# Patient Record
Sex: Male | Born: 1935 | Race: White | Hispanic: No | Smoking: Current every day smoker
Health system: Southern US, Community
[De-identification: ages and names within clinical notes are randomized; demographics above are authoritative.]

## PROBLEM LIST (undated history)

## (undated) DIAGNOSIS — I10 Essential (primary) hypertension: Secondary | ICD-10-CM

## (undated) DIAGNOSIS — R0602 Shortness of breath: Secondary | ICD-10-CM

## (undated) DIAGNOSIS — I2510 Atherosclerotic heart disease of native coronary artery without angina pectoris: Secondary | ICD-10-CM

## (undated) DIAGNOSIS — I82409 Acute embolism and thrombosis of unspecified deep veins of unspecified lower extremity: Secondary | ICD-10-CM

## (undated) DIAGNOSIS — E785 Hyperlipidemia, unspecified: Secondary | ICD-10-CM

## (undated) DIAGNOSIS — K219 Gastro-esophageal reflux disease without esophagitis: Secondary | ICD-10-CM

## (undated) DIAGNOSIS — N189 Chronic kidney disease, unspecified: Secondary | ICD-10-CM

## (undated) DIAGNOSIS — I4892 Unspecified atrial flutter: Secondary | ICD-10-CM

## (undated) HISTORY — PX: SECONDARY CLOSURE ARM: SHX5151

## (undated) HISTORY — DX: Acute embolism and thrombosis of unspecified deep veins of unspecified lower extremity: I82.409

## (undated) HISTORY — PX: CORONARY ARTERY BYPASS GRAFT: SHX141

## (undated) HISTORY — DX: Unspecified atrial flutter: I48.92

## (undated) HISTORY — PX: OTHER SURGICAL HISTORY: SHX169

## (undated) HISTORY — PX: CARDIAC CATHETERIZATION: SHX172

## (undated) HISTORY — DX: Atherosclerotic heart disease of native coronary artery without angina pectoris: I25.10

## (undated) HISTORY — DX: Essential (primary) hypertension: I10

## (undated) HISTORY — DX: Hyperlipidemia, unspecified: E78.5

---

## 1998-08-22 ENCOUNTER — Ambulatory Visit (HOSPITAL_BASED_OUTPATIENT_CLINIC_OR_DEPARTMENT_OTHER): Admission: RE | Admit: 1998-08-22 | Discharge: 1998-08-22 | Payer: Self-pay

## 2000-12-18 ENCOUNTER — Ambulatory Visit (HOSPITAL_COMMUNITY): Admission: RE | Admit: 2000-12-18 | Discharge: 2000-12-18 | Payer: Self-pay | Admitting: Gastroenterology

## 2000-12-18 ENCOUNTER — Encounter: Payer: Self-pay | Admitting: Gastroenterology

## 2001-01-01 ENCOUNTER — Ambulatory Visit (HOSPITAL_COMMUNITY): Admission: RE | Admit: 2001-01-01 | Discharge: 2001-01-01 | Payer: Self-pay | Admitting: Gastroenterology

## 2001-01-01 ENCOUNTER — Encounter: Payer: Self-pay | Admitting: Gastroenterology

## 2001-11-09 ENCOUNTER — Encounter: Admission: RE | Admit: 2001-11-09 | Discharge: 2001-11-09 | Payer: Self-pay | Admitting: Orthopaedic Surgery

## 2001-11-09 ENCOUNTER — Encounter: Payer: Self-pay | Admitting: Orthopaedic Surgery

## 2001-11-10 ENCOUNTER — Ambulatory Visit (HOSPITAL_BASED_OUTPATIENT_CLINIC_OR_DEPARTMENT_OTHER): Admission: RE | Admit: 2001-11-10 | Discharge: 2001-11-10 | Payer: Self-pay | Admitting: Orthopaedic Surgery

## 2003-07-07 ENCOUNTER — Ambulatory Visit (HOSPITAL_COMMUNITY): Admission: RE | Admit: 2003-07-07 | Discharge: 2003-07-07 | Payer: Self-pay | Admitting: Gastroenterology

## 2004-01-16 ENCOUNTER — Encounter: Admission: RE | Admit: 2004-01-16 | Discharge: 2004-01-16 | Payer: Self-pay | Admitting: Family Medicine

## 2004-03-21 ENCOUNTER — Ambulatory Visit (HOSPITAL_COMMUNITY): Admission: RE | Admit: 2004-03-21 | Discharge: 2004-03-21 | Payer: Self-pay | Admitting: Gastroenterology

## 2007-01-07 ENCOUNTER — Encounter: Admission: RE | Admit: 2007-01-07 | Discharge: 2007-01-07 | Payer: Self-pay | Admitting: Family Medicine

## 2007-04-20 ENCOUNTER — Inpatient Hospital Stay (HOSPITAL_COMMUNITY): Admission: EM | Admit: 2007-04-20 | Discharge: 2007-04-22 | Payer: Self-pay | Admitting: Emergency Medicine

## 2007-04-20 ENCOUNTER — Ambulatory Visit: Payer: Self-pay | Admitting: Cardiology

## 2007-05-13 ENCOUNTER — Ambulatory Visit: Payer: Self-pay | Admitting: Cardiology

## 2007-05-26 ENCOUNTER — Ambulatory Visit: Payer: Self-pay | Admitting: Cardiology

## 2007-05-26 LAB — CONVERTED CEMR LAB
AST: 22 units/L (ref 0–37)
Albumin: 3.3 g/dL — ABNORMAL LOW (ref 3.5–5.2)
Alkaline Phosphatase: 60 units/L (ref 39–117)
Bilirubin, Direct: 0.1 mg/dL (ref 0.0–0.3)
HDL: 31.3 mg/dL — ABNORMAL LOW (ref >39.0)
Total Bilirubin: 0.5 mg/dL (ref 0.3–1.2)
Total Protein: 7.8 g/dL (ref 6.0–8.3)
Triglycerides: 57 mg/dL (ref 0–149)

## 2007-08-12 ENCOUNTER — Ambulatory Visit: Payer: Self-pay | Admitting: Cardiology

## 2007-08-12 LAB — CONVERTED CEMR LAB
Bilirubin, Direct: 0.1 mg/dL (ref 0.0–0.3)
HDL: 29.4 mg/dL — ABNORMAL LOW (ref >39.0)
Total CHOL/HDL Ratio: 4.7
Total Protein: 7.7 g/dL (ref 6.0–8.3)
Triglycerides: 69 mg/dL (ref 0–149)

## 2007-09-30 ENCOUNTER — Ambulatory Visit: Payer: Self-pay | Admitting: Cardiology

## 2007-09-30 LAB — CONVERTED CEMR LAB
ALT: 13 units/L (ref 0–53)
AST: 21 units/L (ref 0–37)
Albumin: 3.6 g/dL (ref 3.5–5.2)
Bilirubin, Direct: 0.1 mg/dL (ref 0.0–0.3)
Cholesterol: 106 mg/dL (ref 0–200)
LDL Cholesterol: 60 mg/dL (ref 0–99)
Total CHOL/HDL Ratio: 3.4
Total Protein: 7.6 g/dL (ref 6.0–8.3)

## 2007-11-11 ENCOUNTER — Ambulatory Visit: Payer: Self-pay | Admitting: Cardiology

## 2007-11-12 ENCOUNTER — Ambulatory Visit: Admission: RE | Admit: 2007-11-12 | Discharge: 2007-11-12 | Payer: Self-pay | Admitting: Cardiology

## 2007-11-12 ENCOUNTER — Ambulatory Visit: Payer: Self-pay

## 2007-11-12 ENCOUNTER — Ambulatory Visit: Payer: Self-pay | Admitting: Internal Medicine

## 2007-11-13 ENCOUNTER — Ambulatory Visit: Payer: Self-pay | Admitting: Cardiology

## 2007-11-13 LAB — CONVERTED CEMR LAB
BUN: 13 mg/dL (ref 6–23)
CO2: 25 meq/L (ref 19–32)
Creatinine, Ser: 1 mg/dL (ref 0.4–1.5)
Eosinophils Relative: 2.5 % (ref 0.0–5.0)
GFR calc Af Amer: 95 mL/min
HCT: 41.8 % (ref 39.0–52.0)
Lymphocytes Relative: 30.9 % (ref 12.0–46.0)
MCV: 89.8 fL (ref 78.0–100.0)
Potassium: 4 meq/L (ref 3.5–5.1)
RDW: 14.7 % — ABNORMAL HIGH (ref 11.5–14.6)
Sodium: 141 meq/L (ref 135–145)
aPTT: 27.3 s (ref 21.7–29.8)

## 2007-11-18 ENCOUNTER — Ambulatory Visit (HOSPITAL_COMMUNITY): Admission: RE | Admit: 2007-11-18 | Discharge: 2007-11-19 | Payer: Self-pay | Admitting: Internal Medicine

## 2007-11-18 ENCOUNTER — Ambulatory Visit: Payer: Self-pay | Admitting: Internal Medicine

## 2007-11-27 ENCOUNTER — Ambulatory Visit: Payer: Self-pay

## 2007-12-03 ENCOUNTER — Ambulatory Visit: Payer: Self-pay | Admitting: Cardiology

## 2007-12-04 ENCOUNTER — Ambulatory Visit: Payer: Self-pay

## 2007-12-04 ENCOUNTER — Ambulatory Visit: Payer: Self-pay | Admitting: Cardiology

## 2007-12-04 LAB — CONVERTED CEMR LAB
ALT: 11 U/L
AST: 17 U/L
Albumin: 3.6 g/dL
Alkaline Phosphatase: 53 U/L
Bilirubin, Direct: 0.1 mg/dL
Cholesterol: 122 mg/dL
HDL: 30.7 mg/dL — ABNORMAL LOW
LDL Cholesterol: 74 mg/dL
TSH: 1.4 u[IU]/mL
Total Bilirubin: 1 mg/dL
Total CHOL/HDL Ratio: 4
Total Protein: 7.5 g/dL
Triglycerides: 87 mg/dL
VLDL: 17 mg/dL

## 2008-08-22 ENCOUNTER — Ambulatory Visit: Payer: Self-pay | Admitting: Cardiology

## 2008-08-24 ENCOUNTER — Ambulatory Visit: Payer: Self-pay | Admitting: Cardiology

## 2008-08-24 LAB — CONVERTED CEMR LAB
AST: 19 units/L (ref 0–37)
Alkaline Phosphatase: 61 units/L (ref 39–117)
Cholesterol: 131 mg/dL (ref 0–200)
LDL Cholesterol: 88 mg/dL (ref 0–99)
Total Bilirubin: 0.8 mg/dL (ref 0.3–1.2)
VLDL: 17 mg/dL (ref 0–40)

## 2008-10-12 ENCOUNTER — Ambulatory Visit: Payer: Self-pay | Admitting: Cardiology

## 2008-10-12 LAB — CONVERTED CEMR LAB
ALT: 14 units/L (ref 0–53)
AST: 19 units/L (ref 0–37)
Alkaline Phosphatase: 61 units/L (ref 39–117)
Bilirubin, Direct: 0.1 mg/dL (ref 0.0–0.3)
Cholesterol: 118 mg/dL (ref 0–200)
Total Bilirubin: 0.9 mg/dL (ref 0.3–1.2)
Total CHOL/HDL Ratio: 4
Total Protein: 7.6 g/dL (ref 6.0–8.3)
Triglycerides: 73 mg/dL (ref 0.0–149.0)
VLDL: 14.6 mg/dL (ref 0.0–40.0)

## 2008-10-17 ENCOUNTER — Encounter (INDEPENDENT_AMBULATORY_CARE_PROVIDER_SITE_OTHER): Payer: Self-pay | Admitting: *Deleted

## 2008-11-19 DIAGNOSIS — I2510 Atherosclerotic heart disease of native coronary artery without angina pectoris: Secondary | ICD-10-CM

## 2008-11-19 DIAGNOSIS — I10 Essential (primary) hypertension: Secondary | ICD-10-CM | POA: Insufficient documentation

## 2008-11-19 DIAGNOSIS — E785 Hyperlipidemia, unspecified: Secondary | ICD-10-CM | POA: Insufficient documentation

## 2008-11-19 DIAGNOSIS — I82409 Acute embolism and thrombosis of unspecified deep veins of unspecified lower extremity: Secondary | ICD-10-CM | POA: Insufficient documentation

## 2008-11-19 DIAGNOSIS — I4892 Unspecified atrial flutter: Secondary | ICD-10-CM | POA: Insufficient documentation

## 2008-11-19 HISTORY — DX: Atherosclerotic heart disease of native coronary artery without angina pectoris: I25.10

## 2009-02-14 ENCOUNTER — Encounter: Admission: RE | Admit: 2009-02-14 | Discharge: 2009-02-14 | Payer: Self-pay | Admitting: Internal Medicine

## 2009-07-14 ENCOUNTER — Encounter: Admission: RE | Admit: 2009-07-14 | Discharge: 2009-07-14 | Payer: Self-pay | Admitting: Internal Medicine

## 2010-01-11 ENCOUNTER — Ambulatory Visit: Payer: Self-pay | Admitting: Cardiology

## 2010-01-11 DIAGNOSIS — F17210 Nicotine dependence, cigarettes, uncomplicated: Secondary | ICD-10-CM | POA: Insufficient documentation

## 2010-01-11 DIAGNOSIS — R0989 Other specified symptoms and signs involving the circulatory and respiratory systems: Secondary | ICD-10-CM | POA: Insufficient documentation

## 2010-02-07 ENCOUNTER — Ambulatory Visit: Payer: Self-pay

## 2010-02-07 ENCOUNTER — Ambulatory Visit: Payer: Self-pay | Admitting: Cardiology

## 2010-04-23 ENCOUNTER — Encounter: Admission: RE | Admit: 2010-04-23 | Discharge: 2010-04-23 | Payer: Self-pay | Admitting: Internal Medicine

## 2010-07-14 ENCOUNTER — Encounter: Payer: Self-pay | Admitting: Gastroenterology

## 2010-07-22 LAB — CONVERTED CEMR LAB
Albumin: 3.7 g/dL (ref 3.5–5.2)
BUN: 12 mg/dL (ref 6–23)
Bilirubin, Direct: 0.1 mg/dL (ref 0.0–0.3)
Chloride: 104 meq/L (ref 96–112)
Cholesterol: 207 mg/dL — ABNORMAL HIGH (ref 0–200)
Creatinine, Ser: 1.1 mg/dL (ref 0.4–1.5)
Direct LDL: 168 mg/dL
GFR calc non Af Amer: 67.56 mL/min (ref >60)
Total CHOL/HDL Ratio: 7
Triglycerides: 109 mg/dL (ref 0.0–149.0)
VLDL: 21.8 mg/dL (ref 0.0–40.0)

## 2010-07-24 NOTE — Assessment & Plan Note (Signed)
Summary: rov/ gd  Medications Added ASPIRIN 325 MG  TABS (ASPIRIN) 1 tab by mouth once daily OMEPRAZOLE 20 MG CPDR (OMEPRAZOLE) 1 tab by mouth once daily NIASPAN 1000 MG CR-TABS (NIACIN (ANTIHYPERLIPIDEMIC)) 1 tab  by mouth once daily SIMVASTATIN 40 MG TABS (SIMVASTATIN) Take one tablet by mouth daily at bedtime        CC:  pt complains of pain on his side which radiates to his neck...pt didnot bring med list.  History of Present Illness: [name] is a pleasant  gentleman with a history of coronary artery disease, status post coronary bypassing graft.  This was performed in 1998.  His last catheterization in October 2008 showed a patent LIMA to the LAD and a patent saphenous vein graft to the right coronary artery.  However, he did have significant left main disease that bifurcated into the circumflex.  He had a drug-eluting stent to his left main at that time. His last Myoview was performed on November 27, 2007.  At that time, his ejection fraction was 54% and there was no scar or ischemia.  He also has a history of SVT and atrial flutter ablation.Since I last saw him March 2010, the patient has dyspnea with more extreme activities but not with routine activities. It is relieved with rest. It is not associated with chest pain. There is no orthopnea, PND or pedal edema. There is no syncope or palpitations. There is no exertional chest pain.   Current Medications (verified): 1)  Metoprolol Succinate 25 Mg Xr24h-Tab (Metoprolol Succinate) .... 1/2 Tab Once Daily 2)  Plavix 75 Mg Tabs (Clopidogrel Bisulfate) .... Take One Tablet By Mouth Daily 3)  Aspirin 325 Mg  Tabs (Aspirin) .Marland Kitchen.. 1 Tab By Mouth Once Daily 4)  Omeprazole 20 Mg Cpdr (Omeprazole) .Marland Kitchen.. 1 Tab By Mouth Once Daily 5)  Niaspan 1000 Mg Cr-Tabs (Niacin (Antihyperlipidemic)) .Marland Kitchen.. 1 Tab  By Mouth Once Daily 6)  Simvastatin 40 Mg Tabs (Simvastatin) .... Take One Tablet By Mouth Daily At Bedtime  Past History:  Past Medical  History: ATRIAL FLUTTER (ICD-427.32) and SVT that is post ablation DVT (ICD-453.40) DYSLIPIDEMIA (ICD-272.4) CAD (ICD-414.00) HYPERTENSION (ICD-401.9)    Past Surgical History: Reviewed history from 11/19/2008 and no changes required.  Status post cardiac catheterization and bypass   surgery as well as EGD with dilatation x3, left lower extremity vein   stripping and left knee arthroscopy.      Social History: Reviewed history from 11/19/2008 and no changes required.  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  He has a greater than 50 pack-year history of tobacco use   and denies alcohol or drug abuse.      Review of Systems       no fevers or chills, productive cough, hemoptysis, dysphasia, odynophagia, melena, hematochezia, dysuria, hematuria, rash, seizure activity, orthopnea, PND, pedal edema, claudication. Remaining systems are negative.   Vital Signs:  Patient profile:   76 year old male Height:      62 inches Weight:      184 pounds BMI:     33.78 Pulse rate:   67 / minute Resp:     14 per minute BP sitting:   128 / 64  (left arm)  Vitals Entered By: Kem Parkinson (January 11, 2010 9:33 AM)  Physical Exam  General:  Well-developed well-nourished in no acute distress.  Skin  is warm and dry.  HEENT is normal.  Neck is supple. No thyromegaly.  Chest is clear to auscultation with normal expansion.  Cardiovascular exam is regular rate and rhythm.  Abdominal exam nontender or distended. No masses palpated. positive bruit Extremities show no edema. neuro grossly intact    EKG  Procedure date:  01/11/2010  Findings:      Sinus rhythm at a rate of 67. Axis normal. No ST changes.  Impression & Recommendations:  Problem # 1:  ATRIAL FLUTTER (ICD-427.32)  Status post ablation. Note SVT has been ablated previously as well. His updated medication list for this  problem includes:    Metoprolol Succinate 25 Mg Xr24h-tab (Metoprolol succinate) .Marland Kitchen... 1/2 tab once daily    Plavix 75 Mg Tabs (Clopidogrel bisulfate) .Marland Kitchen... Take one tablet by mouth daily    Aspirin 325 Mg Tabs (Aspirin) .Marland Kitchen... 1 tab by mouth once daily  His updated medication list for this problem includes:    Metoprolol Succinate 25 Mg Xr24h-tab (Metoprolol succinate) .Marland Kitchen... 1/2 tab once daily    Plavix 75 Mg Tabs (Clopidogrel bisulfate) .Marland Kitchen... Take one tablet by mouth daily    Aspirin 325 Mg Tabs (Aspirin) .Marland Kitchen... 1 tab by mouth once daily  Problem # 2:  DYSLIPIDEMIA (ICD-272.4)  Continue present medications. Check lipids and liver. His updated medication list for this problem includes:    Niaspan 1000 Mg Cr-tabs (Niacin (antihyperlipidemic)) .Marland Kitchen... 1 tab  by mouth once daily    Simvastatin 40 Mg Tabs (Simvastatin) .Marland Kitchen... Take one tablet by mouth daily at bedtime  His updated medication list for this problem includes:    Niaspan 1000 Mg Cr-tabs (Niacin (antihyperlipidemic)) .Marland Kitchen... 1 tab  by mouth once daily    Simvastatin 40 Mg Tabs (Simvastatin) .Marland Kitchen... Take one tablet by mouth daily at bedtime  Problem # 3:  CAD (ICD-414.00)  Continue aspirin, Plavix, beta blocker and statin. His updated medication list for this problem includes:    Metoprolol Succinate 25 Mg Xr24h-tab (Metoprolol succinate) .Marland Kitchen... 1/2 tab once daily    Plavix 75 Mg Tabs (Clopidogrel bisulfate) .Marland Kitchen... Take one tablet by mouth daily    Aspirin 325 Mg Tabs (Aspirin) .Marland Kitchen... 1 tab by mouth once daily  His updated medication list for this problem includes:    Metoprolol Succinate 25 Mg Xr24h-tab (Metoprolol succinate) .Marland Kitchen... 1/2 tab once daily    Plavix 75 Mg Tabs (Clopidogrel bisulfate) .Marland Kitchen... Take one tablet by mouth daily    Aspirin 325 Mg Tabs (Aspirin) .Marland Kitchen... 1 tab by mouth once daily  Orders: EKG w/ Interpretation (93000) Abdominal Aorta Duplex (Abd Aorta Duplex)  Problem # 4:  HYPERTENSION (ICD-401.9)  Blood pressure  controlled on present medications. Will continue. Check potassium and renal function. His updated medication list for this problem includes:    Metoprolol Succinate 25 Mg Xr24h-tab (Metoprolol succinate) .Marland Kitchen... 1/2 tab once daily    Aspirin 325 Mg Tabs (Aspirin) .Marland Kitchen... 1 tab by mouth once daily  His updated medication list for this problem includes:    Metoprolol Succinate 25 Mg Xr24h-tab (Metoprolol succinate) .Marland Kitchen... 1/2 tab once daily    Aspirin 325 Mg Tabs (Aspirin) .Marland Kitchen... 1 tab by mouth once daily  Orders: EKG w/ Interpretation (93000)  Problem # 5:  ABDOMINAL BRUIT (ICD-785.9)  Check abdominal ultrasound to exclude aneurysm.  Orders: Abdominal Aorta Duplex (Abd Aorta Duplex)  Problem # 6:  TOBACCO ABUSE (ICD-305.1) Patient counseled on discontinuing.  Patient Instructions: 1)  Your physician recommends that you schedule a follow-up  appointment in: 1 year with Dr. Jens Som 2)  Your physician recommends that you return for a FASTING lipid profile, liver, and BMET     414.01, 272.0, 401.1 SAME DAY AS YOUR ABDOMINAL US 3)  Your physician has requested that you have an abdominal aorta duplex. During this test, an ultrasound is used to evaluate the aorta. Allow 30 minutes for this exam. Do not eat after midnight the day before and avoid carbonated beverages. There are no restrictions or special instructions. 4)  Your physician recommends that you continue on your current medications as directed. Please refer to the Current Medication list given to you today.

## 2010-11-06 NOTE — H&P (Signed)
NAME:  TAIWO, FISH NO.:  [id number]   MEDICAL RECORD NO.:  [id number]          PATIENT TYPE:  OIB   LOCATION:  2899                         FACILITY:  [facility]   PHYSICIAN:  Duke Salvia, MD, FACCDATE OF BIRTH:  03/04/1935   DATE OF ADMISSION:  11/12/2007  DATE OF DISCHARGE:                              HISTORY & PHYSICAL   [name] is a 76 year old gentleman who is seen by Dr. Jens Som.  He  has ischemic heart disease with prior bypass surgery and underwent  catheterization in 2008.  At that time, his vein graft to his RCA was  patent and LIMA to his LAD was patent.  His ejection fraction was  normal.  A drug-eluting stent was placed in his high-grade left main to  presumably improve circumflex flow.   He was having problems with chest pain and was scheduled for Myoview  scanning today.  On arrival, he was found to be in atrial flutter with  2:1 conduction at a rate of 145-150 beats per minute.  It was without  associated palpitations; he did note some increasing shortness of  breath.   He has a history of resting bradycardia.   MEDICATIONS:  1. Toprol 12.5.  2. Aspirin 325.  3. Zocor.  4. Plavix.  5. Niaspan.  6. Omeprazole.   ALLERGIES:  He has no known drug allergies.   SOCIAL HISTORY:  He is married.  He continues to smoke (some).  He does  not use alcohol.  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   CURRENT MEDICATIONS:  As listed previously.   PHYSICAL EXAMINATION:  GENERAL:  He is an elderly Caucasian male  appearing his stated age of 83.  VITAL SIGNS:  His blood pressure was 110/60, his pulse was 157.  HEENT:  Demonstrated no icterus, no xanthoma.  NECK:  His neck veins were flat.  The carotids were brisk.  BACK:  Without kyphosis or scoliosis.  LUNGS:  Clear.  HEART:  Sounds are regular, rapid.  ABDOMEN:  Soft.  EXTREMITIES: No edema, as best as I can tell through his boots.  NEUROLOGIC:  Grossly normal.  SKIN:  Warm and  dry.   LABORATORY DATA:  Laboratories drawn yesterday demonstrate normal CK,  normal troponin.   Electrocardiogram was notable for atrial flutter that appears to be  typical with a RR intervals of just under 400 milliseconds translated  into a rate of 153 and an atrial rate of about 310.   IMPRESSION:  1. Atrial flutter - typical without palpitations but with shortness of      breath.  2. Episodic shortness of breath, question related to atrial flutter.  3. Thromboembolic risk factors notable for hypertension.  4. Coronary artery disease.      a.     Status post coronary artery bypass graft and the left main       stenting.      b.     Normal ejection fraction.      c.     Recurrent chest pain.  Myoview scanning scheduled for today  which is to be postponed.  5. Mild bradycardia.   Mr. Huberty has atrial flutter which may be aggravating shortness of  breath, is new in onset, and may well terminate spontaneously.  However,  given the window of opportunity that we have knowing that he was in  sinus rhythm yesterday bags the opportunity for cardioversion.   Given that atrial flutter is typically recurrent at a very high  likelihood and at relatively low risk for catheter ablation,  thromboembolic risks are probably diminished significantly even in a  patient with a CHADS score of 0.  I have reviewed this with Mr. Cozart  briefly.  We will plan to send him to hospital for cardioversion today  probable discharge and then followup in anticipation of catheter  ablation.      Duke Salvia, MD, [facility]  Electronically Signed     SCK/MEDQ  D:  11/12/2007  T:  11/12/2007  Job:  [id number]

## 2010-11-06 NOTE — Assessment & Plan Note (Signed)
[facility]                            CARDIOLOGY OFFICE NOTE   NAME:[name], [name]                     MRN:          [id number]  DATE:05/13/2007                            DOB:          27-Nov-1934    [name] is a 76 year old gentleman with a past medical history of  coronary artery disease, status post coronary artery bypassing graft,  who was recently admitted to [facility] with non-ST segment  elevation myocardial infarction.  The patient underwent cardiac  catheterization and was found to have significant three-vessel coronary  disease.  The LIMA to the LAD was patent.  The saphenous vein graft to  the right coronary artery was patent.  However, he did have a 99% distal  left main that extended into the circumflex.  The patient had a drug-  eluting stent to his left main at that time.  Note the patient had a  normal ejection fraction.  Since discharge, he has not had any  significant dyspnea on exertion, orthopnea, PND, pedal edema,  palpitations, syncope, or exertional chest pain.  He occasionally has a  soreness in his epigastric area that is unlikely his infarct pain and  nonexertional.  This does not occur after eating.   MEDICATIONS:  1. Aspirin 325 mg p.o. daily.  2. Omeprazole 20 mg p.o. daily.  3. Zocor 40 mg p.o. at bedtime.  4. Toprol 12.5 mg p.o. daily.  5. Pepcid.  6. Plavix 75 mg p.o. daily.   PHYSICAL EXAMINATION:  VITAL SIGNS:  Blood pressure 120/70, pulse 61.  He weighs 181 pounds.  HEENT:  Normal.  NECK:  Supple.  CHEST:  Clear.  CARDIOVASCULAR:  Reveals an regular rate and rhythm.  ABDOMEN:  Shows no pulsatile masses.  EXTREMITIES:  Showed no edema.  His right groin shows no hematoma, no  bruit.   Electrocardiogram shows a sinus rhythm.  There is no RV conduction  delay, but there are no ST changes.   DIAGNOSES:  1. Coronary artery disease, status post coronary artery bypassing      graft.  The patient  had recent drug-eluting stent to his left main.      He will continue on aspirin and Plavix indefinitely.  I will also      continue him on his low-dose Toprol and statin.  2. Tobacco abuse.  We discussed the importance of discontinuing this.  3. Hyperlipidemia.  His Zocor was started in the hospital, and we will      check lipids and liver in 4 weeks and adjust, with a goal LDL of      less than 70.  4. Hypertension.  His blood pressure is adequately controlled on his      present medications.  5. History of gastroesophageal reflux disease.  6. The patient had an MRI of his abdomen approximately 6 months ago.      We will obtain this for our records to make sure that he has had no      history of an abdominal aortic aneurysm.  7. History of deep venous thrombosis, treated with Coumadin  in the      past.   We will see the patient back in 6 months.     Madolyn Frieze Jens Som, MD, [facility]  Electronically Signed    BSC/MedQ  DD: 05/13/2007  DT: 05/14/2007  Job #: [id number]

## 2010-11-06 NOTE — Cardiovascular Report (Signed)
NAME:  [name], [name] NO.:  [id number]   MEDICAL RECORD NO.:  [id number]          PATIENT TYPE:  INP   LOCATION:  2807                         FACILITY:  [facility]   PHYSICIAN:  Veverly Fells. Excell Seltzer, MD  DATE OF BIRTH:  [birth year]   DATE OF PROCEDURE:  04/21/2007  DATE OF DISCHARGE:                            CARDIAC CATHETERIZATION   PROCEDURE:  1. Left heart catheterization.  2. Selective coronary angiography.  3. Saphenous vein graft angiography.  4. Left internal mammary artery angiography,  5. Percutaneous transluminal cardiac angioplasty and stenting of the      left mainstem.  6. StarClose of the right femoral artery.   INDICATIONS:  [name] is a 76 year old gentleman who presented with  an acute coronary syndrome.  He has ruled in for myocardial infarction  with positive troponins.  He had become pain free on heparin.  He has  known coronary artery disease and prior bypass surgery with a LIMA to  LAD and saphenous vein graft to the distal right coronary artery in  1998.  He was referred for cardiac catheterization in that setting.   Risks and indications of the procedure were reviewed with the patient.  Informed consent was obtained.  Right groin was prepped, draped,  anesthetized with 1% lidocaine. Using the modified Seldinger technique,  a 6-French sheath was placed in the right femoral artery.  Standard 6-  French Judkins catheters were used for selective coronary angiography,  and a JR-4 catheter was used to image the saphenous vein graft as well  as the left internal mammary artery.  Following native and bypass graft  angiography, an angled pigtail catheter was inserted into the left  ventricle where pressures were recorded, left ventriculogram was  performed, and a pullback across the aortic valve was done.   At completion of diagnostic procedure, I elected to intervene on the  left mainstem.  There was a 99% distal left main stenosis leading  into  the left circumflex.  The left circumflex was ungrafted.  Angiomax was  used for anticoagulation.  The patient had been preloaded with  clopidogrel yesterday.  A BMW guidewire was used to pass beyond the  lesion into the OM branch.  A 6-French XB 3.5 guide catheter was used.  A 2.0 x 15 mm Maverick balloon was inflated to 10 atmospheres on two  inflations and expanded well.  Following balloon dilatation,  intracoronary nitroglycerin was given, and the vessel appeared to be  approximately 2.25 mm in diameter.  I elected the use a 2.25 x 20 mm  TAXUS Atom stent which was carefully placed across the left main into  the circumflex and was deployed at 14 atmospheres.  The stent was well  expanded, and there was TIMI-3 flow following stenting.  I elected to  post dilate with a 2.5 x 15 mm Quantum Maverick balloon which was taken  to 16 atmospheres in the distal aspect of the stent and 20 atmospheres  in the proximal aspect of stent.  The stent was well expanded with TIMI-  3 flow throughout the vessel after stenting.  At the  completion of the  procedure, a StarClose device was used to seal the femoral arteriotomy.  All catheter exchanges were performed over a guidewire.  There were no  immediate complications.   FINDINGS:  Aortic pressure 127/55 with a mean of 82. Left ventricular  pressure was 124/15.   The left mainstem has a severe 99% distal stenosis.  It bifurcates into  the LAD and left circumflex.   The LAD is occluded in its proximal portion.  There is no antegrade flow  seen beyond the very proximal aspect of the LAD.   The left circumflex essentially comprises only an OM branch.  The OM  courses down and bifurcates into twin vessels. One of the vessels is  very small, but the other vessel was of medium caliber.  There appears  to be an intermediate branch that is occluded as well.  The intermediate  branch is occluded in its proximal aspect.   The right coronary artery is  severely diseased and is heavily calcified  throughout.  There is a 99% eccentric stenosis in the mid portion.  Distally, there are serial 80% lesions.  There is competitive native  vessel and graft flow into the branch vessels of the right coronary  artery.   Saphenous vein graft to the distal right coronary artery is widely  patent.  This supplies an acute marginal branch, PDA branch and two  posterolateral branches.  The saphenous vein graft is widely patent  throughout with no significant stenoses.  The branch vessels, including  the acute marginal branch and PDA, have minor nonobstructive disease in  the range of 40-50%.  There are no high-grade stenoses seen.   The LIMA to LAD is widely patent.  The LAD is a medium-caliber vessel  that reaches and wraps around the LV apex.  The LIMA retrograde fills  the proximal LAD which supplies a large diagonal branch in retrograde  fashion.   Left ventricular function assessed by 30-degree RAO left  ventriculography is normal.  The LVEF is 65%.  There is no mitral  regurgitation.   ASSESSMENT:  1. Severe native three-vessel coronary artery disease.  2. Patent saphenous vein graft to the right coronary artery.  3. Patent left internal mammary artery to left anterior descending.  4. Successful percutaneous coronary intervention of the left main into      the left circumflex (protected left main).   DISCUSSION:  [name] culprit appeared to be the distal left  mainstem.  His LAD and right coronary artery had widely patent bypass  grafts supplying those regions.  The left circumflex was ungrafted as  this severe left mainstem stenosis was not described back in 1998.  I  elected to use a drug-eluting stent to treat this area, and the patient  had a good angiographic result.  He should receive a minimum of 12  months of dual antiplatelet therapy with aspirin and Plavix.      Veverly Fells. Excell Seltzer, MD  Electronically Signed      MDC/MEDQ  D:  04/21/2007  T:  04/21/2007  Job:  [id number]   cc:   Madolyn Frieze. Jens Som, MD, [facility]  Elvina Sidle, M.D.

## 2010-11-06 NOTE — Discharge Summary (Signed)
NAME:  [name], [name] NO.:  [id number]   MEDICAL RECORD NO.:  [id number]          PATIENT TYPE:  INP   LOCATION:  3734                         FACILITY:  [facility]   PHYSICIAN:  Madolyn Frieze. Jens Som, MD, FACCDATE OF BIRTH:  1934-11-11   DATE OF ADMISSION:  11/18/2007  DATE OF DISCHARGE:  11/19/2007                               DISCHARGE SUMMARY   This patient has no known drug allergies.   DICTATION TIME:  Greater than 35 minutes.   FINAL DIAGNOSES:  1. Discharging day #1 status post electrophysiology study,      radiofrequency catheter ablation of 2 atrial arrhythmias.  2. Atrioventricular nodal reentry tachycardia.  3. Typical atrial flutter.  4. Dual atrial arrhythmias.  The patient is short of breath with      paroxysmal atrial flutter.   SECONDARY DIAGNOSES:  1. Hypertension.  2. History of 3-vessel coronary artery disease, status post coronary      artery bypass graft surgery in 1998.  3. Admitted with acute myocardial infarction in October 2008, stent      placed in the left main, ejection fraction 65% at catheterization.  4. Gastroesophageal reflux disease.  5. Dyslipidemia.  6. History of deep venous thrombosis, on Coumadin therapy in the past.   PROCEDURES:  On Nov 18, 2007, electrophysiology study, radiofrequency  catheter ablation of an atrioventricular nodal reentry tachycardia, and  also concurrent atrial flutter, also ablated by Dr. Sherryl Manges.  The  patient had no post-procedural complications discharging #1.   BRIEF HISTORY:  [name] is a 76 year old male.  He presented to the  office of [facility] on Nov 12, 2007, for a stress Myoview  study.  It was found that he was in atrial flutter.  He went to the  hospital and converted spontaneously into sinus rhythm.  He returned to  the office to complete his Myoview study, but was found to be  tachycardiac again.  Carotid massage resulted in termination of the  tachycardia.   Examination of the electrocardiogram shows that there are  actually 2 potential atrial arrhythmias for this patient.  The role of  carotid massage and Valsalva maneuvers was discussed with the patient  and he was sent home to come back to [facility] electively for  electrophysiology study, radiofrequency catheter ablation of both of  these arrhythmias, could be done on Nov 18, 2007.   HOSPITAL COURSE:  The patient presents electively on Nov 18, 2007.  He  had electrophysiology study and successful radio catheter ablation of  both reentry atrial tachycardia and atrial flutter tachycardia.  The  patient's symptoms with these atrial arrhythmias is mainly shortness of  breath.  He does not have palpitation or chest pain.  The patient has  done well on the postoperative period and discharging day #1.  The plan  will be for him to have a repeat of the Myoview study, which was not  completed on Nov 12, 2007.  This will be done on November 27, 2007, at 9:30  and then he will see Dr. Jens Som on the office on December 03, 2007,  at 12  o'clock.  The patient is asked to present for the stress test having  even that morning and to wear comfortable walking shoes.   MEDICATIONS AT DISCHARGE:  1. Toprol 12.5 mg daily, this is 1/2 of a 25-mg tab.  2. Plavix 75 mg daily.  3. Enteric-coated aspirin 325 mg daily.  4. Omeprazole 20 mg daily in the morning  5. Niaspan 1000 mg tabs 2 tabs daily at bedtime.  6. Zocor 40 mg daily at bedtime.  7. Nitroglycerin as needed.   LAB STUDIES:  Pertinent to this admission were drawn on Nov 13, 2007.  Serum electrolytes, sodium 141, potassium 4, chloride 102, carbonate 25,  glucose 96, BUN is 13, and creatinine is 1.  White cells 9.6, hemoglobin  13.4, hematocrit 41.8, and platelets 244.  Protime 13.7. INR 1.2.      Maple Mirza, PA      Madolyn Frieze. Jens Som, MD, [facility]  Electronically Signed    GM/MEDQ  D:  11/19/2007  T:  11/19/2007  Job:  [id number]   cc:   Duke Salvia, MD, [facility]  Madolyn Frieze Jens Som, MD, [facility]  Elvina Sidle, M.D.

## 2010-11-06 NOTE — Assessment & Plan Note (Signed)
[facility]                         ELECTROPHYSIOLOGY OFFICE NOTE   Kristian, Mogg GAGAN DILLION                     MRN:          [id number]  DATE:11/12/2007                            DOB:          11/02/1934    [name] has gotten long and he has made my Estrada long as well.  So, he went over the hospital because of his atrial flutter.  He  converted spontaneously.  Dr. Jens Som send him back over for his  Myoview.  On arrival he was found to be in tachycardia again, however,  this time it was a short RP tachycardia with clear 1:1 VA.  Carotid  massage resulted in termination of tachycardia.   I then had a lengthy discussion with the patient's wife and his daughter  regarding treatment options and the value catheter ablation for  reduction of risk of thromboembolism as well as symptoms, which she has  become more aware of in the last couple of hours.  We were all agreed to  that.  We discussed potential complications including but not limited to  death, perforation, heart block.  They understand this and would like to  proceed.   He then redeveloped SVT which again responded to carotid massage.  However, on this occasion he developed atrial couplets and nonsustained  atrial tachycardia 3-4 beats at most.  From this he went back into short  RP tachycardia which was then again terminated by carotid massage  without recurrence.   I have reviewed with him the role of carotid massage and Valsalva.  We  will see how it is that he does and will plan to see him on Wednesday  next.  We will increase his Toprol today from 25 once to 25 twice a Estrada.     Duke Salvia, MD, [facility]  Electronically Signed    SCK/MedQ  DD: 11/12/2007  DT: 11/12/2007  Job #: [id number]

## 2010-11-06 NOTE — Discharge Summary (Signed)
NAME:  [name], [name] NO.:  [id number]   MEDICAL RECORD NO.:  [id number]          Estrada TYPE:  INP   LOCATION:  6522                         FACILITY:  [facility]   PHYSICIAN:  Madolyn Frieze. Jens Som, MD, FACCDATE OF BIRTH:  December 01, 1934   DATE OF ADMISSION:  04/20/2007  DATE OF DISCHARGE:  04/22/2007                         DISCHARGE SUMMARY - REFERRING   DISCHARGE DIAGNOSES:  1. Non-ST elevated myocardial infarction.  2. Progressive coronary artery disease.  3. Status post drug-eluting stent to Christopher left main.  4. Tobacco use.  5. Hyperlipidemia.  6. History of gastroesophageal reflux disease.   PROCEDURES PERFORMED:  Cardiac catheterization on April 21, 2007 with  protected drug-eluting stent to left main by Dr. Excell Seltzer.   SUMMARY OF HISTORY:  [name] is a 76 year old male who went to see  his primary care physician secondary to left abdominal discomfort  radiating up into Christopher chest and was referred to Christopher emergency room.  Christopher  Estrada was unable to qualify Christopher discomfort and was not sure if he had  associated shortness of breath.  He specifically denied nausea, vomiting  and diaphoresis.  Christopher discomfort did not change with movement, deep  inspiration.  Initially it felt like indigestion with Christopher need to cough.  However, after receiving aspirin and sublingual nitroglycerin Christopher  discomfort was relieved.  All in all Christopher discomfort lasted around 2  hours and was a 10 at its worst.  In Christopher emergency room it was a 1.   PAST MEDICAL HISTORY:  1. Notable for bypass surgery in 1998, specifics unknown.  2. History of tobacco use.  3. GERD.  4. DVT with Coumadin in Christopher past.  5. Bilateral vein stripping.  6. EGD with dilatation.  7. Left knee surgery.  8. Unclear his last stress test or cardiac catheterization.   LABORATORY DATA:  H and H on admission was 12.5 and 37.3, normal  indices, platelets 197, WBCs 9.6.  Prior to discharge on Christopher twenty-  ninth H and H was  11.9, 35.6, normal indices, platelets 232, WBCs 9.9.  PTT 32, PT 13.9.  Sodium 138, potassium 4, BUN 12, creatinine 1.02,  glucose 113, normal LFTs.  Prior to discharge sodium was 138, potassium  2.9, BUN 12, creatinine 1.16, glucose 95.  CK-MB relative indexes were  unremarkable for myocardial infarction and troponins were slightly  abnormal at 0.28, 1.25 and 0.66.  Fasting lipids showed a total  cholesterol 162, triglycerides 117, HDL 22, LDL 117.  TSH 1.637.  Chest  x-ray on Christopher twenty-seventh showed COPD without evidence of acute  pulmonary disease. EKGs showed normal sinus rhythm, normal axis, early R-  wave, nonspecific ST-T wave changes as well sinus bradycardia.   HOSPITAL COURSE:  [name] was admitted to [facility] placed  on his home medications, aspirin and Nexium as well as IV heparin, beta-  blocker and lipid agent were also added.  On April 21, 2007 he had not  had any further symptoms and ruled out for myocardial infarction based  on CK-MBs, however troponins were slightly abnormal.  It was felt that  he should undergo cardiac catheterization.  Catheterization performed on  Christopher twenty-eighth by Dr. Excell Seltzer revealed severe native three-vessel  coronary artery disease.  Christopher LIMA to Christopher LAD was patent.  Christopher saphenous  vein graft to Christopher RCA was patent.  However, he did have a 99% distal  left main that extended into Christopher circumflex.  Dr. Excell Seltzer after review  performed protected PCI to Christopher left main.  He recommended least 12  months aspirin and Plavix.  LV-gram showed EF of 60-65% with normal  function.  Post sheath removal and bedrest [name] was ambulating  without difficulty.  Catheterization site was intact.  By Christopher twenty-  ninth Dr. Jens Som felt that [name] could be discharged home.  His  Coreg was discontinued secondary to bradycardia   DISPOSITION:  [name] is discharged home.  He is asked to maintain  low-sodium heart-healthy diet.  He is  advised no smoking or tobacco  products.  Wound care and activities are per supplemental discharge  sheet.   He was given new prescriptions for:  1. Plavix 75 mg daily.  2. Zocor 40 mg q.h.s.  3. Toprol XL 12.5 mg daily.  4. Nitroglycerin 0.4 as needed.   He was given permission to continue:  1. Nexium 40 mg daily.  2. Aspirin 325 mg daily.   He will need blood work in 6-8 weeks to recheck FLP and LFTs since Zocor  was initiated.  He was also advised to bring all medications to all  appointments.  He will follow up with Dr. Jens Som on May 12, 2008  at 12:30.   DISCHARGE TIME:  45 minutes.      Joellyn Rued, PA-C      Madolyn Frieze Jens Som, MD, [facility]  Electronically Signed    EW/MEDQ  D:  04/22/2007  T:  04/22/2007  Job:  [id number]   cc:   Elvina Sidle, M.D.

## 2010-11-06 NOTE — Assessment & Plan Note (Signed)
[facility]                            CARDIOLOGY OFFICE NOTE   [name], [name]                     MRN:          [id number]  DATE:11/11/2007                            DOB:          1935-04-09    [name] is a 76 year old gentleman who has a history of   CANCELED DICTATION     Madolyn Frieze. Jens Som, MD, [facility]     BSC/MedQ  DD: 11/11/2007  DT: 11/11/2007  Job #: [id number]

## 2010-11-06 NOTE — Op Note (Signed)
NAME:  [name], [name] NO.:  [id number]   MEDICAL RECORD NO.:  [id number]          PATIENT TYPE:  INP   LOCATION:  3734                         FACILITY:  [facility]   PHYSICIAN:  Duke Salvia, MD, FACCDATE OF BIRTH:  Jun 29, 1934   DATE OF PROCEDURE:  DATE OF DISCHARGE:                               OPERATIVE REPORT   PREOPERATIVE DIAGNOSES:  Short RP tachycardia; atrial flutter.   POSTOPERATIVE DIAGNOSES:  Slow-fast atrioventricular nodal re-entrant  tachycardia; atrial flutter.   PROCEDURE:  Invasive electrophysiological study, isoproterenol infusion,  arrhythmia mapping, and catheter ablation of atrioventricular nodal re-  entrant tachycardia; mapping ablation of atrial flutter.   Following obtaining informed consent, the patient was brought to the  electrophysiology laboratory and placed on the fluoroscopic table in  supine position.  After routine prep and drape, the cardiac  catheterization was performed with local anesthesia and conscious  sedation.  Noninvasive blood pressure monitoring, transcutaneous oxygen  saturation monitoring, and end-tidal CO2 monitoring were performed  continuously throughout the procedure.  Following the procedure, the  catheters were removed, hemostasis was obtained, and the patient was  transferred to floor in a stable condition.   CATHETERS:  1. A 5-French quadripolar catheter was inserted via the femoral vein      to the right ventricular apex.  2. A 5-French quadripolar catheter was inserted via the femoral vein      to the atrioventricular junction.  3. A 6-French octapolar catheter was inserted via the right femoral      vein to the coronary sinus.  4. A 7-French  Duo-Decapolar catheter was inserted via the left      femoral vein to the tricuspid annulus.  5. An 8-French 4-mm deflectable tip catheter was inserted via the      right femoral vein to mapping sites in the posterior septal space.  6. An 8-French 8-mm  deflectable tip catheter was inserted via the      right femoral vein to mapping sites in the posterior septal space      with ablation of the atrial flutter.   Surface leads I, aVF, and V1 were monitored continuously throughout the  procedure.  Following insertion of the catheters, stimulation protocol  included:  1. Incremental atrial pacing.  2. Incremental ventricular pacing.  3. Single and double atrial extrastimuli, paced cycle length 600, 500,      and 400 msec in the absence of the presence of isoproterenol.   END-TIDAL RESULTS:  1. End-tidal surface electrocardiogram:      a.     Initial:  Rhythm:  Sinus; RR interval:  294 msec; PR interval 169 msec; QRS  duration:  89 msec; QT interval 410 msec; P-wave duration 119  milliseconds; bundle-branch block:  Absent; pre-excitation:  Absent.  AH interval:  77 msec.  HV interval:  49 msec.  b.  Final:  Rhythm:  Sinus; RR interval 259 msec; PR interval 128 msec; QRS duration  100 msec; QT interval 399 msec; P-wave duration 126 msec; bundle-branch  block:  Absent; pre-excitation:  Absent.  AH interval 71 msec; HV interval:  48 msec.  1. AV nodal function.  In the absence of isoproterenol, AV Wenckebach      was 400 msec, and VA conduction was dissociated at 500 msec.  In      the presence of isoproterenol, 1:1 VA conduction was noted at 350      msec.  2. The AV nodal effective refractory period at a paced cycle length of      600 msec was 320 msec.  Dual-antegrade AV nodal physiology was      identified without echo beats prior to isoproterenol, and with      isoproterenol, echo beats inducible tachycardia were seen.      Retrograde conduction was continuous.   ACCESSORY PATHWAY FUNCTION:  No evidence of accessory pathway.   Ventricular response to programmed stimulation; normal for V-stimulation  as described previously.   ARRHYTHMIAS INDUCED:  AV nodal reentry was reproducibly induced with  atrial double extrastimuli from  the coronary sinus at 400:70:250.   A typical episode of tachycardia.  The VE time was 75 msec.  Earliest  atrial activation was actually in the distal coronary sinus consistent  with a slow-slow AV-nodal reentry tachycardia.  The AH time was 299 msec  and the HA time was 121 msec.   Atrial flutter was also known to be present from the surface  electrocardiogram, and cavotricuspid isthmus ablation was undertaken  with coronary sinus pacing resulting in bidirectional cavotricuspid  isthmus conduction block.   Radiofrequency Energy:  Two different targets for catheter ablation were  undertaken, the first was where the presumed slow pathway potentials  were identified, and this resulted in the elimination of antegrade slow  pathway function.  Through this tachycardia, a total of 123 seconds of  RF energy was applied, where presumed slow pathway potentials were  identified.  With the last 3 applications of energy, junctional rhythm  was seen with the catheter removed posteriorly.  The last application  was we were able to keep the catheter on the ventricular side of the  annulus.  Junctional rhythm was noted, and thereafter no slow pathway  function was identified.   For the atrial flutter, a total of 7 minutes and 11 seconds of RF energy  was applied with a subsequent development of cavotricuspid isthmus  conduction block.   FLUOROSCOPY TIME:  A total of 10 minutes and 30 seconds of fluoroscopy  time was utilized at seven and a half transfer second for ablation of  both tachycardias.   IMPRESSION:  1. Normal sinus function.  2. Abnormal atrial function manifested by previously identified      typical atrial flutter.  Cavotricuspid isthmus conduction block was      accomplished using RF energy applied there.  3. Abnormal atrioventricular nodal function manifested by slow-slow      atrioventricular nodal re-entry tachycardia.  The catheter ablation      in the posterior septal space  eliminating antegrade slow pathway      function and thereby the substrate for the patient's      tachyarrhythmia.  4. Normal His-Purkinje system function.  5. No accessory pathway.  6. Normal ventricular response to programmed stimulation.   SUMMARY AND CONCLUSION:  The results of electrophysiological testing  identified both the clinical arrhythmias:  Slow-slow atrioventricular  nodal re-entry and atrial flutter.  Arrhythmia mapping confirmed the  mechanisms and the catheter ablation successfully eliminated the  substrates for both of these tachyarrhythmias.   The patient will be observed overnight.  He will have his Toprol  discontinued because of modest bradycardia.  Endocarditis prophylaxis  will be utilized, and he is already taking Plavix as an antiplatelet  agent.  The patient experienced no intraprocedural complications.      Duke Salvia, MD, [facility]  Electronically Signed     SCK/MEDQ  D:  11/18/2007  T:  11/19/2007  Job:  [id number]   cc:   Candee Furbish  Electrophysiology Laboratory

## 2010-11-06 NOTE — Assessment & Plan Note (Signed)
[name]                            CARDIOLOGY OFFICE NOTE   [name], [name]                     MRN:          [id number]  DATE:08/22/2008                            DOB:          [birth year]    [name] is a pleasant 76 year old gentleman with a history of  coronary artery disease, status post coronary bypassing graft.  This was  performed in 1998.  His last Myoview was performed on November 27, 2007.  At  that time, his ejection fraction was 54% and there was no scar or  ischemia.  He also has a history of SVT and atrial flutter ablation.  Since I last saw him, he is doing well from a symptomatic standpoint.  He does have dyspnea on exertion, which have been a chronic issue.  However, there is no orthopnea, PND, or pedal edema.  He has not had  chest pain, palpitations, or syncope.  Note, he is still smoking.   MEDICATIONS:  1. Aspirin 325 mg p.o. daily.  2. Omeprazole.  3. Zocor 40 mg p.o. daily.  4. Plavix 75 mg p.o. daily.  5. Niaspan 1 g p.o. nightly.  6. Toprol 12.5 mg p.o. daily.   PHYSICAL EXAMINATION:  VITAL SIGNS:  Today shows a blood pressure of  139/76 and his pulse is 67.  He weighs 195 pounds.  HEENT:  Normal.  NECK:  Supple with no bruits.  CHEST:  Clear.  CARDIOVASCULAR:  Regular rhythm.  ABDOMEN:  No tenderness.  EXTREMITIES:  No edema.   His electrocardiogram shows a sinus rhythm at a rate of 67.  There is no  RV conduction delay.   DIAGNOSES:  1. Coronary artery disease, status post coronary bypassing graft - Mr.      Estrada has had no chest pain and his last Myoview showed no      ischemia.  We will continue with medical therapy.  Note, he has had      a drug-eluting stent to his left main.  This was performed in      October 2008.  I would decrease his aspirin 81 mg p.o. daily and he      will continue on his Plavix, beta-blocker, Niaspan, and statin.  We      discussed the importance of diet and exercise.  2.  Tobacco abuse - we discussed the importance of discontinuing this      for between 3-10 minutes.  3. History of supraventricular tachycardia/atrial flutter ablation -      he remains in sinus rhythm today.  4. Hyperlipidemia - he will continue on his Zocor and Niaspan.  We      will check lipids and liver to adjust as indicated.  5. Hypertension - his blood pressure is adequately controlled on his      present medications.  6. History of gastroesophageal disease.  7. Remote history of deep vein thrombosis.   He will see me back in 1 year.     [name] Christopher Som, MD, [facility]  Electronically Signed    BSC/MedQ  DD: 08/22/2008  DT: 08/22/2008  Job #: [id number]

## 2010-11-06 NOTE — Assessment & Plan Note (Signed)
[facility] HEALTHCARE                            CARDIOLOGY OFFICE NOTE   Estrada, [name]                     MRN:          [id number]  DATE:11/11/2007                            DOB:          April 27, 1935    [name] is a pleasant gentleman who has a history of coronary artery  disease, status post coronary bypassing graft.  He underwent cardiac  catheterization in October 2008, secondary to chest pain.  At that time,  he was found to have a 99% left main that bifurcated into the LAD and  circumflex.  The LAD was occluded.  The circumflex compromised an OM  branch.  The intermediate was occluded.  The right coronary artery also  had a 99% lesion.  The saphenous vein graft to the right coronary was  patent.  The LIMA to the LAD was also widely patent.  The ejection  fraction was 65%.  The patient had a drug-eluting stent to his left main  at that time.  Since then, he has done reasonably well.  However, Sunday  he states that he lifted a television set.  He subsequently developed  pain in the right chest area.  The pain persisted for approximately 4-5  hours after lifting the television.  Note, the pain was not pleuritic or  positional.  It was not related to food.  There is a question of whether  it increased with exertion.  He also had minor pain Monday, but  improved.  He has not had chest pain since then.  He does have some  dyspnea on exertion, but there is no orthopnea or PND and there is no  pedal edema.  He occasionally has palpitations as well by his report.   MEDICATIONS:  1. Aspirin 325 mg p.o. daily.  2. Omeprazole.  3. Zocor 40 mg p.o. daily.  4. Plavix 75 mg p.o. daily  5. Niaspan 1 gm p.o. daily.  6. Toprol 12.5 mg p.o. daily.   PHYSICAL EXAMINATION:  VITAL SIGNS:  Shows a blood pressure of 110/65  and his pulse is 111.  He weighs 182 pounds.  HEENT:  Normal.  NECK:  Supple.  CHEST:  Clear.  CARDIOVASCULAR:  Reveals a tachycardic  rate, but a regular rhythm.  ABDOMEN:  Shows no tenderness.  EXTREMITIES:  Show no edema.   His electrocardiogram shows a sinus rhythm at a rate of 86.  There are  minor nonspecific ST changes.  There is left atrial enlargement.   DIAGNOSES:  1. Chest pain - [name] has had some pain.  However, this occurred      in the right chest area after lifting at television set.  He states      it is unlike his previous cardiac pain.  I wonder if it may be      musculoskeletal related to lifting the television.  He did state      that it was approximately 5 hours on Sunday.  He has not had any      chest pain today or yesterday.  His electrocardiogram shows no  significant abnormalities.  I will schedule him to have a CK-MB,      troponin-I today.  If it is normal, then I think that it is      essentially ruled out as his symptoms were 72 hours ago.  If so, we      will schedule him to have a Myoview for risk stratification.  2. Coronary artery disease - he will continue on his aspirin and      Plavix, as well as his Toprol and statin.  3. Tobacco abuse - we again discussed importance of discontinuing      this.  4. Hyperlipidemia - he will continue on his Zocor and Niaspan.  I will      increase his Niaspan to 2 gm p.o. daily as his HDL is 31 on his      recent laboratories.  We will plan to repeat his lipids and liver      in 6 weeks.  5. Hypertension - his blood pressure is adequately controlled on his      present medications.  6. History of gastroesophageal reflux disease.  7. Remote history of deep venous thrombosis, treat with Coumadin.   I will see him back in 6 months.     Madolyn Estrada Jens Som, MD, [facility]  Electronically Signed    BSC/MedQ  DD: 11/11/2007  DT: 11/11/2007  Job #: [id number]

## 2010-11-06 NOTE — H&P (Signed)
NAME:  SYLVESTER, MINTON NO.:  [id number]   MEDICAL RECORD NO.:  [id number]          PATIENT TYPE:  INP   LOCATION:  3730                         FACILITY:  [facility]   PHYSICIAN:  Madolyn Frieze. Jens Som, MD, FACCDATE OF BIRTH:  14-Nov-1934   DATE OF ADMISSION:  04/20/2007  DATE OF DISCHARGE:                              HISTORY & PHYSICAL   PRIMARY CARE PHYSICIAN:  Dr. Milus Glazier of [facility].   PRIMARY CARDIOLOGIST:  [facility], has not seen anyone in  greater than 5 years.   CHIEF COMPLAINT:  Chest pain.   HISTORY OF PRESENT ILLNESS:  Mr. Keesling is a 76 year old male with a  history of coronary artery disease.  Today, shortly before 11:30 he had  onset of left abdominal pain that radiated up into his chest.  He went  to his primary MD's office where he received aspirin and sublingual  nitroglycerin x2.  He was sent to the emergency room.  Currently he is  complaining of chest tightness at a 1/10.   Mr. Ausborn is unable to qualify the pain.  He states it reached a 10/10  at its worst and is currently a 1/10.  Initially he states that it felt  like indigestion and then he felt like he needed to cough something up.  He denies nausea, vomiting or diaphoresis but states he was a little  short of breath.  His pain did not change with movement or deep  inspiration.  The symptoms lasted approximately 2 hours and then  decreased to their current level.  Although he still has some chest  tightness he is otherwise resting comfortably now.   PAST MEDICAL HISTORY:  1. Status post aortocoronary bypass surgery approximately 1998.  2. Family history of coronary artery disease.  3. Ongoing tobacco use.  4. Remote history of a clot in his leg for which he was briefly on      Coumadin but this was discontinued a long time ago.  5. Gastroesophageal reflux disease.   SURGICAL HISTORY:  Status post cardiac catheterization and bypass  surgery as well as EGD with  dilatation x3, left lower extremity vein  stripping and left knee arthroscopy.   ALLERGIES:  CELEBREX.   MEDICATIONS:  Currently he takes an aspirin daily and a reflux pill  which we believe is Nexium.   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  He has a greater than 50 pack-year history of tobacco use  and denies alcohol or drug abuse.   FAMILY HISTORY:  His mother died in her 54s of an aneurysm and his  father died at age 60 with coronary artery disease and he had one  brother that died in his 67s of coronary artery disease.   REVIEW OF SYSTEMS:  He has hearing loss.  He has arthralgias in his  knees.  He has dysphagia.  He has reflux symptoms occasionally.  The  chest pain is described above.  He has chronic dyspnea on  exertion which  has not changed recently.  He denies fevers, chills, coughing, wheezing,  hematemesis, hemoptysis, melena.  Full 14 point review of systems is  otherwise negative.   PHYSICAL EXAM:  VITAL SIGNS:  Temperature is 97.3, blood pressure  114/56, pulse 68, respiratory rate 18, O2 saturation 97% on room air.  GENERAL:  He is a well-developed, well-nourished white male in no acute  distress.  HEENT:  Normal.  NECK:  There is no lymphadenopathy, thyromegaly, bruit or JVD noted.  CVA:  Heart is regular rate and rhythm with an S1-S2 and no significant  murmur , rub or gallop noted.  Distal pulses are intact but slightly  decreased in the lower extremities and no femoral bruits are  appreciated.  LUNGS:  He has decreased breath sounds in the bases but no wheezing or  rhonchi are noted.  SKIN:  No rashes or lesions are noted.  He has tobacco stains on his  lower lip and on his hands.  ABDOMEN:  Soft and nontender with active bowel sounds.  EXTREMITIES:  There is no cyanosis, clubbing or edema noted.  MUSCULOSKELETAL:  There is no joint deformity  or effusion and no spine  or CVA tenderness is noted.  NEUROLOGIC:  He is alert and oriented.  Cranial nerves II-XII grossly  intact.   Chest x-ray and laboratory values are pending.   EKG performed with chest pain at a 1/10 is sinus rhythm with no acute  ischemic changes.  Per Dr. Loma Boston report the EKG initially  performed had diffuse inferolateral ST-segment depression.   IMPRESSION:  1. Chest pain.  Mr. Lefever is a 76 year old male with past medical      history of bypass surgery, esophageal stricture and reflux with      chest pain.  He developed chest pain this a.m. that this started in      his abdomen and radiated to his chest.  It was not pleuritic and      not related to food.  He went to see Dr. Milus Glazier and his      symptoms improved with sublingual nitroglycerin and aspirin.      Presently, he complains of mild chest tightness but his EKG has no      ST changes.  His initial troponin is slightly elevated at 0.15.  He      will be admitted to the hospital and we will cycle cardiac enzymes.      He has been treated with aspirin and we will add Plavix, IV      heparin, IV nitroglycerin, Lopressor and a statin.  Cardiac      catheterization was discussed with the patient, his wife and son      including the risks and benefits.  The patient and his family are      aware of the risks and agreed to proceed.  He is strongly      encouraged to discontinue tobacco.      Theodore Demark, PA-C      Madolyn Frieze. Jens Som, MD, [facility]  Electronically Signed    RB/MEDQ  D:  04/20/2007  T:  04/21/2007  Job:  [id number]   cc:   Elvina Sidle, M.D.

## 2010-11-06 NOTE — Assessment & Plan Note (Signed)
[facility] HEALTHCARE                            CARDIOLOGY OFFICE NOTE   Oaklen, Thiam LAVI SHEEHAN                     MRN:          [id number]  DATE:12/03/2007                            DOB:          Jan 13, 1935    HISTORY:  Mr. Sackmann is a gentleman who has a history of coronary artery  disease status post coronary bypass and graft.  His last catheterization  in October 2008 showed a patent LIMA to the LAD and a patent saphenous  vein graft to the right coronary artery.  However, he did have  significant left main disease that bifurcated into the circumflex.  He  had a drug-eluting stent to his left main at that time.  I recently saw  him on Nov 11, 2007 secondary to chest pain.  We checked markers and  they were negative and we scheduled him for Myoview.  However, when he  came for his Myoview, he was found to be in atrial flutter.  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  On Nov 18, 2007, he  underwent ablation of the AVNRT and flutter by Dr. Graciela Husbands.  He had a  followup Myoview after the procedure on November 27, 2007.  The ejection  fraction was 54% and there was no sign of scar or ischemia.  Since then,  he has had some fatigue and mild dyspnea on exertion.  There is no  orthopnea, PND, pedal edema, palpitations, presyncope, syncope or chest  pain.  He is continuing to smoke.   PRESENT MEDICATIONS:  1. Aspirin 325 mg p.o. daily.  2. Plavix 75 mg p.o. daily.  3. Zocor 40 mg p.o. daily.  4. Omeprazole.  5. Niaspan 2 g p.o. daily  6. Toprol 12.5 mg p.o. daily.   PHYSICAL EXAMINATION:  VITAL SIGNS:  Blood pressure of 123/69.  His  pulse is 65.  His weight is 182 pounds.  HEENT:  Normal.  NECK:  Supple.  CHEST:  Clear.  CARDIOVASCULAR:  Regular rhythm.  ABDOMEN:  No tenderness.  EXTREMITIES:  No edema.   DIAGNOSES:  1. Supraventricular tachycardia/atrial flutter - both of these have      been ablated, and he is in sinus rhythm on  examination today.  2. Coronary artery disease status post coronary bypass graft as well      as drug-eluting stent to the left main - he will continue on his      aspirin, Plavix as well as his Toprol and statin.  3. Tobacco abuse - we again discussed the importance of discontinuing      this.  4. Hyperlipidemia - he will continue on his Zocor and we have recently      increased his Niaspan.  We will check lipids and liver later this      month and adjust as indicated.  I will also check a TSH at that      time secondary to fatigue.  5. Hypertension - his blood pressure is adequately controlled.  6. History of gastroesophageal reflux disease.  7. Remote history of deep vein thrombosis.  He will see me back in 9 months.     Madolyn Frieze Jens Som, MD, [facility]  Electronically Signed    BSC/MedQ  DD: 12/03/2007  DT: 12/04/2007  Job #: [id number]

## 2010-11-09 NOTE — Op Note (Signed)
NAME:  [name], [name] NO.:  [id number]   MEDICAL RECORD NO.:  [id number]          PATIENT TYPE:  AMB   LOCATION:  ENDO                         FACILITY:  [facility]   PHYSICIAN:  John C. Madilyn Fireman, M.D.    DATE OF BIRTH:  19-Jun-1935   DATE OF PROCEDURE:  03/21/2004  DATE OF DISCHARGE:                                 OPERATIVE REPORT   INDICATIONS FOR PROCEDURE:  Typical solid food dysphagia suggestive of a  lower esophageal ring or stricture.   PROCEDURE:  The patient was placed in the left lateral decubitus position  and placed on the pulse monitor with continuous low flow oxygen delivered by  nasal cannula. He was sedated with 50 mcg IV fentanyl and 4 mg IV Versed.  Olympus video endoscope was advanced under direct vision to the oropharynx  and esophagus. The esophagus was straight and with normal caliber with  squamocolumnar line at 38 cm. There appeared to be a smooth short distal  stricture with minimal resistance to passive of the scope. Beyond it, there  was no visual inflammation. No distinct ring or hiatal hernia. The stomach  was entered, and a small amount of liquid secretions were suctioned from the  fundus. Retroflexed view of the cardia was unremarkable. The fundus, body,  antrum, and pylorus all appeared normal. The duodenum was entered, and both  the bulb and second portion were well inspected and appeared to be within  normal limits. The [facility] guide wire was placed through the endoscope  channel, and the scope withdrawn. Savory dilators of 16 and 17 mm were  passed over the guide wire with mild resistance and a small amount of blood  seen on the second dilator. The 17-mm dilator was removed together with the  wire, and the patient returned to the recovery room in stable condition. He  tolerated the procedure well, and there were no immediate complications.   IMPRESSION:  Short distal esophageal stricture status post dilatation to 17  mm.   PLAN:   Advance diet and observe response to dilatation.      JCH/MEDQ  D:  03/21/2004  T:  03/21/2004  Job:  [id number]   cc:   Clydie Braun L. Hal Hope, M.D.  [address] [place]  [place] [address]  Fax: [phone]

## 2010-11-09 NOTE — Op Note (Signed)
[place]  Patient:    [name], [name] Visit Number: [id number] MRN: [id number]          Service Type: DSU Location: [facility] Attending Physician:  Marcene Corning Dictated by:   Lubertha Basque. Jerl Santos, M.D. Proc. Date: 11/10/01 Admit Date:  11/10/2001                             Operative Report  CONTINUATION  A dry gauze bandage was applied with a loose Ace wrap.  Estimated blood loss and intraoperative fluids can be obtained from Anesthesia records.  DISPOSITION:  The patient was taken to the recovery room in stable conditions. Plans were for him to go home the same day and to follow up in the office in less than a week.  I will contact her by phone tonight. Dictated by:   Lubertha Basque Jerl Santos, M.D. Attending Physician:  Marcene Corning DD:  11/10/01 TD:  11/11/01 Job: [id number]

## 2010-11-09 NOTE — Op Note (Signed)
[place]  Patient:    DEADRIAN, TOYA Visit Number: [id number] MRN: [id number]          Service Type: DSU Location: [facility] Attending Physician:  Marcene Corning Dictated by:   Lubertha Basque. Jerl Santos, M.D. Proc. Date: 11/10/01 Admit Date:  11/10/2001                             Operative Report  INCOMPLETE  PREOPERATIVE DIAGNOSIS:  Left knee torn medial meniscus.  POSTOPERATIVE DIAGNOSIS: 1. Left knee torn medial and torn lateral meniscus. 2. Left knee chondromalacia.  OPERATION PERFORMED: 1. Left knee partial medial and partial lateral meniscectomy. 2. Left knee chondroplasty, medial and patellofemoral compartments.  ANESTHESIA:  Knee block and MAC.  ATTENDING SURGEON:  Lubertha Basque. Jerl Santos, M.D.  ASSISTANT:  Lindwood Qua, P.A.  INDICATIONS FOR PROCEDURE:  The patient is a 76 year old man with a couple of months of left knee pain and effusion.  This has been drained on at least one occasion.  He has persisted wtih pain on the inside aspect which makes it difficult for him to rest and to walk.  He is offered an arthroscopy.  The procedure was discussed with the patient and informed operative consent was obtained after discussion of possible complications of reaction to anesthesia and infection.  DESCRIPTION OF PROCEDURE:  The patient was taken to an operating suite where knee block anesthesia was applied along with MAC.  He was then positioned supine and prepped and draped in normal sterile fashion.  After administration of preop intravenous antibiotics, an arthroscopy of the left knee was performed through two inferior portals.  The suprapatellar pouch was benign while the patellofemoral joint exhibited some grade 2 chondromalacia addressed with a brief chondroplasty.  The medial compartment had some grade 3 breakdown with some flaps of cartilage which required a thorough chondroplastly.  He also had a degenerative tear of the  posterior horn of the medial meniscus addressed with a partial medial meniscectomy.  This was especially significant near the attachment point at the far posterior corner where most of the 10% partial medial meniscectomy was performed.  The lateral meniscus had some degenerative fraying addressed with a tiny partial lateral meniscectomy.  The ACL was intact.  The knee was thoroughly irrigated at the end of the case followed by placement of Marcaine with epinephrine and morphine.  Adaptic was placed over the portals.  We also injected some Depo-Medrol.  Dry gauze was applied followed by Ace wrap.  Estimated blood loss and intraoperative fluids y Dictated by:   Lubertha Basque. Jerl Santos, M.D. Attending Physician:  Marcene Corning DD:  11/10/01 TD:  11/11/01 Job: [id number]

## 2010-11-18 ENCOUNTER — Other Ambulatory Visit: Payer: Self-pay | Admitting: Cardiology

## 2010-11-28 ENCOUNTER — Encounter: Payer: Self-pay | Admitting: Cardiology

## 2010-12-17 ENCOUNTER — Other Ambulatory Visit: Payer: Self-pay | Admitting: *Deleted

## 2010-12-17 MED ORDER — CLOPIDOGREL BISULFATE 75 MG PO TABS: 75.0000 mg | ORAL_TABLET | Freq: Every day | ORAL | Status: DC

## 2010-12-28 ENCOUNTER — Ambulatory Visit: Payer: Self-pay | Admitting: Cardiology

## 2010-12-31 ENCOUNTER — Encounter: Payer: Self-pay | Admitting: Cardiology

## 2011-01-08 ENCOUNTER — Encounter: Payer: Self-pay | Admitting: Cardiology

## 2011-01-08 ENCOUNTER — Ambulatory Visit (INDEPENDENT_AMBULATORY_CARE_PROVIDER_SITE_OTHER): Payer: Medicare Other | Admitting: Cardiology

## 2011-01-08 VITALS — BP 137/76 | HR 67 | Ht 74.0 in | Wt 186.0 lb

## 2011-01-08 DIAGNOSIS — R0989 Other specified symptoms and signs involving the circulatory and respiratory systems: Secondary | ICD-10-CM | POA: Insufficient documentation

## 2011-01-08 DIAGNOSIS — R5383 Other fatigue: Secondary | ICD-10-CM | POA: Insufficient documentation

## 2011-01-08 DIAGNOSIS — I2510 Atherosclerotic heart disease of native coronary artery without angina pectoris: Secondary | ICD-10-CM

## 2011-01-08 DIAGNOSIS — F17200 Nicotine dependence, unspecified, uncomplicated: Secondary | ICD-10-CM

## 2011-01-08 DIAGNOSIS — E785 Hyperlipidemia, unspecified: Secondary | ICD-10-CM

## 2011-01-08 DIAGNOSIS — R5381 Other malaise: Secondary | ICD-10-CM

## 2011-01-08 DIAGNOSIS — I4892 Unspecified atrial flutter: Secondary | ICD-10-CM

## 2011-01-08 DIAGNOSIS — I10 Essential (primary) hypertension: Secondary | ICD-10-CM

## 2011-01-08 NOTE — Assessment & Plan Note (Signed)
Check CBC and TSH 

## 2011-01-08 NOTE — Assessment & Plan Note (Signed)
Continue aspirin but discontinued Plavix. Continue statin. Scheduled Myoview. He does describe dyspnea but this may be secondary to COPD.

## 2011-01-08 NOTE — Assessment & Plan Note (Signed)
Blood pressure control. Continue present medications. Check potassium and renal function. 

## 2011-01-08 NOTE — Assessment & Plan Note (Signed)
Status post ablation. 

## 2011-01-08 NOTE — Assessment & Plan Note (Signed)
Patient counseled on discontinuing. 

## 2011-01-08 NOTE — Assessment & Plan Note (Signed)
Schedule carotid Dopplers. 

## 2011-01-08 NOTE — Patient Instructions (Signed)
Your physician recommends that you schedule a follow-up appointment in: 12 months with Dr. Jens Som. Your physician has recommended you make the following change in your medication: Discontinue Plavix. Start Ec Asprin 81 mg once a day. Your physician has requested that you have an adenosine myoview. For further information please visit [email or web address] Please follow instruction sheet, as given. Your physician recommends that you return for lab work in: Fasting lipid, LFT, BMET, TSH, CBC the same day as Myoview. Your physician has requested that you have a carotid duplex. This test is an ultrasound of the carotid arteries in your neck. It looks at blood flow through these arteries that supply the brain with blood. Allow one hour for this exam. There are no restrictions or special instructions.

## 2011-01-08 NOTE — Assessment & Plan Note (Signed)
Continue present medications. Check potassium and renal function. 

## 2011-01-08 NOTE — Progress Notes (Signed)
HPI:[name] is a pleasant  gentleman with a history of coronary artery disease, status post coronary bypassing graft.  This was performed in 1998.  His last catheterization in October 2008 showed a patent LIMA to the LAD and a patent saphenous vein graft to the right coronary artery.  However, he did have significant left main disease that bifurcated into the circumflex.  He had a drug-eluting stent to his left main at that time. His last Myoview was performed on November 27, 2007.  At that time, his ejection fraction was 54% and there was no scar or ischemia.  He also has a history of SVT and atrial flutter ablation. Abdominal ultrasound in August of 2011 showed no aneurysm. Since I last saw him July 2011, the patient has dyspnea with moderate activities but not with routine activities. It is relieved with rest. It is not associated with chest pain. There is no orthopnea, PND or pedal edema. There is no syncope or palpitations. There is no exertional chest pain. Patient complains of fatigue.   Current Outpatient Prescriptions  Medication Sig Dispense Refill  . aspirin 325 MG tablet Take 325 mg by mouth daily.        . clopidogrel (PLAVIX) 75 MG tablet Take 1 tablet (75 mg total) by mouth daily.  30 tablet  1  . fluticasone (FLONASE) 50 MCG/ACT nasal spray       . metoprolol succinate (TOPROL-XL) 25 MG 24 hr tablet TAKE 1/2 TABLET BY MOUTH ONCE DAILY  15 tablet  10  . niacin (NIASPAN) 1000 MG CR tablet Take 1,000 mg by mouth daily.        Marland Kitchen omeprazole (PRILOSEC) 20 MG capsule Take 20 mg by mouth daily.        . simvastatin (ZOCOR) 40 MG tablet Take 40 mg by mouth at bedtime.           Past Medical History  Diagnosis Date  . Atrial flutter     and SVT that is post ablation  . Acute venous embolism and thrombosis of unspecified deep vessels of lower extremity   . Dyslipidemia   . CAD (coronary artery disease)   . HTN (hypertension)     Past Surgical History  Procedure Date  . Cardiac  catheterization     s/p cardiac catheterization and bypass surgery as well as EGD with dilation x3, left lower extremity vein stripping  . Left knee anthroscopy     History   Social History  . Marital Status: Married    Spouse Name: N/A    Number of Children: N/A  . Years of Education: N/A   Occupational History  . Not on file.   Social History Main Topics  . Smoking status: Current Everyday Smoker  . Smokeless tobacco: Not on file   Comment: greater than 50 pack year history  . Alcohol Use: No  . Drug Use: No  . Sexually Active: Not on file   Other Topics Concern  . 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     ROS: Fatigue but no fevers or chills, productive cough, hemoptysis, dysphasia, odynophagia, melena, hematochezia, dysuria, hematuria, rash, seizure activity, orthopnea, PND, pedal edema, claudication. Remaining systems are negative.  Physical Exam: Well-developed well-nourished in no acute distress.  Skin is warm and dry.  HEENT is normal.  Neck is  supple. No thyromegaly. Bilateral bruits Chest is clear to auscultation with normal expansion.  Cardiovascular exam is regular rate and rhythm.  Abdominal exam nontender or distended. No masses palpated. Extremities show no edema. neuro grossly intact  ECG NSR with no ST changes.

## 2011-01-23 ENCOUNTER — Encounter: Payer: Self-pay | Admitting: *Deleted

## 2011-01-24 ENCOUNTER — Other Ambulatory Visit (INDEPENDENT_AMBULATORY_CARE_PROVIDER_SITE_OTHER): Payer: Medicare Other | Admitting: *Deleted

## 2011-01-24 ENCOUNTER — Encounter (INDEPENDENT_AMBULATORY_CARE_PROVIDER_SITE_OTHER): Payer: Medicare Other | Admitting: *Deleted

## 2011-01-24 ENCOUNTER — Ambulatory Visit (HOSPITAL_COMMUNITY): Payer: Medicare Other | Attending: Cardiology | Admitting: Radiology

## 2011-01-24 VITALS — Ht 74.0 in | Wt 184.0 lb

## 2011-01-24 DIAGNOSIS — I252 Old myocardial infarction: Secondary | ICD-10-CM | POA: Insufficient documentation

## 2011-01-24 DIAGNOSIS — E785 Hyperlipidemia, unspecified: Secondary | ICD-10-CM

## 2011-01-24 DIAGNOSIS — F17200 Nicotine dependence, unspecified, uncomplicated: Secondary | ICD-10-CM | POA: Insufficient documentation

## 2011-01-24 DIAGNOSIS — R5381 Other malaise: Secondary | ICD-10-CM | POA: Insufficient documentation

## 2011-01-24 DIAGNOSIS — I4892 Unspecified atrial flutter: Secondary | ICD-10-CM

## 2011-01-24 DIAGNOSIS — I2510 Atherosclerotic heart disease of native coronary artery without angina pectoris: Secondary | ICD-10-CM

## 2011-01-24 DIAGNOSIS — R0609 Other forms of dyspnea: Secondary | ICD-10-CM | POA: Insufficient documentation

## 2011-01-24 DIAGNOSIS — I10 Essential (primary) hypertension: Secondary | ICD-10-CM | POA: Insufficient documentation

## 2011-01-24 DIAGNOSIS — J449 Chronic obstructive pulmonary disease, unspecified: Secondary | ICD-10-CM | POA: Insufficient documentation

## 2011-01-24 DIAGNOSIS — I25810 Atherosclerosis of coronary artery bypass graft(s) without angina pectoris: Secondary | ICD-10-CM

## 2011-01-24 DIAGNOSIS — Z8249 Family history of ischemic heart disease and other diseases of the circulatory system: Secondary | ICD-10-CM | POA: Insufficient documentation

## 2011-01-24 DIAGNOSIS — R0989 Other specified symptoms and signs involving the circulatory and respiratory systems: Secondary | ICD-10-CM

## 2011-01-24 DIAGNOSIS — J4489 Other specified chronic obstructive pulmonary disease: Secondary | ICD-10-CM | POA: Insufficient documentation

## 2011-01-24 LAB — BASIC METABOLIC PANEL
CO2: 27 mEq/L (ref 19–32)
Calcium: 9.3 mg/dL (ref 8.4–10.5)
Creatinine, Ser: 1 mg/dL (ref 0.4–1.5)
Sodium: 146 mEq/L — ABNORMAL HIGH (ref 135–145)

## 2011-01-24 LAB — CBC WITH DIFFERENTIAL/PLATELET
Basophils Absolute: 0 10*3/uL (ref 0.0–0.1)
Eosinophils Absolute: 0.3 10*3/uL (ref 0.0–0.7)
HCT: 40.3 % (ref 39.0–52.0)
Hemoglobin: 13 g/dL (ref 13.0–17.0)
Lymphs Abs: 2.1 10*3/uL (ref 0.7–4.0)
MCHC: 32.2 g/dL (ref 30.0–36.0)
Monocytes Absolute: 0.9 10*3/uL (ref 0.1–1.0)
Neutro Abs: 3.8 10*3/uL (ref 1.4–7.7)
Neutrophils Relative %: 53.5 % (ref 43.0–77.0)
RBC: 4.38 Mil/uL (ref 4.22–5.81)
RDW: 16.5 % — ABNORMAL HIGH (ref 11.5–14.6)
WBC: 7 10*3/uL (ref 4.5–10.5)

## 2011-01-24 LAB — LIPID PANEL
Cholesterol: 131 mg/dL (ref 0–200)
HDL: 30.4 mg/dL — ABNORMAL LOW (ref >39.00)
Total CHOL/HDL Ratio: 4
Triglycerides: 85 mg/dL (ref 0.0–149.0)
VLDL: 17 mg/dL (ref 0.0–40.0)

## 2011-01-24 MED ORDER — TECHNETIUM TC 99M TETROFOSMIN IV KIT
11.0000 | PACK | Freq: Once | INTRAVENOUS | Status: AC | PRN
  Administered 2011-01-24: 11 via INTRAVENOUS

## 2011-01-24 MED ORDER — REGADENOSON 0.4 MG/5ML IV SOLN
0.4000 mg | Freq: Once | INTRAVENOUS | Status: AC
  Administered 2011-01-24: 0.4 mg via INTRAVENOUS

## 2011-01-24 MED ORDER — TECHNETIUM TC 99M TETROFOSMIN IV KIT
33.0000 | PACK | Freq: Once | INTRAVENOUS | Status: AC | PRN
  Administered 2011-01-24: 33 via INTRAVENOUS

## 2011-01-24 NOTE — Progress Notes (Signed)
[facility] SITE 3 NUCLEAR MED [address] [place] [address] [phone]  Cardiology Nuclear Med Study  Christopher GEESEY is a 76 y.o. male [id number] 06-Nov-1934   Nuclear Med Background Indication for Stress Test:  Evaluation for Ischemia, Graft Patency and Stent Patency History: 11/18/07 Ablation: SVT/AFLUTTER, '98 CABGx3, COPD, '08 Heart Catheterization: EF 65% 99% LM severe 3V DZ, '08 Myocardial Infarction: NSTEMI, 06/09 Myocardial Perfusion Study: EF 54% low risk  and '08 Stents: LM Cardiac Risk Factors: Family History - CAD, Hypertension, Lipids and Smoker  Symptoms:  DOE, Fatigue and Fatigue with Exertion   Nuclear Pre-Procedure Caffeine/Decaff Intake:  None NPO After: 9:30pm   Lungs:  clear IV 0.9% NS with Angio Cath:  20g  IV Site: R Antecubital  IV Started by:  Irean Hong, RN  Chest Size (in):  42 Cup Size: n/a  Height: 6\' 2"  (1.88 m)  Weight:  184 lb (83.462 kg)  BMI:  Body mass index is 23.62 kg/(m^2). Tech Comments:  Held metoprolol x 24 hrs    Nuclear Med Study 1 or 2 day study: 1 day  Stress Test Type:  Eugenie Birks  Reading MD: Olga Millers, MD  Order Authorizing Provider:  B.Crenshaw  Resting Radionuclide: Technetium 43m Tetrofosmin  Resting Radionuclide Dose: 11.0 mCi   Stress Radionuclide:  Technetium 29m Tetrofosmin  Stress Radionuclide Dose: 33.0 mCi           Stress Protocol Rest HR: 56 Stress HR: 75  Rest BP: 136/72 Stress BP: 144/75  Exercise Time (min): n/a METS: n/a   Predicted Max HR: 146 bpm % Max HR: 51.37 bpm Rate Pressure Product: 46962   Dose of Adenosine (mg):  n/a Dose of Lexiscan: 0.4 mg  Dose of Atropine (mg): n/a Dose of Dobutamine: n/a mcg/kg/min (at max HR)  Stress Test Technologist: Milana Na, EMT-P  Nuclear Technologist:  Doyne Keel, CNMT     Rest Procedure:  Myocardial perfusion imaging was performed at rest 45 minutes following the intravenous administration of Technetium 8m  Tetrofosmin. Rest ECG: Sinus Bradycardia  Stress Procedure:  The patient received IV Lexiscan 0.4 mg over 15-seconds with concurrent low level exercise and then Technetium 90m Tetrofosmin was injected at 30-seconds while the patient continued walking one more minute.  There were no significant changes with Lexiscan.  Quantitative spect images were obtained after a 45-minute delay. Stress ECG: No significant ST segment change suggestive of ischemia.  QPS Raw Data Images:  Acquisition technically good; normal left ventricular size. Stress Images:  There is decreased uptake in the inferior wall. Rest Images:  There is decreased uptake in the inferior wall less prominent compared to the stress images. Subtraction (SDS):  These findings are consistent with inferior thinning and possible very mild inferior ischemia towards the apex. Transient Ischemic Dilatation (Normal <1.22):  0.95 Lung/Heart Ratio (Normal <0.45):  0.29  Quantitative Gated Spect Images QGS EDV:  88 ml QGS ESV:  32 ml QGS cine images:  NL LV Function; NL Wall Motion QGS EF: 63%  Impression Exercise Capacity:  Lexiscan with no exercise. BP Response:  Normal blood pressure response. Clinical Symptoms:  No chest pain. ECG Impression:  No significant ST segment change suggestive of ischemia. Comparison with Prior Nuclear Study: Possible very mild inferior ischemia new compared to previous.  Overall Impression:  Low risk stress nuclear study with inferior thinning and possible very mild inferior ischemia towards the apex.   Olga Millers

## 2011-01-25 ENCOUNTER — Encounter: Payer: Self-pay | Admitting: *Deleted

## 2011-01-25 NOTE — Progress Notes (Signed)
Nuclear report routed to Dr. Crenshaw. Norma Ignasiak H  

## 2011-01-28 ENCOUNTER — Encounter: Payer: Self-pay | Admitting: Cardiology

## 2011-01-28 NOTE — Progress Notes (Signed)
pt aware of results [name]  

## 2011-02-20 ENCOUNTER — Other Ambulatory Visit: Payer: Self-pay | Admitting: *Deleted

## 2011-02-20 MED ORDER — SIMVASTATIN 40 MG PO TABS: 40.0000 mg | ORAL_TABLET | Freq: Every day | ORAL | Status: DC

## 2011-04-03 LAB — BASIC METABOLIC PANEL
GFR calc Af Amer: >60
GFR calc non Af Amer: >60
Glucose, Bld: 95
Potassium: 3.9
Sodium: 138

## 2011-04-03 LAB — CBC
HCT: 35.5 — ABNORMAL LOW
HCT: 37.3 — ABNORMAL LOW
Hemoglobin: 12.5 — ABNORMAL LOW
MCHC: 33.4
MCV: 87.7
Platelets: 197
Platelets: 224
Platelets: 232
RBC: 4.26
RDW: 15.7 — ABNORMAL HIGH
RDW: 15.9 — ABNORMAL HIGH
WBC: 8.8
WBC: 9.6

## 2011-04-03 LAB — TROPONIN I: Troponin I: 1.25

## 2011-04-03 LAB — CK TOTAL AND CKMB (NOT AT ARMC)
CK, MB: 3.8
Relative Index: 5.7 — ABNORMAL HIGH
Total CK: 102

## 2011-04-03 LAB — LIPID PANEL
Cholesterol: 162
HDL: 22 — ABNORMAL LOW
Triglycerides: 117

## 2011-04-03 LAB — COMPREHENSIVE METABOLIC PANEL
ALT: 13
AST: 20
CO2: 25
Chloride: 106
GFR calc Af Amer: >60
GFR calc non Af Amer: >60
Potassium: 4
Sodium: 138
Total Bilirubin: 0.5

## 2011-04-03 LAB — CARDIAC PANEL(CRET KIN+CKTOT+MB+TROPI)
CK, MB: 1.4
Relative Index: INVALID
Total CK: 57
Troponin I: 0.66

## 2011-04-03 LAB — DIFFERENTIAL
Basophils Absolute: 0.1
Eosinophils Absolute: 0.2
Eosinophils Relative: 2

## 2011-04-03 LAB — POCT CARDIAC MARKERS
CKMB, poc: 3
Myoglobin, poc: 181

## 2011-04-03 LAB — MAGNESIUM: Magnesium: 2.1

## 2011-04-03 LAB — TSH: TSH: 1.637

## 2011-07-29 ENCOUNTER — Other Ambulatory Visit: Payer: Self-pay | Admitting: *Deleted

## 2011-07-29 DIAGNOSIS — I6529 Occlusion and stenosis of unspecified carotid artery: Secondary | ICD-10-CM

## 2011-07-30 ENCOUNTER — Encounter (INDEPENDENT_AMBULATORY_CARE_PROVIDER_SITE_OTHER): Payer: Medicare Other | Admitting: *Deleted

## 2011-07-30 DIAGNOSIS — I6529 Occlusion and stenosis of unspecified carotid artery: Secondary | ICD-10-CM

## 2011-10-03 ENCOUNTER — Ambulatory Visit
Admission: RE | Admit: 2011-10-03 | Discharge: 2011-10-03 | Disposition: A | Discharge status: Home / Self Care | Payer: Medicare Other | Source: Ambulatory Visit | Attending: Family Medicine | Admitting: Family Medicine

## 2011-10-03 ENCOUNTER — Other Ambulatory Visit: Payer: Self-pay | Admitting: Family Medicine

## 2011-10-03 DIAGNOSIS — R109 Unspecified abdominal pain: Secondary | ICD-10-CM

## 2011-10-03 DIAGNOSIS — R1032 Left lower quadrant pain: Secondary | ICD-10-CM

## 2011-10-03 MED ORDER — IOHEXOL 300 MG/ML  SOLN
30.0000 mL | Freq: Once | INTRAMUSCULAR | Status: AC | PRN
  Administered 2011-10-03: 30 mL via ORAL

## 2011-10-03 MED ORDER — IOHEXOL 300 MG/ML  SOLN: 30.0000 mL | Freq: Once | INTRAMUSCULAR | Status: DC | PRN

## 2011-10-03 MED ORDER — IOHEXOL 300 MG/ML  SOLN: 30.0000 mL | Freq: Once | INTRAMUSCULAR | Status: AC | PRN

## 2011-10-07 ENCOUNTER — Other Ambulatory Visit: Payer: Self-pay | Admitting: Cardiology

## 2011-10-25 ENCOUNTER — Other Ambulatory Visit: Payer: Self-pay | Admitting: Cardiology

## 2011-11-14 ENCOUNTER — Encounter: Payer: Self-pay | Admitting: Cardiology

## 2011-11-14 ENCOUNTER — Ambulatory Visit (INDEPENDENT_AMBULATORY_CARE_PROVIDER_SITE_OTHER): Payer: Medicare Other | Admitting: Cardiology

## 2011-11-14 VITALS — BP 152/84 | HR 71 | Ht 74.0 in | Wt 185.0 lb

## 2011-11-14 DIAGNOSIS — I2510 Atherosclerotic heart disease of native coronary artery without angina pectoris: Secondary | ICD-10-CM

## 2011-11-14 DIAGNOSIS — I679 Cerebrovascular disease, unspecified: Secondary | ICD-10-CM | POA: Insufficient documentation

## 2011-11-14 MED ORDER — METOPROLOL SUCCINATE ER 25 MG PO TB24: 25.0000 mg | ORAL_TABLET | Freq: Every day | ORAL | Status: DC

## 2011-11-14 MED ORDER — NITROGLYCERIN 0.4 MG SL SUBL: 0.4000 mg | SUBLINGUAL_TABLET | SUBLINGUAL | Status: DC | PRN

## 2011-11-14 NOTE — Progress Notes (Signed)
HPI: [name] is a pleasant gentleman with a history of coronary artery disease, status post coronary bypassing graft. This was performed in 1998. His last catheterization in October 2008 showed a patent LIMA to the LAD and a patent saphenous vein graft to the right coronary artery. However, he did have significant left main disease that bifurcated into the circumflex. He had a drug-eluting stent to his left main at that time. His last Myoview was performed in August of 2012. At that time, his ejection fraction was 63% and there was inferior thinning and possible mild inferior ischemia; we are treating medically. He also has a history of SVT and atrial flutter ablation. Abdominal ultrasound in August of 2011 showed no aneurysm. Carotid Dopplers in February 2013 showed a 60-79% right and 40-59% left stenosis. Followup recommended in one year. Since I last saw him July 2012, the patient has dyspnea with more extreme activities but not with routine activities. It is relieved with rest. It is not associated with chest pain. There is no orthopnea, PND or pedal edema. There is no syncope or palpitations. There is no exertional chest pain.    Current Outpatient Prescriptions  Medication Sig Dispense Refill  . aspirin 325 MG tablet Take 325 mg by mouth daily.        . fluticasone (FLONASE) 50 MCG/ACT nasal spray       . metoprolol succinate (TOPROL-XL) 25 MG 24 hr tablet TAKE 1/2 TABLET BY MOUTH ONCE DAILY  15 tablet  6  . niacin (NIASPAN) 1000 MG CR tablet Take 1,000 mg by mouth daily.        Marland Kitchen omeprazole (PRILOSEC) 20 MG capsule Take 20 mg by mouth daily.        . simvastatin (ZOCOR) 40 MG tablet TAKE 1 TABLET (40 MG TOTAL) BY MOUTH AT BEDTIME.  30 tablet  6     Past Medical History  Diagnosis Date  . Atrial flutter     and SVT that is post ablation  . Acute venous embolism and thrombosis of unspecified deep vessels of lower extremity   . Dyslipidemia   . CAD (coronary artery disease)   . HTN  (hypertension)     Past Surgical History  Procedure Date  . Cardiac catheterization     s/p cardiac catheterization and bypass surgery as well as EGD with dilation x3, left lower extremity vein stripping  . Left knee anthroscopy     History   Social History  . Marital Status: Married    Spouse Name: N/A    Number of Children: N/A  . Years of Education: N/A   Occupational History  . Not on file.   Social History Main Topics  . Smoking status: Current Everyday Smoker  . Smokeless tobacco: Not on file   Comment: greater than 50 pack year history  . Alcohol Use: No  . Drug Use: No  . Sexually Active: Not on file   Other Topics Concern  . 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     ROS: no fevers or chills, productive cough, hemoptysis, dysphasia, odynophagia, melena, hematochezia, dysuria, hematuria, rash, seizure activity, orthopnea, PND, pedal edema, claudication. Remaining systems are negative.  Physical Exam: Well-developed well-nourished in no acute distress.  Skin is warm and dry.  HEENT is normal.  Neck is supple.  Chest  is clear to auscultation with normal expansion.  Cardiovascular exam is regular rate and rhythm.  Abdominal exam nontender or distended. No masses palpated. Extremities show no edema. neuro grossly intact  ECG sinus rhythm at a rate of 71. Cannot rule out prior inferior infarct.

## 2011-11-14 NOTE — Assessment & Plan Note (Signed)
Continue aspirin and statin. 

## 2011-11-14 NOTE — Assessment & Plan Note (Signed)
Blood pressure elevated. Increase Toprol to 25 mg daily.

## 2011-11-14 NOTE — Assessment & Plan Note (Signed)
Continue aspirin and statin. Followup carotid Dopplers February 2013. 

## 2011-11-14 NOTE — Assessment & Plan Note (Signed)
Continue statin. Check lipids and liver. 

## 2011-11-14 NOTE — Assessment & Plan Note (Signed)
Patient counseled on discontinuing. 

## 2011-11-14 NOTE — Patient Instructions (Signed)
Your physician wants you to follow-up in: ONE YEAR WITH DR Shelda Pal will receive a reminder letter in the mail two months in advance. If you don't receive a letter, please call our office to schedule the follow-up appointment.   INCREASE METOPROLOL TO ONE TABLET ONCE DAILY  Your physician recommends that you return for a FASTING lipid profile: 11-19-11 AFTER 8:30AM

## 2011-11-19 ENCOUNTER — Other Ambulatory Visit: Payer: Medicare Other

## 2011-11-19 ENCOUNTER — Other Ambulatory Visit (INDEPENDENT_AMBULATORY_CARE_PROVIDER_SITE_OTHER): Payer: Medicare Other

## 2011-11-19 DIAGNOSIS — I2510 Atherosclerotic heart disease of native coronary artery without angina pectoris: Secondary | ICD-10-CM

## 2011-11-19 LAB — HEPATIC FUNCTION PANEL
ALT: 15 U/L (ref 0–53)
AST: 18 U/L (ref 0–37)
Albumin: 3.6 g/dL (ref 3.5–5.2)
Alkaline Phosphatase: 59 U/L (ref 39–117)

## 2011-11-19 LAB — LIPID PANEL
Cholesterol: 124 mg/dL (ref 0–200)
HDL: 37.4 mg/dL — ABNORMAL LOW (ref >39.00)
Triglycerides: 66 mg/dL (ref 0.0–149.0)

## 2011-11-20 ENCOUNTER — Encounter: Payer: Self-pay | Admitting: *Deleted

## 2012-05-27 ENCOUNTER — Other Ambulatory Visit: Payer: Self-pay | Admitting: Cardiology

## 2012-07-16 ENCOUNTER — Other Ambulatory Visit: Payer: Self-pay | Admitting: Family Medicine

## 2012-07-16 ENCOUNTER — Ambulatory Visit
Admission: RE | Admit: 2012-07-16 | Discharge: 2012-07-16 | Disposition: A | Discharge status: Home / Self Care | Payer: Medicare Other | Source: Ambulatory Visit | Attending: Family Medicine | Admitting: Family Medicine

## 2012-07-16 DIAGNOSIS — M549 Dorsalgia, unspecified: Secondary | ICD-10-CM

## 2012-07-16 DIAGNOSIS — R319 Hematuria, unspecified: Secondary | ICD-10-CM

## 2012-07-16 MED ORDER — IOHEXOL 300 MG/ML  SOLN
125.0000 mL | Freq: Once | INTRAMUSCULAR | Status: AC | PRN
  Administered 2012-07-16: 125 mL via INTRAVENOUS

## 2012-10-27 ENCOUNTER — Encounter: Payer: Self-pay | Admitting: Cardiology

## 2012-10-27 ENCOUNTER — Ambulatory Visit (INDEPENDENT_AMBULATORY_CARE_PROVIDER_SITE_OTHER): Payer: Medicare Other | Admitting: Cardiology

## 2012-10-27 VITALS — BP 148/70 | HR 59 | Ht 74.0 in | Wt 189.0 lb

## 2012-10-27 DIAGNOSIS — I10 Essential (primary) hypertension: Secondary | ICD-10-CM

## 2012-10-27 DIAGNOSIS — I2510 Atherosclerotic heart disease of native coronary artery without angina pectoris: Secondary | ICD-10-CM

## 2012-10-27 MED ORDER — LISINOPRIL 5 MG PO TABS: 5.0000 mg | ORAL_TABLET | Freq: Every day | ORAL | Status: DC

## 2012-10-27 NOTE — Patient Instructions (Addendum)
Your physician wants you to follow-up in: ONE YEAR WITH DR Shelda Pal will receive a reminder letter in the mail two months in advance. If you don't receive a letter, please call our office to schedule the follow-up appointment.   Your physician has requested that you have a carotid duplex. This test is an ultrasound of the carotid arteries in your neck. It looks at blood flow through these arteries that supply the brain with blood. Allow one hour for this exam. There are no restrictions or special instructions.   START LISINOPRIL 5 MG ONCE DAILY  Your physician recommends that you return for lab work in: ONE WEEK= DO NOT EAT PRIOR TO LABS

## 2012-10-27 NOTE — Assessment & Plan Note (Signed)
Continue statin. Check lipids and liver. 

## 2012-10-27 NOTE — Assessment & Plan Note (Signed)
Continue aspirin and statin. 

## 2012-10-27 NOTE — Assessment & Plan Note (Signed)
Blood pressure elevated. Add lisinopril 5 mg daily. Check potassium and renal function in one week.

## 2012-10-27 NOTE — Assessment & Plan Note (Signed)
Continue aspirin and statin. Schedule followup carotid Dopplers. 

## 2012-10-27 NOTE — Assessment & Plan Note (Signed)
Patient counseled on discontinuing. 

## 2012-10-27 NOTE — Progress Notes (Signed)
HPI: [name] is a pleasant gentleman with a history of coronary artery disease, status post coronary bypassing graft. This was performed in 1998. His last catheterization in October 2008 showed a patent LIMA to the LAD and a patent saphenous vein graft to the right coronary artery. However, he did have significant left main disease that bifurcated into the circumflex. He had a drug-eluting stent to his left main at that time. His last Myoview was performed in August of 2012. At that time, his ejection fraction was 63% and there was inferior thinning and possible mild inferior ischemia; we are treating medically. He also has a history of SVT and atrial flutter ablation. Abdominal ultrasound in August of 2011 showed no aneurysm. Carotid Dopplers in February 2013 showed a 60-79% right and 40-59% left stenosis. Followup recommended in one year. Since I last saw him Feb 2013, the patient has dyspnea with more extreme activities but not with routine activities. It is relieved with rest. It is not associated with chest pain. There is no orthopnea, PND or pedal edema. There is no syncope or palpitations. There is no exertional chest pain.   Current Outpatient Prescriptions  Medication Sig Dispense Refill  . aspirin 325 MG tablet Take 325 mg by mouth daily.        . fluticasone (FLONASE) 50 MCG/ACT nasal spray       . metoprolol succinate (TOPROL-XL) 25 MG 24 hr tablet Take 1 tablet (25 mg total) by mouth daily.  30 tablet  12  . niacin (NIASPAN) 1000 MG CR tablet Take 1,000 mg by mouth daily.        . nitroGLYCERIN (NITROSTAT) 0.4 MG SL tablet Place 1 tablet (0.4 mg total) under the tongue every 5 (five) minutes as needed for chest pain.  90 tablet  3  . omeprazole (PRILOSEC) 20 MG capsule Take 20 mg by mouth daily.        . simvastatin (ZOCOR) 40 MG tablet TAKE 1 TABLET (40 MG TOTAL) BY MOUTH AT BEDTIME.  30 tablet  6   No current facility-administered medications for this visit.     Past Medical  History  Diagnosis Date  . Atrial flutter     and SVT that is post ablation  . Acute venous embolism and thrombosis of unspecified deep vessels of lower extremity   . Dyslipidemia   . CAD (coronary artery disease)   . HTN (hypertension)     Past Surgical History  Procedure Laterality Date  . Cardiac catheterization      s/p cardiac catheterization and bypass surgery as well as EGD with dilation x3, left lower extremity vein stripping  . Left knee anthroscopy      History   Social History  . Marital Status: Married    Spouse Name: N/A    Number of Children: N/A  . Years of Education: N/A   Occupational History  . Not on file.   Social History Main Topics  . Smoking status: Current Every Day Smoker  . Smokeless tobacco: Not on file     Comment: greater than 50 pack year history  . Alcohol Use: No  . Drug Use: No  . Sexually Active: Not on file   Other Topics Concern  . 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     ROS: no fevers or  chills, productive cough, hemoptysis, dysphasia, odynophagia, melena, hematochezia, dysuria, hematuria, rash, seizure activity, orthopnea, PND, pedal edema, claudication. Remaining systems are negative.  Physical Exam: Well-developed well-nourished in no acute distress.  Skin is warm and dry.  HEENT is normal.  Neck is supple.  Chest is clear to auscultation with normal expansion.  Cardiovascular exam is regular rate and rhythm.  Abdominal exam nontender or distended. No masses palpated. Extremities show no edema. neuro grossly intact  ECG sinus rhythm at a rate of 59. No ST changes.

## 2012-11-03 ENCOUNTER — Other Ambulatory Visit (INDEPENDENT_AMBULATORY_CARE_PROVIDER_SITE_OTHER): Payer: Medicare Other

## 2012-11-03 ENCOUNTER — Encounter (INDEPENDENT_AMBULATORY_CARE_PROVIDER_SITE_OTHER): Payer: Medicare Other

## 2012-11-03 DIAGNOSIS — I2510 Atherosclerotic heart disease of native coronary artery without angina pectoris: Secondary | ICD-10-CM

## 2012-11-03 DIAGNOSIS — I6529 Occlusion and stenosis of unspecified carotid artery: Secondary | ICD-10-CM

## 2012-11-03 DIAGNOSIS — I10 Essential (primary) hypertension: Secondary | ICD-10-CM

## 2012-11-03 LAB — BASIC METABOLIC PANEL
BUN: 18 mg/dL (ref 6–23)
Calcium: 9.3 mg/dL (ref 8.4–10.5)
Creatinine, Ser: 1.3 mg/dL (ref 0.4–1.5)
GFR: 57.05 mL/min — ABNORMAL LOW (ref >60.00)
Glucose, Bld: 102 mg/dL — ABNORMAL HIGH (ref 70–99)
Sodium: 143 mEq/L (ref 135–145)

## 2012-11-03 LAB — HEPATIC FUNCTION PANEL
ALT: 12 U/L (ref 0–53)
AST: 19 U/L (ref 0–37)
Total Bilirubin: 0.6 mg/dL (ref 0.3–1.2)

## 2012-11-03 LAB — LIPID PANEL: HDL: 26.8 mg/dL — ABNORMAL LOW (ref >39.00)

## 2012-11-04 ENCOUNTER — Other Ambulatory Visit: Payer: Self-pay | Admitting: *Deleted

## 2012-11-04 ENCOUNTER — Encounter: Payer: Self-pay | Admitting: *Deleted

## 2012-11-04 DIAGNOSIS — E875 Hyperkalemia: Secondary | ICD-10-CM

## 2012-11-05 ENCOUNTER — Other Ambulatory Visit (INDEPENDENT_AMBULATORY_CARE_PROVIDER_SITE_OTHER): Payer: Medicare Other

## 2012-11-05 DIAGNOSIS — E875 Hyperkalemia: Secondary | ICD-10-CM

## 2012-11-05 LAB — BASIC METABOLIC PANEL
Calcium: 9.1 mg/dL (ref 8.4–10.5)
Chloride: 110 mEq/L (ref 96–112)
Creatinine, Ser: 1.3 mg/dL (ref 0.4–1.5)
GFR: 58.08 mL/min — ABNORMAL LOW (ref >60.00)

## 2012-11-23 ENCOUNTER — Encounter: Payer: Self-pay | Admitting: Family Medicine

## 2012-11-23 ENCOUNTER — Ambulatory Visit (INDEPENDENT_AMBULATORY_CARE_PROVIDER_SITE_OTHER): Payer: Medicare Other | Admitting: Family Medicine

## 2012-11-23 VITALS — BP 140/74 | HR 62 | Temp 97.8°F | Resp 20 | Wt 189.0 lb

## 2012-11-23 DIAGNOSIS — T148 Other injury of unspecified body region: Secondary | ICD-10-CM

## 2012-11-23 DIAGNOSIS — W57XXXA Bitten or stung by nonvenomous insect and other nonvenomous arthropods, initial encounter: Secondary | ICD-10-CM

## 2012-11-23 MED ORDER — DOXYCYCLINE HYCLATE 100 MG PO TABS: 100.0000 mg | ORAL_TABLET | Freq: Two times a day (BID) | ORAL | Status: DC

## 2012-11-23 MED ORDER — TRIAMCINOLONE ACETONIDE 0.1 % EX CREA: TOPICAL_CREAM | Freq: Two times a day (BID) | CUTANEOUS | Status: DC

## 2012-11-23 NOTE — Progress Notes (Signed)
  Subjective:    Patient ID: [name], male    DOB: 04-19-1935, 78 y.o.   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 When he went home quit work for the evening he found 20 individual tics in his groin, on his leg, and on his lower back. Each of them had bitten him.  The bites are itching at the present time. He denies any evidence of erythema migrans or flulike symptoms consistent with [place] spotted fever.  He is not applying any DEET. Past Medical History  Diagnosis Date  . Atrial flutter     and SVT that is post ablation  . Acute venous embolism and thrombosis of unspecified deep vessels of lower extremity   . Dyslipidemia   . CAD (coronary artery disease)   . HTN (hypertension)    Current Outpatient Prescriptions on File Prior to Visit  Medication Sig Dispense Refill  . aspirin 325 MG tablet Take 325 mg by mouth daily.        . fluticasone (FLONASE) 50 MCG/ACT nasal spray       . lisinopril (PRINIVIL,ZESTRIL) 5 MG tablet Take 1 tablet (5 mg total) by mouth daily.  90 tablet  3  . metoprolol succinate (TOPROL-XL) 25 MG 24 hr tablet Take 1 tablet (25 mg total) by mouth daily.  30 tablet  12  . niacin (NIASPAN) 1000 MG CR tablet Take 1,000 mg by mouth daily.        Marland Kitchen omeprazole (PRILOSEC) 20 MG capsule Take 20 mg by mouth daily.        . simvastatin (ZOCOR) 40 MG tablet TAKE 1 TABLET (40 MG TOTAL) BY MOUTH AT BEDTIME.  30 tablet  6   No current facility-administered medications on file prior to visit.   Allergies  Allergen Reactions  . Celebrex (Celecoxib) Rash    blisters      Review of Systems  All other systems reviewed and are negative.       Objective:   Physical Exam  Vitals reviewed. Cardiovascular: Normal rate, regular rhythm and normal heart sounds.   Pulmonary/Chest: Effort normal and breath sounds normal. No respiratory distress.   20 individual insect bites are counted in his suprapubic region, bilateral  groin creases, on legs.        Assessment & Plan:  1. Tick bite Given the number of tick bites, I placed the patient on doxycycline 100 mg by mouth twice a day for 10 days. Gave him the signs and symptoms of [facility] spotted fever as well as Lyme disease. If he develops erythema migrans, he is to call me so that I can cause an additional 11 days of antibiotic. Also prescribed triamcinolone 0.1% cream to be applied 3 times a day as needed for itching to the tick bite. Follow up when necessary.

## 2012-11-24 ENCOUNTER — Other Ambulatory Visit: Payer: Self-pay | Admitting: Cardiology

## 2012-12-30 ENCOUNTER — Other Ambulatory Visit: Payer: Self-pay | Admitting: Family Medicine

## 2012-12-30 NOTE — Telephone Encounter (Signed)
No refill

## 2012-12-30 NOTE — Telephone Encounter (Signed)
Ok to refill 

## 2012-12-30 NOTE — Telephone Encounter (Signed)
No refill, ntbs if needed.

## 2012-12-31 NOTE — Telephone Encounter (Signed)
Spoke with pt and stated he did not need the prednisone at this time

## 2013-01-15 ENCOUNTER — Ambulatory Visit (INDEPENDENT_AMBULATORY_CARE_PROVIDER_SITE_OTHER): Payer: Medicare Other | Admitting: Family Medicine

## 2013-01-15 ENCOUNTER — Encounter: Payer: Self-pay | Admitting: Family Medicine

## 2013-01-15 VITALS — BP 98/52 | HR 82 | Temp 98.6°F | Resp 20 | Wt 184.0 lb

## 2013-01-15 DIAGNOSIS — B880 Other acariasis: Secondary | ICD-10-CM

## 2013-01-15 MED ORDER — PERMETHRIN 5 % EX CREA: TOPICAL_CREAM | Freq: Once | CUTANEOUS | Status: DC

## 2013-01-15 MED ORDER — PREDNISONE 20 MG PO TABS: ORAL_TABLET | ORAL | Status: DC

## 2013-01-15 NOTE — Progress Notes (Signed)
Subjective:    Patient ID: [name], male    DOB: 1935-04-03, 78 y.o.   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  He was working in tall grass and weeds.  Ever since he has had a widespread, red papular rash which is intensely pruritic.  The papules are 3-4 mm in size and have the characteristic appearance of insect bites.  He is concerned he may have gotten into chiggers.  He has tried over-the-counter cortisone creams with very little relief.  He denies any fevers or chills. The rash is mainly on his lower legs, his forearms, and his neck.  There are very few Lesions on areas that were covered by clothing.  Past Medical History  Diagnosis Date  . Atrial flutter     and SVT that is post ablation  . Acute venous embolism and thrombosis of unspecified deep vessels of lower extremity   . Dyslipidemia   . CAD (coronary artery disease)   . HTN (hypertension)    Current Outpatient Prescriptions on File Prior to Visit  Medication Sig Dispense Refill  . aspirin 325 MG tablet Take 325 mg by mouth daily.        Marland Kitchen doxycycline (VIBRA-TABS) 100 MG tablet Take 1 tablet (100 mg total) by mouth 2 (two) times daily.  20 tablet  0  . fluticasone (FLONASE) 50 MCG/ACT nasal spray       . lisinopril (PRINIVIL,ZESTRIL) 5 MG tablet Take 1 tablet (5 mg total) by mouth daily.  90 tablet  3  . metoprolol succinate (TOPROL-XL) 25 MG 24 hr tablet TAKE 1 TABLET EVERY DAY  30 tablet  11  . niacin (NIASPAN) 1000 MG CR tablet Take 1,000 mg by mouth daily.        . nitroGLYCERIN (NITROSTAT) 0.4 MG SL tablet Place 0.4 mg under the tongue every 5 (five) minutes as needed for chest pain.      Marland Kitchen omeprazole (PRILOSEC) 20 MG capsule Take 20 mg by mouth daily.        . simvastatin (ZOCOR) 40 MG tablet TAKE 1 TABLET (40 MG TOTAL) BY MOUTH AT BEDTIME.  30 tablet  6  . triamcinolone cream (KENALOG) 0.1 % Apply topically 2 (two) times daily.  30 g  0   No current facility-administered  medications on file prior to visit.   Allergies  Allergen Reactions  . Celebrex (Celecoxib) Rash    blisters   History   Social History  . Marital Status: Married    Spouse Name: N/A    Number of Children: N/A  . Years of Education: N/A   Occupational History  . Not on file.   Social History Main Topics  . Smoking status: Current Every Day Smoker  . Smokeless tobacco: Not on file     Comment: greater than 50 pack year history  . Alcohol Use: No  . Drug Use: No  . Sexually Active: Not on file   Other Topics Concern  . 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     Review of Systems  All other systems reviewed and are negative.       Objective:   Physical Exam  Vitals reviewed. Cardiovascular: Normal rate and regular rhythm.   Pulmonary/Chest: Effort normal and breath sounds normal.  Skin: Skin is  warm. Rash noted. There is erythema. No pallor.   3-4 mm papules that are on both lower legs, both forearms, his upper shoulders, and neck.          Assessment & Plan:  1. Chigger infestation The patient to apply Elimite cream over his body tonight and rinse off after 8 hours. He is to wash all his bedclothes, Linens, and clothing.  I will give the patient prednisone for itching and to calm the irritation. - permethrin (ELIMITE) 5 % cream; Apply topically once.  Dispense: 60 g; Refill: 0 - predniSONE (DELTASONE) 20 MG tablet; 3 tabs poqday 1-2, 2 days 3-4, 1 tab poqday 5-6  Dispense: 12 tablet; Refill: 0

## 2013-01-26 ENCOUNTER — Other Ambulatory Visit: Payer: Self-pay | Admitting: Cardiology

## 2013-01-28 ENCOUNTER — Encounter: Payer: Self-pay | Admitting: Family Medicine

## 2013-01-28 ENCOUNTER — Ambulatory Visit (INDEPENDENT_AMBULATORY_CARE_PROVIDER_SITE_OTHER): Payer: Medicare Other | Admitting: Family Medicine

## 2013-01-28 VITALS — BP 88/54 | HR 60 | Temp 98.9°F | Resp 18 | Wt 183.0 lb

## 2013-01-28 DIAGNOSIS — I959 Hypotension, unspecified: Secondary | ICD-10-CM

## 2013-01-28 DIAGNOSIS — R5383 Other fatigue: Secondary | ICD-10-CM

## 2013-01-28 DIAGNOSIS — W57XXXA Bitten or stung by nonvenomous insect and other nonvenomous arthropods, initial encounter: Secondary | ICD-10-CM

## 2013-01-28 DIAGNOSIS — R5381 Other malaise: Secondary | ICD-10-CM

## 2013-01-28 MED ORDER — DOXYCYCLINE HYCLATE 100 MG PO TABS: 100.0000 mg | ORAL_TABLET | Freq: Two times a day (BID) | ORAL | Status: DC

## 2013-01-28 NOTE — Progress Notes (Signed)
Subjective:    Patient ID: [name], male    DOB: 03-15-1935, 78 y.o.   MRN: [id number]  HPI Patient reports four-day of feeling increasingly weak. He has no energy. He reports fevers and chills. He reports dizziness with standing. His blood pressure is extremely low at 88/54. His heart rate is suppressed at 60 due to Metroprolol. He reports pain in his neck his shoulders his hips his knees and his ankles. He reports generalized myalgias and flulike symptoms. 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 He states he is pulled over 40 ticks off of his body this summer.  He has been bitten numerous times most recently one week ago.  He denies any rash. He denies any abdominal pain. He denies any nausea vomiting diarrhea or constipation. He denies any cough or shortness of breath. He denies any dysuria. He denies any sore throat, ear pain, sinus pain. Past Medical History  Diagnosis Date  . Atrial flutter     and SVT that is post ablation  . Acute venous embolism and thrombosis of unspecified deep vessels of lower extremity   . Dyslipidemia   . CAD (coronary artery disease)   . HTN (hypertension)    Current Outpatient Prescriptions on File Prior to Visit  Medication Sig Dispense Refill  . aspirin 325 MG tablet Take 325 mg by mouth daily.        . fluticasone (FLONASE) 50 MCG/ACT nasal spray       . lisinopril (PRINIVIL,ZESTRIL) 5 MG tablet Take 1 tablet (5 mg total) by mouth daily.  90 tablet  3  . metoprolol succinate (TOPROL-XL) 25 MG 24 hr tablet TAKE 1 TABLET EVERY DAY  30 tablet  11  . niacin (NIASPAN) 1000 MG CR tablet Take 1,000 mg by mouth daily.        . nitroGLYCERIN (NITROSTAT) 0.4 MG SL tablet Place 0.4 mg under the tongue every 5 (five) minutes as needed for chest pain.      Marland Kitchen omeprazole (PRILOSEC) 20 MG capsule Take 20 mg by mouth daily.        . simvastatin (ZOCOR) 40 MG tablet TAKE 1 TABLET (40 MG TOTAL) BY MOUTH AT BEDTIME.  30 tablet  6   No current  facility-administered medications on file prior to visit.   Allergies  Allergen Reactions  . Celebrex (Celecoxib) Rash    blisters   History   Social History  . Marital Status: Married    Spouse Name: N/A    Number of Children: N/A  . Years of Education: N/A   Occupational History  . Not on file.   Social History Main Topics  . Smoking status: Current Every Day Smoker  . Smokeless tobacco: Not on file     Comment: greater than 50 pack year history  . Alcohol Use: No  . Drug Use: No  . Sexually Active: Not on file   Other Topics Concern  . 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       Review of Systems  All other systems reviewed and are negative.       Objective:   Physical Exam  Vitals reviewed. Constitutional: He is oriented to person, place, and time. He appears well-developed. No distress.  HENT:  Head: Normocephalic and atraumatic.  Right Ear: External ear normal.  Left Ear: External ear normal.  Nose: Nose normal.  Mouth/Throat: Oropharynx is clear and moist. No oropharyngeal exudate.  Eyes: Conjunctivae and EOM are normal. Pupils are equal, round, and reactive to light. Right eye exhibits no discharge. Left eye exhibits no discharge. No scleral icterus.  Neck: Normal range of motion. Neck supple. No JVD present. No thyromegaly present.  Cardiovascular: Normal rate, regular rhythm, normal heart sounds and intact distal pulses.  Exam reveals no gallop and no friction rub.   No murmur heard. Pulmonary/Chest: Effort normal and breath sounds normal. No stridor. No respiratory distress. He has no wheezes. He has no rales. He exhibits no tenderness.  Abdominal: Soft. Bowel sounds are normal. He exhibits no distension and no mass. There is no tenderness. There is no rebound and no guarding.  Lymphadenopathy:    He has no  cervical adenopathy.  Neurological: He is alert and oriented to person, place, and time. He displays normal reflexes. No cranial nerve deficit. He exhibits normal muscle tone. Coordination normal.  Skin: Skin is warm. No rash noted. He is not diaphoretic. No erythema.   there is no meningismus.        Assessment & Plan:  1. Tick bite I am concerned the patient may be developing a tickborne illness such as Lyme disease a [place] spotted fever. I have recommended he begin doxycycline 100 mg by mouth twice a day for 10 days immediately. I will send laboratory studies as listed below. Believe this is the likely cause of his arthralgias and myalgias and low blood pressure the - CBC with Differential - COMPLETE METABOLIC PANEL WITH GFR - Sedimentation rate - B. burgdorfi antibodies by WB - [place] spotted fvr abs pnl(IgG+IgM) - doxycycline (VIBRA-TABS) 100 MG tablet; Take 1 tablet (100 mg total) by mouth 2 (two) times daily.  Dispense: 20 tablet; Refill: 0  2. Other malaise and fatigue Likely due to a combination of #1 as well as his hypertension. Discontinue the metoprolol and lisinopril temporarily.  Push Gatorade and fluids. Rest and stay out of the heat. Recheck tomorrow. Go to the emergency room if worse.  3. Hypotension, unspecified   See Above.

## 2013-01-29 LAB — B. BURGDORFI ANTIBODIES BY WB: B burgdorferi IgM Abs (IB): NEGATIVE

## 2013-01-29 LAB — COMPLETE METABOLIC PANEL WITH GFR
ALT: 24 U/L (ref 0–53)
AST: 21 U/L (ref 0–37)
CO2: 24 mEq/L (ref 19–32)
Calcium: 8.9 mg/dL (ref 8.4–10.5)
Chloride: 106 mEq/L (ref 96–112)
GFR, Est African American: 73 mL/min
Sodium: 140 mEq/L (ref 135–145)
Total Protein: 6.9 g/dL (ref 6.0–8.3)

## 2013-01-29 LAB — CBC WITH DIFFERENTIAL/PLATELET
Lymphocytes Relative: 27 % (ref 12–46)
Lymphs Abs: 3.7 10*3/uL (ref 0.7–4.0)
Neutro Abs: 7.5 10*3/uL (ref 1.7–7.7)
Neutrophils Relative %: 54 % (ref 43–77)
Platelets: 241 10*3/uL (ref 150–400)
RBC: 4.27 MIL/uL (ref 4.22–5.81)
WBC: 13.7 10*3/uL — ABNORMAL HIGH (ref 4.0–10.5)

## 2013-01-29 LAB — ROCKY MTN SPOTTED FVR ABS PNL(IGG+IGM): RMSF IgM: 0.6 IV

## 2013-02-04 ENCOUNTER — Encounter: Payer: Self-pay | Admitting: Family Medicine

## 2013-02-04 ENCOUNTER — Ambulatory Visit (INDEPENDENT_AMBULATORY_CARE_PROVIDER_SITE_OTHER): Payer: Medicare Other | Admitting: Family Medicine

## 2013-02-04 VITALS — BP 128/64 | HR 92 | Temp 97.4°F | Resp 16 | Wt 180.0 lb

## 2013-02-04 DIAGNOSIS — IMO0001 Reserved for inherently not codable concepts without codable children: Secondary | ICD-10-CM

## 2013-02-04 DIAGNOSIS — M791 Myalgia, unspecified site: Secondary | ICD-10-CM

## 2013-02-04 DIAGNOSIS — R509 Fever, unspecified: Secondary | ICD-10-CM

## 2013-02-04 DIAGNOSIS — M549 Dorsalgia, unspecified: Secondary | ICD-10-CM

## 2013-02-04 LAB — COMPLETE METABOLIC PANEL WITH GFR
ALT: 14 U/L (ref 0–53)
AST: 14 U/L (ref 0–37)
Alkaline Phosphatase: 70 U/L (ref 39–117)
BUN: 21 mg/dL (ref 6–23)
Calcium: 9.3 mg/dL (ref 8.4–10.5)
Creat: 1.36 mg/dL — ABNORMAL HIGH (ref 0.50–1.35)
Total Bilirubin: 0.6 mg/dL (ref 0.3–1.2)

## 2013-02-04 LAB — CBC WITH DIFFERENTIAL/PLATELET
Basophils Absolute: 0 10*3/uL (ref 0.0–0.1)
Basophils Relative: 0 % (ref 0–1)
Eosinophils Absolute: 0.3 10*3/uL (ref 0.0–0.7)
Eosinophils Relative: 3 % (ref 0–5)
HCT: 37.1 % — ABNORMAL LOW (ref 39.0–52.0)
Lymphocytes Relative: 27 % (ref 12–46)
MCH: 29.5 pg (ref 26.0–34.0)
MCHC: 34 g/dL (ref 30.0–36.0)
MCV: 86.9 fL (ref 78.0–100.0)
Monocytes Absolute: 1.3 10*3/uL — ABNORMAL HIGH (ref 0.1–1.0)
Platelets: 339 10*3/uL (ref 150–400)
RDW: 14.9 % (ref 11.5–15.5)
WBC: 9.8 10*3/uL (ref 4.0–10.5)

## 2013-02-04 LAB — URINALYSIS, ROUTINE W REFLEX MICROSCOPIC
Ketones, ur: NEGATIVE mg/dL
Nitrite: NEGATIVE
Protein, ur: 30 mg/dL — AB
Specific Gravity, Urine: 1.02 (ref 1.005–1.030)
Urobilinogen, UA: 1 mg/dL (ref 0.0–1.0)

## 2013-02-04 LAB — URINALYSIS, MICROSCOPIC ONLY: Crystals: NONE SEEN

## 2013-02-04 MED ORDER — CIPROFLOXACIN HCL 500 MG PO TABS: 500.0000 mg | ORAL_TABLET | Freq: Two times a day (BID) | ORAL | Status: DC

## 2013-02-04 NOTE — Progress Notes (Signed)
Subjective:    Patient ID: [name], male    DOB: 05-Jun-1935, 78 y.o.   MRN: [id number]  HPI 01/28/13 Patient reports four-day of feeling increasingly weak. He has no energy. He reports fevers and chills. He reports dizziness with standing. His blood pressure is extremely low at 88/54. His heart rate is suppressed at 60 due to Metroprolol. He reports pain in his neck his shoulders his hips his knees and his ankles. He reports generalized myalgias and flulike symptoms. 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 He states he is pulled over 40 ticks off of his body this summer.  He has been bitten numerous times most recently one week ago.  He denies any rash. He denies any abdominal pain. He denies any nausea vomiting diarrhea or constipation. He denies any cough or shortness of breath. He denies any dysuria. He denies any sore throat, ear pain, sinus pain.  At that time, my plan was: 1. Tick bite I am concerned the patient may be developing a tickborne illness such as Lyme disease a [place] spotted fever. I have recommended he begin doxycycline 100 mg by mouth twice a day for 10 days immediately. I will send laboratory studies as listed below. Believe this is the likely cause of his arthralgias and myalgias and low blood pressure the - CBC with Differential - COMPLETE METABOLIC PANEL WITH GFR - Sedimentation rate - B. burgdorfi antibodies by WB - [place] spotted fvr abs pnl(IgG+IgM) - doxycycline (VIBRA-TABS) 100 MG tablet; Take 1 tablet (100 mg total) by mouth 2 (two) times daily.  Dispense: 20 tablet; Refill: 0  2. Other malaise and fatigue Likely due to a combination of #1 as well as his hypertension. Discontinue the metoprolol and lisinopril temporarily.  Push Gatorade and fluids. Rest and stay out of the heat. Recheck tomorrow. Go to the emergency room if worse.  3. Hypotension, unspecified   See Above.  02/04/13 He is here today for a recheck.  He continues to complain of pain  and stiffness in his neck and shoulders. He is also developed low back pain. He has CVA tenderness on exam. He is also now reporting polyuria and frequency. He continues to deny dysuria.  His lab work from the last encounter is reviewed with the patient.  His labs are significant for an elevated white blood cell count, and elevated sedimentation rate, and negative tickborne titers. He is finished 7 days of doxycycline with no improvement. He denies cough, shortness of breath, nausea, vomiting, diarrhea, constipation, sore throat, otalgia, sinusitis. He denies any rash. He is having no aches or pains in his hips, knees, feet, elbows. It is limited to his shoulders and lower back.   Past Medical History  Diagnosis Date  . Atrial flutter     and SVT that is post ablation  . Acute venous embolism and thrombosis of unspecified deep vessels of lower extremity   . Dyslipidemia   . CAD (coronary artery disease)   . HTN (hypertension)    Current Outpatient Prescriptions on File Prior to Visit  Medication Sig Dispense Refill  . aspirin 325 MG tablet Take 325 mg by mouth daily.        Marland Kitchen doxycycline (VIBRA-TABS) 100 MG tablet Take 1 tablet (100 mg total) by mouth 2 (two) times daily.  20 tablet  0  . fluticasone (FLONASE) 50 MCG/ACT nasal spray       . niacin (NIASPAN) 1000 MG CR tablet Take 1,000 mg  by mouth daily.        . nitroGLYCERIN (NITROSTAT) 0.4 MG SL tablet Place 0.4 mg under the tongue every 5 (five) minutes as needed for chest pain.      Marland Kitchen omeprazole (PRILOSEC) 20 MG capsule Take 20 mg by mouth daily.        . simvastatin (ZOCOR) 40 MG tablet TAKE 1 TABLET (40 MG TOTAL) BY MOUTH AT BEDTIME.  30 tablet  6  . lisinopril (PRINIVIL,ZESTRIL) 5 MG tablet Take 1 tablet (5 mg total) by mouth daily.  90 tablet  3  . metoprolol succinate (TOPROL-XL) 25 MG 24 hr tablet TAKE 1 TABLET EVERY DAY  30 tablet  11   No current facility-administered medications on file prior to visit.   Allergies  Allergen  Reactions  . Celebrex [Celecoxib] Rash    blisters   History   Social History  . Marital Status: Married    Spouse Name: N/A    Number of Children: N/A  . Years of Education: N/A   Occupational History  . Not on file.   Social History Main Topics  . Smoking status: Current Every Day Smoker  . Smokeless tobacco: Not on file     Comment: greater than 50 pack year history  . Alcohol Use: No  . Drug Use: No  . Sexual Activity: Not on file   Other Topics Concern  . 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       Review of Systems  All other systems reviewed and are negative.       Objective:   Physical Exam  Vitals reviewed. Constitutional: He is oriented to person, place, and time. He appears well-developed. No distress.  HENT:  Head: Normocephalic and atraumatic.  Right Ear: External ear normal.  Left Ear: External ear normal.  Nose: Nose normal.  Mouth/Throat: Oropharynx is clear and moist. No oropharyngeal exudate.  Eyes: Conjunctivae and EOM are normal. Pupils are equal, round, and reactive to light. Right eye exhibits no discharge. Left eye exhibits no discharge. No scleral icterus.  Neck: Normal range of motion. Neck supple. No JVD present. No thyromegaly present.  Cardiovascular: Normal rate, regular rhythm, normal heart sounds and intact distal pulses.  Exam reveals no gallop and no friction rub.   No murmur heard. Pulmonary/Chest: Effort normal and breath sounds normal. No stridor. No respiratory distress. He has no wheezes. He has no rales. He exhibits no tenderness.  Abdominal: Soft. Bowel sounds are normal. He exhibits no distension and no mass. There is no tenderness. There is no rebound and no guarding.  Lymphadenopathy:    He has no cervical adenopathy.  Neurological: He is alert and oriented to person, place, and  time. He displays normal reflexes. No cranial nerve deficit. He exhibits normal muscle tone. Coordination normal.  Skin: Skin is warm. No rash noted. He is not diaphoretic. No erythema.   there is no meningismus. Exam is now significant for CVA tenderness bilaterally.       Assessment & Plan:  1. Fever, unspecified I am concerned the patient may have signs of systemic illness from prostatitis. He does have a history of urinary tract infections from enterococcus. We'll check a urinalysis and a urine culture.  Urinalysis shows blood and protein. I will send a urine culture. In the meantime I am going  to start the patient on Cipro 500 mg by mouth twice a day for 10 days.  He has a history of 2 enterococcus urinary tract infections and I'm concerned by his CVA tenderness. - CULTURE, URINE COMPREHENSIVE - Urinalysis, Routine w reflex microscopic  2. Back pain Back pain could be related to #1 possibly #3.  Stop Zocor and treat with Cipro.  3. Myalgia Differential diagnosis includes myalgias due  to systemic illness versus rheumatologic condition such as polymyalgia rheumatica versus myalgias from statins.  I will check a CK level and have the patient temporarily discontinue Zocor.

## 2013-02-04 NOTE — Patient Instructions (Addendum)
Your urine shows blood which may be from infection.  Stop doxycycline and start cipro 500 mg twice a day.  I have sent a urine culture and will call you as soon as it returns.  Also, stop zocor (simvastatin) as it could cause all of your muscle aches and joint pains.  Recheck Monday.

## 2013-02-05 LAB — CULTURE, URINE COMPREHENSIVE
Colony Count: NO GROWTH
Organism ID, Bacteria: NO GROWTH

## 2013-02-09 ENCOUNTER — Encounter: Payer: Self-pay | Admitting: Family Medicine

## 2013-02-09 ENCOUNTER — Ambulatory Visit (INDEPENDENT_AMBULATORY_CARE_PROVIDER_SITE_OTHER): Payer: Medicare Other | Admitting: Family Medicine

## 2013-02-09 VITALS — BP 100/68 | HR 82 | Temp 98.0°F | Resp 16 | Wt 181.0 lb

## 2013-02-09 DIAGNOSIS — R1032 Left lower quadrant pain: Secondary | ICD-10-CM

## 2013-02-09 DIAGNOSIS — M791 Myalgia, unspecified site: Secondary | ICD-10-CM

## 2013-02-09 DIAGNOSIS — IMO0001 Reserved for inherently not codable concepts without codable children: Secondary | ICD-10-CM

## 2013-02-09 DIAGNOSIS — R319 Hematuria, unspecified: Secondary | ICD-10-CM

## 2013-02-09 NOTE — Progress Notes (Signed)
Subjective:    Patient ID: [name], male    DOB: 05/31/1935, 78 y.o.   MRN: [id number]  HPI 01/28/13 Patient reports four-day of feeling increasingly weak. He has no energy. He reports fevers and chills. He reports dizziness with standing. His blood pressure is extremely low at 88/54. His heart rate is suppressed at 60 due to Metroprolol. He reports pain in his neck his shoulders his hips his knees and his ankles. He reports generalized myalgias and flulike symptoms. 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 He states he is pulled over 40 ticks off of his body this summer.  He has been bitten numerous times most recently one week ago.  He denies any rash. He denies any abdominal pain. He denies any nausea vomiting diarrhea or constipation. He denies any cough or shortness of breath. He denies any dysuria. He denies any sore throat, ear pain, sinus pain.  At that time, my plan was: 1. Tick bite I am concerned the patient may be developing a tickborne illness such as Lyme disease a [place] spotted fever. I have recommended he begin doxycycline 100 mg by mouth twice a day for 10 days immediately. I will send laboratory studies as listed below. Believe this is the likely cause of his arthralgias and myalgias and low blood pressure the - CBC with Differential - COMPLETE METABOLIC PANEL WITH GFR - Sedimentation rate - B. burgdorfi antibodies by WB - [place] spotted fvr abs pnl(IgG+IgM) - doxycycline (VIBRA-TABS) 100 MG tablet; Take 1 tablet (100 mg total) by mouth 2 (two) times daily.  Dispense: 20 tablet; Refill: 0  2. Other malaise and fatigue Likely due to a combination of #1 as well as his hypertension. Discontinue the metoprolol and lisinopril temporarily.  Push Gatorade and fluids. Rest and stay out of the heat. Recheck tomorrow. Go to the emergency room if worse.  3. Hypotension, unspecified   See Above.  02/04/13 He is here today for a recheck.  He continues to complain of pain  and stiffness in his neck and shoulders. He is also developed low back pain. He has CVA tenderness on exam. He is also now reporting polyuria and frequency. He continues to deny dysuria.  His lab work from the last encounter is reviewed with the patient.  His labs are significant for an elevated white blood cell count, and elevated sedimentation rate, and negative tickborne titers. He is finished 7 days of doxycycline with no improvement. He denies cough, shortness of breath, nausea, vomiting, diarrhea, constipation, sore throat, otalgia, sinusitis. He denies any rash. He is having no aches or pains in his hips, knees, feet, elbows. It is limited to his shoulders and lower back.  At that time, my plan was: 1. Fever, unspecified I am concerned the patient may have signs of systemic illness from prostatitis. He does have a history of urinary tract infections from enterococcus. We'll check a urinalysis and a urine culture.  Urinalysis shows blood and protein. I will send a urine culture. In the meantime I am going to start the patient on Cipro 500 mg by mouth twice a day for 10 days.  He has a history of 2 enterococcus urinary tract infections and I'm concerned by his CVA tenderness. - CULTURE, URINE COMPREHENSIVE - Urinalysis, Routine w reflex microscopic  2. Back pain Back pain could be related to #1 possibly #3.  Stop Zocor and treat with Cipro.  3. Myalgia Differential diagnosis includes myalgias due  to  systemic illness versus rheumatologic condition such as polymyalgia rheumatica versus myalgias from statins.  I will check a CK level and have the patient temporarily discontinue Zocor. Office Visit on 02/04/2013  Component Date Value Range Status  . Colony Count 02/04/2013 NO GROWTH   Final  . Organism ID, Bacteria 02/04/2013 NO GROWTH   Final  . Color, Urine 02/04/2013 YELLOW  YELLOW Final  . APPearance 02/04/2013 CLEAR  CLEAR Final  . Specific Gravity, Urine 02/04/2013 1.020  1.005 - 1.030 Final   . pH 02/04/2013 5.5  5.0 - 8.0 Final  . Glucose, UA 02/04/2013 NEG  NEG mg/dL Final  . Bilirubin Urine 02/04/2013 NEG  NEG Final  . Ketones, ur 02/04/2013 NEG  NEG mg/dL Final  . Hgb urine dipstick 02/04/2013 SMALL* NEG Final  . Protein, ur 02/04/2013 30* NEG mg/dL Final  . Urobilinogen, UA 02/04/2013 1  0.0 - 1.0 mg/dL Final  . Nitrite 16/03/9603 NEG  NEG Final  . Leukocytes, UA 02/04/2013 NEG  NEG Final  . Total CK 02/04/2013 21  7 - 232 U/L Final  . WBC 02/04/2013 9.8  4.0 - 10.5 K/uL Final  . RBC 02/04/2013 4.27  4.22 - 5.81 MIL/uL Final  . Hemoglobin 02/04/2013 12.6* 13.0 - 17.0 g/dL Final  . HCT 54/02/8118 37.1* 39.0 - 52.0 % Final  . MCV 02/04/2013 86.9  78.0 - 100.0 fL Final  . MCH 02/04/2013 29.5  26.0 - 34.0 pg Final  . MCHC 02/04/2013 34.0  30.0 - 36.0 g/dL Final  . RDW 14/78/2956 14.9  11.5 - 15.5 % Final  . Platelets 02/04/2013 339  150 - 400 K/uL Final  . Neutrophils Relative % 02/04/2013 57  43 - 77 % Final  . Neutro Abs 02/04/2013 5.6  1.7 - 7.7 K/uL Final  . Lymphocytes Relative 02/04/2013 27  12 - 46 % Final  . Lymphs Abs 02/04/2013 2.6  0.7 - 4.0 K/uL Final  . Monocytes Relative 02/04/2013 13* 3 - 12 % Final  . Monocytes Absolute 02/04/2013 1.3* 0.1 - 1.0 K/uL Final  . Eosinophils Relative 02/04/2013 3  0 - 5 % Final  . Eosinophils Absolute 02/04/2013 0.3  0.0 - 0.7 K/uL Final  . Basophils Relative 02/04/2013 0  0 - 1 % Final  . Basophils Absolute 02/04/2013 0.0  0.0 - 0.1 K/uL Final  . Smear Review 02/04/2013 Criteria for review not met   Final  . Sodium 02/04/2013 137  135 - 145 mEq/L Final  . Potassium 02/04/2013 5.3  3.5 - 5.3 mEq/L Final  . Chloride 02/04/2013 103  96 - 112 mEq/L Final  . CO2 02/04/2013 25  19 - 32 mEq/L Final  . Glucose, Bld 02/04/2013 98  70 - 99 mg/dL Final  . BUN 21/30/8657 21  6 - 23 mg/dL Final  . Creat 84/69/6295 1.36* 0.50 - 1.35 mg/dL Final  . Total Bilirubin 02/04/2013 0.6  0.3 - 1.2 mg/dL Final  . Alkaline Phosphatase  02/04/2013 70  39 - 117 U/L Final  . AST 02/04/2013 14  0 - 37 U/L Final  . ALT 02/04/2013 14  0 - 53 U/L Final  . Total Protein 02/04/2013 7.1  6.0 - 8.3 g/dL Final  . Albumin 28/41/3244 3.6  3.5 - 5.2 g/dL Final  . Calcium 06/26/7251 9.3  8.4 - 10.5 mg/dL Final  . GFR, Est African American 02/04/2013 58*  Final  . GFR, Est Non African American 02/04/2013 50*  Final   Comment:  The estimated GFR is a calculation valid for adults (>=72 years old)                          that uses the CKD-EPI algorithm to adjust for age and sex. It is                            not to be used for children, pregnant women, hospitalized patients,                             patients on dialysis, or with rapidly changing kidney function.                          According to the NKDEP, eGFR >89 is normal, 60-89 shows mild                          impairment, 30-59 shows moderate impairment, 15-29 shows severe                          impairment and <15 is ESRD.                             Marland Kitchen Squamous Epithelial / LPF 02/04/2013 RARE  RARE Final  . Crystals 02/04/2013 NONE SEEN  NONE SEEN Final  . Casts 02/04/2013 Hyaline casts noted  NONE SEEN Final  . WBC, UA 02/04/2013 0-2  <3 WBC/hpf Final  . RBC / HPF 02/04/2013 0-2  <3 RBC/hpf Final  . Bacteria, UA 02/04/2013 FEW* RARE Final      02/09/13 Patient states he is no better. He continues to have severe pain in his left lower back. He is also having left lower quadrant abdominal pain. He denies dysuria. The polyuria has improved. The frequency has improved. He denies any gross hematuria. He denies any constipation. He denies any hematochezia. He denies any melena. He continues to have subjective fever and episodes of intense sweating. He is also having pain and stiffness in his neck along with his lower back. Past Medical History  Diagnosis Date  . Atrial flutter     and SVT that is post ablation  . Acute venous embolism and  thrombosis of unspecified deep vessels of lower extremity   . Dyslipidemia   . CAD (coronary artery disease)   . HTN (hypertension)    Current Outpatient Prescriptions on File Prior to Visit  Medication Sig Dispense Refill  . aspirin 325 MG tablet Take 325 mg by mouth daily.        . ciprofloxacin (CIPRO) 500 MG tablet Take 1 tablet (500 mg total) by mouth 2 (two) times daily.  20 tablet  0  . fluticasone (FLONASE) 50 MCG/ACT nasal spray       . lisinopril (PRINIVIL,ZESTRIL) 5 MG tablet Take 1 tablet (5 mg total) by mouth daily.  90 tablet  3  . metoprolol succinate (TOPROL-XL) 25 MG 24 hr tablet TAKE 1 TABLET EVERY DAY  30 tablet  11  . niacin (NIASPAN) 1000 MG CR tablet Take 1,000 mg by mouth daily.        . nitroGLYCERIN (NITROSTAT) 0.4 MG SL tablet Place 0.4 mg under the tongue every 5 (five) minutes as needed  for chest pain.      Marland Kitchen omeprazole (PRILOSEC) 20 MG capsule Take 20 mg by mouth daily.        . simvastatin (ZOCOR) 40 MG tablet TAKE 1 TABLET (40 MG TOTAL) BY MOUTH AT BEDTIME.  30 tablet  6   No current facility-administered medications on file prior to visit.   Allergies  Allergen Reactions  . Celebrex [Celecoxib] Rash    blisters   History   Social History  . Marital Status: Married    Spouse Name: N/A    Number of Children: N/A  . Years of Education: N/A   Occupational History  . Not on file.   Social History Main Topics  . Smoking status: Current Every Day Smoker  . Smokeless tobacco: Not on file     Comment: greater than 50 pack year history  . Alcohol Use: No  . Drug Use: No  . Sexual Activity: Not on file   Other Topics Concern  . 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       Review of Systems  All other systems reviewed and are negative.       Objective:   Physical Exam  Vitals reviewed. Constitutional:  He is oriented to person, place, and time. He appears well-developed. No distress.  HENT:  Head: Normocephalic and atraumatic.  Right Ear: External ear normal.  Left Ear: External ear normal.  Nose: Nose normal.  Mouth/Throat: Oropharynx is clear and moist. No oropharyngeal exudate.  Eyes: Conjunctivae and EOM are normal. Pupils are equal, round, and reactive to light. Right eye exhibits no discharge. Left eye exhibits no discharge. No scleral icterus.  Neck: Normal range of motion. Neck supple. No JVD present. No thyromegaly present.  Cardiovascular: Normal rate, regular rhythm, normal heart sounds and intact distal pulses.  Exam reveals no gallop and no friction rub.   No murmur heard. Pulmonary/Chest: Effort normal and breath sounds normal. No stridor. No respiratory distress. He has no wheezes. He has no rales. He exhibits no tenderness.  Abdominal: Soft. Bowel sounds are normal. He exhibits no distension and no mass. There is tenderness (patient is tender to palpation in the left lower quadrant with voluntary guarding). There is guarding. There is no rebound.  Lymphadenopathy:    He has no cervical adenopathy.  Neurological: He is alert and oriented to person, place, and time. He displays normal reflexes. No cranial nerve deficit. He exhibits normal muscle tone. Coordination normal.  Skin: Skin is warm. No rash noted. He is not diaphoretic. No erythema.   Patient also has left-sided CVA tenderness     Assessment & Plan:  1. Hematuria - CT Abdomen Pelvis W Contrast; Future  2. Abdominal pain, left lower quadrant I am concerned that the patient has some smoldering source of information that is causing symptoms consistent with peritonitis. I recommended a CT of the abdomen and pelvis with contrast as soon as possible to delineate the source whether it be diverticulitis, pyelonephritis, etc. In the meantime I requested that he continue the Cipro. I also recommended he continue to refrain  from using his blood pressure medication if his blood pressure is still slightly low. - CT Abdomen Pelvis W Contrast; Future  3. Myalgia The patient's myalgia is better. I recommended he continue to refrain from using simvastatin. He does not have  symptoms consistent with polymyalgia rheumatica the present time. I believe the majority of his aches and pains is related to some source of systemic illness that is causing his abdominal pain.

## 2013-02-10 ENCOUNTER — Ambulatory Visit
Admission: RE | Admit: 2013-02-10 | Discharge: 2013-02-10 | Disposition: A | Discharge status: Home / Self Care | Payer: Medicare Other | Source: Ambulatory Visit | Attending: Family Medicine | Admitting: Family Medicine

## 2013-02-10 DIAGNOSIS — R319 Hematuria, unspecified: Secondary | ICD-10-CM

## 2013-02-10 DIAGNOSIS — R1032 Left lower quadrant pain: Secondary | ICD-10-CM

## 2013-02-10 MED ORDER — IOHEXOL 300 MG/ML  SOLN
100.0000 mL | Freq: Once | INTRAMUSCULAR | Status: AC | PRN
  Administered 2013-02-10: 100 mL via INTRAVENOUS

## 2013-02-12 ENCOUNTER — Other Ambulatory Visit: Payer: Self-pay | Admitting: Family Medicine

## 2013-02-12 MED ORDER — PREDNISONE 20 MG PO TABS: 20.0000 mg | ORAL_TABLET | Freq: Every day | ORAL | Status: DC

## 2013-02-12 NOTE — Telephone Encounter (Signed)
Med sent to pharmacy.

## 2013-02-16 ENCOUNTER — Encounter: Payer: Self-pay | Admitting: Family Medicine

## 2013-02-16 ENCOUNTER — Ambulatory Visit (INDEPENDENT_AMBULATORY_CARE_PROVIDER_SITE_OTHER): Payer: Medicare Other | Admitting: Family Medicine

## 2013-02-16 VITALS — BP 100/58 | HR 60 | Temp 97.5°F | Resp 16 | Wt 186.0 lb

## 2013-02-16 DIAGNOSIS — IMO0001 Reserved for inherently not codable concepts without codable children: Secondary | ICD-10-CM

## 2013-02-16 LAB — CBC WITH DIFFERENTIAL/PLATELET
Basophils Relative: 1 % (ref 0–1)
Eosinophils Absolute: 0.2 10*3/uL (ref 0.0–0.7)
Eosinophils Relative: 2 % (ref 0–5)
Lymphs Abs: 3.2 10*3/uL (ref 0.7–4.0)
MCH: 29.2 pg (ref 26.0–34.0)
MCHC: 33 g/dL (ref 30.0–36.0)
MCV: 88.4 fL (ref 78.0–100.0)
Monocytes Relative: 10 % (ref 3–12)
Neutrophils Relative %: 53 % (ref 43–77)
Platelets: 324 10*3/uL (ref 150–400)
RBC: 4.15 MIL/uL — ABNORMAL LOW (ref 4.22–5.81)

## 2013-02-16 LAB — COMPLETE METABOLIC PANEL WITH GFR
Alkaline Phosphatase: 61 U/L (ref 39–117)
CO2: 26 mEq/L (ref 19–32)
Creat: 1.35 mg/dL (ref 0.50–1.35)
GFR, Est African American: 59 mL/min — ABNORMAL LOW
GFR, Est Non African American: 51 mL/min — ABNORMAL LOW
Glucose, Bld: 89 mg/dL (ref 70–99)
Total Bilirubin: 0.3 mg/dL (ref 0.3–1.2)

## 2013-02-16 MED ORDER — PREDNISONE 10 MG PO TABS: 10.0000 mg | ORAL_TABLET | Freq: Every day | ORAL | Status: DC

## 2013-02-16 NOTE — Progress Notes (Signed)
Subjective:    Patient ID: [name], male    DOB: 04-08-1935, 78 y.o.   MRN: [id number]  HPI 01/28/13 Patient reports four-day of feeling increasingly weak. He has no energy. He reports fevers and chills. He reports dizziness with standing. His blood pressure is extremely low at 88/54. His heart rate is suppressed at 60 due to Metroprolol. He reports pain in his neck his shoulders his hips his knees and his ankles. He reports generalized myalgias and flulike symptoms. 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 He states he is pulled over 40 ticks off of his body this summer.  He has been bitten numerous times most recently one week ago.  He denies any rash. He denies any abdominal pain. He denies any nausea vomiting diarrhea or constipation. He denies any cough or shortness of breath. He denies any dysuria. He denies any sore throat, ear pain, sinus pain.  At that time, my plan was: 1. Tick bite I am concerned the patient may be developing a tickborne illness such as Lyme disease a [place] spotted fever. I have recommended he begin doxycycline 100 mg by mouth twice a day for 10 days immediately. I will send laboratory studies as listed below. Believe this is the likely cause of his arthralgias and myalgias and low blood pressure the - CBC with Differential - COMPLETE METABOLIC PANEL WITH GFR - Sedimentation rate - B. burgdorfi antibodies by WB - [place] spotted fvr abs pnl(IgG+IgM) - doxycycline (VIBRA-TABS) 100 MG tablet; Take 1 tablet (100 mg total) by mouth 2 (two) times daily.  Dispense: 20 tablet; Refill: 0  2. Other malaise and fatigue Likely due to a combination of #1 as well as his hypertension. Discontinue the metoprolol and lisinopril temporarily.  Push Gatorade and fluids. Rest and stay out of the heat. Recheck tomorrow. Go to the emergency room if worse.  3. Hypotension, unspecified   See Above.  02/04/13 He is here today for a recheck.  He continues to complain of pain  and stiffness in his neck and shoulders. He is also developed low back pain. He has CVA tenderness on exam. He is also now reporting polyuria and frequency. He continues to deny dysuria.  His lab work from the last encounter is reviewed with the patient.  His labs are significant for an elevated white blood cell count, and elevated sedimentation rate, and negative tickborne titers. He is finished 7 days of doxycycline with no improvement. He denies cough, shortness of breath, nausea, vomiting, diarrhea, constipation, sore throat, otalgia, sinusitis. He denies any rash. He is having no aches or pains in his hips, knees, feet, elbows. It is limited to his shoulders and lower back.  At that time, my plan was: 1. Fever, unspecified I am concerned the patient may have signs of systemic illness from prostatitis. He does have a history of urinary tract infections from enterococcus. We'll check a urinalysis and a urine culture.  Urinalysis shows blood and protein. I will send a urine culture. In the meantime I am going to start the patient on Cipro 500 mg by mouth twice a day for 10 days.  He has a history of 2 enterococcus urinary tract infections and I'm concerned by his CVA tenderness. - CULTURE, URINE COMPREHENSIVE - Urinalysis, Routine w reflex microscopic  2. Back pain Back pain could be related to #1 possibly #3.  Stop Zocor and treat with Cipro.  3. Myalgia Differential diagnosis includes myalgias due  to  systemic illness versus rheumatologic condition such as polymyalgia rheumatica versus myalgias from statins.  I will check a CK level and have the patient temporarily discontinue Zocor. No visits with results within 1 Week(s) from this visit. Latest known visit with results is:  Office Visit on 02/04/2013  Component Date Value Range Status  . Colony Count 02/04/2013 NO GROWTH   Final  . Organism ID, Bacteria 02/04/2013 NO GROWTH   Final  . Color, Urine 02/04/2013 YELLOW  YELLOW Final  .  APPearance 02/04/2013 CLEAR  CLEAR Final  . Specific Gravity, Urine 02/04/2013 1.020  1.005 - 1.030 Final  . pH 02/04/2013 5.5  5.0 - 8.0 Final  . Glucose, UA 02/04/2013 NEG  NEG mg/dL Final  . Bilirubin Urine 02/04/2013 NEG  NEG Final  . Ketones, ur 02/04/2013 NEG  NEG mg/dL Final  . Hgb urine dipstick 02/04/2013 SMALL* NEG Final  . Protein, ur 02/04/2013 30* NEG mg/dL Final  . Urobilinogen, UA 02/04/2013 1  0.0 - 1.0 mg/dL Final  . Nitrite 91/47/8295 NEG  NEG Final  . Leukocytes, UA 02/04/2013 NEG  NEG Final  . Total CK 02/04/2013 21  7 - 232 U/L Final  . WBC 02/04/2013 9.8  4.0 - 10.5 K/uL Final  . RBC 02/04/2013 4.27  4.22 - 5.81 MIL/uL Final  . Hemoglobin 02/04/2013 12.6* 13.0 - 17.0 g/dL Final  . HCT 62/13/0865 37.1* 39.0 - 52.0 % Final  . MCV 02/04/2013 86.9  78.0 - 100.0 fL Final  . MCH 02/04/2013 29.5  26.0 - 34.0 pg Final  . MCHC 02/04/2013 34.0  30.0 - 36.0 g/dL Final  . RDW 78/46/9629 14.9  11.5 - 15.5 % Final  . Platelets 02/04/2013 339  150 - 400 K/uL Final  . Neutrophils Relative % 02/04/2013 57  43 - 77 % Final  . Neutro Abs 02/04/2013 5.6  1.7 - 7.7 K/uL Final  . Lymphocytes Relative 02/04/2013 27  12 - 46 % Final  . Lymphs Abs 02/04/2013 2.6  0.7 - 4.0 K/uL Final  . Monocytes Relative 02/04/2013 13* 3 - 12 % Final  . Monocytes Absolute 02/04/2013 1.3* 0.1 - 1.0 K/uL Final  . Eosinophils Relative 02/04/2013 3  0 - 5 % Final  . Eosinophils Absolute 02/04/2013 0.3  0.0 - 0.7 K/uL Final  . Basophils Relative 02/04/2013 0  0 - 1 % Final  . Basophils Absolute 02/04/2013 0.0  0.0 - 0.1 K/uL Final  . Smear Review 02/04/2013 Criteria for review not met   Final  . Sodium 02/04/2013 137  135 - 145 mEq/L Final  . Potassium 02/04/2013 5.3  3.5 - 5.3 mEq/L Final  . Chloride 02/04/2013 103  96 - 112 mEq/L Final  . CO2 02/04/2013 25  19 - 32 mEq/L Final  . Glucose, Bld 02/04/2013 98  70 - 99 mg/dL Final  . BUN 52/84/1324 21  6 - 23 mg/dL Final  . Creat 40/03/2724 1.36* 0.50 -  1.35 mg/dL Final  . Total Bilirubin 02/04/2013 0.6  0.3 - 1.2 mg/dL Final  . Alkaline Phosphatase 02/04/2013 70  39 - 117 U/L Final  . AST 02/04/2013 14  0 - 37 U/L Final  . ALT 02/04/2013 14  0 - 53 U/L Final  . Total Protein 02/04/2013 7.1  6.0 - 8.3 g/dL Final  . Albumin 36/64/4034 3.6  3.5 - 5.2 g/dL Final  . Calcium 74/25/9563 9.3  8.4 - 10.5 mg/dL Final  . GFR, Est African American 02/04/2013 58*  Final  .  GFR, Est Non African American 02/04/2013 50*  Final   Comment:                            The estimated GFR is a calculation valid for adults (>=70 years old)                          that uses the CKD-EPI algorithm to adjust for age and sex. It is                            not to be used for children, pregnant women, hospitalized patients,                             patients on dialysis, or with rapidly changing kidney function.                          According to the NKDEP, eGFR >89 is normal, 60-89 shows mild                          impairment, 30-59 shows moderate impairment, 15-29 shows severe                          impairment and <15 is ESRD.                             Marland Kitchen Squamous Epithelial / LPF 02/04/2013 RARE  RARE Final  . Crystals 02/04/2013 NONE SEEN  NONE SEEN Final  . Casts 02/04/2013 Hyaline casts noted  NONE SEEN Final  . WBC, UA 02/04/2013 0-2  <3 WBC/hpf Final  . RBC / HPF 02/04/2013 0-2  <3 RBC/hpf Final  . Bacteria, UA 02/04/2013 FEW* RARE Final      02/09/13 Patient states he is no better. He continues to have severe pain in his left lower back. He is also having left lower quadrant abdominal pain. He denies dysuria. The polyuria has improved. The frequency has improved. He denies any gross hematuria. He denies any constipation. He denies any hematochezia. He denies any melena. He continues to have subjective fever and episodes of intense sweating. He is also having pain and stiffness in his neck along with his lower back.  At that time, my plan  was: 1. Hematuria - CT Abdomen Pelvis W Contrast; Future  2. Abdominal pain, left lower quadrant I am concerned that the patient has some smoldering source of information that is causing symptoms consistent with peritonitis. I recommended a CT of the abdomen and pelvis with contrast as soon as possible to delineate the source whether it be diverticulitis, pyelonephritis, etc. In the meantime I requested that he continue the Cipro. I also recommended he continue to refrain from using his blood pressure medication if his blood pressure is still slightly low. - CT Abdomen Pelvis W Contrast; Future  3. Myalgia The patient's myalgia is better. I recommended he continue to refrain from using simvastatin. He does not have symptoms consistent with polymyalgia rheumatica the present time. I believe the majority of his aches and pains is related to some source of systemic illness that is causing his abdominal pain.  02/16/13 CT scan results are  listed below; 1. Nonobstructing left renal calculi are noted but no  hydronephrosis is seen. No ureteral calculus is evident.  2. No abnormality of the urinary bladder is seen.  3. Rectosigmoid and distal descending colon diverticula. No  diverticulitis.  4. The appendix and terminal ileum are unremarkable.  Since taking the Cipro, the patient's abdominal pain has improved. Furthermore his nocturia has improved and essentially stopped. He denies any further hematuria. Since starting the prednisone and discontinuing the statin his low back pain is 75% better. Is not 100% better hence I therefore do not feel that this is PMR. It is consistent with someone having low back pain related to degenerative disc disease and arthritis coupled with myalgias due to statin.  In the respective patient feels much better and is having no further fevers Past Medical History  Diagnosis Date  . Atrial flutter     and SVT that is post ablation  . Acute venous embolism and thrombosis  of unspecified deep vessels of lower extremity   . Dyslipidemia   . CAD (coronary artery disease)   . HTN (hypertension)    Current Outpatient Prescriptions on File Prior to Visit  Medication Sig Dispense Refill  . aspirin 325 MG tablet Take 325 mg by mouth daily.        . ciprofloxacin (CIPRO) 500 MG tablet Take 1 tablet (500 mg total) by mouth 2 (two) times daily.  20 tablet  0  . fluticasone (FLONASE) 50 MCG/ACT nasal spray       . lisinopril (PRINIVIL,ZESTRIL) 5 MG tablet Take 1 tablet (5 mg total) by mouth daily.  90 tablet  3  . metoprolol succinate (TOPROL-XL) 25 MG 24 hr tablet TAKE 1 TABLET EVERY DAY  30 tablet  11  . niacin (NIASPAN) 1000 MG CR tablet Take 1,000 mg by mouth daily.        . nitroGLYCERIN (NITROSTAT) 0.4 MG SL tablet Place 0.4 mg under the tongue every 5 (five) minutes as needed for chest pain.      Marland Kitchen omeprazole (PRILOSEC) 20 MG capsule Take 20 mg by mouth daily.        . predniSONE (DELTASONE) 20 MG tablet Take 1 tablet (20 mg total) by mouth daily.  30 tablet  0  . simvastatin (ZOCOR) 40 MG tablet TAKE 1 TABLET (40 MG TOTAL) BY MOUTH AT BEDTIME.  30 tablet  6   No current facility-administered medications on file prior to visit.   Allergies  Allergen Reactions  . Celebrex [Celecoxib] Rash    blisters   History   Social History  . Marital Status: Married    Spouse Name: N/A    Number of Children: N/A  . Years of Education: N/A   Occupational History  . Not on file.   Social History Main Topics  . Smoking status: Current Every Day Smoker  . Smokeless tobacco: Not on file     Comment: greater than 50 pack year history  . Alcohol Use: No  . Drug Use: No  . Sexual Activity: Not on file   Other Topics Concern  . 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       Review of Systems  All other systems reviewed and  are negative.       Objective:   Physical Exam  Vitals reviewed. Constitutional:  He is oriented to person, place, and time. He appears well-developed. No distress.  HENT:  Head: Normocephalic and atraumatic.  Right Ear: External ear normal.  Left Ear: External ear normal.  Nose: Nose normal.  Mouth/Throat: Oropharynx is clear and moist. No oropharyngeal exudate.  Eyes: Conjunctivae and EOM are normal. Pupils are equal, round, and reactive to light. Right eye exhibits no discharge. Left eye exhibits no discharge. No scleral icterus.  Neck: Normal range of motion. Neck supple. No JVD present. No thyromegaly present.  Cardiovascular: Normal rate, regular rhythm, normal heart sounds and intact distal pulses.  Exam reveals no gallop and no friction rub.   No murmur heard. Pulmonary/Chest: Effort normal and breath sounds normal. No stridor. No respiratory distress. He has no wheezes. He has no rales. He exhibits no tenderness.  Abdominal: Soft. Bowel sounds are normal. He exhibits no distension and no mass. There is no tenderness. There is no rebound.  Lymphadenopathy:    He has no cervical adenopathy.  Neurological: He is alert and oriented to person, place, and time. He displays normal reflexes. No cranial nerve deficit. He exhibits normal muscle tone. Coordination normal.  Skin: Skin is warm. No rash noted. He is not diaphoretic. No erythema.        Assessment & Plan:  1. Myalgia and myositis I believe the patient had 3 issues at play. I believe he is suffering from myalgias due to statins. He was suffering from low back pain and neck pain due to degenerative disc disease and arthritis. I believe he had hematuria and abdominal pain secondary to prostatitis and possibly kidney stone I asked him to finish the Cipro as his infection seems to be resolved.  I have asked him to continue to refrain from using the statin medication for one month. In one month I want to recheck his symptoms and if  he is having no further myalgias, I will try the patient on a different statin medication. He has already resumed his blood pressure pills and is tolerating them without problems. Begin to wean the patient off prednisone.  Start 10 mg by mouth daily and recheck in one week we'll continue to wean him down as his body will tolerate.  Recheck a CBC, sedimentation rate and an SPEP to rule out skeletal malignancy as a possible cause of his pain. - Sedimentation Rate - CBC with Differential - COMPLETE METABOLIC PANEL WITH GFR - SPEP & IFE with QIG

## 2013-02-18 LAB — SPEP & IFE WITH QIG
Beta 2: 6.1 % (ref 3.2–6.5)
Gamma Globulin: 19 % — ABNORMAL HIGH (ref 11.1–18.8)
IgA: 481 mg/dL — ABNORMAL HIGH (ref 68–379)
IgG (Immunoglobin G), Serum: 1210 mg/dL (ref 650–1600)
IgM, Serum: 129 mg/dL (ref 41–251)

## 2013-03-11 ENCOUNTER — Encounter: Payer: Self-pay | Admitting: Family Medicine

## 2013-03-11 ENCOUNTER — Ambulatory Visit (INDEPENDENT_AMBULATORY_CARE_PROVIDER_SITE_OTHER): Payer: Medicare Other | Admitting: Family Medicine

## 2013-03-11 VITALS — BP 102/60 | HR 60 | Temp 97.6°F | Resp 18 | Ht 74.0 in | Wt 180.0 lb

## 2013-03-11 DIAGNOSIS — I809 Phlebitis and thrombophlebitis of unspecified site: Secondary | ICD-10-CM

## 2013-03-11 DIAGNOSIS — E785 Hyperlipidemia, unspecified: Secondary | ICD-10-CM

## 2013-03-11 MED ORDER — PRAVASTATIN SODIUM 20 MG PO TABS: 20.0000 mg | ORAL_TABLET | Freq: Every day | ORAL | Status: DC

## 2013-03-11 MED ORDER — SILDENAFIL CITRATE 100 MG PO TABS
50.0000 mg | ORAL_TABLET | Freq: Every day | ORAL | Status: DC | PRN
Start: 2013-03-11 — End: 2014-01-04

## 2013-03-11 NOTE — Progress Notes (Signed)
Subjective:    Patient ID: [name], male    DOB: Feb 07, 1935, 78 y.o.   MRN: [id number]  HPI 01/28/13 Patient reports four-day of feeling increasingly weak. He has no energy. He reports fevers and chills. He reports dizziness with standing. His blood pressure is extremely low at 88/54. His heart rate is suppressed at 60 due to Metroprolol. He reports pain in his neck his shoulders his hips his knees and his ankles. He reports generalized myalgias and flulike symptoms. 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 He states he is pulled over 40 ticks off of his body this summer.  He has been bitten numerous times most recently one week ago.  He denies any rash. He denies any abdominal pain. He denies any nausea vomiting diarrhea or constipation. He denies any cough or shortness of breath. He denies any dysuria. He denies any sore throat, ear pain, sinus pain.  At that time, my plan was: 1. Tick bite I am concerned the patient may be developing a tickborne illness such as Lyme disease a [place] spotted fever. I have recommended he begin doxycycline 100 mg by mouth twice a day for 10 days immediately. I will send laboratory studies as listed below. Believe this is the likely cause of his arthralgias and myalgias and low blood pressure the - CBC with Differential - COMPLETE METABOLIC PANEL WITH GFR - Sedimentation rate - B. burgdorfi antibodies by WB - [place] spotted fvr abs pnl(IgG+IgM) - doxycycline (VIBRA-TABS) 100 MG tablet; Take 1 tablet (100 mg total) by mouth 2 (two) times daily.  Dispense: 20 tablet; Refill: 0  2. Other malaise and fatigue Likely due to a combination of #1 as well as his hypertension. Discontinue the metoprolol and lisinopril temporarily.  Push Gatorade and fluids. Rest and stay out of the heat. Recheck tomorrow. Go to the emergency room if worse.  3. Hypotension, unspecified   See Above.  02/04/13 He is here today for a recheck.  He continues to complain of pain  and stiffness in his neck and shoulders. He is also developed low back pain. He has CVA tenderness on exam. He is also now reporting polyuria and frequency. He continues to deny dysuria.  His lab work from the last encounter is reviewed with the patient.  His labs are significant for an elevated white blood cell count, and elevated sedimentation rate, and negative tickborne titers. He is finished 7 days of doxycycline with no improvement. He denies cough, shortness of breath, nausea, vomiting, diarrhea, constipation, sore throat, otalgia, sinusitis. He denies any rash. He is having no aches or pains in his hips, knees, feet, elbows. It is limited to his shoulders and lower back.  At that time, my plan was: 1. Fever, unspecified I am concerned the patient may have signs of systemic illness from prostatitis. He does have a history of urinary tract infections from enterococcus. We'll check a urinalysis and a urine culture.  Urinalysis shows blood and protein. I will send a urine culture. In the meantime I am going to start the patient on Cipro 500 mg by mouth twice a day for 10 days.  He has a history of 2 enterococcus urinary tract infections and I'm concerned by his CVA tenderness. - CULTURE, URINE COMPREHENSIVE - Urinalysis, Routine w reflex microscopic  2. Back pain Back pain could be related to #1 possibly #3.  Stop Zocor and treat with Cipro.  3. Myalgia Differential diagnosis includes myalgias due  to  systemic illness versus rheumatologic condition such as polymyalgia rheumatica versus myalgias from statins.  I will check a CK level and have the patient temporarily discontinue Zocor. No visits with results within 1 Week(s) from this visit. Latest known visit with results is:  Office Visit on 02/16/2013  Component Date Value Range Status  . Sed Rate 02/16/2013 19* 0 - 16 mm/hr Final  . WBC 02/16/2013 9.5  4.0 - 10.5 K/uL Final  . RBC 02/16/2013 4.15* 4.22 - 5.81 MIL/uL Final  . Hemoglobin  02/16/2013 12.1* 13.0 - 17.0 g/dL Final  . HCT 19/14/7829 36.7* 39.0 - 52.0 % Final  . MCV 02/16/2013 88.4  78.0 - 100.0 fL Final  . MCH 02/16/2013 29.2  26.0 - 34.0 pg Final  . MCHC 02/16/2013 33.0  30.0 - 36.0 g/dL Final  . RDW 56/21/3086 15.6* 11.5 - 15.5 % Final  . Platelets 02/16/2013 324  150 - 400 K/uL Final  . Neutrophils Relative % 02/16/2013 53  43 - 77 % Final  . Neutro Abs 02/16/2013 5.0  1.7 - 7.7 K/uL Final  . Lymphocytes Relative 02/16/2013 34  12 - 46 % Final  . Lymphs Abs 02/16/2013 3.2  0.7 - 4.0 K/uL Final  . Monocytes Relative 02/16/2013 10  3 - 12 % Final  . Monocytes Absolute 02/16/2013 1.0  0.1 - 1.0 K/uL Final  . Eosinophils Relative 02/16/2013 2  0 - 5 % Final  . Eosinophils Absolute 02/16/2013 0.2  0.0 - 0.7 K/uL Final  . Basophils Relative 02/16/2013 1  0 - 1 % Final  . Basophils Absolute 02/16/2013 0.1  0.0 - 0.1 K/uL Final  . Smear Review 02/16/2013 Criteria for review not met   Final  . Sodium 02/16/2013 144  135 - 145 mEq/L Final  . Potassium 02/16/2013 4.9  3.5 - 5.3 mEq/L Final  . Chloride 02/16/2013 109  96 - 112 mEq/L Final  . CO2 02/16/2013 26  19 - 32 mEq/L Final  . Glucose, Bld 02/16/2013 89  70 - 99 mg/dL Final  . BUN 57/84/6962 20  6 - 23 mg/dL Final  . Creat 95/28/4132 1.35  0.50 - 1.35 mg/dL Final  . Total Bilirubin 02/16/2013 0.3  0.3 - 1.2 mg/dL Final  . Alkaline Phosphatase 02/16/2013 61  39 - 117 U/L Final  . AST 02/16/2013 15  0 - 37 U/L Final  . ALT 02/16/2013 12  0 - 53 U/L Final  . Total Protein 02/16/2013 6.7  6.0 - 8.3 g/dL Final  . Albumin 44/06/270 3.7  3.5 - 5.2 g/dL Final  . Calcium 53/66/4403 9.1  8.4 - 10.5 mg/dL Final  . GFR, Est African American 02/16/2013 59*  Final  . GFR, Est Non African American 02/16/2013 51*  Final   Comment:                            The estimated GFR is a calculation valid for adults (>=57 years old)                          that uses the CKD-EPI algorithm to adjust for age and sex. It is                             not to be used for children, pregnant women, hospitalized patients,  patients on dialysis, or with rapidly changing kidney function.                          According to the NKDEP, eGFR >89 is normal, 60-89 shows mild                          impairment, 30-59 shows moderate impairment, 15-29 shows severe                          impairment and <15 is ESRD.                             . IgG (Immunoglobin G), Serum 02/16/2013 1210  650 - 1600 mg/dL Final  . IgA 40/98/1191 481* 68 - 379 mg/dL Final  . IgM, Serum 47/82/9562 129  41 - 251 mg/dL Final  . Immunofix Electr Int 02/16/2013    Final   Comment: No monoclonal protein identified.                          Reviewed by Dallas Breeding, MD, PhD, FCAP (Electronic Signature on                          File)  . Total Protein, Serum Electrophores* 02/16/2013 6.7  6.0 - 8.3 g/dL Final  . Albumin ELP 13/01/6577 51.4* 55.8 - 66.1 % Final  . Alpha-1-Globulin 02/16/2013 5.0* 2.9 - 4.9 % Final  . Alpha-2-Globulin 02/16/2013 12.5* 7.1 - 11.8 % Final  . Beta Globulin 02/16/2013 6.0  4.7 - 7.2 % Final  . Beta 2 02/16/2013 6.1  3.2 - 6.5 % Final  . Gamma Globulin 02/16/2013 19.0* 11.1 - 18.8 % Final  . M-Spike, % 02/16/2013 NOT DET   Final  . SPE Interp. 02/16/2013    Final   Comment: Nonspecific diffuse polyclonal type increase in gamma globulins.                          Reviewed by Dallas Breeding, MD, PhD, FCAP (Electronic Signature on                          File)  . COMMENT (PROTEIN ELECTROPHOR) 02/16/2013    Final   Comment: ---------------                          Serum protein electrophoresis is a useful screening procedure in the                          detection of various pathophysiologic states such as inflammation,                          gammopathies, protein loss and other dysproteinemias.  Immunofixation                          electrophoresis (IFE) is a more sensitive  technique for the                          identification of M-proteins  found in patients with monoclonal                          gammopathy of unknown significance (MGUS), amyloidosis, early or                          treated myeloma or macroglobulinemia, solitary plasmacytoma or                          extramedullary plasmacytoma.      02/09/13 Patient states he is no better. He continues to have severe pain in his left lower back. He is also having left lower quadrant abdominal pain. He denies dysuria. The polyuria has improved. The frequency has improved. He denies any gross hematuria. He denies any constipation. He denies any hematochezia. He denies any melena. He continues to have subjective fever and episodes of intense sweating. He is also having pain and stiffness in his neck along with his lower back.  At that time, my plan was: 1. Hematuria - CT Abdomen Pelvis W Contrast; Future  2. Abdominal pain, left lower quadrant I am concerned that the patient has some smoldering source of information that is causing symptoms consistent with peritonitis. I recommended a CT of the abdomen and pelvis with contrast as soon as possible to delineate the source whether it be diverticulitis, pyelonephritis, etc. In the meantime I requested that he continue the Cipro. I also recommended he continue to refrain from using his blood pressure medication if his blood pressure is still slightly low. - CT Abdomen Pelvis W Contrast; Future  3. Myalgia The patient's myalgia is better. I recommended he continue to refrain from using simvastatin. He does not have symptoms consistent with polymyalgia rheumatica the present time. I believe the majority of his aches and pains is related to some source of systemic illness that is causing his abdominal pain.  02/16/13 CT scan results are listed below; 1. Nonobstructing left renal calculi are noted but no  hydronephrosis is seen. No ureteral calculus is evident.   2. No abnormality of the urinary bladder is seen.  3. Rectosigmoid and distal descending colon diverticula. No  diverticulitis.  4. The appendix and terminal ileum are unremarkable.  Since taking the Cipro, the patient's abdominal pain has improved. Furthermore his nocturia has improved and essentially stopped. He denies any further hematuria. Since starting the prednisone and discontinuing the statin his low back pain is 75% better. Is not 100% better hence I therefore do not feel that this is PMR. It is consistent with someone having low back pain related to degenerative disc disease and arthritis coupled with myalgias due to statin.  In the respective patient feels much better and is having no further fevers.  At that time, my plan was: 1. Myalgia and myositis I believe the patient had 3 issues at play. I believe he is suffering from myalgias due to statins. He was suffering from low back pain and neck pain due to degenerative disc disease and arthritis. I believe he had hematuria and abdominal pain secondary to prostatitis and possibly kidney stone I asked him to finish the Cipro as his infection seems to be resolved.  I have asked him to continue to refrain from using the statin medication for one month. In one month I want to recheck his symptoms and if he is having no further myalgias, I will try the  patient on a different statin medication. He has already resumed his blood pressure pills and is tolerating them without problems. Begin to wean the patient off prednisone.  Start 10 mg by mouth daily and recheck in one week we'll continue to wean him down as his body will tolerate.  Recheck a CBC, sedimentation rate and an SPEP to rule out skeletal malignancy as a possible cause of his pain. - Sedimentation Rate - CBC with Differential - COMPLETE METABOLIC PANEL WITH GFR - SPEP & IFE with QIG  03/11/13 Patient is here today for followup. He is doing much better. He is essentially pain-free. He  denies any fevers or chills. He denies any nocturia, dysuria, frequency or urgency. He does have some chronic low back pain that is persistent due to degenerative disc disease but other that he is back to his baseline. He continues to refrain from taking simvastatin due to myalgias that it caused. He is not taking any pain medication. He is not taking any prednisone.  He also complains of pain in his medial upper left thigh. He has large varicosities all throughout his legs including his upper medial left thigh. The varicosity in his upper medial left thigh is tender to palpation. There is no erythema warmth. The complexes approximately 7 cm x 3 cm and consists of a large tortuous varicosity.   Past Medical History  Diagnosis Date  . Atrial flutter     and SVT that is post ablation  . Acute venous embolism and thrombosis of unspecified deep vessels of lower extremity   . Dyslipidemia   . CAD (coronary artery disease)   . HTN (hypertension)    Current Outpatient Prescriptions on File Prior to Visit  Medication Sig Dispense Refill  . aspirin 325 MG tablet Take 325 mg by mouth daily.        . ciprofloxacin (CIPRO) 500 MG tablet Take 1 tablet (500 mg total) by mouth 2 (two) times daily.  20 tablet  0  . fluticasone (FLONASE) 50 MCG/ACT nasal spray       . lisinopril (PRINIVIL,ZESTRIL) 5 MG tablet Take 1 tablet (5 mg total) by mouth daily.  90 tablet  3  . metoprolol succinate (TOPROL-XL) 25 MG 24 hr tablet TAKE 1 TABLET EVERY DAY  30 tablet  11  . niacin (NIASPAN) 1000 MG CR tablet Take 1,000 mg by mouth daily.        . nitroGLYCERIN (NITROSTAT) 0.4 MG SL tablet Place 0.4 mg under the tongue every 5 (five) minutes as needed for chest pain.      Marland Kitchen omeprazole (PRILOSEC) 20 MG capsule Take 20 mg by mouth daily.        . predniSONE (DELTASONE) 10 MG tablet Take 1 tablet (10 mg total) by mouth daily. Call me before  You run out to decide next dose.  7 tablet  0  . predniSONE (DELTASONE) 20 MG tablet  Take 1 tablet (20 mg total) by mouth daily.  30 tablet  0  . simvastatin (ZOCOR) 40 MG tablet TAKE 1 TABLET (40 MG TOTAL) BY MOUTH AT BEDTIME.  30 tablet  6   No current facility-administered medications on file prior to visit.   Allergies  Allergen Reactions  . Celebrex [Celecoxib] Rash    blisters   History   Social History  . Marital Status: Married    Spouse Name: N/A    Number of Children: N/A  . Years of Education: N/A   Occupational History  .  Not on file.   Social History Main Topics  . Smoking status: Current Every Day Smoker  . Smokeless tobacco: Not on file     Comment: greater than 50 pack year history  . Alcohol Use: No  . Drug Use: No  . Sexual Activity: Not on file   Other Topics Concern  . 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       Review of Systems  All other systems reviewed and are negative.       Objective:   Physical Exam  Vitals reviewed. Constitutional: He is oriented to person, place, and time. He appears well-developed. No distress.  HENT:  Head: Normocephalic and atraumatic.  Right Ear: External ear normal.  Left Ear: External ear normal.  Nose: Nose normal.  Mouth/Throat: Oropharynx is clear and moist. No oropharyngeal exudate.  Eyes: Conjunctivae and EOM are normal. Pupils are equal, round, and reactive to light. Right eye exhibits no discharge. Left eye exhibits no discharge. No scleral icterus.  Neck: Normal range of motion. Neck supple. No JVD present. No thyromegaly present.  Cardiovascular: Normal rate, regular rhythm, normal heart sounds and intact distal pulses.  Exam reveals no gallop and no friction rub.   No murmur heard. Pulmonary/Chest: Effort normal and breath sounds normal. No stridor. No respiratory distress. He has no wheezes. He has no rales. He exhibits no tenderness.   Abdominal: Soft. Bowel sounds are normal. He exhibits no distension and no mass. There is no tenderness. There is no rebound.  Lymphadenopathy:    He has no cervical adenopathy.  Neurological: He is alert and oriented to person, place, and time. He displays normal reflexes. No cranial nerve deficit. He exhibits normal muscle tone. Coordination normal.  Skin: Skin is warm. No rash noted. He is not diaphoretic. No erythema.   See description and history of present illness. Patient has a large tortuous chronic varicosity in his upper medial left thigh is mildly tender to palpation. There is no pitting edema in his legs. There is no erythema in his legs. He has a negative Homans sign     Assessment & Plan:   1. HLD (hyperlipidemia) Patient's myalgias have completely subsided. Given his history of cardiovascular disease, I started the patient on pravastatin 20 mg by mouth daily. I warned the patient to monitor himself for myalgias. Otherwise I would recheck a CMP and fasting lipid panel in 3 months.  I believe his low back pain is due to degenerative disease and arthritis. His prostatitis has completely resolved. - pravastatin (PRAVACHOL) 20 MG tablet; Take 1 tablet (20 mg total) by mouth daily.  Dispense: 30 tablet; Refill: 4  2. Superficial thrombophlebitis At worst, I feel the patient may have a mild case of superficial thrombophlebitis. I recommended aspirin 325 mg by mouth daily and warm compresses be applied to varicosities. His past medical history significant for requiring venous ablation several times in his legs due to tender swollen varicose veins. We could consider this again if this tender varicosity does not improve.  I see no evidence on exam for a DVT.

## 2013-03-24 ENCOUNTER — Other Ambulatory Visit: Payer: Self-pay | Admitting: Family Medicine

## 2013-05-26 ENCOUNTER — Ambulatory Visit (HOSPITAL_COMMUNITY): Payer: Medicare Other | Attending: Cardiology

## 2013-05-26 DIAGNOSIS — I6529 Occlusion and stenosis of unspecified carotid artery: Secondary | ICD-10-CM

## 2013-05-28 ENCOUNTER — Other Ambulatory Visit: Payer: Self-pay | Admitting: *Deleted

## 2013-05-28 DIAGNOSIS — I679 Cerebrovascular disease, unspecified: Secondary | ICD-10-CM

## 2013-08-12 ENCOUNTER — Other Ambulatory Visit: Payer: Self-pay | Admitting: Family Medicine

## 2013-08-12 NOTE — Telephone Encounter (Signed)
Refill appropriate and filled per protocol. 

## 2013-09-17 ENCOUNTER — Ambulatory Visit (INDEPENDENT_AMBULATORY_CARE_PROVIDER_SITE_OTHER): Payer: Commercial Managed Care - HMO | Admitting: Family Medicine

## 2013-09-17 ENCOUNTER — Encounter: Payer: Self-pay | Admitting: Family Medicine

## 2013-09-17 VITALS — BP 130/60 | HR 74 | Temp 96.7°F | Resp 18 | Ht 74.0 in | Wt 195.0 lb

## 2013-09-17 DIAGNOSIS — I8310 Varicose veins of unspecified lower extremity with inflammation: Secondary | ICD-10-CM

## 2013-09-17 DIAGNOSIS — I872 Venous insufficiency (chronic) (peripheral): Secondary | ICD-10-CM

## 2013-09-17 DIAGNOSIS — N419 Inflammatory disease of prostate, unspecified: Secondary | ICD-10-CM

## 2013-09-17 DIAGNOSIS — R350 Frequency of micturition: Secondary | ICD-10-CM

## 2013-09-17 LAB — URINALYSIS, MICROSCOPIC ONLY
Casts: NONE SEEN
Crystals: NONE SEEN

## 2013-09-17 LAB — URINALYSIS, ROUTINE W REFLEX MICROSCOPIC
BILIRUBIN URINE: NEGATIVE
Glucose, UA: NEGATIVE mg/dL
KETONES UR: NEGATIVE mg/dL
Leukocytes, UA: NEGATIVE
Nitrite: NEGATIVE
PROTEIN: 30 mg/dL — AB
Specific Gravity, Urine: 1.02 (ref 1.005–1.030)
UROBILINOGEN UA: 1 mg/dL (ref 0.0–1.0)
pH: 5.5 (ref 5.0–8.0)

## 2013-09-17 MED ORDER — CIPROFLOXACIN HCL 500 MG PO TABS: 500.0000 mg | ORAL_TABLET | Freq: Two times a day (BID) | ORAL | Status: DC

## 2013-09-17 MED ORDER — MOMETASONE FUROATE 0.1 % EX CREA: 1.0000 "application " | TOPICAL_CREAM | Freq: Every day | CUTANEOUS | Status: DC

## 2013-09-17 NOTE — Progress Notes (Signed)
Subjective:    Patient ID: [name], male    DOB: 07-06-1934, 79 y.o.   MRN: [id number]  HPI Paient presents with 3 weeks of a rash on both lower legs distal to the calf.  It is a red scaly papulosquamous rash similar to nummular eczema.  He does have a history of chronic venous insufficiency and has numerous varicose veins in his legs. This also causes swelling on a daily basis in his legs. The rash is extremely itchy and he finds himself scratching sores on his legs. He also reports 3 weeks of increasing polyuria and nocturia he also reports dysuria and increasing left-sided low back pain. He has a history of urinary tract infections and prostatitis and these are similar symptoms to what he has experienced previously. His urinalysis today shows only trace blood. Past Medical History  Diagnosis Date  . Atrial flutter     and SVT that is post ablation  . Acute venous embolism and thrombosis of unspecified deep vessels of lower extremity   . Dyslipidemia   . CAD (coronary artery disease)   . HTN (hypertension)    Current Outpatient Prescriptions on File Prior to Visit  Medication Sig Dispense Refill  . aspirin 325 MG tablet Take 325 mg by mouth daily.        . fluticasone (FLONASE) 50 MCG/ACT nasal spray       . lisinopril (PRINIVIL,ZESTRIL) 5 MG tablet Take 1 tablet (5 mg total) by mouth daily.  90 tablet  3  . metoprolol succinate (TOPROL-XL) 25 MG 24 hr tablet TAKE 1 TABLET EVERY DAY  30 tablet  11  . niacin (NIASPAN) 1000 MG CR tablet Take 1,000 mg by mouth daily.        . nitroGLYCERIN (NITROSTAT) 0.4 MG SL tablet Place 0.4 mg under the tongue every 5 (five) minutes as needed for chest pain.      Marland Kitchen omeprazole (PRILOSEC) 20 MG capsule TAKE ONE CAPSULE BY MOUTH EVERY MORNING  30 capsule  11  . pravastatin (PRAVACHOL) 20 MG tablet TAKE 1 TABLET (20 MG TOTAL) BY MOUTH DAILY.  30 tablet  4  . sildenafil (VIAGRA) 100 MG tablet Take 0.5-1 tablets (50-100 mg total) by mouth daily as  needed for erectile dysfunction.  5 tablet  11  . simvastatin (ZOCOR) 40 MG tablet TAKE 1 TABLET (40 MG TOTAL) BY MOUTH AT BEDTIME.  30 tablet  6   No current facility-administered medications on file prior to visit.   Allergies  Allergen Reactions  . Celebrex [Celecoxib] Rash    blisters   History   Social History  . Marital Status: Married    Spouse Name: N/A    Number of Children: N/A  . Years of Education: N/A   Occupational History  . Not on file.   Social History Main Topics  . Smoking status: Current Every Day Smoker  . Smokeless tobacco: Not on file     Comment: greater than 50 pack year history  . Alcohol Use: No  . Drug Use: No  . Sexual Activity: Not on file   Other Topics Concern  . 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       Review of Systems  All other systems reviewed and are negative.       Objective:   Physical  Exam  Vitals reviewed. Cardiovascular: Normal rate, regular rhythm and normal heart sounds.   Pulmonary/Chest: Effort normal and breath sounds normal.  Abdominal: Soft. Bowel sounds are normal.  Skin: Rash noted. There is erythema.   patient has left-sided CVA tenderness.        Assessment & Plan:  1. Frequent urination - Urinalysis, Routine w reflex microscopic - Urine culture  2. Venous stasis dermatitis Begin Elocon 0.1% cream applied daily for 7-10 days. - mometasone (ELOCON) 0.1 % cream; Apply 1 application topically daily.  Dispense: 45 g; Refill: 2  3. Prostatitis The patient is developing prostatitis. I recommended Cipro 500 mg by mouth twice a day for 10 days. Symptoms persist consider starting the patient on flomax. - ciprofloxacin (CIPRO) 500 MG tablet; Take 1 tablet (500 mg total) by mouth 2 (two) times daily.  Dispense: 20 tablet; Refill: 0

## 2013-09-18 LAB — URINE CULTURE
COLONY COUNT: NO GROWTH
Organism ID, Bacteria: NO GROWTH

## 2013-10-22 ENCOUNTER — Encounter: Payer: Self-pay | Admitting: Family Medicine

## 2013-10-22 ENCOUNTER — Ambulatory Visit (INDEPENDENT_AMBULATORY_CARE_PROVIDER_SITE_OTHER): Payer: Medicare HMO | Admitting: Family Medicine

## 2013-10-22 VITALS — BP 120/68 | HR 72 | Temp 97.0°F | Resp 20 | Ht 74.0 in | Wt 190.0 lb

## 2013-10-22 DIAGNOSIS — N419 Inflammatory disease of prostate, unspecified: Secondary | ICD-10-CM

## 2013-10-22 DIAGNOSIS — R300 Dysuria: Secondary | ICD-10-CM

## 2013-10-22 DIAGNOSIS — M549 Dorsalgia, unspecified: Secondary | ICD-10-CM

## 2013-10-22 LAB — URINALYSIS, MICROSCOPIC ONLY
Casts: NONE SEEN
Crystals: NONE SEEN

## 2013-10-22 LAB — URINALYSIS, ROUTINE W REFLEX MICROSCOPIC
Bilirubin Urine: NEGATIVE
Glucose, UA: NEGATIVE mg/dL
Ketones, ur: NEGATIVE mg/dL
Leukocytes, UA: NEGATIVE
Nitrite: NEGATIVE
PROTEIN: NEGATIVE mg/dL
Specific Gravity, Urine: 1.015 (ref 1.005–1.030)
UROBILINOGEN UA: 0.2 mg/dL (ref 0.0–1.0)
pH: 6 (ref 5.0–8.0)

## 2013-10-22 MED ORDER — SULFAMETHOXAZOLE-TMP DS 800-160 MG PO TABS: 1.0000 | ORAL_TABLET | Freq: Two times a day (BID) | ORAL | Status: DC

## 2013-10-22 MED ORDER — TAMSULOSIN HCL 0.4 MG PO CAPS: 0.4000 mg | ORAL_CAPSULE | Freq: Every day | ORAL | Status: DC

## 2013-10-22 NOTE — Progress Notes (Signed)
Subjective:    Patient ID: [name], male    DOB: 07/12/1934, 79 y.o.   MRN: [id number]  HPI  09/17/13 He also reports 3 weeks of increasing polyuria and nocturia he also reports dysuria and increasing left-sided low back pain. He has a history of urinary tract infections and prostatitis and these are similar symptoms to what he has experienced previously. His urinalysis today shows only trace blood.  At that time, my plan was: Prostatitis The patient is developing prostatitis. I recommended Cipro 500 mg by mouth twice a day for 10 days. Symptoms persist consider starting the patient on flomax. - ciprofloxacin (CIPRO) 500 MG tablet; Take 1 tablet (500 mg total) by mouth 2 (two) times daily.  Dispense: 20 tablet; Refill: 0  10/22/13 Patient returns today stating that the symptoms never got better. He continues to have dysuria every time he voids. He also complains of persistent left lower back pain. He continues to complain of frequency of urination. He continues to complain of 2 episodes of nocturia per night. Urinalysis today shows persistent hematuria which is microscopic. The patient had a CT of abdomen and pelvis in August of 2014 showed a 5 mm left lower pole nephrolithiasis but was otherwise normal. He has not seen a urologist. Results of CT 8/14 1. Nonobstructing left renal calculi are noted but no  hydronephrosis is seen. No ureteral calculus is evident.  2. No abnormality of the urinary bladder is seen.  3. Rectosigmoid and distal descending colon diverticula. No  diverticulitis.  4. The appendix and terminal ileum are unremarkable.  Past Medical History  Diagnosis Date  . Atrial flutter     and SVT that is post ablation  . Acute venous embolism and thrombosis of unspecified deep vessels of lower extremity   . Dyslipidemia   . CAD (coronary artery disease)   . HTN (hypertension)    Current Outpatient Prescriptions on File Prior to Visit  Medication Sig Dispense Refill    . aspirin 325 MG tablet Take 325 mg by mouth daily.        . fluticasone (FLONASE) 50 MCG/ACT nasal spray       . lisinopril (PRINIVIL,ZESTRIL) 5 MG tablet Take 1 tablet (5 mg total) by mouth daily.  90 tablet  3  . metoprolol succinate (TOPROL-XL) 25 MG 24 hr tablet TAKE 1 TABLET EVERY DAY  30 tablet  11  . mometasone (ELOCON) 0.1 % cream Apply 1 application topically daily.  45 g  2  . niacin (NIASPAN) 1000 MG CR tablet Take 1,000 mg by mouth daily.        . nitroGLYCERIN (NITROSTAT) 0.4 MG SL tablet Place 0.4 mg under the tongue every 5 (five) minutes as needed for chest pain.      Marland Kitchen omeprazole (PRILOSEC) 20 MG capsule TAKE ONE CAPSULE BY MOUTH EVERY MORNING  30 capsule  11  . pravastatin (PRAVACHOL) 20 MG tablet TAKE 1 TABLET (20 MG TOTAL) BY MOUTH DAILY.  30 tablet  4  . sildenafil (VIAGRA) 100 MG tablet Take 0.5-1 tablets (50-100 mg total) by mouth daily as needed for erectile dysfunction.  5 tablet  11  . simvastatin (ZOCOR) 40 MG tablet TAKE 1 TABLET (40 MG TOTAL) BY MOUTH AT BEDTIME.  30 tablet  6   No current facility-administered medications on file prior to visit.   Allergies  Allergen Reactions  . Celebrex [Celecoxib] Rash    blisters   History   Social History  .  Marital Status: Married    Spouse Name: N/A    Number of Children: N/A  . Years of Education: N/A   Occupational History  . Not on file.   Social History Main Topics  . Smoking status: Current Every Day Smoker  . Smokeless tobacco: Not on file     Comment: greater than 50 pack year history  . Alcohol Use: No  . Drug Use: No  . Sexual Activity: Not on file   Other Topics Concern  . 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       Review of Systems  All other systems reviewed and are negative.      Objective:   Physical Exam  Vitals reviewed. Cardiovascular:  Normal rate, regular rhythm and normal heart sounds.   Pulmonary/Chest: Effort normal and breath sounds normal.  Abdominal: Soft. Bowel sounds are normal.  Skin: Rash noted. There is erythema.   patient has left-sided CVA tenderness.  Prostate exam today is benign. The prostate is not remarkably swollen. It is mildly tender to palpation.      Assessment & Plan:  Back pain - Plan: Urinalysis, Routine w reflex microscopic, Urine culture  Dysuria - Plan: Urinalysis, Routine w reflex microscopic, sulfamethoxazole-trimethoprim (BACTRIM DS) 800-160 MG per tablet, Urine culture, tamsulosin (FLOMAX) 0.4 MG CAPS capsule  Prostatitis - Plan: tamsulosin (FLOMAX) 0.4 MG CAPS capsule, Ambulatory referral to Urology  I still believe the patient has prostatitis as a cause of his dysuria, frequency, nocturia. Previous urine culture has been negative. I will switch the patient to Bactrim double strength tablets 1 by mouth twice a day for 2 weeks. Also add Flomax 0.4 mg by mouth each bedtime. Because the patient continues to have these symptoms and persistent hematuria, I believe the patient would benefit from a urology consultation for cystoscopy

## 2013-10-24 LAB — URINE CULTURE
Colony Count: NO GROWTH
ORGANISM ID, BACTERIA: NO GROWTH

## 2013-10-25 ENCOUNTER — Ambulatory Visit (INDEPENDENT_AMBULATORY_CARE_PROVIDER_SITE_OTHER): Payer: Medicare HMO | Admitting: Physician Assistant

## 2013-10-25 ENCOUNTER — Encounter: Payer: Self-pay | Admitting: Physician Assistant

## 2013-10-25 VITALS — BP 130/70 | HR 78 | Temp 97.8°F | Resp 17 | Ht 74.0 in | Wt 190.0 lb

## 2013-10-25 DIAGNOSIS — M79676 Pain in unspecified toe(s): Secondary | ICD-10-CM

## 2013-10-25 DIAGNOSIS — M79609 Pain in unspecified limb: Secondary | ICD-10-CM

## 2013-10-25 DIAGNOSIS — M109 Gout, unspecified: Secondary | ICD-10-CM

## 2013-10-25 LAB — CBC WITH DIFFERENTIAL/PLATELET
BASOS ABS: 0.1 10*3/uL (ref 0.0–0.1)
Basophils Relative: 1 % (ref 0–1)
Eosinophils Absolute: 0.3 10*3/uL (ref 0.0–0.7)
Eosinophils Relative: 3 % (ref 0–5)
HCT: 39.6 % (ref 39.0–52.0)
Hemoglobin: 13.4 g/dL (ref 13.0–17.0)
Lymphocytes Relative: 25 % (ref 12–46)
Lymphs Abs: 2.3 10*3/uL (ref 0.7–4.0)
MCH: 29.3 pg (ref 26.0–34.0)
MCHC: 33.8 g/dL (ref 30.0–36.0)
MCV: 86.7 fL (ref 78.0–100.0)
Monocytes Absolute: 1.3 10*3/uL — ABNORMAL HIGH (ref 0.1–1.0)
Monocytes Relative: 14 % — ABNORMAL HIGH (ref 3–12)
NEUTROS ABS: 5.2 10*3/uL (ref 1.7–7.7)
NEUTROS PCT: 57 % (ref 43–77)
PLATELETS: 196 10*3/uL (ref 150–400)
RBC: 4.57 MIL/uL (ref 4.22–5.81)
RDW: 14.9 % (ref 11.5–15.5)
WBC: 9.2 10*3/uL (ref 4.0–10.5)

## 2013-10-25 LAB — BASIC METABOLIC PANEL WITH GFR
BUN: 15 mg/dL (ref 6–23)
CHLORIDE: 103 meq/L (ref 96–112)
CO2: 23 mEq/L (ref 19–32)
Calcium: 9.4 mg/dL (ref 8.4–10.5)
Creat: 1.61 mg/dL — ABNORMAL HIGH (ref 0.50–1.35)
GFR, EST NON AFRICAN AMERICAN: 41 mL/min — AB
GFR, Est African American: 47 mL/min — ABNORMAL LOW
Glucose, Bld: 166 mg/dL — ABNORMAL HIGH (ref 70–99)
POTASSIUM: 5 meq/L (ref 3.5–5.3)
SODIUM: 139 meq/L (ref 135–145)

## 2013-10-25 LAB — URIC ACID: Uric Acid, Serum: 7.4 mg/dL (ref 4.0–7.8)

## 2013-10-25 MED ORDER — COLCHICINE 0.6 MG PO TABS: ORAL_TABLET | ORAL | Status: DC

## 2013-10-25 NOTE — Progress Notes (Signed)
Patient ID: [name] MRN: [id number], DOB: 04-27-1935, 79 y.o. Date of Encounter: 10/25/2013, 9:31 AM    Chief Complaint:  Chief Complaint  Patient presents with  . Gout    rt great toe red and painful     HPI: 79 y.o. year old white male reports that he is having pain at the MTP joint of his right first toe. Says that it started yesterday and says he could not get any sleep last night secondary to the pain. States that he's had no trauma or injury to that area. Says he has not accidentally banged his toe on any furniture/ object. Has not dropped anything on his toe. Also says he has not done a lot of walking or unusual activity to have caused the pain.  States that he has had gout in the past in both of his feet. Says that the last episode was greater than one year ago. Says this pain feels the same as his prior gout. Says the area is extremely tender-- to the even light touch.      Home Meds: See attached medication section for any medications that were entered at today's visit. The computer does not put those onto this list.The following list is a list of meds entered prior to today's visit.   Current Outpatient Prescriptions on File Prior to Visit  Medication Sig Dispense Refill  . aspirin 325 MG tablet Take 325 mg by mouth daily.        . fluticasone (FLONASE) 50 MCG/ACT nasal spray       . lisinopril (PRINIVIL,ZESTRIL) 5 MG tablet Take 1 tablet (5 mg total) by mouth daily.  90 tablet  3  . metoprolol succinate (TOPROL-XL) 25 MG 24 hr tablet TAKE 1 TABLET EVERY DAY  30 tablet  11  . mometasone (ELOCON) 0.1 % cream Apply 1 application topically daily.  45 g  2  . niacin (NIASPAN) 1000 MG CR tablet Take 1,000 mg by mouth daily.        . nitroGLYCERIN (NITROSTAT) 0.4 MG SL tablet Place 0.4 mg under the tongue every 5 (five) minutes as needed for chest pain.      Marland Kitchen omeprazole (PRILOSEC) 20 MG capsule TAKE ONE CAPSULE BY MOUTH EVERY MORNING  30 capsule  11  . pravastatin  (PRAVACHOL) 20 MG tablet TAKE 1 TABLET (20 MG TOTAL) BY MOUTH DAILY.  30 tablet  4  . sildenafil (VIAGRA) 100 MG tablet Take 0.5-1 tablets (50-100 mg total) by mouth daily as needed for erectile dysfunction.  5 tablet  11  . simvastatin (ZOCOR) 40 MG tablet TAKE 1 TABLET (40 MG TOTAL) BY MOUTH AT BEDTIME.  30 tablet  6  . sulfamethoxazole-trimethoprim (BACTRIM DS) 800-160 MG per tablet Take 1 tablet by mouth 2 (two) times daily.  30 tablet  0  . tamsulosin (FLOMAX) 0.4 MG CAPS capsule Take 1 capsule (0.4 mg total) by mouth daily.  30 capsule  3   No current facility-administered medications on file prior to visit.    Allergies:  Allergies  Allergen Reactions  . Celebrex [Celecoxib] Rash    blisters      Review of Systems: See HPI for pertinent ROS. All other ROS negative.    Physical Exam: Blood pressure 130/70, pulse 78, temperature 97.8 F (36.6 C), temperature source Oral, resp. rate 17, height 6\' 2"  (1.88 m), weight 190 lb (86.183 kg)., Body mass index is 24.38 kg/(m^2). General:  WNWD WM. Appears in no acute  distress. Neck: Supple. No thyromegaly. No lymphadenopathy. Lungs: Clear bilaterally to auscultation without wheezes, rales, or rhonchi. Breathing is unlabored. Heart: Regular rhythm. No murmurs, rubs, or gallops. Msk:  Strength and tone normal for age. Extremities/Skin: Warm and dry. The right first toe MTP joint there is minimal swelling. Very light pink erythema-- almost no erythema present. Even with very light touch there is significant tenderness. Neuro: Alert and oriented X 3. Moves all extremities spontaneously. Gait is normal. CNII-XII grossly in tact. Psych:  Responds to questions appropriately with a normal affect.     ASSESSMENT AND PLAN:  79 y.o. year old male with  1. Gout - CBC with Differential - BASIC METABOLIC PANEL WITH GFR - Uric acid - colchicine 0.6 MG tablet; 2 at sign of gout flare then 1 one hour after initial administration. Then 1 twice  daily until pain resolves.  Dispense: 60 tablet; Refill: 0  2. Toe pain - CBC with Differential - BASIC METABOLIC PANEL WITH GFR - Uric acid - colchicine 0.6 MG tablet; 2 at sign of gout flare then 1 one hour after initial administration. Then 1 twice daily until pain resolves.  Dispense: 60 tablet; Refill: 0   Check CBC to check white count to make sure this is normal and no signs of infection. We'll check a uric acid level. Check his kidney function. Last renal function I can find in the computer is 02/16/13. At that time his creatinine was normal at 1.35. GFR was slightly reduced at 51. If his renal function is any worse than this then we will need to reduce his dosing of his Colcrys down to once daily.  If his pain, redness, swelling of the toe worsens at all, then  follow up immediately. If it does not resolve over the next 4 days, then followup.  Signed, [address] [place], [place], [facility] 10/25/2013 9:31 AM

## 2013-11-05 ENCOUNTER — Telehealth: Payer: Self-pay | Admitting: *Deleted

## 2013-11-05 NOTE — Telephone Encounter (Signed)
Faxed referral to [facility] care mgmt on May 4 and then again today, pending auth.

## 2013-11-09 NOTE — Telephone Encounter (Signed)
Faxed [facility] for status on referral, pending authorization.

## 2013-11-10 ENCOUNTER — Other Ambulatory Visit: Payer: Self-pay

## 2013-11-10 DIAGNOSIS — I10 Essential (primary) hypertension: Secondary | ICD-10-CM

## 2013-11-10 DIAGNOSIS — I2510 Atherosclerotic heart disease of native coronary artery without angina pectoris: Secondary | ICD-10-CM

## 2013-11-10 MED ORDER — LISINOPRIL 5 MG PO TABS: 5.0000 mg | ORAL_TABLET | Freq: Every day | ORAL | Status: DC

## 2013-11-10 NOTE — Telephone Encounter (Signed)
Received fax back from [facility] care mgmt stating that pt is not [employer] member therefore request is not found .

## 2013-11-26 ENCOUNTER — Ambulatory Visit (HOSPITAL_COMMUNITY): Payer: Medicare HMO | Attending: Cardiology | Admitting: Cardiology

## 2013-11-26 DIAGNOSIS — I6529 Occlusion and stenosis of unspecified carotid artery: Secondary | ICD-10-CM

## 2013-11-26 DIAGNOSIS — I679 Cerebrovascular disease, unspecified: Secondary | ICD-10-CM | POA: Insufficient documentation

## 2013-11-26 NOTE — Progress Notes (Signed)
Carotid duplex performed 

## 2013-12-06 ENCOUNTER — Ambulatory Visit (INDEPENDENT_AMBULATORY_CARE_PROVIDER_SITE_OTHER): Payer: Commercial Managed Care - HMO | Admitting: Physician Assistant

## 2013-12-06 ENCOUNTER — Encounter: Payer: Self-pay | Admitting: Physician Assistant

## 2013-12-06 ENCOUNTER — Encounter (HOSPITAL_COMMUNITY)
Admission: RE | Disposition: A | Discharge status: Home / Self Care | Payer: Self-pay | Source: Ambulatory Visit | Attending: Gastroenterology

## 2013-12-06 ENCOUNTER — Ambulatory Visit (HOSPITAL_COMMUNITY)
Admission: RE | Admit: 2013-12-06 | Discharge: 2013-12-06 | Disposition: A | Discharge status: Home / Self Care | Payer: Medicare HMO | Source: Ambulatory Visit | Attending: Gastroenterology | Admitting: Gastroenterology

## 2013-12-06 ENCOUNTER — Encounter (HOSPITAL_COMMUNITY): Payer: Self-pay | Admitting: *Deleted

## 2013-12-06 VITALS — BP 126/70 | HR 80 | Temp 97.2°F | Resp 18 | Wt 180.0 lb

## 2013-12-06 DIAGNOSIS — R1310 Dysphagia, unspecified: Secondary | ICD-10-CM

## 2013-12-06 DIAGNOSIS — K222 Esophageal obstruction: Secondary | ICD-10-CM | POA: Insufficient documentation

## 2013-12-06 DIAGNOSIS — IMO0002 Reserved for concepts with insufficient information to code with codable children: Secondary | ICD-10-CM | POA: Insufficient documentation

## 2013-12-06 DIAGNOSIS — K449 Diaphragmatic hernia without obstruction or gangrene: Secondary | ICD-10-CM | POA: Insufficient documentation

## 2013-12-06 DIAGNOSIS — B3781 Candidal esophagitis: Secondary | ICD-10-CM | POA: Insufficient documentation

## 2013-12-06 DIAGNOSIS — Y929 Unspecified place or not applicable: Secondary | ICD-10-CM | POA: Insufficient documentation

## 2013-12-06 DIAGNOSIS — T18108A Unspecified foreign body in esophagus causing other injury, initial encounter: Secondary | ICD-10-CM | POA: Insufficient documentation

## 2013-12-06 DIAGNOSIS — T18128A Food in esophagus causing other injury, initial encounter: Secondary | ICD-10-CM | POA: Diagnosis present

## 2013-12-06 DIAGNOSIS — W44F3XA Food entering into or through a natural orifice, initial encounter: Secondary | ICD-10-CM | POA: Diagnosis present

## 2013-12-06 HISTORY — DX: Gastro-esophageal reflux disease without esophagitis: K21.9

## 2013-12-06 HISTORY — DX: Chronic kidney disease, unspecified: N18.9

## 2013-12-06 HISTORY — DX: Shortness of breath: R06.02

## 2013-12-06 HISTORY — PX: ESOPHAGOGASTRODUODENOSCOPY: SHX5428

## 2013-12-06 SURGERY — EGD (ESOPHAGOGASTRODUODENOSCOPY)
Anesthesia: Moderate Sedation

## 2013-12-06 MED ORDER — MIDAZOLAM HCL 10 MG/2ML IJ SOLN
INTRAMUSCULAR | Status: DC | PRN
  Administered 2013-12-06: 2 mg via INTRAVENOUS

## 2013-12-06 MED ORDER — FENTANYL CITRATE 0.05 MG/ML IJ SOLN
INTRAMUSCULAR | Status: AC
  Filled 2013-12-06: qty 4

## 2013-12-06 MED ORDER — MIDAZOLAM HCL 10 MG/2ML IJ SOLN
INTRAMUSCULAR | Status: AC
  Filled 2013-12-06: qty 4

## 2013-12-06 MED ORDER — FENTANYL CITRATE 0.05 MG/ML IJ SOLN
INTRAMUSCULAR | Status: DC | PRN
  Administered 2013-12-06: 25 ug via INTRAVENOUS

## 2013-12-06 MED ORDER — DIPHENHYDRAMINE HCL 50 MG/ML IJ SOLN
INTRAMUSCULAR | Status: AC
  Filled 2013-12-06: qty 1

## 2013-12-06 NOTE — Interval H&P Note (Signed)
History and Physical Interval Note:  12/06/2013 4:35 PM  [name]  has presented today for surgery, with the diagnosis of food impaction  The various methods of treatment have been discussed with the patient and family. After consideration of risks, benefits and other options for treatment, the patient has consented to  Procedure(s): ESOPHAGOGASTRODUODENOSCOPY (EGD) (N/A) as a surgical intervention .  The patient's history has been reviewed, patient examined, no change in status, stable for surgery.  I have reviewed the patient's chart and labs.  Questions were answered to the patient's satisfaction.     [place] C.

## 2013-12-06 NOTE — Op Note (Addendum)
[facility] [place], [address]   ENDOSCOPY PROCEDURE REPORT  PATIENT: [name], [name]  MR#: [id number] BIRTHDATE: [birth year] , 79  yrs. old GENDER: Male  ENDOSCOPIST: Wilford Corner, MD REFERRED BY:Dr. Teena Irani  PROCEDURE DATE:  12/06/2013 PROCEDURE:   EGD w/ fb removal ASA CLASS:   Class III INDICATIONS:Dysphagia.   Foreign body removal from esophagus. MEDICATIONS: Fentanyl 25 mcg IV and Versed 2 mg IV  TOPICAL ANESTHETIC:  DESCRIPTION OF PROCEDURE:   After the risks benefits and alternatives of the procedure were thoroughly explained, informed consent was obtained.  The Pentax Gastroscope [id number]  endoscope was introduced through the mouth and advanced to the second portion of the duodenum , limited by Without limitations.   The instrument was slowly withdrawn as the mucosa was fully examined.     FINDINGS: The endoscope was inserted into the oropharynx and esophagus was intubated. Upon insertion into the esophagus and advancement into the distal esophagus there was a solid piece of food blocking the lumen that could not be advanced. A Jabier Mutton net was used to remove the bolus of food in one pass. The net and food were removed and upon reinsertion of the endoscope a distal esophageal stricture was noted with moderate resistance to passage of a standard adult endoscope (9.8 mm diameter). The endoscope was able to traverse the stricture and there was evidence of partial dilation of the stricture from doing so.  The gastroesophageal junction was noted to be 45 cm from the incisors. Endoscope was advanced into the stomach, which revealed minimal erythema in the antrum and otherwise normal appearing stomach. The endoscope was advanced to the duodenal bulb and second portion of duodenum which were unremarkable.  The endoscope was withdrawn back into the stomach and retroflexion revealed a small hiatal hernia. The endoscope was able to  freely pass the GEJ into the stomach after the initial passage of the endoscope. Upon withdrawal into the proximal esophagus there was a focal area of white plaques consistent with mild Candida esophagitis.  COMPLICATIONS: None  ENDOSCOPIC IMPRESSION:     Food impaction - s/p removal with Jabier Mutton net (see above) Distal esophageal stricture (benign) Small hiatal hernia Mild Candida Esophagitis  RECOMMENDATIONS: PPI QD; Liquid diet today then soft solids and slowly advance. EGD with dilation in 1-2 weeks. Nystatin swish and swallow for 10 days; Avoid meats until dilation done. Small bites, chew well, eat slow.   REPEAT EXAM: N/A  _______________________________ Wilford Corner, MD eSigned:  Wilford Corner, MD 12/06/2013 5:14 PM    CC:  PATIENT NAME:  Reynard, Christoffersen MR#: [id number]

## 2013-12-06 NOTE — H&P (View-Only) (Signed)
Patient ID: [name], male   DOB: 23-Feb-1935, 79 y.o.   MRN: [id number] [facility] Gastroenterology Progress Note  [name] 79 y.o. 1934/11/19   Subjective: Eating a hot dog Saturday and felt like it hung up. Thinks he swallowed the first hot dog fine but the second hot dog felt like it got stuck. Has not been able to keep liquids and saliva since then. Reports intermittent dysphagia especially with meats over the last 2 years but has had previous esophageal dilation X 3 with last one in approximately 2005. Sent from the office to undergo an EGD for the food impaction.  Objective: Vital signs in last 24 hours: Filed Vitals:   12/06/13 1626  BP: 174/100  Pulse: 77  Temp: 97.8  Resp: 17    Physical Exam: Gen: elderly, alert, no acute distress, hard of hearing HEENT: anicteric, no oral lesions CV: RRR Chest: CTA B Abd: soft, NT, ND, +BS Ext: no edema  Lab Results: No results found for this basename: NA, K, CL, CO2, GLUCOSE, BUN, CREATININE, CALCIUM, MG, PHOS,  in the last 72 hours No results found for this basename: AST, ALT, ALKPHOS, BILITOT, PROT, ALBUMIN,  in the last 72 hours No results found for this basename: WBC, NEUTROABS, HGB, HCT, MCV, PLT,  in the last 72 hours No results found for this basename: LABPROT, INR,  in the last 72 hours    Assessment/Plan: 79 yo with history of esophageal stricture and dilations in the past presenting with a food impaction and in need of an EGD for removal of the food impaction.   [place] C. 12/06/2013, 4:29 PM

## 2013-12-06 NOTE — Progress Notes (Signed)
Patient ID: [name] MRN: [id number], DOB: 1935/04/29, 79 y.o. Date of Encounter: 12/06/2013, 10:45 AM    Chief Complaint:  Chief Complaint  Patient presents with  . states can't eat or drink    says gets choked, "it will go down but then comes back up"  hx of esophageal strictures that have needed stretching     HPI: 79 y.o. year old white male is that he suddenly noticed this problem all of a sudden on Saturday 12/04/13. At that time he was eating a hot dog and it got stuck and causes pain in his chest and he finally got it back up. Then he says he has not been able to swallow them anything and keep it down. As anytime he tries to swallow anything it comes back up. He has been able to get down no solid foods. Has been in to get down liquids either. This is not unable to take any of his medicines.  I asked if he had noticed any mild dysphagia prior to Saturday but his response is no. Asked if --a month ago-- if he could swallow a piece of steak-- and he says that he's never been able to swallow steak. When asked about swallowing chicken, he says yet he eats chicken and doesn't have any problem.------- - - -  ???  He reports a history of esophageal dilatation. However when I ask that Dr. does this he has no idea when I asked him how long ago it was he has no idea. I did find in the computer a mention of 03/21/04-- [facility]  As Well I see that omeprazole is on his medication list and he states that he is been taking this daily.     Home Meds:   Outpatient Prescriptions Prior to Visit  Medication Sig Dispense Refill  . aspirin 325 MG tablet Take 325 mg by mouth daily.        . colchicine 0.6 MG tablet 2 at sign of gout flare then 1 one hour after initial administration. Then 1 twice daily until pain resolves.  60 tablet  0  . fluticasone (FLONASE) 50 MCG/ACT nasal spray       . lisinopril (PRINIVIL,ZESTRIL) 5 MG tablet Take 1 tablet (5 mg total) by mouth daily.  90  tablet  3  . metoprolol succinate (TOPROL-XL) 25 MG 24 hr tablet TAKE 1 TABLET EVERY DAY  30 tablet  11  . mometasone (ELOCON) 0.1 % cream Apply 1 application topically daily.  45 g  2  . niacin (NIASPAN) 1000 MG CR tablet Take 1,000 mg by mouth daily.        . nitroGLYCERIN (NITROSTAT) 0.4 MG SL tablet Place 0.4 mg under the tongue every 5 (five) minutes as needed for chest pain.      Marland Kitchen omeprazole (PRILOSEC) 20 MG capsule TAKE ONE CAPSULE BY MOUTH EVERY MORNING  30 capsule  11  . sildenafil (VIAGRA) 100 MG tablet Take 0.5-1 tablets (50-100 mg total) by mouth daily as needed for erectile dysfunction.  5 tablet  11  . tamsulosin (FLOMAX) 0.4 MG CAPS capsule Take 1 capsule (0.4 mg total) by mouth daily.  30 capsule  3  . pravastatin (PRAVACHOL) 20 MG tablet TAKE 1 TABLET (20 MG TOTAL) BY MOUTH DAILY.  30 tablet  4  . simvastatin (ZOCOR) 40 MG tablet TAKE 1 TABLET (40 MG TOTAL) BY MOUTH AT BEDTIME.  30 tablet  6  . sulfamethoxazole-trimethoprim (BACTRIM DS) 800-160  MG per tablet Take 1 tablet by mouth 2 (two) times daily.  30 tablet  0   No facility-administered medications prior to visit.    Allergies:  Allergies  Allergen Reactions  . Celebrex [Celecoxib] Rash    blisters      Review of Systems: See HPI for pertinent ROS. All other ROS negative.    Physical Exam: Blood pressure 126/70, pulse 80, temperature 97.2 F (36.2 C), temperature source Oral, resp. rate 18, weight 180 lb (81.647 kg)., Body mass index is 23.1 kg/(m^2). General:  WNWD WM. Appears in no acute distress. HEENT: Tongue does appear abnormal with some thick coating. Throat Appears normal. I see no mass or obstruction. Neck: Supple. No mass or tenderness with palpation Lungs: Clear bilaterally to auscultation without wheezes, rales, or rhonchi. Breathing is unlabored. Heart: Regular rhythm. No murmurs, rubs, or gallops. Msk:  Strength and tone normal for age. Extremities/Skin: Warm and dry. Neuro: Alert and oriented  X 3. Moves all extremities spontaneously. Gait is normal. CNII-XII grossly in tact. Psych:  Responds to questions appropriately with a normal affect.     ASSESSMENT AND PLAN:  79 y.o. year old male with  1. Dysphagia, unspecified(787.20) - Ambulatory referral to Gastroenterology I have spoken to our referral nurse regarding the situation and the fact that the patient has not been able to get down any medication, liquid, or food for 48 hours now.  Will get him in with  Dr. Doylene Bode group today ASAP.   Signed, [address] [place], [place], [facility] 12/06/2013 10:45 AM

## 2013-12-06 NOTE — Discharge Instructions (Signed)
Liquid diet only today and then advance as tolerated with soft solids. Avoid all meats until repeat endoscopy done in 1-2 weeks. Chew food well, eat slow, and take small bites.

## 2013-12-06 NOTE — Progress Notes (Signed)
Patient ID: [name], male   DOB: [birth year], 79 y.o.   MRN: [id number] [facility] Gastroenterology Progress Note  FRANCOIS ELK 79 y.o. 07/11/1934   Subjective: Eating a hot dog Saturday and felt like it hung up. Thinks he swallowed the first hot dog fine but the second hot dog felt like it got stuck. Has not been able to keep liquids and saliva since then. Reports intermittent dysphagia especially with meats over the last 2 years but has had previous esophageal dilation X 3 with last one in approximately 2005. Sent from the office to undergo an EGD for the food impaction.  Objective: Vital signs in last 24 hours: Filed Vitals:   12/06/13 1626  BP: 174/100  Pulse: 77  Temp: 97.8  Resp: 17    Physical Exam: Gen: elderly, alert, no acute distress, hard of hearing HEENT: anicteric, no oral lesions CV: RRR Chest: CTA B Abd: soft, NT, ND, +BS Ext: no edema  Lab Results: No results found for this basename: NA, K, CL, CO2, GLUCOSE, BUN, CREATININE, CALCIUM, MG, PHOS,  in the last 72 hours No results found for this basename: AST, ALT, ALKPHOS, BILITOT, PROT, ALBUMIN,  in the last 72 hours No results found for this basename: WBC, NEUTROABS, HGB, HCT, MCV, PLT,  in the last 72 hours No results found for this basename: LABPROT, INR,  in the last 72 hours    Assessment/Plan: 79 yo with history of esophageal stricture and dilations in the past presenting with a food impaction and in need of an EGD for removal of the food impaction.   [place] C. 12/06/2013, 4:29 PM

## 2013-12-07 ENCOUNTER — Encounter (HOSPITAL_COMMUNITY): Payer: Self-pay | Admitting: Gastroenterology

## 2013-12-17 ENCOUNTER — Other Ambulatory Visit: Payer: Self-pay

## 2013-12-17 MED ORDER — METOPROLOL SUCCINATE ER 25 MG PO TB24: 25.0000 mg | ORAL_TABLET | Freq: Every day | ORAL | Status: DC

## 2013-12-17 NOTE — Telephone Encounter (Signed)
metoprolol succinate (TOPROL-XL) 25 MG 24 hr tablet  Take 1 tablet (25 mg total) by mouth daily.   30 tablet   12    Patient Instructions    Your physician wants you to follow-up in: [place] will receive a reminder letter in the mail two months in advance. If you don't receive a letter, please call our office to schedule the follow-up appointment.    Your physician has requested that you have a carotid duplex. This test is an ultrasound of the carotid arteries in your neck. It looks at blood flow through these arteries that supply the brain with blood. Allow one hour for this exam. There are no restrictions or special instructions.    START LISINOPRIL 5 MG ONCE DAILY   Your physician recommends that you return for lab work in: ONE WEEK= DO NOT EAT PRIOR TO LABS          Patient Instructions History Recorded          Previous Visit      Provider Department Encounter #    05/27/2012  9:23 AM Kirk Ruths, MD [facility] [id number]

## 2013-12-22 ENCOUNTER — Emergency Department (HOSPITAL_COMMUNITY): Payer: Medicare HMO

## 2013-12-22 ENCOUNTER — Emergency Department (HOSPITAL_COMMUNITY)
Admission: EM | Admit: 2013-12-22 | Discharge: 2013-12-23 | Disposition: A | Discharge status: Home / Self Care | Payer: Medicare HMO | Attending: Emergency Medicine | Admitting: Emergency Medicine

## 2013-12-22 ENCOUNTER — Encounter (HOSPITAL_COMMUNITY): Payer: Self-pay | Admitting: Emergency Medicine

## 2013-12-22 DIAGNOSIS — R1013 Epigastric pain: Secondary | ICD-10-CM | POA: Insufficient documentation

## 2013-12-22 DIAGNOSIS — Z79899 Other long term (current) drug therapy: Secondary | ICD-10-CM | POA: Insufficient documentation

## 2013-12-22 DIAGNOSIS — Z951 Presence of aortocoronary bypass graft: Secondary | ICD-10-CM | POA: Insufficient documentation

## 2013-12-22 DIAGNOSIS — I129 Hypertensive chronic kidney disease with stage 1 through stage 4 chronic kidney disease, or unspecified chronic kidney disease: Secondary | ICD-10-CM | POA: Insufficient documentation

## 2013-12-22 DIAGNOSIS — Z9889 Other specified postprocedural states: Secondary | ICD-10-CM | POA: Insufficient documentation

## 2013-12-22 DIAGNOSIS — I2510 Atherosclerotic heart disease of native coronary artery without angina pectoris: Secondary | ICD-10-CM | POA: Insufficient documentation

## 2013-12-22 DIAGNOSIS — G8918 Other acute postprocedural pain: Secondary | ICD-10-CM | POA: Insufficient documentation

## 2013-12-22 DIAGNOSIS — E785 Hyperlipidemia, unspecified: Secondary | ICD-10-CM | POA: Insufficient documentation

## 2013-12-22 DIAGNOSIS — R112 Nausea with vomiting, unspecified: Secondary | ICD-10-CM | POA: Insufficient documentation

## 2013-12-22 DIAGNOSIS — Z7982 Long term (current) use of aspirin: Secondary | ICD-10-CM | POA: Insufficient documentation

## 2013-12-22 DIAGNOSIS — R079 Chest pain, unspecified: Secondary | ICD-10-CM | POA: Insufficient documentation

## 2013-12-22 DIAGNOSIS — F17200 Nicotine dependence, unspecified, uncomplicated: Secondary | ICD-10-CM | POA: Insufficient documentation

## 2013-12-22 DIAGNOSIS — K219 Gastro-esophageal reflux disease without esophagitis: Secondary | ICD-10-CM | POA: Insufficient documentation

## 2013-12-22 DIAGNOSIS — N189 Chronic kidney disease, unspecified: Secondary | ICD-10-CM | POA: Insufficient documentation

## 2013-12-22 LAB — CBC WITH DIFFERENTIAL/PLATELET
Basophils Absolute: 0 10*3/uL (ref 0.0–0.1)
Basophils Relative: 0 % (ref 0–1)
Eosinophils Absolute: 0.3 10*3/uL (ref 0.0–0.7)
Eosinophils Relative: 3 % (ref 0–5)
HCT: 39.9 % (ref 39.0–52.0)
Hemoglobin: 13.1 g/dL (ref 13.0–17.0)
LYMPHS ABS: 2.1 10*3/uL (ref 0.7–4.0)
LYMPHS PCT: 18 % (ref 12–46)
MCH: 29.4 pg (ref 26.0–34.0)
MCHC: 32.8 g/dL (ref 30.0–36.0)
MCV: 89.5 fL (ref 78.0–100.0)
MONO ABS: 1.5 10*3/uL — AB (ref 0.1–1.0)
Monocytes Relative: 13 % — ABNORMAL HIGH (ref 3–12)
Neutro Abs: 7.7 10*3/uL (ref 1.7–7.7)
Neutrophils Relative %: 66 % (ref 43–77)
PLATELETS: 236 10*3/uL (ref 150–400)
RBC: 4.46 MIL/uL (ref 4.22–5.81)
RDW: 14.8 % (ref 11.5–15.5)
WBC: 11.6 10*3/uL — AB (ref 4.0–10.5)

## 2013-12-22 LAB — I-STAT CHEM 8, ED
BUN: 13 mg/dL (ref 6–23)
CREATININE: 1.2 mg/dL (ref 0.50–1.35)
Calcium, Ion: 1.17 mmol/L (ref 1.13–1.30)
Chloride: 105 mEq/L (ref 96–112)
Glucose, Bld: 130 mg/dL — ABNORMAL HIGH (ref 70–99)
HCT: 44 % (ref 39.0–52.0)
Hemoglobin: 15 g/dL (ref 13.0–17.0)
Potassium: 4.2 mEq/L (ref 3.7–5.3)
SODIUM: 141 meq/L (ref 137–147)
TCO2: 23 mmol/L (ref 0–100)

## 2013-12-22 LAB — I-STAT TROPONIN, ED: Troponin i, poc: 0 ng/mL (ref 0.00–0.08)

## 2013-12-22 MED ORDER — HYDROMORPHONE HCL PF 1 MG/ML IJ SOLN
1.0000 mg | Freq: Once | INTRAMUSCULAR | Status: AC
  Administered 2013-12-22: 1 mg via INTRAVENOUS
  Filled 2013-12-22: qty 1

## 2013-12-22 MED ORDER — FENTANYL CITRATE 0.05 MG/ML IJ SOLN
50.0000 ug | Freq: Once | INTRAMUSCULAR | Status: AC
  Administered 2013-12-22: 50 ug via INTRAVENOUS
  Filled 2013-12-22: qty 2

## 2013-12-22 MED ORDER — ONDANSETRON HCL 4 MG/2ML IJ SOLN
4.0000 mg | Freq: Once | INTRAMUSCULAR | Status: AC
  Administered 2013-12-22: 4 mg via INTRAVENOUS
  Filled 2013-12-22: qty 2

## 2013-12-22 NOTE — ED Notes (Signed)
Went to [facility] and patient was at XR

## 2013-12-22 NOTE — ED Notes (Signed)
Pt in stating he had his esophagus  stretched this morning and this afternoon developed abd pain that has increased, unable to keep any food down, pt diaphoretic in triage

## 2013-12-22 NOTE — ED Provider Notes (Signed)
CSN: [id number]     Arrival date & time 12/22/13  1902 History   First MD Initiated Contact with Patient 12/22/13 1914     Chief Complaint  Patient presents with  . Abdominal Pain     (Consider location/radiation/quality/duration/timing/severity/associated sxs/prior Treatment) Patient is a 79 y.o. male presenting with abdominal pain. The history is provided by the patient.  Abdominal Pain Pain location:  Epigastric Associated symptoms: chest pain, nausea and vomiting   Associated symptoms: no diarrhea, no fever and no shortness of breath    patient presents with epigastric and chest pain. He had his esophagus dilated by Dr. Amedeo Plenty in the office today after a meat impaction a couple weeks ago. He developed some pain after he got home. He states he was able to tolerate Herbert Pun with no difficulty this morning but then has been throwing everything up since. He denies fevers. No blood in the emesis. No fevers. He has felt a little lightheaded.  Past Medical History  Diagnosis Date  . Atrial flutter     and SVT that is post ablation  . Acute venous embolism and thrombosis of unspecified deep vessels of lower extremity   . Dyslipidemia   . CAD (coronary artery disease)   . HTN (hypertension)   . Shortness of breath     on exertion  . Chronic kidney disease     stones  . GERD (gastroesophageal reflux disease)    Past Surgical History  Procedure Laterality Date  . Cardiac catheterization      s/p cardiac catheterization and bypass surgery as well as EGD with dilation x3, left lower extremity vein stripping  . Left knee anthroscopy    . Coronary artery bypass graft    . Secondary closure arm    . Esophagogastroduodenoscopy N/A 12/06/2013    Procedure: ESOPHAGOGASTRODUODENOSCOPY (EGD);  Surgeon: Lear Ng, MD;  Location: [facility];  Service: Endoscopy;  Laterality: N/A;   History reviewed. No pertinent family history. History  Substance Use Topics  . Smoking status: Current  Every Day Smoker -- 1.00 packs/day    Types: Cigarettes  . Smokeless tobacco: Never Used     Comment: greater than 50 pack year history  . Alcohol Use: No    Review of Systems  Constitutional: Negative for fever, activity change and appetite change.  Eyes: Negative for pain.  Respiratory: Negative for chest tightness and shortness of breath.   Cardiovascular: Positive for chest pain. Negative for leg swelling.  Gastrointestinal: Positive for nausea, vomiting and abdominal pain. Negative for diarrhea.  Genitourinary: Negative for flank pain.  Musculoskeletal: Negative for back pain and neck stiffness.  Skin: Negative for rash.  Neurological: Negative for weakness, numbness and headaches.  Psychiatric/Behavioral: Negative for behavioral problems.      Allergies  Celebrex  Home Medications   Prior to Admission medications   Medication Sig Start Date End Date Taking? Authorizing Provider  aspirin 325 MG tablet Take 325 mg by mouth daily.     Yes Historical Provider, MD  colchicine 0.6 MG tablet 2 at sign of gout flare then 1 one hour after initial administration. Then 1 twice daily until pain resolves. 10/25/13  Yes Mary B Dixon, PA-C  fluticasone (FLONASE) 50 MCG/ACT nasal spray Place 1 spray into the nose daily as needed for allergies.  10/30/10  Yes Historical Provider, MD  lisinopril (PRINIVIL,ZESTRIL) 5 MG tablet Take 1 tablet (5 mg total) by mouth daily. 11/10/13  Yes Lelon Perla, MD  metoprolol succinate (TOPROL-XL)  25 MG 24 hr tablet Take 1 tablet (25 mg total) by mouth daily. 12/17/13  Yes Lelon Perla, MD  niacin (NIASPAN) 1000 MG CR tablet Take 1,000 mg by mouth daily.     Yes Historical Provider, MD  nitroGLYCERIN (NITROSTAT) 0.4 MG SL tablet Place 0.4 mg under the tongue every 5 (five) minutes as needed for chest pain. 11/14/11  Yes Lelon Perla, MD  omeprazole (PRILOSEC) 20 MG capsule Take 20 mg by mouth daily.   Yes Historical Provider, MD  pravastatin  (PRAVACHOL) 20 MG tablet Take 20 mg by mouth daily.   Yes Historical Provider, MD  sildenafil (VIAGRA) 100 MG tablet Take 0.5-1 tablets (50-100 mg total) by mouth daily as needed for erectile dysfunction. 03/11/13  Yes Susy Frizzle, MD  simvastatin (ZOCOR) 40 MG tablet Take 40 mg by mouth at bedtime.   Yes Historical Provider, MD  tamsulosin (FLOMAX) 0.4 MG CAPS capsule Take 0.4 mg by mouth daily.   Yes Historical Provider, MD  oxyCODONE-acetaminophen (PERCOCET/ROXICET) 5-325 MG per tablet Take 1-2 tablets by mouth every 6 (six) hours as needed for severe pain. 12/23/13   Jasper Riling. Teegan Guinther, MD   BP 147/60  Pulse 62  Temp(Src) 98 F (36.7 C) (Oral)  Resp 14  SpO2 99% Physical Exam  Nursing note and vitals reviewed. Constitutional: He is oriented to person, place, and time. He appears well-developed and well-nourished.  Patient appears uncomfortable  HENT:  Head: Normocephalic and atraumatic.  Eyes: EOM are normal. Pupils are equal, round, and reactive to light.  Neck: Normal range of motion. Neck supple.  Cardiovascular: Normal rate, regular rhythm and normal heart sounds.   No murmur heard. Pulmonary/Chest: Effort normal and breath sounds normal.  Abdominal: Soft. He exhibits no distension and no mass. There is tenderness. There is no rebound and no guarding.  Epigastric tenderness without rebound or guarding  Musculoskeletal: Normal range of motion. He exhibits no edema.  Neurological: He is alert and oriented to person, place, and time. No cranial nerve deficit.  Skin: Skin is warm and dry.  Psychiatric: He has a normal mood and affect.    ED Course  Procedures (including critical care time) Labs Review Labs Reviewed  CBC WITH DIFFERENTIAL - Abnormal; Notable for the following:    WBC 11.6 (*)    Monocytes Relative 13 (*)    Monocytes Absolute 1.5 (*)    All other components within normal limits  I-STAT CHEM 8, ED - Abnormal; Notable for the following:    Glucose, Bld  130 (*)    All other components within normal limits  I-STAT TROPOININ, ED    Imaging Review Dg Chest 2 View  12/22/2013   CLINICAL DATA:  Mid chest discomfort status post esophageal dilation today  EXAM: CHEST  2 VIEW  COMPARISON:  Chest x-ray of April 20, 2007  FINDINGS: The lungs are well-expanded and clear. There is no pleural effusion, pneumothorax, nor pneumomediastinum. The cardiac silhouette is normal. The pulmonary vascularity is not engorged. The patient has undergone previous CABG and internal mammary artery dissection. There are 7 intact sternal wires present.  IMPRESSION: 1. There is no mediastinal widening, pneumomediastinum, nor pleural effusion. 2. Mild hyperinflation is consistent with COPD. There is no evidence of pneumonia nor CHF.   Electronically Signed   By: David  Martinique   On: 12/22/2013 21:19   Ct Chest Wo Contrast  12/22/2013   CLINICAL DATA:  Status post esophageal dilatation with chest pain.  EXAM:  CT CHEST WITHOUT CONTRAST  TECHNIQUE: Multidetector CT imaging of the chest was performed following the standard protocol without IV contrast.  COMPARISON:  Abdominal CT 02/10/2013  FINDINGS: THORACIC INLET/BODY WALL:  No acute abnormality.  MEDIASTINUM:  No cardiomegaly or pericardial effusion. There is diffuse coronary artery atherosclerotic calcification, status post CABG including the LIMA. No acute vascular findings. There is diffuse circumferential thickening of the esophagus. The periesophageal fat is mildly hazy, especially at the level of the transverse arch, lower third, and GE junction. There is no pneumomediastinum or evidence of fluid collection. There has been recent endoscopy, which was reportedly negative for malignancy.  LUNG WINDOWS:  Mild centrilobular and paraseptal emphysema. No consolidation, edema, air leak, or effusion. There are 3 4 mm pulmonary nodules in the right upper lobe, seen on images 16 and 18.  UPPER ABDOMEN:  Marked fatty atrophy of the pancreas.   OSSEOUS:  No acute fracture.  No suspicious lytic or blastic lesions.  IMPRESSION: 1. Diffuse esophageal thickening and periesophageal edema. These changes are likely reactive to the recent esophageal dilatation, but could also be from pre-existing esophagitis. No mediastinalfluid collection or pneumomediastinum to suggest perforation. 2. 4 mm pulmonary nodules in the right upper lobe. Given risk factors for bronchogenic carcinoma, follow-up chest CT at 1 year is recommended. This recommendation follows the consensus statement: Guidelines for Management of Small Pulmonary Nodules Detected on CT Scans: A Statement from the [employer] as published in Radiology 2005; 237:395-400.   Electronically Signed   By: Jorje Guild M.D.   On: 12/22/2013 23:07     EKG Interpretation None      MDM   Final diagnoses:  Post-operative pain    Patient with epigastric pain nausea and vomiting after esophageal dilatation. Lab work overall reassuring except for mildly elevated white count. X-ray reassuring. CT scan shows no perforation. Patient feels better as tolerated orals. Will be discharged followup with gastroenterology as needed    Jasper Riling. Alvino Chapel, MD 12/23/13 0383

## 2013-12-22 NOTE — ED Notes (Signed)
Went to [facility] and patient was at CT

## 2013-12-23 MED ORDER — OXYCODONE-ACETAMINOPHEN 5-325 MG PO TABS: 1.0000 | ORAL_TABLET | Freq: Four times a day (QID) | ORAL | Status: DC | PRN

## 2013-12-23 NOTE — ED Notes (Signed)
Pt have a Gingered soda, no c/o abd pain or nausea at this time.

## 2013-12-23 NOTE — ED Notes (Signed)
Belongings given to the pt and his family.

## 2014-01-04 ENCOUNTER — Ambulatory Visit (INDEPENDENT_AMBULATORY_CARE_PROVIDER_SITE_OTHER): Payer: Medicare HMO | Admitting: Cardiology

## 2014-01-04 ENCOUNTER — Encounter (HOSPITAL_COMMUNITY): Payer: Self-pay | Admitting: *Deleted

## 2014-01-04 ENCOUNTER — Encounter: Payer: Self-pay | Admitting: Cardiology

## 2014-01-04 ENCOUNTER — Encounter: Payer: Self-pay | Admitting: *Deleted

## 2014-01-04 VITALS — BP 128/60 | HR 59 | Ht 74.0 in | Wt 184.0 lb

## 2014-01-04 DIAGNOSIS — I10 Essential (primary) hypertension: Secondary | ICD-10-CM

## 2014-01-04 DIAGNOSIS — E785 Hyperlipidemia, unspecified: Secondary | ICD-10-CM

## 2014-01-04 DIAGNOSIS — F17200 Nicotine dependence, unspecified, uncomplicated: Secondary | ICD-10-CM

## 2014-01-04 DIAGNOSIS — I4892 Unspecified atrial flutter: Secondary | ICD-10-CM

## 2014-01-04 NOTE — Assessment & Plan Note (Signed)
Patient counseled on discontinuing. 

## 2014-01-04 NOTE — Patient Instructions (Signed)
Your physician wants you to follow-up in: [place] will receive a reminder letter in the mail two months in advance. If you don't receive a letter, please call our office to schedule the follow-up appointment.   Your physician has requested that you have a lexiscan myoview. For further information please visit [email or web address]. Please follow instruction sheet, as given.

## 2014-01-04 NOTE — Progress Notes (Signed)
HPI: FU coronary artery disease, status post coronary bypassing graft. This was performed in 1998. His last catheterization in October 2008 showed a patent LIMA to the LAD and a patent saphenous vein graft to the right coronary artery. However, he did have significant left main disease that bifurcated into the circumflex. He had a drug-eluting stent to his left main at that time. His last Myoview was performed in August of 2012. At that time, his ejection fraction was 63% and there was inferior thinning and possible mild inferior ischemia; we are treating medically. He also has a history of SVT and atrial flutter ablation. Abdominal ultrasound in August of 2011 showed no aneurysm. Carotid Dopplers in June 2015 showed a 60-79% right and 40-59% left stenosis. Followup recommended in one year. Since I last saw him May 2014, the patient has dyspnea with more extreme activities but not with routine activities. It is relieved with rest. It is not associated with chest pain. There is no orthopnea, PND or pedal edema. There is no syncope or palpitations. There is no exertional chest pain.   Current Outpatient Prescriptions  Medication Sig Dispense Refill  . aspirin 325 MG tablet Take 325 mg by mouth daily.        . colchicine 0.6 MG tablet 2 at sign of gout flare then 1 one hour after initial administration. Then 1 twice daily until pain resolves.  60 tablet  0  . lisinopril (PRINIVIL,ZESTRIL) 5 MG tablet Take 1 tablet (5 mg total) by mouth daily.  90 tablet  3  . metoprolol succinate (TOPROL-XL) 25 MG 24 hr tablet Take 1 tablet (25 mg total) by mouth daily.  30 tablet  0  . nitroGLYCERIN (NITROSTAT) 0.4 MG SL tablet Place 0.4 mg under the tongue every 5 (five) minutes as needed for chest pain.      Marland Kitchen omeprazole (PRILOSEC) 20 MG capsule Take 20 mg by mouth daily.      . pravastatin (PRAVACHOL) 20 MG tablet Take 20 mg by mouth daily.       No current facility-administered medications for this visit.      Past Medical History  Diagnosis Date  . Atrial flutter     and SVT that is post ablation  . Acute venous embolism and thrombosis of unspecified deep vessels of lower extremity   . Dyslipidemia   . CAD (coronary artery disease)   . HTN (hypertension)   . Shortness of breath     on exertion  . Chronic kidney disease     stones  . GERD (gastroesophageal reflux disease)     Past Surgical History  Procedure Laterality Date  . Cardiac catheterization      s/p cardiac catheterization and bypass surgery as well as EGD with dilation x3, left lower extremity vein stripping  . Left knee anthroscopy    . Coronary artery bypass graft    . Secondary closure arm    . Esophagogastroduodenoscopy N/A 12/06/2013    Procedure: ESOPHAGOGASTRODUODENOSCOPY (EGD);  Surgeon: Lear Ng, MD;  Location: [facility];  Service: Endoscopy;  Laterality: N/A;    History   Social History  . Marital Status: Married    Spouse Name: N/A    Number of Children: N/A  . Years of Education: N/A   Occupational History  . Not on file.   Social History Main Topics  . Smoking status: Current Every Day Smoker -- 1.00 packs/day    Types: Cigarettes  . Smokeless tobacco:  Never Used     Comment: greater than 50 pack year history  . Alcohol Use: No  . Drug Use: No  . Sexual Activity: Not on file   Other Topics Concern  . 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     ROS: no fevers or chills, productive cough, hemoptysis, dysphasia, odynophagia, melena, hematochezia, dysuria, hematuria, rash, seizure activity, orthopnea, PND, pedal edema, claudication. Remaining systems are negative.  Physical Exam: Well-developed well-nourished in no acute distress.  Skin is warm and dry.  HEENT is normal.  Neck is supple.  Chest is clear to auscultation with normal  expansion.  Cardiovascular exam is regular rate and rhythm.  Abdominal exam nontender or distended. No masses palpated. Extremities show no edema. neuro grossly intact  ECG Sinus rhythm at a rate of 59. No ST changes

## 2014-01-04 NOTE — Assessment & Plan Note (Signed)
Continue aspirin and statin. Follow-up carotid Dopplers June 2016. 

## 2014-01-04 NOTE — Assessment & Plan Note (Signed)
Blood pressure controlled. Continue present medications. 

## 2014-01-04 NOTE — Assessment & Plan Note (Signed)
Continue aspirin and statin. He has noted increased dyspnea on exertion. Schedule nuclear study to quantify LV function and to exclude ischemia.

## 2014-01-04 NOTE — Assessment & Plan Note (Signed)
Continue statin. 

## 2014-01-06 ENCOUNTER — Telehealth (HOSPITAL_COMMUNITY): Payer: Self-pay

## 2014-01-06 NOTE — Telephone Encounter (Signed)
Encounter complete. 

## 2014-01-07 ENCOUNTER — Telehealth (HOSPITAL_COMMUNITY): Payer: Self-pay

## 2014-01-07 NOTE — Telephone Encounter (Signed)
Encounter complete. 

## 2014-01-11 ENCOUNTER — Telehealth (HOSPITAL_COMMUNITY): Payer: Self-pay

## 2014-01-11 ENCOUNTER — Ambulatory Visit (HOSPITAL_COMMUNITY)
Admission: RE | Admit: 2014-01-11 | Discharge: 2014-01-11 | Disposition: A | Discharge status: Home / Self Care | Payer: Medicare HMO | Source: Ambulatory Visit | Attending: Cardiovascular Disease | Admitting: Cardiovascular Disease

## 2014-01-11 DIAGNOSIS — F17200 Nicotine dependence, unspecified, uncomplicated: Secondary | ICD-10-CM

## 2014-01-11 DIAGNOSIS — I4892 Unspecified atrial flutter: Secondary | ICD-10-CM | POA: Insufficient documentation

## 2014-01-11 DIAGNOSIS — I10 Essential (primary) hypertension: Secondary | ICD-10-CM

## 2014-01-11 DIAGNOSIS — E785 Hyperlipidemia, unspecified: Secondary | ICD-10-CM | POA: Insufficient documentation

## 2014-01-11 MED ORDER — AMINOPHYLLINE 25 MG/ML IV SOLN
75.0000 mg | Freq: Once | INTRAVENOUS | Status: AC
  Administered 2014-01-11: 75 mg via INTRAVENOUS

## 2014-01-11 MED ORDER — TECHNETIUM TC 99M SESTAMIBI GENERIC - CARDIOLITE
10.7000 | Freq: Once | INTRAVENOUS | Status: AC | PRN
  Administered 2014-01-11: 11 via INTRAVENOUS

## 2014-01-11 MED ORDER — TECHNETIUM TC 99M SESTAMIBI GENERIC - CARDIOLITE
30.4000 | Freq: Once | INTRAVENOUS | Status: AC | PRN
  Administered 2014-01-11: 30 via INTRAVENOUS

## 2014-01-11 MED ORDER — REGADENOSON 0.4 MG/5ML IV SOLN
0.4000 mg | Freq: Once | INTRAVENOUS | Status: AC
  Administered 2014-01-11: 0.4 mg via INTRAVENOUS

## 2014-01-11 NOTE — Procedures (Addendum)
[facility] [address] [place] [id number] [phone]  Cardiology Nuclear Med Study  [name] is a 79 y.o. male     MRN : [id number]     DOB: [birth year]  Procedure Date: 01/11/2014  Nuclear Med Background Indication for Stress Test:  Graft Patency, Stent Patency and Post Hospital History:  CAD;MI;CABG-1998;STENT/PTCA-03/2007;AFIB;SVT-post ablation;cerebrovascular disease;Last NUC MPI in 01/2011-ischemia-EF=63% Cardiac Risk Factors: Carotid Disease, Family History - CAD, Hypertension, Lipids, Smoker and DVT  Symptoms:  Chest Pain, DOE, Fatigue, Nausea, Palpitations and SOB   Nuclear Pre-Procedure Caffeine/Decaff Intake:  1:00am NPO After: 11am   IV Site: R Forearm  IV 0.9% NS with Angio Cath:  22g  Chest Size (in):  44"  IV Started by: Rolene Course, RN  Height: 6\' 2"  (1.88 m)  Cup Size: n/a  BMI:  Body mass index is 23.1 kg/(m^2). Weight:  180 lb (81.647 kg)   Tech Comments:  n/a    Nuclear Med Study 1 or 2 day study: 1 day  Stress Test Type:  [place] Provider:  Kirk Ruths, MD   Resting Radionuclide: Technetium 36m Sestamibi  Resting Radionuclide Dose: 10.7 mCi   Stress Radionuclide:  Technetium 39m Sestamibi  Stress Radionuclide Dose: 30.4 mCi           Stress Protocol Rest HR: 53 Stress HR: 74  Rest BP:138/70 Stress BP: 153/74  Exercise Time (min): n/a METS: n/a          Dose of Adenosine (mg):  n/a Dose of Lexiscan: 0.4 mg  Dose of Atropine (mg): n/a Dose of Dobutamine: n/a mcg/kg/min (at max HR)  Stress Test Technologist: Mellody Memos, CCT Nuclear Technologist: Imagene Riches, CNMT   Rest Procedure:  Myocardial perfusion imaging was performed at rest 45 minutes following the intravenous administration of Technetium 80m Sestamibi. Stress Procedure:  The patient received IV Lexiscan 0.4 mg over 15-seconds.  Technetium 78m Sestamibi injected IV at 30-seconds.  Patient  experienced shortness of breath, head pain, dizziness and was administered 75 mg of Aminophylline IV at 5 minutes.There were no significant changes with Lexiscan.  Quantitative spect images were obtained after a 45 minute delay.  Transient Ischemic Dilatation (Normal <1.22):  1.13  QGS EDV:  85 ml QGS ESV:  41 ml LV Ejection Fraction: 53%        Rest ECG: NSR - Normal EKG  Stress ECG: No significant change from baseline ECG  QPS Raw Data Images:  Normal; no motion artifact; normal heart/lung ratio. Stress Images:  Normal homogeneous uptake in all areas of the myocardium. Rest Images:  Normal homogeneous uptake in all areas of the myocardium. Subtraction (SDS):  No evidence of ischemia.  Impression Exercise Capacity:  Lexiscan with no exercise. BP Response:  Normal blood pressure response. Clinical Symptoms:  No significant symptoms noted. ECG Impression:  No significant ST segment change suggestive of ischemia. Comparison with Prior Nuclear Study: Perfusion improved since prior study which showed inferior ischemia  Overall Impression:  Normal stress nuclear study.  LV Wall Motion:  NL LV Function; NL Wall Motion   Lorretta Harp, MD  01/11/2014 5:14 PM

## 2014-01-11 NOTE — Telephone Encounter (Signed)
Encounter complete. 

## 2014-01-12 ENCOUNTER — Other Ambulatory Visit: Payer: Self-pay | Admitting: Family Medicine

## 2014-01-12 NOTE — Telephone Encounter (Signed)
Refill denied 

## 2014-01-18 ENCOUNTER — Other Ambulatory Visit: Payer: Self-pay

## 2014-01-18 MED ORDER — METOPROLOL SUCCINATE ER 25 MG PO TB24: 25.0000 mg | ORAL_TABLET | Freq: Every day | ORAL | Status: DC

## 2014-01-24 ENCOUNTER — Other Ambulatory Visit: Payer: Self-pay | Admitting: Family Medicine

## 2014-03-21 ENCOUNTER — Other Ambulatory Visit: Payer: Self-pay | Admitting: Family Medicine

## 2014-03-21 ENCOUNTER — Encounter: Payer: Self-pay | Admitting: Family Medicine

## 2014-03-21 ENCOUNTER — Ambulatory Visit (INDEPENDENT_AMBULATORY_CARE_PROVIDER_SITE_OTHER): Payer: Medicare HMO | Admitting: Family Medicine

## 2014-03-21 VITALS — BP 128/64 | HR 66 | Temp 98.0°F | Resp 20 | Ht 74.0 in | Wt 183.0 lb

## 2014-03-21 DIAGNOSIS — L301 Dyshidrosis [pompholyx]: Secondary | ICD-10-CM

## 2014-03-21 MED ORDER — PREDNISONE 20 MG PO TABS
ORAL_TABLET | ORAL | Status: DC
Start: 2014-03-21 — End: 2014-04-01

## 2014-03-21 NOTE — Progress Notes (Signed)
Subjective:    Patient ID: [name], male    DOB: 1935-01-29, 79 y.o.   MRN: [id number]  HPI Patient has an itchy rash on both hands for one week. Skin on both hands is cracked and dry and hyperkeratotic. There are erythematous papules on the dorsums of the joints of his fingers. Some of these papules have cracked open an hour draining cyst fluid. 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 But appears to be dyshidrotic eczema. He also hasn't erythematous scaly circular patch on the posterior aspect of his left calf. This is been there for quite some time. It is extremely itchy. He has tried numerous steroid creams with no benefit. 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  I have recommended a biopsy to confirm dermatology consultation. The patient like to defer on that for now. Past Medical History  Diagnosis Date  . Atrial flutter     and SVT that is post ablation  . Acute venous embolism and thrombosis of unspecified deep vessels of lower extremity   . Dyslipidemia   . CAD (coronary artery disease)   . HTN (hypertension)   . Shortness of breath     on exertion  . Chronic kidney disease     stones  . GERD (gastroesophageal reflux disease)    Current Outpatient Prescriptions on File Prior to Visit  Medication Sig Dispense Refill  . aspirin 325 MG tablet Take 325 mg by mouth daily.        . colchicine 0.6 MG tablet 2 at sign of gout flare then 1 one hour after initial administration. Then 1 twice daily until pain resolves.  60 tablet  0  . lisinopril (PRINIVIL,ZESTRIL) 5 MG tablet Take 1 tablet (5 mg total) by mouth daily.  90 tablet  3  . metoprolol succinate (TOPROL-XL) 25 MG 24 hr tablet Take 1 tablet (25 mg total) by mouth daily.  30 tablet  6  . nitroGLYCERIN (NITROSTAT) 0.4 MG SL tablet Place 0.4 mg under the tongue every 5 (five) minutes as needed for chest pain.      Marland Kitchen omeprazole (PRILOSEC) 20  MG capsule Take 20 mg by mouth daily.      . pravastatin (PRAVACHOL) 20 MG tablet TAKE 1 TABLET EVERY DAY  30 tablet  4   No current facility-administered medications on file prior to visit.   Allergies  Allergen Reactions  . Celebrex [Celecoxib] Rash    blisters   History   Social History  . Marital Status: Married    Spouse Name: N/A    Number of Children: N/A  . Years of Education: N/A   Occupational History  . Not on file.   Social History Main Topics  . Smoking status: Current Every Day Smoker -- 1.00 packs/day    Types: Cigarettes  . Smokeless tobacco: Never Used     Comment: greater than 50 pack year history  . Alcohol Use: No  . Drug Use: No  . Sexual Activity: Not on file   Other Topics Concern  . 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        Review of Systems  All other systems reviewed and are negative.      Objective:   Physical  Exam  Vitals reviewed. Cardiovascular: Normal rate, regular rhythm and normal heart sounds.   Pulmonary/Chest: Effort normal and breath sounds normal. No respiratory distress. He has no wheezes. He has no rales.  Skin: Rash noted. There is erythema.          Assessment & Plan:  Dyshidrotic eczema - Plan: predniSONE (DELTASONE) 20 MG tablet  Patient has dyshidrotic eczema on his hands. I started him on a prednisone taper pack. Also recommended he apply petroleum jelly to his hands at night and sleep in gloves.  Anticipate that the rash improved dramatically after one week. I recommended either a dermatology consultation punch biopsy to determine what the rash on his posterior left calf this. The patient defers that for now.

## 2014-03-21 NOTE — Telephone Encounter (Signed)
Medication refilled per protocol. 

## 2014-04-01 ENCOUNTER — Encounter: Payer: Self-pay | Admitting: Family Medicine

## 2014-04-01 ENCOUNTER — Ambulatory Visit (INDEPENDENT_AMBULATORY_CARE_PROVIDER_SITE_OTHER): Payer: Medicare HMO | Admitting: Family Medicine

## 2014-04-01 VITALS — BP 98/50 | HR 68 | Temp 98.0°F | Resp 20 | Ht 74.0 in | Wt 178.0 lb

## 2014-04-01 DIAGNOSIS — J309 Allergic rhinitis, unspecified: Secondary | ICD-10-CM

## 2014-04-01 DIAGNOSIS — L0291 Cutaneous abscess, unspecified: Secondary | ICD-10-CM

## 2014-04-01 DIAGNOSIS — L0390 Cellulitis, unspecified: Secondary | ICD-10-CM

## 2014-04-01 MED ORDER — DOXYCYCLINE HYCLATE 100 MG PO TABS: 100.0000 mg | ORAL_TABLET | Freq: Two times a day (BID) | ORAL | Status: DC

## 2014-04-01 MED ORDER — FLUTICASONE PROPIONATE 50 MCG/ACT NA SUSP: 2.0000 | Freq: Every day | NASAL | Status: DC

## 2014-04-01 NOTE — Progress Notes (Signed)
Subjective:    Patient ID: [name], male    DOB: 1934-10-01, 79 y.o.   MRN: [id number]  HPI Patient returns one week of rhinorrhea, sneezing, itchy watery eyes. He also reports sinus congestion.  Patient also reports epigastric discomfort. He complains of worsening indigestion. The epigastric discomfort radiates around his left upper quadrant into his back. It is exacerbated by food. He denies any chest pain or shortness of breath or dyspnea on exertion. The pain is unrelated to exercise.  The patient has a 4 cm x 3 cm erythematous boil on his right lower quadrant of his abdomen. It has been gradually enlarging for the last week.  The center is approximately 2 cm x 1 cm. It is oozing purulent material from multiple pinpoint holes. Past Medical History  Diagnosis Date  . Atrial flutter     and SVT that is post ablation  . Acute venous embolism and thrombosis of unspecified deep vessels of lower extremity   . Dyslipidemia   . CAD (coronary artery disease)   . HTN (hypertension)   . Shortness of breath     on exertion  . Chronic kidney disease     stones  . GERD (gastroesophageal reflux disease)    Past Surgical History  Procedure Laterality Date  . Cardiac catheterization      s/p cardiac catheterization and bypass surgery as well as EGD with dilation x3, left lower extremity vein stripping  . Left knee anthroscopy    . Coronary artery bypass graft    . Secondary closure arm    . Esophagogastroduodenoscopy N/A 12/06/2013    Procedure: ESOPHAGOGASTRODUODENOSCOPY (EGD);  Surgeon: Lear Ng, MD;  Location: [facility];  Service: Endoscopy;  Laterality: N/A;   Current Outpatient Prescriptions on File Prior to Visit  Medication Sig Dispense Refill  . aspirin 325 MG tablet Take 325 mg by mouth daily.        . colchicine 0.6 MG tablet 2 at sign of gout flare then 1 one hour after initial administration. Then 1 twice daily until pain resolves.  60 tablet  0  .  lisinopril (PRINIVIL,ZESTRIL) 5 MG tablet Take 1 tablet (5 mg total) by mouth daily.  90 tablet  3  . metoprolol succinate (TOPROL-XL) 25 MG 24 hr tablet Take 1 tablet (25 mg total) by mouth daily.  30 tablet  6  . nitroGLYCERIN (NITROSTAT) 0.4 MG SL tablet Place 0.4 mg under the tongue every 5 (five) minutes as needed for chest pain.      Marland Kitchen omeprazole (PRILOSEC) 20 MG capsule TAKE ONE CAPSULE BY MOUTH EVERY MORNING  30 capsule  3  . pravastatin (PRAVACHOL) 20 MG tablet TAKE 1 TABLET EVERY DAY  30 tablet  4   No current facility-administered medications on file prior to visit.   Allergies  Allergen Reactions  . Celebrex [Celecoxib] Rash    blisters   History   Social History  . Marital Status: Married    Spouse Name: N/A    Number of Children: N/A  . Years of Education: N/A   Occupational History  . Not on file.   Social History Main Topics  . Smoking status: Current Every Day Smoker -- 1.00 packs/day    Types: Cigarettes  . Smokeless tobacco: Never Used     Comment: greater than 50 pack year history  . Alcohol Use: No  . Drug Use: No  . Sexual Activity: Not on file   Other Topics Concern  .  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       Review of Systems  All other systems reviewed and are negative.      Objective:   Physical Exam  Vitals reviewed. Cardiovascular: Normal rate, regular rhythm and normal heart sounds.   Pulmonary/Chest: Effort normal and breath sounds normal. No respiratory distress. He has no wheezes. He has no rales. He exhibits no tenderness.  Abdominal: Soft. Bowel sounds are normal. He exhibits no distension and no mass. There is tenderness. There is no rebound and no guarding.  Skin: Rash noted. There is erythema.   tender to palpation in the left upper quadrant and epigastric area. See the description of the  boil in the history of present illness        Assessment & Plan:  Allergic rhinitis, unspecified allergic rhinitis type - Plan: fluticasone (FLONASE) 50 MCG/ACT nasal spray  Cellulitis and abscess - Plan: doxycycline (VIBRA-TABS) 100 MG tablet  I believe the patient is dealing with allergic rhinitis. He can use Flonase 2 sprays each nostril daily for allergies. I believe the patient may have gastritis causing his abdominal discomfort. Discontinue omeprazole and replaced with dexilant 60 mg poqday.  Recheck next week. I anesthetized the abscess with 0.1% lidocaine with epinephrine. I made a 1.5 cm horizontal incision. I expressed purulent material. A wound culture was sent. The wound cavity was then cleaned with Q-tips soaked in hydrogen peroxide. The wound was packed with 6 inches of 1/4 inch iodoform gauze. Recheck on Monday.  Begin doxycycline 100 mg by mouth twice a day for 10 days.  Note: This dictation was prepared with Dragon dictation along with smart phrase technology. Any transcriptional errors that result from this process are unintentional.

## 2014-04-03 LAB — CULTURE, ROUTINE-ABSCESS: GRAM STAIN: NONE SEEN

## 2014-04-04 ENCOUNTER — Encounter: Payer: Self-pay | Admitting: Family Medicine

## 2014-04-04 ENCOUNTER — Ambulatory Visit (INDEPENDENT_AMBULATORY_CARE_PROVIDER_SITE_OTHER): Payer: Medicare HMO | Admitting: Family Medicine

## 2014-04-04 VITALS — BP 96/60 | HR 80 | Temp 98.6°F | Resp 18 | Ht 74.0 in | Wt 177.0 lb

## 2014-04-04 DIAGNOSIS — R091 Pleurisy: Secondary | ICD-10-CM

## 2014-04-04 MED ORDER — KETOROLAC TROMETHAMINE 30 MG/ML IJ SOLN
30.0000 mg | Freq: Once | INTRAMUSCULAR | Status: AC
  Administered 2014-04-04: 30 mg via INTRAMUSCULAR

## 2014-04-04 MED ORDER — KETOROLAC TROMETHAMINE 30 MG/ML IJ SOLN: 30.0000 mg | Freq: Once | INTRAMUSCULAR | Status: AC

## 2014-04-04 NOTE — Progress Notes (Signed)
Subjective:    Patient ID: [name], male    DOB: 1935/05/21, 79 y.o.   MRN: [id number]  HPI 04/01/14 Patient returns one week of rhinorrhea, sneezing, itchy watery eyes. He also reports sinus congestion.  Patient also reports epigastric discomfort. He complains of worsening indigestion. The epigastric discomfort radiates around his left upper quadrant into his back. It is exacerbated by food. He denies any chest pain or shortness of breath or dyspnea on exertion. The pain is unrelated to exercise.  The patient has a 4 cm x 3 cm erythematous boil on his right lower quadrant of his abdomen. It has been gradually enlarging for the last week.  The center is approximately 2 cm x 1 cm. It is oozing purulent material from multiple pinpoint holes.  At that time, my plan was:  I believe the patient is dealing with allergic rhinitis. He can use Flonase 2 sprays each nostril daily for allergies. I believe the patient may have gastritis causing his abdominal discomfort. Discontinue omeprazole and replaced with dexilant 60 mg poqday.  Recheck next week. I anesthetized the abscess with 0.1% lidocaine with epinephrine. I made a 1.5 cm horizontal incision. I expressed purulent material. A wound culture was sent. The wound cavity was then cleaned with Q-tips soaked in hydrogen peroxide. The wound was packed with 6 inches of 1/4 inch iodoform gauze. Recheck on Monday.  Begin doxycycline 100 mg by mouth twice a day for 10 days.  Note: This dictation was prepared with Dragon dictation along with smart phrase technology. Any transcriptional errors that result from this process are unintentional.    04/04/14 The abscess on the patient's lower abdomen appears much better. Erythema is receding. Stellate 2 cm x 1 cm opening. I was able to remove the remainder of the iodoform gauze from that opening. I was able to express today's purulent drainage. The patient's culture returned showing MRSA that was sensitive to  tetracycline. He slowly appears to be improving. His back pain is better. His rhinitis is better. His stomach pain is better. However he now is complaining of severe pain in his ribs particularly deep inspiration. He also reports pain with twisting and bending movements in his ribs and. He denies any falls or injuries. He has a significant past medical history of smoking Past Medical History  Diagnosis Date  . Atrial flutter     and SVT that is post ablation  . Acute venous embolism and thrombosis of unspecified deep vessels of lower extremity   . Dyslipidemia   . CAD (coronary artery disease)   . HTN (hypertension)   . Shortness of breath     on exertion  . Chronic kidney disease     stones  . GERD (gastroesophageal reflux disease)    Past Surgical History  Procedure Laterality Date  . Cardiac catheterization      s/p cardiac catheterization and bypass surgery as well as EGD with dilation x3, left lower extremity vein stripping  . Left knee anthroscopy    . Coronary artery bypass graft    . Secondary closure arm    . Esophagogastroduodenoscopy N/A 12/06/2013    Procedure: ESOPHAGOGASTRODUODENOSCOPY (EGD);  Surgeon: Lear Ng, MD;  Location: [facility];  Service: Endoscopy;  Laterality: N/A;   Current Outpatient Prescriptions on File Prior to Visit  Medication Sig Dispense Refill  . aspirin 325 MG tablet Take 325 mg by mouth daily.        . colchicine 0.6 MG tablet 2  at sign of gout flare then 1 one hour after initial administration. Then 1 twice daily until pain resolves.  60 tablet  0  . doxycycline (VIBRA-TABS) 100 MG tablet Take 1 tablet (100 mg total) by mouth 2 (two) times daily.  20 tablet  0  . fluticasone (FLONASE) 50 MCG/ACT nasal spray Place 2 sprays into both nostrils daily.  16 g  6  . lisinopril (PRINIVIL,ZESTRIL) 5 MG tablet Take 1 tablet (5 mg total) by mouth daily.  90 tablet  3  . metoprolol succinate (TOPROL-XL) 25 MG 24 hr tablet Take 1 tablet (25 mg  total) by mouth daily.  30 tablet  6  . nitroGLYCERIN (NITROSTAT) 0.4 MG SL tablet Place 0.4 mg under the tongue every 5 (five) minutes as needed for chest pain.      Marland Kitchen omeprazole (PRILOSEC) 20 MG capsule TAKE ONE CAPSULE BY MOUTH EVERY MORNING  30 capsule  3  . pravastatin (PRAVACHOL) 20 MG tablet TAKE 1 TABLET EVERY DAY  30 tablet  4   No current facility-administered medications on file prior to visit.   Allergies  Allergen Reactions  . Celebrex [Celecoxib] Rash    blisters   History   Social History  . Marital Status: Married    Spouse Name: N/A    Number of Children: N/A  . Years of Education: N/A   Occupational History  . Not on file.   Social History Main Topics  . Smoking status: Current Every Day Smoker -- 1.00 packs/day    Types: Cigarettes  . Smokeless tobacco: Never Used     Comment: greater than 50 pack year history  . Alcohol Use: No  . Drug Use: No  . Sexual Activity: Not on file   Other Topics Concern  . 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       Review of Systems  All other systems reviewed and are negative.      Objective:   Physical Exam  Vitals reviewed. Cardiovascular: Normal rate, regular rhythm and normal heart sounds.   Pulmonary/Chest: Effort normal and breath sounds normal. No respiratory distress. He has no wheezes. He has no rales. He exhibits no tenderness.  Abdominal: Soft. Bowel sounds are normal. He exhibits no distension and no mass. There is tenderness. There is no rebound and no guarding.  Skin: Rash noted. There is erythema.   erythema on the right lower abdomen appears much better. There is still a 2 cm x 1 cm opening. I removed the remainder of the iodoform gauze       Assessment & Plan:  Pleurisy - Plan: DG Chest 2 View, ketorolac (TORADOL) 30 MG/ML injection 30 mg, ketorolac  (TORADOL) 30 MG/ML injection 30 mg  given his smoking history, I like to send the patient for chest x-ray. Meanwhile treat the patient's proceed with Toradol 30 mg IM x1. I am obtaining a chest x-ray to rule out malignancies in the lung as well as pneumonia. However I believe this is likely musculoskeletal chest pain I believe the Toradol should alleviate his pain. The abscess on his lower abdomen it appears to be healing. I like to recheck this in one week or sooner if worse

## 2014-04-12 ENCOUNTER — Ambulatory Visit (INDEPENDENT_AMBULATORY_CARE_PROVIDER_SITE_OTHER): Payer: Medicare HMO | Admitting: Family Medicine

## 2014-04-12 ENCOUNTER — Encounter: Payer: Self-pay | Admitting: Family Medicine

## 2014-04-12 VITALS — BP 104/58 | HR 80 | Temp 97.5°F | Resp 20 | Ht 74.0 in | Wt 179.0 lb

## 2014-04-12 DIAGNOSIS — L98492 Non-pressure chronic ulcer of skin of other sites with fat layer exposed: Secondary | ICD-10-CM

## 2014-04-12 DIAGNOSIS — L259 Unspecified contact dermatitis, unspecified cause: Secondary | ICD-10-CM

## 2014-04-12 DIAGNOSIS — L98432 Non-pressure chronic ulcer of abdomen with fat layer exposed: Secondary | ICD-10-CM

## 2014-04-12 MED ORDER — PREDNISONE 20 MG PO TABS: ORAL_TABLET | ORAL | Status: DC

## 2014-04-12 NOTE — Progress Notes (Signed)
Subjective:    Patient ID: [name], male    DOB: 12-18-1934, 79 y.o.   MRN: [id number]  HPI I saw the patient on October 9. At that time he had a boil on his right lower abdomen. The central portion of the boil was necrotic and draining.  I performed an incision and drainage. Cultures returned positive for MRSA. The patient was treated with antibiotics. He is here today for recheck. The wound on his right lower abdomen does not appear any better. It is now a 3 cm x 1 cm opening that still has central necrosis with underlying exposure the subcutaneous fat. I am concerned this may be a neoplastic process that was secondarily infected. I believe he needs an excisional biopsy.  He also has a diffuse widespread rash on his arms and legs and back which is extremely pruritic. This began after he started the antibiotics. It is a diffuse papular squamous eruption consistent with some type of allergy. Past Medical History  Diagnosis Date  . Atrial flutter     and SVT that is post ablation  . Acute venous embolism and thrombosis of unspecified deep vessels of lower extremity   . Dyslipidemia   . CAD (coronary artery disease)   . HTN (hypertension)   . Shortness of breath     on exertion  . Chronic kidney disease     stones  . GERD (gastroesophageal reflux disease)    Past Surgical History  Procedure Laterality Date  . Cardiac catheterization      s/p cardiac catheterization and bypass surgery as well as EGD with dilation x3, left lower extremity vein stripping  . Left knee anthroscopy    . Coronary artery bypass graft    . Secondary closure arm    . Esophagogastroduodenoscopy N/A 12/06/2013    Procedure: ESOPHAGOGASTRODUODENOSCOPY (EGD);  Surgeon: Lear Ng, MD;  Location: [facility];  Service: Endoscopy;  Laterality: N/A;   Current Outpatient Prescriptions on File Prior to Visit  Medication Sig Dispense Refill  . aspirin 325 MG tablet Take 325 mg by mouth daily.          . colchicine 0.6 MG tablet 2 at sign of gout flare then 1 one hour after initial administration. Then 1 twice daily until pain resolves.  60 tablet  0  . fluticasone (FLONASE) 50 MCG/ACT nasal spray Place 2 sprays into both nostrils daily.  16 g  6  . lisinopril (PRINIVIL,ZESTRIL) 5 MG tablet Take 1 tablet (5 mg total) by mouth daily.  90 tablet  3  . metoprolol succinate (TOPROL-XL) 25 MG 24 hr tablet Take 1 tablet (25 mg total) by mouth daily.  30 tablet  6  . nitroGLYCERIN (NITROSTAT) 0.4 MG SL tablet Place 0.4 mg under the tongue every 5 (five) minutes as needed for chest pain.      Marland Kitchen omeprazole (PRILOSEC) 20 MG capsule TAKE ONE CAPSULE BY MOUTH EVERY MORNING  30 capsule  3  . pravastatin (PRAVACHOL) 20 MG tablet TAKE 1 TABLET EVERY DAY  30 tablet  4   No current facility-administered medications on file prior to visit.   Allergies  Allergen Reactions  . Celebrex [Celecoxib] Rash    blisters   History   Social History  . Marital Status: Married    Spouse Name: N/A    Number of Children: N/A  . Years of Education: N/A   Occupational History  . Not on file.   Social History Main Topics  .  Smoking status: Current Every Day Smoker -- 1.00 packs/day    Types: Cigarettes  . Smokeless tobacco: Never Used     Comment: greater than 50 pack year history  . Alcohol Use: No  . Drug Use: No  . Sexual Activity: Not on file   Other Topics Concern  . 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       Review of Systems  All other systems reviewed and are negative.      Objective:   Physical Exam  Vitals reviewed. Cardiovascular: Normal rate, regular rhythm and normal heart sounds.   Pulmonary/Chest: Effort normal and breath sounds normal.  Abdominal: Soft. Bowel sounds are normal. He exhibits no distension. There is no tenderness. There is no  rebound.  Skin: Rash noted. There is erythema.   please see the description of the rash in the history of present illness.  There is a nonhealing ulcer on the right lower quadrant of his abdomen with underlying subcutaneous fascia exposed.  There is no evidence of healthy granulation tissue.        Assessment & Plan:  Contact dermatitis - Plan: predniSONE (DELTASONE) 20 MG tablet  Ulcer of abdomen wall, with fat layer exposed - Plan: Ambulatory referral to Dermatology  With the patient has an allergic reaction to antibiotics causing diffuse widespread rash. I will treat him with a prednisone taper pack for the allergic reaction. However I am very concerned by the nonhealing ulcer on his right lower quadrant. These areas then the total of 3 weeks. It is not appearing any better. I am concerned there may be some type of neoplastic process causing the nonhealing ulcer. I believe it requires an excisional biopsy. Recommend an urgent referral to dermatology for this.

## 2014-05-16 ENCOUNTER — Other Ambulatory Visit: Payer: Self-pay | Admitting: Family Medicine

## 2014-05-16 ENCOUNTER — Ambulatory Visit
Admission: RE | Admit: 2014-05-16 | Discharge: 2014-05-16 | Disposition: A | Discharge status: Home / Self Care | Payer: Commercial Managed Care - HMO | Source: Ambulatory Visit | Attending: Family Medicine | Admitting: Family Medicine

## 2014-05-16 DIAGNOSIS — R091 Pleurisy: Secondary | ICD-10-CM

## 2014-07-07 ENCOUNTER — Other Ambulatory Visit: Payer: Self-pay | Admitting: Family Medicine

## 2014-07-07 NOTE — Telephone Encounter (Signed)
Medication refilled per protocol. 

## 2014-08-03 ENCOUNTER — Other Ambulatory Visit: Payer: Self-pay | Admitting: Family Medicine

## 2014-08-03 NOTE — Telephone Encounter (Signed)
Medication refilled per protocol. 

## 2014-08-17 ENCOUNTER — Other Ambulatory Visit: Payer: Self-pay | Admitting: Family Medicine

## 2014-08-17 ENCOUNTER — Other Ambulatory Visit: Payer: Self-pay

## 2014-08-17 MED ORDER — PRAVASTATIN SODIUM 20 MG PO TABS: 20.0000 mg | ORAL_TABLET | Freq: Every day | ORAL | Status: DC

## 2014-08-17 MED ORDER — METOPROLOL SUCCINATE ER 25 MG PO TB24: 25.0000 mg | ORAL_TABLET | Freq: Every day | ORAL | Status: DC

## 2014-08-17 MED ORDER — OMEPRAZOLE 20 MG PO CPDR: 20.0000 mg | DELAYED_RELEASE_CAPSULE | Freq: Every morning | ORAL | Status: DC

## 2014-08-17 NOTE — Telephone Encounter (Signed)
Med sent to pharm 

## 2014-08-19 ENCOUNTER — Other Ambulatory Visit: Payer: Self-pay | Admitting: *Deleted

## 2014-08-19 MED ORDER — METOPROLOL SUCCINATE ER 25 MG PO TB24: 25.0000 mg | ORAL_TABLET | Freq: Every day | ORAL | Status: DC

## 2014-08-25 ENCOUNTER — Other Ambulatory Visit: Payer: Self-pay

## 2014-08-25 MED ORDER — METOPROLOL SUCCINATE ER 25 MG PO TB24: 25.0000 mg | ORAL_TABLET | Freq: Every day | ORAL | Status: DC

## 2014-09-07 ENCOUNTER — Emergency Department (HOSPITAL_COMMUNITY)
Admission: EM | Admit: 2014-09-07 | Discharge: 2014-09-07 | Disposition: A | Discharge status: Home / Self Care | Payer: Medicare HMO | Attending: Emergency Medicine | Admitting: Emergency Medicine

## 2014-09-07 ENCOUNTER — Emergency Department (HOSPITAL_COMMUNITY): Payer: Medicare HMO

## 2014-09-07 ENCOUNTER — Encounter (HOSPITAL_COMMUNITY): Payer: Self-pay | Admitting: Family Medicine

## 2014-09-07 DIAGNOSIS — Z86718 Personal history of other venous thrombosis and embolism: Secondary | ICD-10-CM | POA: Diagnosis not present

## 2014-09-07 DIAGNOSIS — E785 Hyperlipidemia, unspecified: Secondary | ICD-10-CM | POA: Insufficient documentation

## 2014-09-07 DIAGNOSIS — Z79899 Other long term (current) drug therapy: Secondary | ICD-10-CM | POA: Diagnosis not present

## 2014-09-07 DIAGNOSIS — N289 Disorder of kidney and ureter, unspecified: Secondary | ICD-10-CM

## 2014-09-07 DIAGNOSIS — Z7951 Long term (current) use of inhaled steroids: Secondary | ICD-10-CM | POA: Diagnosis not present

## 2014-09-07 DIAGNOSIS — Z7982 Long term (current) use of aspirin: Secondary | ICD-10-CM | POA: Insufficient documentation

## 2014-09-07 DIAGNOSIS — N23 Unspecified renal colic: Secondary | ICD-10-CM | POA: Diagnosis not present

## 2014-09-07 DIAGNOSIS — Z720 Tobacco use: Secondary | ICD-10-CM | POA: Insufficient documentation

## 2014-09-07 DIAGNOSIS — Z9889 Other specified postprocedural states: Secondary | ICD-10-CM | POA: Diagnosis not present

## 2014-09-07 DIAGNOSIS — N200 Calculus of kidney: Secondary | ICD-10-CM | POA: Diagnosis not present

## 2014-09-07 DIAGNOSIS — Z951 Presence of aortocoronary bypass graft: Secondary | ICD-10-CM | POA: Insufficient documentation

## 2014-09-07 DIAGNOSIS — I129 Hypertensive chronic kidney disease with stage 1 through stage 4 chronic kidney disease, or unspecified chronic kidney disease: Secondary | ICD-10-CM | POA: Diagnosis not present

## 2014-09-07 DIAGNOSIS — I2510 Atherosclerotic heart disease of native coronary artery without angina pectoris: Secondary | ICD-10-CM | POA: Insufficient documentation

## 2014-09-07 DIAGNOSIS — R1032 Left lower quadrant pain: Secondary | ICD-10-CM | POA: Diagnosis present

## 2014-09-07 DIAGNOSIS — N189 Chronic kidney disease, unspecified: Secondary | ICD-10-CM | POA: Diagnosis not present

## 2014-09-07 DIAGNOSIS — K219 Gastro-esophageal reflux disease without esophagitis: Secondary | ICD-10-CM | POA: Diagnosis not present

## 2014-09-07 LAB — BASIC METABOLIC PANEL
ANION GAP: 8 (ref 5–15)
BUN: 20 mg/dL (ref 6–23)
CHLORIDE: 108 mmol/L (ref 96–112)
CO2: 23 mmol/L (ref 19–32)
Calcium: 8.8 mg/dL (ref 8.4–10.5)
Creatinine, Ser: 1.67 mg/dL — ABNORMAL HIGH (ref 0.50–1.35)
GFR calc Af Amer: 44 mL/min — ABNORMAL LOW (ref >90)
GFR calc non Af Amer: 38 mL/min — ABNORMAL LOW (ref >90)
Glucose, Bld: 138 mg/dL — ABNORMAL HIGH (ref 70–99)
Potassium: 4.3 mmol/L (ref 3.5–5.1)
SODIUM: 139 mmol/L (ref 135–145)

## 2014-09-07 LAB — CBC WITH DIFFERENTIAL/PLATELET
Basophils Absolute: 0 10*3/uL (ref 0.0–0.1)
Basophils Relative: 0 % (ref 0–1)
EOS ABS: 0 10*3/uL (ref 0.0–0.7)
Eosinophils Relative: 0 % (ref 0–5)
HCT: 43.4 % (ref 39.0–52.0)
HEMOGLOBIN: 14.2 g/dL (ref 13.0–17.0)
LYMPHS ABS: 1.3 10*3/uL (ref 0.7–4.0)
Lymphocytes Relative: 13 % (ref 12–46)
MCH: 29.2 pg (ref 26.0–34.0)
MCHC: 32.7 g/dL (ref 30.0–36.0)
MCV: 89.3 fL (ref 78.0–100.0)
MONOS PCT: 6 % (ref 3–12)
Monocytes Absolute: 0.6 10*3/uL (ref 0.1–1.0)
NEUTROS PCT: 81 % — AB (ref 43–77)
Neutro Abs: 7.7 10*3/uL (ref 1.7–7.7)
Platelets: 200 10*3/uL (ref 150–400)
RBC: 4.86 MIL/uL (ref 4.22–5.81)
RDW: 15.2 % (ref 11.5–15.5)
WBC: 9.6 10*3/uL (ref 4.0–10.5)

## 2014-09-07 LAB — URINE MICROSCOPIC-ADD ON

## 2014-09-07 LAB — URINALYSIS, ROUTINE W REFLEX MICROSCOPIC
Bilirubin Urine: NEGATIVE
Glucose, UA: NEGATIVE mg/dL
KETONES UR: NEGATIVE mg/dL
LEUKOCYTES UA: NEGATIVE
NITRITE: NEGATIVE
PH: 5.5 (ref 5.0–8.0)
Protein, ur: 30 mg/dL — AB
SPECIFIC GRAVITY, URINE: 1.022 (ref 1.005–1.030)
Urobilinogen, UA: 0.2 mg/dL (ref 0.0–1.0)

## 2014-09-07 MED ORDER — HYDROMORPHONE HCL 1 MG/ML IJ SOLN
1.0000 mg | Freq: Once | INTRAMUSCULAR | Status: AC
  Administered 2014-09-07: 1 mg via INTRAVENOUS
  Filled 2014-09-07: qty 1

## 2014-09-07 MED ORDER — HYDROCODONE-ACETAMINOPHEN 5-325 MG PO TABS: 1.0000 | ORAL_TABLET | Freq: Four times a day (QID) | ORAL | Status: DC | PRN

## 2014-09-07 MED ORDER — ONDANSETRON HCL 4 MG PO TABS: 4.0000 mg | ORAL_TABLET | Freq: Four times a day (QID) | ORAL | Status: DC

## 2014-09-07 MED ORDER — ONDANSETRON HCL 4 MG/2ML IJ SOLN
4.0000 mg | Freq: Once | INTRAMUSCULAR | Status: AC
  Administered 2014-09-07: 4 mg via INTRAVENOUS
  Filled 2014-09-07: qty 2

## 2014-09-07 MED ORDER — TAMSULOSIN HCL 0.4 MG PO CAPS
0.4000 mg | ORAL_CAPSULE | Freq: Every day | ORAL | Status: DC
Start: 2014-09-07 — End: 2015-02-07

## 2014-09-07 NOTE — ED Notes (Signed)
Pt having left flank and lower quadrant pain that started at 3 am with vomiting.

## 2014-09-07 NOTE — Discharge Instructions (Signed)

## 2014-09-07 NOTE — ED Provider Notes (Signed)
CSN: [id number]     Arrival date & time 09/07/14  1607 History   First MD Initiated Contact with Patient 09/07/14 [phone]     Chief Complaint  Patient presents with  . Flank Pain  . Abdominal Pain     Patient is a 80 y.o. male presenting with flank pain and abdominal pain. The history is provided by the patient and a relative. No language interpreter was used.  Flank Pain Associated symptoms include abdominal pain.  Abdominal Pain  Mr. Riendeau presents for evaluation of left flank pain. His pain started at approximately 3 AM and is described as a sharp pain that is constant in nature. There are no alleviating or worsening factors. The pain is on the left flank and wraps around to the left lower quadrant. He has associated vomiting. He denies any fevers, diarrhea, dysuria or hematuria. He had similar symptoms 6 months ago when he was diagnosed with kidney stones.   Past Medical History  Diagnosis Date  . Atrial flutter     and SVT that is post ablation  . Acute venous embolism and thrombosis of unspecified deep vessels of lower extremity   . Dyslipidemia   . CAD (coronary artery disease)   . HTN (hypertension)   . Shortness of breath     on exertion  . Chronic kidney disease     stones  . GERD (gastroesophageal reflux disease)    Past Surgical History  Procedure Laterality Date  . Cardiac catheterization      s/p cardiac catheterization and bypass surgery as well as EGD with dilation x3, left lower extremity vein stripping  . Left knee anthroscopy    . Coronary artery bypass graft    . Secondary closure arm    . Esophagogastroduodenoscopy N/A 12/06/2013    Procedure: ESOPHAGOGASTRODUODENOSCOPY (EGD);  Surgeon: Lear Ng, MD;  Location: [facility];  Service: Endoscopy;  Laterality: N/A;   History reviewed. No pertinent family history. History  Substance Use Topics  . Smoking status: Current Every Day Smoker -- 1.00 packs/day    Types: Cigarettes  . Smokeless  tobacco: Never Used     Comment: greater than 50 pack year history  . Alcohol Use: No    Review of Systems  Gastrointestinal: Positive for abdominal pain.  Genitourinary: Positive for flank pain.  All other systems reviewed and are negative.     Allergies  Celebrex  Home Medications   Prior to Admission medications   Medication Sig Start Date End Date Taking? Authorizing Provider  aspirin 325 MG tablet Take 325 mg by mouth daily.      Historical Provider, MD  colchicine 0.6 MG tablet 2 at sign of gout flare then 1 one hour after initial administration. Then 1 twice daily until pain resolves. 10/25/13   Lonie Peak Dixon, PA-C  fluticasone (FLONASE) 50 MCG/ACT nasal spray Place 2 sprays into both nostrils daily. 04/01/14   Susy Frizzle, MD  lisinopril (PRINIVIL,ZESTRIL) 5 MG tablet Take 1 tablet (5 mg total) by mouth daily. 11/10/13   Lelon Perla, MD  metoprolol succinate (TOPROL-XL) 25 MG 24 hr tablet Take 1 tablet (25 mg total) by mouth daily. 08/25/14   Lelon Perla, MD  nitroGLYCERIN (NITROSTAT) 0.4 MG SL tablet Place 0.4 mg under the tongue every 5 (five) minutes as needed for chest pain. 11/14/11   Lelon Perla, MD  omeprazole (PRILOSEC) 20 MG capsule Take 1 capsule (20 mg total) by mouth every morning. 08/17/14  Susy Frizzle, MD  pravastatin (PRAVACHOL) 20 MG tablet Take 1 tablet (20 mg total) by mouth daily. 08/17/14   Susy Frizzle, MD  predniSONE (DELTASONE) 20 MG tablet 3 tabs poqday 1-2, 2 tabs poqday 3-4, 1 tab poqday 5-6 04/12/14   Susy Frizzle, MD   BP 159/71 mmHg  Pulse 59  Temp(Src) 97.5 F (36.4 C) (Oral)  Resp 18  SpO2 99% Physical Exam  Constitutional: He is oriented to person, place, and time. He appears well-developed and well-nourished.  HENT:  Head: Normocephalic and atraumatic.  Cardiovascular: Normal rate and regular rhythm.   No murmur heard. Pulmonary/Chest: Effort normal and breath sounds normal. No respiratory distress.   Abdominal: Soft. There is no rebound and no guarding.  Left CVA and mild left lower quadrant tenderness without guarding or rebound.  Musculoskeletal: He exhibits no edema or tenderness.  Neurological: He is alert and oriented to person, place, and time.  Skin: Skin is warm and dry.  Psychiatric: He has a normal mood and affect. His behavior is normal.  Nursing note and vitals reviewed.   ED Course  Procedures (including critical care time) Labs Review Labs Reviewed  BASIC METABOLIC PANEL - Abnormal; Notable for the following:    Glucose, Bld 138 (*)    Creatinine, Ser 1.67 (*)    GFR calc non Af Amer 38 (*)    GFR calc Af Amer 44 (*)    All other components within normal limits  CBC WITH DIFFERENTIAL/PLATELET - Abnormal; Notable for the following:    Neutrophils Relative % 81 (*)    All other components within normal limits  URINALYSIS, ROUTINE W REFLEX MICROSCOPIC - Abnormal; Notable for the following:    Hgb urine dipstick LARGE (*)    Protein, ur 30 (*)    All other components within normal limits  URINE MICROSCOPIC-ADD ON - Abnormal; Notable for the following:    Casts HYALINE CASTS (*)    All other components within normal limits    Imaging Review Ct Renal Stone Study  09/07/2014   CLINICAL DATA:  80 year old male with abdominal pain radiating to the left side, flank and back. Initial encounter.  EXAM: CT ABDOMEN AND PELVIS WITHOUT CONTRAST  TECHNIQUE: Multidetector CT imaging of the abdomen and pelvis was performed following the standard protocol without IV contrast.  COMPARISON:  CT Abdomen and Pelvis 02/10/2013 and earlier.  FINDINGS: Mild motion artifact at the lung bases. Calcified coronary atherosclerosis. No pleural or pericardial effusion. Mild dependent atelectasis or scarring in the lower lobes.  No acute osseous abnormality identified.  Extensive Aortoiliac calcified atherosclerosis noted. No pelvic free fluid. Decompressed rectum. Moderate to severe diverticulosis  of the sigmoid colon. No definite active inflammation associated. The left colon and transverse colon have a more normal appearance. There is diverticulosis of the right colon, again no definite active inflammation. Normal appendix. No dilated small bowel. Negative noncontrast stomach. Negative duodenum except for multiple duodenum diverticula.  Negative non contrast liver, gallbladder, spleen, and adrenal glands. Fatty infiltration of the pancreas re- identified. No abdominal free fluid.  Stable right kidney in ureter, with multiple punctate intra renal calculi. Diminutive bladder. On the left there is increased perinephric stranding. Mild left hydronephrosis. Increased size of the left extra renal pelvis. There is a punctate calculus lodged at the ureteropelvic junction seen on series 4, image 46 and coronal image 43. Multiple intra renal calculi Re identified, the largest in the lower pole is 6 mm. Distal to the  left UPJ, the left ureter is normal to the bladder.  IMPRESSION: 1. Punctate obstructing calculus at the left UPJ. Mild to moderate left hydronephrosis and perinephric stranding. 2. Bilateral nephrolithiasis greater on the left. 3. Diverticulosis of the proximal and distal colon with no active diverticulitis. 4. Extensive aortoiliac calcified atherosclerosis.   Electronically Signed   By: Genevie Ann M.D.   On: 09/07/2014 09:44     EKG Interpretation None      MDM   Final diagnoses:  Renal colic on left side  Renal insufficiency    Patient here for evaluation of left flank pain. Pain was resolved in the emergency department after pain control. CT scan with obstructing left renal calculus. Discussed with urology regarding follow-up. There is no current evidence of urinary tract infection. Discussed with patient and family strict return precautions for evidence of developing infection or uncontrolled pain. Patient prescribed prescription for Norco, Zofran, Flomax. BMP with renal insufficiency  which has been present on prior labs.  Quintella Reichert, MD 09/07/14 [phone]

## 2014-11-24 ENCOUNTER — Ambulatory Visit (INDEPENDENT_AMBULATORY_CARE_PROVIDER_SITE_OTHER): Payer: Medicare HMO | Admitting: Family Medicine

## 2014-11-24 ENCOUNTER — Encounter: Payer: Self-pay | Admitting: Family Medicine

## 2014-11-24 VITALS — BP 104/62 | HR 68 | Temp 97.7°F | Resp 20 | Ht 74.0 in | Wt 182.0 lb

## 2014-11-24 DIAGNOSIS — N5201 Erectile dysfunction due to arterial insufficiency: Secondary | ICD-10-CM | POA: Diagnosis not present

## 2014-11-24 DIAGNOSIS — I8311 Varicose veins of right lower extremity with inflammation: Secondary | ICD-10-CM | POA: Diagnosis not present

## 2014-11-24 DIAGNOSIS — I872 Venous insufficiency (chronic) (peripheral): Secondary | ICD-10-CM

## 2014-11-24 MED ORDER — VARDENAFIL HCL 10 MG PO TBDP: 10.0000 mg | ORAL_TABLET | Freq: Every day | ORAL | Status: DC | PRN

## 2014-11-24 MED ORDER — FLUOCINONIDE-E 0.05 % EX CREA: 1.0000 "application " | TOPICAL_CREAM | Freq: Two times a day (BID) | CUTANEOUS | Status: DC

## 2014-11-24 MED ORDER — PANTOPRAZOLE SODIUM 40 MG PO TBEC: 40.0000 mg | DELAYED_RELEASE_TABLET | Freq: Every day | ORAL | Status: DC

## 2014-11-24 NOTE — Progress Notes (Signed)
Subjective:    Patient ID: [name], male    DOB: 06/19/1935, 80 y.o.   MRN: [id number]  HPI Patient has a history of chronic venous insufficiency and varicose veins on both legs. He has underwent vein stripping in his left leg.  On his shin and lateral calf on his right leg near areas of varicose veins is erythematous scaly skin. There are also occasional small petechiae. There is a bronze discoloration to the skin in this area consistent with chronic venous insufficiency. The patient has tried triamcinolone cream there was minimal relief. He is also asking for a cheaper alternative than Viagra. He no longer uses nitroglycerin and he knows not to combine nitroglycerin with ED medication Past Medical History  Diagnosis Date  . Atrial flutter     and SVT that is post ablation  . Acute venous embolism and thrombosis of unspecified deep vessels of lower extremity   . Dyslipidemia   . CAD (coronary artery disease)   . HTN (hypertension)   . Shortness of breath     on exertion  . Chronic kidney disease     stones  . GERD (gastroesophageal reflux disease)    Past Surgical History  Procedure Laterality Date  . Cardiac catheterization      s/p cardiac catheterization and bypass surgery as well as EGD with dilation x3, left lower extremity vein stripping  . Left knee anthroscopy    . Coronary artery bypass graft    . Secondary closure arm    . Esophagogastroduodenoscopy N/A 12/06/2013    Procedure: ESOPHAGOGASTRODUODENOSCOPY (EGD);  Surgeon: Lear Ng, MD;  Location: [facility];  Service: Endoscopy;  Laterality: N/A;   Current Outpatient Prescriptions on File Prior to Visit  Medication Sig Dispense Refill  . acetaminophen (TYLENOL) 500 MG tablet Take 500 mg by mouth every 4 (four) hours as needed for mild pain or moderate pain.    Marland Kitchen aspirin 325 MG tablet Take 325-650 mg by mouth daily.     . colchicine 0.6 MG tablet 2 at sign of gout flare then 1 one hour after initial  administration. Then 1 twice daily until pain resolves. 60 tablet 0  . hydroxypropyl methylcellulose / hypromellose (ISOPTO TEARS / GONIOVISC) 2.5 % ophthalmic solution Place 1 drop into both eyes as needed for dry eyes.    Marland Kitchen lisinopril (PRINIVIL,ZESTRIL) 5 MG tablet Take 1 tablet (5 mg total) by mouth daily. 90 tablet 3  . metoprolol succinate (TOPROL-XL) 25 MG 24 hr tablet Take 1 tablet (25 mg total) by mouth daily. 90 tablet 1  . nitroGLYCERIN (NITROSTAT) 0.4 MG SL tablet Place 0.4 mg under the tongue every 5 (five) minutes as needed for chest pain.    Marland Kitchen omeprazole (PRILOSEC) 20 MG capsule Take 1 capsule (20 mg total) by mouth every morning. 90 capsule 3  . pravastatin (PRAVACHOL) 20 MG tablet Take 1 tablet (20 mg total) by mouth daily. 90 tablet 1  . triamcinolone cream (KENALOG) 0.1 % Apply 1 application topically 2 (two) times daily.  3  . tamsulosin (FLOMAX) 0.4 MG CAPS capsule Take 1 capsule (0.4 mg total) by mouth daily after breakfast. 14 capsule 0   No current facility-administered medications on file prior to visit.   Allergies  Allergen Reactions  . Celebrex [Celecoxib] Rash    blisters   History   Social History  . Marital Status: Married    Spouse Name: N/A  . Number of Children: N/A  . Years of Education:  N/A   Occupational History  . Not on file.   Social History Main Topics  . Smoking status: Current Every Day Smoker -- 1.00 packs/day    Types: Cigarettes  . Smokeless tobacco: Never Used     Comment: greater than 50 pack year history  . Alcohol Use: No  . Drug Use: No  . Sexual Activity: Not on file   Other Topics Concern  . 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      Review of Systems  All other systems reviewed and are negative.      Objective:   Physical Exam  Constitutional: He appears well-developed and  well-nourished.  Cardiovascular: Normal rate, regular rhythm and normal heart sounds.   Pulmonary/Chest: Effort normal and breath sounds normal. No respiratory distress. He has no wheezes. He has no rales.  Skin: Rash noted. There is erythema.  Vitals reviewed.         Assessment & Plan:  Venous stasis dermatitis of right lower extremity - Plan: fluocinonide-emollient (LIDEX-E) 0.05 % cream  Erectile dysfunction due to arterial insufficiency  Lidex emollient cream applied twice a day for 1-2 weeks to treat the venous stasis dermatitis of his leg. I gave the patient a prescription for Staxyn 10 mg 1/2-1 pill per day as needed for ED.

## 2014-12-08 ENCOUNTER — Other Ambulatory Visit: Payer: Self-pay | Admitting: Physician Assistant

## 2014-12-08 NOTE — Telephone Encounter (Signed)
Refill appropriate and filled per protocol. 

## 2014-12-12 ENCOUNTER — Telehealth: Payer: Self-pay | Admitting: Family Medicine

## 2014-12-12 MED ORDER — INDOMETHACIN 50 MG PO CAPS: ORAL_CAPSULE | ORAL | Status: DC

## 2014-12-12 NOTE — Telephone Encounter (Signed)
Indomethacin 50 mg by mouth every 8 hours when necessary pain, 30 tablets

## 2014-12-12 NOTE — Telephone Encounter (Signed)
Rec'd. Note from pharmacy. Colchicine 0.6 mg tablets are too expensive.  #60 $180.80 with insurance.  Is there a less expensive alternative??

## 2014-12-12 NOTE — Telephone Encounter (Signed)
Med sent to pharm 

## 2014-12-13 ENCOUNTER — Other Ambulatory Visit: Payer: Self-pay | Admitting: Cardiology

## 2014-12-13 DIAGNOSIS — I6523 Occlusion and stenosis of bilateral carotid arteries: Secondary | ICD-10-CM

## 2014-12-20 ENCOUNTER — Ambulatory Visit (HOSPITAL_COMMUNITY): Payer: Medicare HMO | Attending: Internal Medicine

## 2014-12-20 DIAGNOSIS — I6523 Occlusion and stenosis of bilateral carotid arteries: Secondary | ICD-10-CM | POA: Insufficient documentation

## 2014-12-23 ENCOUNTER — Telehealth: Payer: Self-pay | Admitting: Cardiology

## 2014-12-23 ENCOUNTER — Other Ambulatory Visit: Payer: Self-pay

## 2014-12-23 DIAGNOSIS — I10 Essential (primary) hypertension: Secondary | ICD-10-CM

## 2014-12-23 MED ORDER — LISINOPRIL 5 MG PO TABS: 5.0000 mg | ORAL_TABLET | Freq: Every day | ORAL | Status: DC

## 2014-12-23 NOTE — Telephone Encounter (Signed)
°  1. Which medications need to be refilled? Lisinopril  2. Which pharmacy is medication to be sent to?[phone] 3. Do they need a 30 day or 90 day supply? 30 and refills  4. Would they like a call back once the medication has been sent to the pharmacy? yes

## 2014-12-23 NOTE — Telephone Encounter (Signed)
Done. Patient informed

## 2015-01-11 NOTE — Progress Notes (Signed)
HPI: FU coronary artery disease, status post coronary bypassing graft. This was performed in 1998. His last catheterization in October 2008 showed a patent LIMA to the LAD and a patent saphenous vein graft to the right coronary artery. However, he did have significant left main disease that bifurcated into the circumflex. He had a drug-eluting stent to his left main at that time. Abdominal ultrasound in August of 2011 showed no aneurysm. His last Myoview was performed in July 2015. At that time, his ejection fraction was 53% and there was normal perfusion. He also has a history of SVT and atrial flutter ablation. Carotid Dopplers in June 2016 showed a 60-79% bilateral stenosis. Followup recommended in six months. Patient had chest CT 7/15 and RUL nodule noted; fu recommended one year. Since I last saw him he has dyspnea on exertion but no orthopnea, PND, pedal edema or syncope. No exertional chest pain.  Current Outpatient Prescriptions  Medication Sig Dispense Refill  . aspirin 325 MG tablet Take 325-650 mg by mouth daily.     Marland Kitchen lisinopril (PRINIVIL,ZESTRIL) 5 MG tablet Take 1 tablet (5 mg total) by mouth daily. 30 tablet 6  . metoprolol succinate (TOPROL-XL) 25 MG 24 hr tablet Take 1 tablet (25 mg total) by mouth daily. 90 tablet 1  . nitroGLYCERIN (NITROSTAT) 0.4 MG SL tablet Place 0.4 mg under the tongue every 5 (five) minutes as needed for chest pain.    Marland Kitchen omeprazole (PRILOSEC) 20 MG capsule Take 1 capsule (20 mg total) by mouth every morning. 90 capsule 3  . pantoprazole (PROTONIX) 40 MG tablet Take 1 tablet (40 mg total) by mouth daily. 30 tablet 3  . pravastatin (PRAVACHOL) 20 MG tablet Take 1 tablet (20 mg total) by mouth daily. 90 tablet 1  . tamsulosin (FLOMAX) 0.4 MG CAPS capsule Take 1 capsule (0.4 mg total) by mouth daily after breakfast. 14 capsule 0   No current facility-administered medications for this visit.     Past Medical History  Diagnosis Date  . Atrial flutter       and SVT that is post ablation  . Acute venous embolism and thrombosis of unspecified deep vessels of lower extremity   . Dyslipidemia   . CAD (coronary artery disease)   . HTN (hypertension)   . Shortness of breath     on exertion  . Chronic kidney disease     stones  . GERD (gastroesophageal reflux disease)     Past Surgical History  Procedure Laterality Date  . Cardiac catheterization      s/p cardiac catheterization and bypass surgery as well as EGD with dilation x3, left lower extremity vein stripping  . Left knee anthroscopy    . Coronary artery bypass graft    . Secondary closure arm    . Esophagogastroduodenoscopy N/A 12/06/2013    Procedure: ESOPHAGOGASTRODUODENOSCOPY (EGD);  Surgeon: Lear Ng, MD;  Location: [facility];  Service: Endoscopy;  Laterality: N/A;    History   Social History  . Marital Status: Married    Spouse Name: N/A  . Number of Children: N/A  . Years of Education: N/A   Occupational History  . Not on file.   Social History Main Topics  . Smoking status: Current Every Day Smoker -- 1.00 packs/day    Types: Cigarettes  . Smokeless tobacco: Never Used     Comment: greater than 50 pack year history  . Alcohol Use: No  . Drug Use: No  .  Sexual Activity: Not on file   Other Topics Concern  . 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     ROS: no fevers or chills, productive cough, hemoptysis, dysphasia, odynophagia, melena, hematochezia, dysuria, hematuria, rash, seizure activity, orthopnea, PND, pedal edema, claudication. Remaining systems are negative.  Physical Exam: Well-developed well-nourished in no acute distress.  Skin is warm and dry.  HEENT is normal.  Neck is supple.  Chest mildly diminished breath sounds throughout Cardiovascular exam is regular rate and rhythm.  Abdominal exam  nontender or distended. No masses palpated. Extremities show no edema. neuro grossly intact  ECG sinus rhythm at a rate of 58. RV conduction delay.

## 2015-01-13 ENCOUNTER — Encounter: Payer: Self-pay | Admitting: Cardiology

## 2015-01-13 ENCOUNTER — Ambulatory Visit (INDEPENDENT_AMBULATORY_CARE_PROVIDER_SITE_OTHER): Payer: Medicare HMO | Admitting: Cardiology

## 2015-01-13 DIAGNOSIS — I4892 Unspecified atrial flutter: Secondary | ICD-10-CM

## 2015-01-13 DIAGNOSIS — R911 Solitary pulmonary nodule: Secondary | ICD-10-CM | POA: Diagnosis not present

## 2015-01-13 DIAGNOSIS — E785 Hyperlipidemia, unspecified: Secondary | ICD-10-CM | POA: Diagnosis not present

## 2015-01-13 MED ORDER — PRAVASTATIN SODIUM 40 MG PO TABS: 40.0000 mg | ORAL_TABLET | Freq: Every day | ORAL | Status: DC

## 2015-01-13 NOTE — Assessment & Plan Note (Signed)
Status post ablation. 

## 2015-01-13 NOTE — Assessment & Plan Note (Signed)
Continue aspirin and statin. 

## 2015-01-13 NOTE — Assessment & Plan Note (Signed)
Patient has not tolerated high-dose statins previously. I will increase Pravachol to 40 mg daily to see if he tolerates. Check lipids and liver in 4 weeks.

## 2015-01-13 NOTE — Assessment & Plan Note (Signed)
Blood pressure controlled. Continue present medications. Check potassium and renal function. 

## 2015-01-13 NOTE — Patient Instructions (Signed)
Your physician wants you to follow-up in: [place] will receive a reminder letter in the mail two months in advance. If you don't receive a letter, please call our office to schedule the follow-up appointment.   CT OF THE CHEST W/O CONTRAST TO F/U LUNG NODULE  INCREASE PRAVASTATIN TO 40 MG ONCE DAILY= 2 OF THE 20 MG TABLETS ONCE DAILY  Your physician recommends that you return for lab work in: 4 WEEKS=DO NOT EAT PRIOR TO LAB WORK

## 2015-01-13 NOTE — Assessment & Plan Note (Signed)
Patient counseled on discontinuing. 

## 2015-01-13 NOTE — Assessment & Plan Note (Signed)
Schedule followup chest CT. 

## 2015-01-13 NOTE — Assessment & Plan Note (Signed)
Continue aspirin and statin. Follow-up carotid Dopplers December 2016. 

## 2015-01-20 ENCOUNTER — Ambulatory Visit
Admission: RE | Admit: 2015-01-20 | Discharge: 2015-01-20 | Disposition: A | Discharge status: Home / Self Care | Payer: Medicare HMO | Source: Ambulatory Visit | Attending: Cardiology | Admitting: Cardiology

## 2015-01-20 DIAGNOSIS — R911 Solitary pulmonary nodule: Secondary | ICD-10-CM

## 2015-02-03 ENCOUNTER — Other Ambulatory Visit: Payer: Medicare HMO

## 2015-02-03 LAB — HEPATIC FUNCTION PANEL
ALT: 9 U/L (ref 9–46)
AST: 13 U/L (ref 10–35)
Albumin: 3.9 g/dL (ref 3.6–5.1)
Alkaline Phosphatase: 61 U/L (ref 40–115)
BILIRUBIN DIRECT: 0.2 mg/dL (ref <=0.2)
BILIRUBIN TOTAL: 0.7 mg/dL (ref 0.2–1.2)
Indirect Bilirubin: 0.5 mg/dL (ref 0.2–1.2)
Total Protein: 7.4 g/dL (ref 6.1–8.1)

## 2015-02-03 LAB — LIPID PANEL
Cholesterol: 131 mg/dL (ref 125–200)
HDL: 28 mg/dL — ABNORMAL LOW (ref >=40)
LDL Cholesterol: 81 mg/dL (ref <130)
TRIGLYCERIDES: 108 mg/dL (ref <150)
Total CHOL/HDL Ratio: 4.7 Ratio (ref <=5.0)
VLDL: 22 mg/dL (ref <30)

## 2015-02-03 LAB — BASIC METABOLIC PANEL WITH GFR
BUN: 16 mg/dL (ref 7–25)
CALCIUM: 9.1 mg/dL (ref 8.6–10.3)
CO2: 28 mmol/L (ref 20–31)
CREATININE: 1.18 mg/dL (ref 0.70–1.18)
Chloride: 106 mmol/L (ref 98–110)
GFR, EST AFRICAN AMERICAN: 68 mL/min (ref >=60)
GFR, Est Non African American: 59 mL/min — ABNORMAL LOW (ref >=60)
Glucose, Bld: 116 mg/dL — ABNORMAL HIGH (ref 65–99)
Potassium: 5.2 mmol/L (ref 3.5–5.3)
SODIUM: 140 mmol/L (ref 135–146)

## 2015-02-07 ENCOUNTER — Encounter: Payer: Self-pay | Admitting: Family Medicine

## 2015-02-07 ENCOUNTER — Ambulatory Visit (INDEPENDENT_AMBULATORY_CARE_PROVIDER_SITE_OTHER): Payer: Medicare HMO | Admitting: Family Medicine

## 2015-02-07 VITALS — BP 110/60 | HR 88 | Temp 97.6°F | Resp 20 | Ht 74.0 in | Wt 183.0 lb

## 2015-02-07 DIAGNOSIS — L304 Erythema intertrigo: Secondary | ICD-10-CM

## 2015-02-07 DIAGNOSIS — H00016 Hordeolum externum left eye, unspecified eyelid: Secondary | ICD-10-CM

## 2015-02-07 MED ORDER — DOXYCYCLINE HYCLATE 100 MG PO TABS: 100.0000 mg | ORAL_TABLET | Freq: Two times a day (BID) | ORAL | Status: DC

## 2015-02-07 MED ORDER — TAMSULOSIN HCL 0.4 MG PO CAPS: 0.4000 mg | ORAL_CAPSULE | Freq: Every day | ORAL | Status: DC

## 2015-02-07 MED ORDER — ERYTHROMYCIN 5 MG/GM OP OINT: 1.0000 "application " | TOPICAL_OINTMENT | Freq: Every day | OPHTHALMIC | Status: DC

## 2015-02-07 MED ORDER — CLOTRIMAZOLE-BETAMETHASONE 1-0.05 % EX CREA: 1.0000 "application " | TOPICAL_CREAM | Freq: Two times a day (BID) | CUTANEOUS | Status: DC

## 2015-02-07 NOTE — Addendum Note (Signed)
Addended by: Shary Decamp B on: 02/07/2015 12:07 PM   Modules accepted: Orders

## 2015-02-07 NOTE — Progress Notes (Signed)
Subjective:    Patient ID: [name], male    DOB: August 01, 1934, 80 y.o.   MRN: [id number]  HPI Patient has a 5 mm large stye/boil forming on his left lower eyelid at the corner of his eye. Using a subcutaneous needle I lanced the boil superficially and expressed purulent material. Patient states that his been growing for the last 2 days there. There is no evidence of surrounding cellulitis. He also has an intertriginous rash in his groin due to sweat and yeast. There is erythema on the creases of his skin between his thigh and his scrotum bilaterally. Past Medical History  Diagnosis Date  . Atrial flutter     and SVT that is post ablation  . Acute venous embolism and thrombosis of unspecified deep vessels of lower extremity   . Dyslipidemia   . CAD (coronary artery disease)   . HTN (hypertension)   . Shortness of breath     on exertion  . Chronic kidney disease     stones  . GERD (gastroesophageal reflux disease)    Past Surgical History  Procedure Laterality Date  . Cardiac catheterization      s/p cardiac catheterization and bypass surgery as well as EGD with dilation x3, left lower extremity vein stripping  . Left knee anthroscopy    . Coronary artery bypass graft    . Secondary closure arm    . Esophagogastroduodenoscopy N/A 12/06/2013    Procedure: ESOPHAGOGASTRODUODENOSCOPY (EGD);  Surgeon: Lear Ng, MD;  Location: [facility];  Service: Endoscopy;  Laterality: N/A;   Current Outpatient Prescriptions on File Prior to Visit  Medication Sig Dispense Refill  . aspirin 325 MG tablet Take 325-650 mg by mouth daily.     Marland Kitchen lisinopril (PRINIVIL,ZESTRIL) 5 MG tablet Take 1 tablet (5 mg total) by mouth daily. 30 tablet 6  . metoprolol succinate (TOPROL-XL) 25 MG 24 hr tablet Take 1 tablet (25 mg total) by mouth daily. 90 tablet 1  . nitroGLYCERIN (NITROSTAT) 0.4 MG SL tablet Place 0.4 mg under the tongue every 5 (five) minutes as needed for chest pain.    Marland Kitchen  omeprazole (PRILOSEC) 20 MG capsule Take 1 capsule (20 mg total) by mouth every morning. 90 capsule 3  . pantoprazole (PROTONIX) 40 MG tablet Take 1 tablet (40 mg total) by mouth daily. 30 tablet 3  . pravastatin (PRAVACHOL) 40 MG tablet Take 1 tablet (40 mg total) by mouth daily. 90 tablet 3   No current facility-administered medications on file prior to visit.   Allergies  Allergen Reactions  . Celebrex [Celecoxib] Rash    blisters   Social History   Social History  . Marital Status: Married    Spouse Name: N/A  . Number of Children: N/A  . Years of Education: N/A   Occupational History  . Not on file.   Social History Main Topics  . Smoking status: Current Every Day Smoker -- 1.00 packs/day    Types: Cigarettes  . Smokeless tobacco: Never Used     Comment: greater than 50 pack year history  . Alcohol Use: No  . Drug Use: No  . Sexual Activity: Not on file   Other Topics Concern  . 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        Review of Systems  All other  systems reviewed and are negative.      Objective:   Physical Exam  Eyes: EOM are normal. Pupils are equal, round, and reactive to light. Left eye exhibits hordeolum. Left conjunctiva is not injected.  Cardiovascular: Normal rate, regular rhythm and normal heart sounds.   Pulmonary/Chest: Effort normal and breath sounds normal.  Skin: Rash noted. There is erythema.  Vitals reviewed.         Assessment & Plan:  Hordeolum externum, left - Plan: doxycycline (VIBRA-TABS) 100 MG tablet, erythromycin ophthalmic ointment  Intertrigo - Plan: clotrimazole-betamethasone (LOTRISONE) cream  I lanced the stye using a SQ needle and will start the patient on doxycycline 100 mg by mouth twice a day given the severity as I believe this is a small abscess and also start him on  erythromycin ointment daily both for the antibiotic effects as well as as a soothing gel to place in the eye given the pain he is experiencing from the boil.  Use warm compresses 3-4 times a day. I will treat the intertrigo in his private area with Lotrisone cream twice a day.

## 2015-03-27 ENCOUNTER — Other Ambulatory Visit: Payer: Self-pay | Admitting: Family Medicine

## 2015-03-31 DIAGNOSIS — H524 Presbyopia: Secondary | ICD-10-CM | POA: Diagnosis not present

## 2015-03-31 DIAGNOSIS — H2513 Age-related nuclear cataract, bilateral: Secondary | ICD-10-CM | POA: Diagnosis not present

## 2015-04-24 ENCOUNTER — Emergency Department (HOSPITAL_COMMUNITY): Payer: Medicare HMO

## 2015-04-24 ENCOUNTER — Encounter (HOSPITAL_COMMUNITY): Payer: Self-pay | Admitting: Emergency Medicine

## 2015-04-24 ENCOUNTER — Emergency Department (HOSPITAL_COMMUNITY)
Admission: EM | Admit: 2015-04-24 | Discharge: 2015-04-24 | Disposition: A | Discharge status: Home / Self Care | Payer: Medicare HMO | Attending: Emergency Medicine | Admitting: Emergency Medicine

## 2015-04-24 DIAGNOSIS — I2510 Atherosclerotic heart disease of native coronary artery without angina pectoris: Secondary | ICD-10-CM | POA: Insufficient documentation

## 2015-04-24 DIAGNOSIS — N189 Chronic kidney disease, unspecified: Secondary | ICD-10-CM | POA: Insufficient documentation

## 2015-04-24 DIAGNOSIS — Y9289 Other specified places as the place of occurrence of the external cause: Secondary | ICD-10-CM | POA: Diagnosis not present

## 2015-04-24 DIAGNOSIS — Z79899 Other long term (current) drug therapy: Secondary | ICD-10-CM | POA: Insufficient documentation

## 2015-04-24 DIAGNOSIS — Z23 Encounter for immunization: Secondary | ICD-10-CM | POA: Diagnosis not present

## 2015-04-24 DIAGNOSIS — Z9889 Other specified postprocedural states: Secondary | ICD-10-CM | POA: Insufficient documentation

## 2015-04-24 DIAGNOSIS — S99911A Unspecified injury of right ankle, initial encounter: Secondary | ICD-10-CM | POA: Diagnosis present

## 2015-04-24 DIAGNOSIS — K219 Gastro-esophageal reflux disease without esophagitis: Secondary | ICD-10-CM | POA: Diagnosis not present

## 2015-04-24 DIAGNOSIS — Z7982 Long term (current) use of aspirin: Secondary | ICD-10-CM | POA: Diagnosis not present

## 2015-04-24 DIAGNOSIS — S8261XA Displaced fracture of lateral malleolus of right fibula, initial encounter for closed fracture: Secondary | ICD-10-CM

## 2015-04-24 DIAGNOSIS — Y998 Other external cause status: Secondary | ICD-10-CM | POA: Diagnosis not present

## 2015-04-24 DIAGNOSIS — Z792 Long term (current) use of antibiotics: Secondary | ICD-10-CM | POA: Diagnosis not present

## 2015-04-24 DIAGNOSIS — E785 Hyperlipidemia, unspecified: Secondary | ICD-10-CM | POA: Insufficient documentation

## 2015-04-24 DIAGNOSIS — Z720 Tobacco use: Secondary | ICD-10-CM | POA: Insufficient documentation

## 2015-04-24 DIAGNOSIS — W208XXA Other cause of strike by thrown, projected or falling object, initial encounter: Secondary | ICD-10-CM | POA: Insufficient documentation

## 2015-04-24 DIAGNOSIS — I129 Hypertensive chronic kidney disease with stage 1 through stage 4 chronic kidney disease, or unspecified chronic kidney disease: Secondary | ICD-10-CM | POA: Diagnosis not present

## 2015-04-24 DIAGNOSIS — Z951 Presence of aortocoronary bypass graft: Secondary | ICD-10-CM | POA: Diagnosis not present

## 2015-04-24 DIAGNOSIS — Y9389 Activity, other specified: Secondary | ICD-10-CM | POA: Insufficient documentation

## 2015-04-24 DIAGNOSIS — IMO0002 Reserved for concepts with insufficient information to code with codable children: Secondary | ICD-10-CM

## 2015-04-24 DIAGNOSIS — S91011A Laceration without foreign body, right ankle, initial encounter: Secondary | ICD-10-CM | POA: Diagnosis not present

## 2015-04-24 MED ORDER — MORPHINE SULFATE (PF) 4 MG/ML IV SOLN
4.0000 mg | Freq: Once | INTRAVENOUS | Status: AC
  Administered 2015-04-24: 4 mg via INTRAVENOUS
  Filled 2015-04-24: qty 1

## 2015-04-24 MED ORDER — CEPHALEXIN 500 MG PO CAPS: 500.0000 mg | ORAL_CAPSULE | Freq: Four times a day (QID) | ORAL | Status: DC

## 2015-04-24 MED ORDER — CEFAZOLIN SODIUM 1-5 GM-% IV SOLN
1.0000 g | Freq: Once | INTRAVENOUS | Status: AC
  Administered 2015-04-24: 1 g via INTRAVENOUS
  Filled 2015-04-24: qty 50

## 2015-04-24 MED ORDER — OXYCODONE-ACETAMINOPHEN 5-325 MG PO TABS: 2.0000 | ORAL_TABLET | ORAL | Status: DC | PRN

## 2015-04-24 MED ORDER — TETANUS-DIPHTH-ACELL PERTUSSIS 5-2.5-18.5 LF-MCG/0.5 IM SUSP
0.5000 mL | Freq: Once | INTRAMUSCULAR | Status: AC
  Administered 2015-04-24: 0.5 mL via INTRAMUSCULAR
  Filled 2015-04-24: qty 0.5

## 2015-04-24 MED ORDER — LIDOCAINE-EPINEPHRINE (PF) 2 %-1:200000 IJ SOLN
20.0000 mL | Freq: Once | INTRAMUSCULAR | Status: AC
  Administered 2015-04-24: 20 mL
  Filled 2015-04-24: qty 20

## 2015-04-24 NOTE — ED Notes (Signed)
Pt from home for eval of laceration to right leg, pt states was loading a tractor when a bucket filled with dirt fell on leg. Pt denies taking any blood thinners. No tendon or bone exposed only fatty tissue. Pulses present, able to move toes. States only takes a baby aspirin. nad noted.

## 2015-04-24 NOTE — Progress Notes (Signed)
Orthopedic Tech Progress Note Patient Details:  [name] March 05, 1935 [id number]  Ortho Devices Type of Ortho Device: CAM walker, Crutches Ortho Device/Splint Interventions: Application   [name] 04/24/2015, 6:22 PM

## 2015-04-24 NOTE — ED Provider Notes (Signed)
CSN: [id number]     Arrival date & time 04/24/15  1214 History   First MD Initiated Contact with Patient 04/24/15 1416     Chief Complaint  Patient presents with  . Extremity Laceration     (Consider location/radiation/quality/duration/timing/severity/associated sxs/prior Treatment) HPI  [name] is a 80 y.o M who presents to the ED c/o RLE laceration. Pt states that around 11am this morning a 300lb loader bucket fell on his right ankle. Pt ambulatory at the scene. Pt drove himself to his PCP office for evaluation and was sent home without intervention. Pt family decided to bring him to the ED for second opinion. Denies numbness, tingling, discoloration of extremity.    Past Medical History  Diagnosis Date  . Atrial flutter (HCC)     and SVT that is post ablation  . Acute venous embolism and thrombosis of unspecified deep vessels of lower extremity   . Dyslipidemia   . CAD (coronary artery disease)   . HTN (hypertension)   . Shortness of breath     on exertion  . Chronic kidney disease     stones  . GERD (gastroesophageal reflux disease)    Past Surgical History  Procedure Laterality Date  . Cardiac catheterization      s/p cardiac catheterization and bypass surgery as well as EGD with dilation x3, left lower extremity vein stripping  . Left knee anthroscopy    . Coronary artery bypass graft    . Secondary closure arm    . Esophagogastroduodenoscopy N/A 12/06/2013    Procedure: ESOPHAGOGASTRODUODENOSCOPY (EGD);  Surgeon: Lear Ng, MD;  Location: [facility];  Service: Endoscopy;  Laterality: N/A;   No family history on file. Social History  Substance Use Topics  . Smoking status: Current Every Day Smoker -- 1.00 packs/day    Types: Cigarettes  . Smokeless tobacco: Never Used     Comment: greater than 50 pack year history  . Alcohol Use: No    Review of Systems  All other systems reviewed and are negative.     Allergies  Celebrex  Home  Medications   Prior to Admission medications   Medication Sig Start Date End Date Taking? Authorizing Provider  aspirin 325 MG tablet Take 325-650 mg by mouth daily.     Historical Provider, MD  clotrimazole-betamethasone (LOTRISONE) cream Apply 1 application topically 2 (two) times daily. 02/07/15   Susy Frizzle, MD  doxycycline (VIBRA-TABS) 100 MG tablet Take 1 tablet (100 mg total) by mouth 2 (two) times daily. 02/07/15   Susy Frizzle, MD  erythromycin ophthalmic ointment Place 1 application into the left eye at bedtime. 02/07/15   Susy Frizzle, MD  lisinopril (PRINIVIL,ZESTRIL) 5 MG tablet Take 1 tablet (5 mg total) by mouth daily. 12/23/14   Lelon Perla, MD  metoprolol succinate (TOPROL-XL) 25 MG 24 hr tablet Take 1 tablet (25 mg total) by mouth daily. 08/25/14   Lelon Perla, MD  nitroGLYCERIN (NITROSTAT) 0.4 MG SL tablet Place 0.4 mg under the tongue every 5 (five) minutes as needed for chest pain. 11/14/11   Lelon Perla, MD  omeprazole (PRILOSEC) 20 MG capsule Take 1 capsule (20 mg total) by mouth every morning. 08/17/14   Susy Frizzle, MD  pantoprazole (PROTONIX) 40 MG tablet TAKE 1 TABLET (40 MG TOTAL) BY MOUTH DAILY. 03/27/15   Susy Frizzle, MD  pravastatin (PRAVACHOL) 40 MG tablet Take 1 tablet (40 mg total) by mouth daily. 01/13/15   Aaron Edelman  Jacalyn Lefevre, MD  tamsulosin (FLOMAX) 0.4 MG CAPS capsule Take 1 capsule (0.4 mg total) by mouth daily after breakfast. 02/07/15   Susy Frizzle, MD   BP 150/61 mmHg  Pulse 60  Temp(Src) 97.5 F (36.4 C) (Oral)  Resp 16  Ht '6\' 2"'$  (1.88 m)  Wt 185 lb (83.915 kg)  BMI 23.74 kg/m2  SpO2 100% Physical Exam  Constitutional: He is oriented to person, place, and time. He appears well-developed and well-nourished.  HENT:  Head: Normocephalic and atraumatic.  Eyes: Conjunctivae are normal. Right eye exhibits no discharge. Left eye exhibits no discharge. No scleral icterus.  Cardiovascular: Normal rate.   Pulmonary/Chest:  Effort normal.  Musculoskeletal:  3.5 cm horizontal laceration to anterior ankle across the talus. Fatty tissue is exposed. No decrease ROM. Significant edema over the lateral malleolus with associated ecchymosis.   Neurological: He is alert and oriented to person, place, and time. No cranial nerve deficit. Coordination normal.  Strength 5/5 throughout. No sensory deficits.    Skin: Skin is warm and dry. No rash noted. No erythema. No pallor.  Psychiatric: He has a normal mood and affect. His behavior is normal.  Nursing note and vitals reviewed.   ED Course  Procedures (including critical care time)  LACERATION REPAIR Performed by: Carlos Levering Authorized by: Carlos Levering Consent: Verbal consent obtained. Risks and benefits: risks, benefits and alternatives were discussed Consent given by: patient Patient identity confirmed: provided demographic data Prepped and Draped in normal sterile fashion Wound explored  Laceration Location: ankle  Laceration Length: 3.5 cm  No Foreign Bodies seen or palpated  Anesthesia: local infiltration  Local anesthetic: lidocaine 2% with epinephrine  Anesthetic total: 10 ml  Irrigation method: syringe Amount of cleaning: standard  Skin closure: yes  Number of sutures: 8  Technique: simple interrupted  Patient tolerance: Patient tolerated the procedure well with no immediate complications.  Labs Review Labs Reviewed - No data to display  Imaging Review Dg Ankle Complete Right  04/24/2015  CLINICAL DATA:  Right lower leg laceration EXAM: RIGHT ANKLE - COMPLETE 3+ VIEW COMPARISON:  None. FINDINGS: Small minimally displaced fracture at the distal tip of the lateral malleolus and adjacent talus with overlying soft tissue swelling. No other fracture or dislocation. The ankle mortise is intact. Soft tissues are unremarkable. IMPRESSION: Small fracture at the distal tip of the lateral malleolus and adjacent talus with  overlying soft tissue swelling. Electronically Signed   By: Kathreen Devoid   On: 04/24/2015 15:19   I have personally reviewed and evaluated these images and lab results as part of my medical decision-making.   EKG Interpretation None      MDM   Final diagnoses:  Laceration  Closed low lateral malleolus fracture, right, initial encounter    80 y.o M presents with RLE injury. 300lb loader bucket fell on pt ankle. 3.5 cm horizontal laceration to anterior ankle, across talus. Fatty tissue exposed. Neurovascularly intact. Xray reveals small fracture at the distal tip of the lateral malleolus and adjacent talus.  Laceration is in a different location than the fracture. No bony exposure. Will not treat as an open fracture. Tetanus up dated. 1g ancef given. Laceration repaired with simple interrupted 3-0 prolene sutures. Pt placed in post op boot. Recommend follow up with ortho. Sutures to be removed in 8-10 days. Will d/c home with keflex and pain medications. RICE precautions recommended. Discussed treatment plan with pt and family who are agreeable. Return precautions outlined in  patient discharge instructions.   Patient was discussed with and seen by Dr. Betsey Holiday who agrees with the treatment plan.   VSS. Pt in NAD. Stable for discharge.      Carlos Levering, PA-C 04/24/15 [place], PA-C 04/25/15 0750  Orpah Greek, MD 04/27/15 [phone]

## 2015-04-24 NOTE — ED Provider Notes (Signed)
Patient presented to the ER with injury to right leg. Patient was loading a tractor and the pocket of the tractor fell and hit his right leg. Patient complaining of pain and laceration of the anterior lower leg.  Face to face Exam: HEENT - PERRLA Lungs - CTAB Heart - RRR, no M/R/G Abd - S/NT/ND Neuro - alert, oriented x3 Musculoskeletal - laceration anterior distal lower leg on the right without any obvious deformity  Plan: X-ray to rule out fracture and foreign body. Skin repair with sutures.  Orpah Greek, MD 04/24/15 [phone]

## 2015-04-24 NOTE — Discharge Instructions (Signed)
Ankle Fracture A fracture is a break in a bone. The ankle joint is made up of three bones. These include the lower (distal)sections of your lower leg bones, called the tibia and fibula, along with a bone in your foot, called the talus. Depending on how bad the break is and if more than one ankle joint bone is broken, a cast or splint is used to protect and keep your injured bone from moving while it heals. Sometimes, surgery is required to help the fracture heal properly.  There are two general types of fractures:  Stable fracture. This includes a single fracture line through one bone, with no injury to ankle ligaments. A fracture of the talus that does not have any displacement (movement of the bone on either side of the fracture line) is also stable.  Unstable fracture. This includes more than one fracture line through one or more bones in the ankle joint. It also includes fractures that have displacement of the bone on either side of the fracture line. CAUSES  A direct blow to the ankle.   Quickly and severely twisting your ankle.  Trauma, such as a car accident or falling from a significant height. RISK FACTORS You may be at a higher risk of ankle fracture if:  You have certain medical conditions.  You are involved in high-impact sports.  You are involved in a high-impact car accident. SIGNS AND SYMPTOMS   Tender and swollen ankle.  Bruising around the injured ankle.  Pain on movement of the ankle.  Difficulty walking or putting weight on the ankle.  A cold foot below the site of the ankle injury. This can occur if the blood vessels passing through your injured ankle were also damaged.  Numbness in the foot below the site of the ankle injury. DIAGNOSIS  An ankle fracture is usually diagnosed with a physical exam and X-rays. A CT scan may also be required for complex fractures. TREATMENT  Stable fractures are treated with a cast or splint and using crutches to avoid putting  weight on your injured ankle. This is followed by an ankle strengthening program. Some patients require a special type of cast, depending on other medical problems they may have. Unstable fractures require surgery to ensure the bones heal properly. Your health care provider will tell you what type of fracture you have and the best treatment for your condition. HOME CARE INSTRUCTIONS   Review correct crutch use with your health care provider and use your crutches as directed. Safe use of crutches is extremely important. Misuse of crutches can cause you to fall or cause injury to nerves in your hands or armpits.  Do not put weight or pressure on the injured ankle until directed by your health care provider.  To lessen the swelling, keep the injured leg elevated while sitting or lying down.  Apply ice to the injured area:  Put ice in a plastic bag.  Place a towel between your cast and the bag.  Leave the ice on for 20 minutes, 2-3 times a day.  If you have a plaster or fiberglass cast:  Do not try to scratch the skin under the cast with any objects. This can increase your risk of skin infection.  Check the skin around the cast every day. You may put lotion on any red or sore areas.  Keep your cast dry and clean.  If you have a plaster splint:  Wear the splint as directed.  You may loosen the elastic  around the splint if your toes become numb, tingle, or turn cold or blue.  Do not put pressure on any part of your cast or splint; it may break. Rest your cast only on a pillow the first 24 hours until it is fully hardened.  Your cast or splint can be protected during bathing with a plastic bag sealed to your skin with medical tape. Do not lower the cast or splint into water.  Take medicines as directed by your health care provider. Only take over-the-counter or prescription medicines for pain, discomfort, or fever as directed by your health care provider.  Do not drive a vehicle until  your health care provider specifically tells you it is safe to do so.  If your health care provider has given you a follow-up appointment, it is very important to keep that appointment. Not keeping the appointment could result in a chronic or permanent injury, pain, and disability. If you have any problem keeping the appointment, call the facility for assistance. SEEK MEDICAL CARE IF: You develop increased swelling or discomfort. SEEK IMMEDIATE MEDICAL CARE IF:   Your cast gets damaged or breaks.  You have continued severe pain.  You develop new pain or swelling after the cast was put on.  Your skin or toenails below the injury turn blue or gray.  Your skin or toenails below the injury feel cold, numb, or have loss of sensitivity to touch.  There is a bad smell or pus draining from under the cast. MAKE SURE YOU:   Understand these instructions.  Will watch your condition.  Will get help right away if you are not doing well or get worse.   This information is not intended to replace advice given to you by your health care provider. Make sure you discuss any questions you have with your health care provider.   Follow up with orthopedics as soon as possible. Must have sutures removed in 8-10 days. Keep wound clean and dry. Avoid weight bearing as much as possible. Keep extremity elevated. Apply ice to affected area.

## 2015-04-26 DIAGNOSIS — S99911A Unspecified injury of right ankle, initial encounter: Secondary | ICD-10-CM | POA: Diagnosis not present

## 2015-04-26 DIAGNOSIS — S8261XA Displaced fracture of lateral malleolus of right fibula, initial encounter for closed fracture: Secondary | ICD-10-CM | POA: Diagnosis not present

## 2015-04-26 DIAGNOSIS — M25571 Pain in right ankle and joints of right foot: Secondary | ICD-10-CM | POA: Diagnosis not present

## 2015-04-26 DIAGNOSIS — S81811A Laceration without foreign body, right lower leg, initial encounter: Secondary | ICD-10-CM | POA: Diagnosis not present

## 2015-05-05 ENCOUNTER — Encounter: Payer: Self-pay | Admitting: Family Medicine

## 2015-05-05 ENCOUNTER — Ambulatory Visit (INDEPENDENT_AMBULATORY_CARE_PROVIDER_SITE_OTHER): Payer: Medicare HMO | Admitting: Family Medicine

## 2015-05-05 VITALS — BP 128/60 | HR 64 | Temp 97.5°F | Resp 16 | Ht 74.0 in

## 2015-05-05 DIAGNOSIS — L03031 Cellulitis of right toe: Secondary | ICD-10-CM | POA: Diagnosis not present

## 2015-05-05 DIAGNOSIS — Z5189 Encounter for other specified aftercare: Secondary | ICD-10-CM | POA: Diagnosis not present

## 2015-05-05 MED ORDER — CEPHALEXIN 500 MG PO CAPS: 500.0000 mg | ORAL_CAPSULE | Freq: Three times a day (TID) | ORAL | Status: DC

## 2015-05-05 NOTE — Progress Notes (Signed)
Subjective:    Patient ID: [name], male    DOB: 24-Feb-1935, 80 y.o.   MRN: [id number]  HPI On 10/31, he had a front end loader bucket fall on his right ankle.  He suffered: Small fracture at the distal tip of the lateral malleolus and adjacent talus with overlying soft tissue swelling Currently in a CAM walker under ortho care.  However, he also sustained a laceration to the dorsum of the ankle which was closed with sutures in the ER.  However, wound does not appear well-healed, in fact, I can separate wound edges with traction on the edges Sutures do not appear ready to come out.   Surrounding skin is erythematous, warm, and swollen.  Appears to have secondary cellulitis. Past Medical History  Diagnosis Date  . Atrial flutter (HCC)     and SVT that is post ablation  . Acute venous embolism and thrombosis of unspecified deep vessels of lower extremity   . Dyslipidemia   . CAD (coronary artery disease)   . HTN (hypertension)   . Shortness of breath     on exertion  . Chronic kidney disease     stones  . GERD (gastroesophageal reflux disease)    Past Surgical History  Procedure Laterality Date  . Cardiac catheterization      s/p cardiac catheterization and bypass surgery as well as EGD with dilation x3, left lower extremity vein stripping  . Left knee anthroscopy    . Coronary artery bypass graft    . Secondary closure arm    . Esophagogastroduodenoscopy N/A 12/06/2013    Procedure: ESOPHAGOGASTRODUODENOSCOPY (EGD);  Surgeon: Lear Ng, MD;  Location: [facility];  Service: Endoscopy;  Laterality: N/A;   Current Outpatient Prescriptions on File Prior to Visit  Medication Sig Dispense Refill  . aspirin 325 MG tablet Take 325 mg by mouth daily.     . clotrimazole-betamethasone (LOTRISONE) cream Apply 1 application topically 2 (two) times daily. 30 g 0  . lisinopril (PRINIVIL,ZESTRIL) 5 MG tablet Take 1 tablet (5 mg total) by mouth daily. 30 tablet 6  .  metoprolol succinate (TOPROL-XL) 25 MG 24 hr tablet Take 1 tablet (25 mg total) by mouth daily. 90 tablet 1  . nitroGLYCERIN (NITROSTAT) 0.4 MG SL tablet Place 0.4 mg under the tongue every 5 (five) minutes as needed for chest pain.    Marland Kitchen oxyCODONE-acetaminophen (PERCOCET/ROXICET) 5-325 MG tablet Take 2 tablets by mouth every 4 (four) hours as needed for severe pain. 20 tablet 0  . pantoprazole (PROTONIX) 40 MG tablet TAKE 1 TABLET (40 MG TOTAL) BY MOUTH DAILY. 30 tablet 11  . pravastatin (PRAVACHOL) 40 MG tablet Take 1 tablet (40 mg total) by mouth daily. 90 tablet 3   No current facility-administered medications on file prior to visit.   Allergies  Allergen Reactions  . Celebrex [Celecoxib] Rash    blisters   Social History   Social History  . Marital Status: Married    Spouse Name: N/A  . Number of Children: N/A  . Years of Education: N/A   Occupational History  . Not on file.   Social History Main Topics  . Smoking status: Current Every Day Smoker -- 1.00 packs/day    Types: Cigarettes  . Smokeless tobacco: Never Used     Comment: greater than 50 pack year history  . Alcohol Use: No  . Drug Use: No  . Sexual Activity: Not on file   Other Topics Concern  . 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      Review of Systems  All other systems reviewed and are negative.      Objective:   Physical Exam  Cardiovascular: Normal rate, regular rhythm and normal heart sounds.   Pulmonary/Chest: Effort normal and breath sounds normal.  Skin: Skin is warm. There is erythema.  Vitals reviewed.         Assessment & Plan:  Cellulitis of toe of right foot - Plan: cephALEXin (KEFLEX) 500 MG capsule  Visit for wound check  Treat cellulitis keflex 500 tid for 7 days and return for suture removal Tuesday.

## 2015-05-10 ENCOUNTER — Ambulatory Visit (INDEPENDENT_AMBULATORY_CARE_PROVIDER_SITE_OTHER): Payer: Medicare HMO | Admitting: Family Medicine

## 2015-05-10 ENCOUNTER — Encounter: Payer: Self-pay | Admitting: Family Medicine

## 2015-05-10 VITALS — BP 122/60 | HR 68 | Temp 97.3°F | Resp 18

## 2015-05-10 DIAGNOSIS — Z4802 Encounter for removal of sutures: Secondary | ICD-10-CM | POA: Diagnosis not present

## 2015-05-10 DIAGNOSIS — L03115 Cellulitis of right lower limb: Secondary | ICD-10-CM | POA: Diagnosis not present

## 2015-05-10 DIAGNOSIS — T148 Other injury of unspecified body region: Secondary | ICD-10-CM

## 2015-05-10 DIAGNOSIS — L089 Local infection of the skin and subcutaneous tissue, unspecified: Secondary | ICD-10-CM

## 2015-05-10 DIAGNOSIS — Z5189 Encounter for other specified aftercare: Secondary | ICD-10-CM

## 2015-05-10 DIAGNOSIS — T148XXA Other injury of unspecified body region, initial encounter: Secondary | ICD-10-CM

## 2015-05-10 MED ORDER — CEPHALEXIN 500 MG PO CAPS: 500.0000 mg | ORAL_CAPSULE | Freq: Three times a day (TID) | ORAL | Status: DC

## 2015-05-10 MED ORDER — OXYCODONE-ACETAMINOPHEN 5-325 MG PO TABS: 2.0000 | ORAL_TABLET | ORAL | Status: DC | PRN

## 2015-05-10 NOTE — Progress Notes (Signed)
Patient ID: [name], male   DOB: 11-18-1934, 80 y.o.   MRN: [id number]   Subjective:    Patient ID: [name], male    DOB: 1935-05-18, 80 y.o.   MRN: [id number]  Patient presents for Follow-up  patient here for suture removal and wound check. On October 31 he sustained a laceration to his right lower leg from a bucket. This resulted in a fracture and also a laceration. He came in last week for wound check he had secondary cellulitis was started on Keflex. The sutures were also not ready to come out at that time. His pain has improved he takes the pain medicine typically at bedtime otherwise during the day he is doing fairly well. He does have appointment with orthopedics for repeat x-rays for his fracture on Friday. He has not had any significant drainage from the wound.    Review Of Systems:  GEN- denies fatigue, fever, weight loss,weakness, recent illness HEENT- denies eye drainage, change in vision, nasal discharge, CVS- denies chest pain, palpitations RESP- denies SOB, cough, wheeze MSK- + joint pain, muscle aches, injury       Objective:    BP 122/60 mmHg  Pulse 68  Temp(Src) 97.3 F (36.3 C) (Oral)  Resp 18  Wt  GEN- NAD, alert and oriented x3, walking boot  EXT- RLE- laceration with sutures in tact, no drainage, erythema  and swelling around jagged laceration, mild TTP, (decreased from previous visit)   Sutures removed at bedside        Assessment & Plan:      Problem List Items Addressed This Visit    None    Visit Diagnoses    Visit for wound check    -  Primary    Improved cellulitis, but still has one small area of concern, extend antibiotics a few more days, sutures removal, incision intact. F/U ortho friday for fracture, pain meds refilled     Visit for suture removal        Infected laceration        Relevant Medications    cephALEXin (KEFLEX) 500 MG capsule    Cellulitis of Leg       Relevant Medications    cephALEXin (KEFLEX) 500 MG  capsule       Note: This dictation was prepared with Dragon dictation along with smaller phrase technology. Any transcriptional errors that result from this process are unintentional.

## 2015-05-10 NOTE — Patient Instructions (Signed)
Continue pain medications  Follow up on Friday with orthopedics as scheduled Antibiotics extended another 3 days Pain prescription refilled F/U Dr. Dennard Schaumann as needed

## 2015-05-10 NOTE — Addendum Note (Signed)
Addended by: Vic Blackbird F on: 05/10/2015 05:13 PM   Modules accepted: Level of Service

## 2015-05-12 DIAGNOSIS — S99911D Unspecified injury of right ankle, subsequent encounter: Secondary | ICD-10-CM | POA: Diagnosis not present

## 2015-05-24 DIAGNOSIS — H2513 Age-related nuclear cataract, bilateral: Secondary | ICD-10-CM | POA: Diagnosis not present

## 2015-05-24 DIAGNOSIS — H524 Presbyopia: Secondary | ICD-10-CM | POA: Diagnosis not present

## 2015-05-29 DIAGNOSIS — S81811D Laceration without foreign body, right lower leg, subsequent encounter: Secondary | ICD-10-CM | POA: Diagnosis not present

## 2015-05-29 DIAGNOSIS — S8261XD Displaced fracture of lateral malleolus of right fibula, subsequent encounter for closed fracture with routine healing: Secondary | ICD-10-CM | POA: Diagnosis not present

## 2015-05-29 DIAGNOSIS — M25571 Pain in right ankle and joints of right foot: Secondary | ICD-10-CM | POA: Diagnosis not present

## 2015-06-05 DIAGNOSIS — H2511 Age-related nuclear cataract, right eye: Secondary | ICD-10-CM | POA: Diagnosis not present

## 2015-06-08 DIAGNOSIS — S8261XD Displaced fracture of lateral malleolus of right fibula, subsequent encounter for closed fracture with routine healing: Secondary | ICD-10-CM | POA: Diagnosis not present

## 2015-06-12 DIAGNOSIS — H2511 Age-related nuclear cataract, right eye: Secondary | ICD-10-CM | POA: Diagnosis not present

## 2015-06-12 DIAGNOSIS — H25811 Combined forms of age-related cataract, right eye: Secondary | ICD-10-CM | POA: Diagnosis not present

## 2015-06-13 DIAGNOSIS — H43391 Other vitreous opacities, right eye: Secondary | ICD-10-CM | POA: Diagnosis not present

## 2015-06-13 DIAGNOSIS — H59021 Cataract (lens) fragments in eye following cataract surgery, right eye: Secondary | ICD-10-CM | POA: Diagnosis not present

## 2015-06-13 DIAGNOSIS — H31411 Hemorrhagic choroidal detachment, right eye: Secondary | ICD-10-CM | POA: Diagnosis not present

## 2015-06-13 DIAGNOSIS — H2512 Age-related nuclear cataract, left eye: Secondary | ICD-10-CM | POA: Diagnosis not present

## 2015-06-14 DIAGNOSIS — H31411 Hemorrhagic choroidal detachment, right eye: Secondary | ICD-10-CM | POA: Diagnosis not present

## 2015-06-14 DIAGNOSIS — H43391 Other vitreous opacities, right eye: Secondary | ICD-10-CM | POA: Diagnosis not present

## 2015-06-14 DIAGNOSIS — H59021 Cataract (lens) fragments in eye following cataract surgery, right eye: Secondary | ICD-10-CM | POA: Diagnosis not present

## 2015-06-14 DIAGNOSIS — T8131XA Disruption of external operation (surgical) wound, not elsewhere classified, initial encounter: Secondary | ICD-10-CM | POA: Diagnosis not present

## 2015-06-15 DIAGNOSIS — Z09 Encounter for follow-up examination after completed treatment for conditions other than malignant neoplasm: Secondary | ICD-10-CM | POA: Diagnosis not present

## 2015-06-15 DIAGNOSIS — H31411 Hemorrhagic choroidal detachment, right eye: Secondary | ICD-10-CM | POA: Diagnosis not present

## 2015-06-15 DIAGNOSIS — H18231 Secondary corneal edema, right eye: Secondary | ICD-10-CM | POA: Diagnosis not present

## 2015-06-15 DIAGNOSIS — H59021 Cataract (lens) fragments in eye following cataract surgery, right eye: Secondary | ICD-10-CM | POA: Diagnosis not present

## 2015-06-20 ENCOUNTER — Other Ambulatory Visit: Payer: Self-pay | Admitting: Cardiology

## 2015-07-21 ENCOUNTER — Telehealth: Payer: Self-pay | Admitting: Cardiology

## 2015-07-21 DIAGNOSIS — I6523 Occlusion and stenosis of bilateral carotid arteries: Secondary | ICD-10-CM

## 2015-07-21 NOTE — Telephone Encounter (Signed)
NEw Message  Pt grand dtr calling to see if Dr Stanford Breed still wants pt to have carotid ultrasound- no order in epic. Please call back and discuss.

## 2015-07-21 NOTE — Telephone Encounter (Signed)
Left message for dtr, he needs a follow up 6 months after carotid in June. Carotid orders placed for pt to call back and schedule

## 2015-07-24 ENCOUNTER — Ambulatory Visit (HOSPITAL_COMMUNITY)
Admission: RE | Admit: 2015-07-24 | Discharge: 2015-07-24 | Disposition: A | Discharge status: Home / Self Care | Payer: Medicare HMO | Source: Ambulatory Visit | Attending: Cardiology | Admitting: Cardiology

## 2015-07-24 DIAGNOSIS — N189 Chronic kidney disease, unspecified: Secondary | ICD-10-CM | POA: Diagnosis not present

## 2015-07-24 DIAGNOSIS — I129 Hypertensive chronic kidney disease with stage 1 through stage 4 chronic kidney disease, or unspecified chronic kidney disease: Secondary | ICD-10-CM | POA: Diagnosis not present

## 2015-07-24 DIAGNOSIS — E785 Hyperlipidemia, unspecified: Secondary | ICD-10-CM | POA: Insufficient documentation

## 2015-07-24 DIAGNOSIS — I6523 Occlusion and stenosis of bilateral carotid arteries: Secondary | ICD-10-CM | POA: Insufficient documentation

## 2015-08-11 ENCOUNTER — Encounter: Payer: Self-pay | Admitting: Family Medicine

## 2015-08-11 ENCOUNTER — Ambulatory Visit (INDEPENDENT_AMBULATORY_CARE_PROVIDER_SITE_OTHER): Payer: Medicare HMO | Admitting: Family Medicine

## 2015-08-11 VITALS — BP 128/64 | HR 68 | Temp 97.6°F | Resp 18 | Ht 74.0 in | Wt 187.0 lb

## 2015-08-11 DIAGNOSIS — L209 Atopic dermatitis, unspecified: Secondary | ICD-10-CM

## 2015-08-11 MED ORDER — TAMSULOSIN HCL 0.4 MG PO CAPS: 0.4000 mg | ORAL_CAPSULE | Freq: Every day | ORAL | Status: DC

## 2015-08-11 MED ORDER — PREDNISONE 20 MG PO TABS: ORAL_TABLET | ORAL | Status: DC

## 2015-08-11 MED ORDER — MOMETASONE FUROATE 0.1 % EX CREA: 1.0000 "application " | TOPICAL_CREAM | Freq: Every day | CUTANEOUS | Status: DC

## 2015-08-11 NOTE — Progress Notes (Signed)
Subjective:    Patient ID: [name], male    DOB: June 09, 1935, 81 y.o.   MRN: [id number]  HPI Patient has a diffuse widespread rash. Mainly on his lower trunk bilaterally. Rash consists of very faint erythematous papules and plaques covered with fine white scale and numerous excoriations. It is on his lower abdomen and lower back. Is on the dorsums of both forearms. He also states that it is on his legs as well. The rash is been there for over a month. It is very itchy. Past Medical History  Diagnosis Date  . Atrial flutter (HCC)     and SVT that is post ablation  . Acute venous embolism and thrombosis of unspecified deep vessels of lower extremity   . Dyslipidemia   . CAD (coronary artery disease)   . HTN (hypertension)   . Shortness of breath     on exertion  . Chronic kidney disease     stones  . GERD (gastroesophageal reflux disease)    Past Surgical History  Procedure Laterality Date  . Cardiac catheterization      s/p cardiac catheterization and bypass surgery as well as EGD with dilation x3, left lower extremity vein stripping  . Left knee anthroscopy    . Coronary artery bypass graft    . Secondary closure arm    . Esophagogastroduodenoscopy N/A 12/06/2013    Procedure: ESOPHAGOGASTRODUODENOSCOPY (EGD);  Surgeon: Lear Ng, MD;  Location: [facility];  Service: Endoscopy;  Laterality: N/A;   Current Outpatient Prescriptions on File Prior to Visit  Medication Sig Dispense Refill  . aspirin 325 MG tablet Take 325 mg by mouth daily.     . clotrimazole-betamethasone (LOTRISONE) cream Apply 1 application topically 2 (two) times daily. 30 g 0  . lisinopril (PRINIVIL,ZESTRIL) 5 MG tablet Take 1 tablet (5 mg total) by mouth daily. 30 tablet 6  . metoprolol succinate (TOPROL-XL) 25 MG 24 hr tablet Take 1 tablet (25 mg total) by mouth daily. 90 tablet 1  . nitroGLYCERIN (NITROSTAT) 0.4 MG SL tablet Place 0.4 mg under the tongue every 5 (five) minutes as needed for  chest pain.    Marland Kitchen oxyCODONE-acetaminophen (PERCOCET/ROXICET) 5-325 MG tablet Take 2 tablets by mouth every 4 (four) hours as needed for severe pain. 45 tablet 0  . pantoprazole (PROTONIX) 40 MG tablet TAKE 1 TABLET (40 MG TOTAL) BY MOUTH DAILY. 30 tablet 11  . pravastatin (PRAVACHOL) 40 MG tablet Take 1 tablet (40 mg total) by mouth daily. 90 tablet 3   No current facility-administered medications on file prior to visit.   Allergies  Allergen Reactions  . Celebrex [Celecoxib] Rash    blisters   Social History   Social History  . Marital Status: Married    Spouse Name: N/A  . Number of Children: N/A  . Years of Education: N/A   Occupational History  . Not on file.   Social History Main Topics  . Smoking status: Current Every Day Smoker -- 1.00 packs/day    Types: Cigarettes  . Smokeless tobacco: Never Used     Comment: greater than 50 pack year history  . Alcohol Use: No  . Drug Use: No  . Sexual Activity: Not on file   Other Topics Concern  . 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  Review of Systems  All other systems reviewed and are negative.      Objective:   Physical Exam  Constitutional: He appears well-developed and well-nourished.  Cardiovascular: Normal rate, regular rhythm and normal heart sounds.   Pulmonary/Chest: Effort normal and breath sounds normal. No respiratory distress. He has no wheezes. He has no rales.  Skin: Rash noted. There is erythema.  Vitals reviewed.         Assessment & Plan:  Atopic dermatitis - Plan: predniSONE (DELTASONE) 20 MG tablet, mometasone (ELOCON) 0.1 % cream  Differential diagnosis includes nummular eczema, dsap, guttate psoriasis.  Trial of prednisone pills followed by Elocon cream once daily. If symptoms are not improving proceed with a biopsy of the involved lesion versus  a dermatology referral

## 2015-10-26 ENCOUNTER — Encounter: Payer: Self-pay | Admitting: Family Medicine

## 2015-10-26 ENCOUNTER — Ambulatory Visit (INDEPENDENT_AMBULATORY_CARE_PROVIDER_SITE_OTHER): Payer: Medicare HMO | Admitting: Family Medicine

## 2015-10-26 ENCOUNTER — Ambulatory Visit: Payer: Medicare HMO | Admitting: Family Medicine

## 2015-10-26 VITALS — BP 110/72 | HR 64 | Temp 97.9°F | Resp 16 | Ht 74.0 in | Wt 183.0 lb

## 2015-10-26 DIAGNOSIS — L439 Lichen planus, unspecified: Secondary | ICD-10-CM | POA: Diagnosis not present

## 2015-10-26 MED ORDER — FLUOCINONIDE-E 0.05 % EX CREA: 1.0000 "application " | TOPICAL_CREAM | Freq: Two times a day (BID) | CUTANEOUS | Status: DC

## 2015-10-26 NOTE — Progress Notes (Signed)
Subjective:    Patient ID: [name], male    DOB: [birth year], 81 y.o.   MRN: [id number]  HPI  Patient has a one-month history of a rash on the dorsum of his left forearm. It is very itchy. The rash consists of violaceous papules in a cluster. It appears to possibly be lichen planus. There are clustered on the dorsum of his left forearm and a patch proximally 6 cm in diameter. There is no rash anywhere else on his body. He is tried some previous steroid creams that have given him with very little benefit. May have started after he switched cholesterol medication Past Medical History  Diagnosis Date  . Atrial flutter (HCC)     and SVT that is post ablation  . Acute venous embolism and thrombosis of unspecified deep vessels of lower extremity   . Dyslipidemia   . CAD (coronary artery disease)   . HTN (hypertension)   . Shortness of breath     on exertion  . Chronic kidney disease     stones  . GERD (gastroesophageal reflux disease)    Past Surgical History  Procedure Laterality Date  . Cardiac catheterization      s/p cardiac catheterization and bypass surgery as well as EGD with dilation x3, left lower extremity vein stripping  . Left knee anthroscopy    . Coronary artery bypass graft    . Secondary closure arm    . Esophagogastroduodenoscopy N/A 12/06/2013    Procedure: ESOPHAGOGASTRODUODENOSCOPY (EGD);  Surgeon: Lear Ng, MD;  Location: [facility];  Service: Endoscopy;  Laterality: N/A;   Current Outpatient Prescriptions on File Prior to Visit  Medication Sig Dispense Refill  . aspirin 325 MG tablet Take 325 mg by mouth daily.     Marland Kitchen lisinopril (PRINIVIL,ZESTRIL) 5 MG tablet Take 1 tablet (5 mg total) by mouth daily. 30 tablet 6  . metoprolol succinate (TOPROL-XL) 25 MG 24 hr tablet Take 1 tablet (25 mg total) by mouth daily. 90 tablet 1  . nitroGLYCERIN (NITROSTAT) 0.4 MG SL tablet Place 0.4 mg under the tongue every 5 (five) minutes as needed for chest pain.     . pantoprazole (PROTONIX) 40 MG tablet TAKE 1 TABLET (40 MG TOTAL) BY MOUTH DAILY. 30 tablet 11  . pravastatin (PRAVACHOL) 40 MG tablet Take 1 tablet (40 mg total) by mouth daily. 90 tablet 3  . clotrimazole-betamethasone (LOTRISONE) cream Apply 1 application topically 2 (two) times daily. (Patient not taking: Reported on 10/26/2015) 30 g 0  . mometasone (ELOCON) 0.1 % cream Apply 1 application topically daily. (Patient not taking: Reported on 10/26/2015) 45 g 0  . predniSONE (DELTASONE) 20 MG tablet 3 tabs poqday 1-2, 2 tabs poqday 3-4, 1 tab poqday 5-6 (Patient not taking: Reported on 10/26/2015) 12 tablet 0   No current facility-administered medications on file prior to visit.   Allergies  Allergen Reactions  . Celebrex [Celecoxib] Rash    blisters   Social History   Social History  . Marital Status: Married    Spouse Name: N/A  . Number of Children: N/A  . Years of Education: N/A   Occupational History  . Not on file.   Social History Main Topics  . Smoking status: Current Every Day Smoker -- 1.00 packs/day    Types: Cigarettes  . Smokeless tobacco: Never Used     Comment: greater than 50 pack year history  . Alcohol Use: No  . Drug Use: No  . Sexual Activity:  Not on file   Other Topics Concern  . 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       Review of Systems  All other systems reviewed and are negative.      Objective:   Physical Exam  Constitutional: He appears well-developed and well-nourished.  Cardiovascular: Normal rate, regular rhythm and normal heart sounds.   Pulmonary/Chest: Effort normal and breath sounds normal. No respiratory distress. He has no wheezes. He has no rales.  Skin: Rash noted. There is erythema.  Vitals reviewed.         Assessment & Plan:  Lichen planus - Plan: fluocinonide-emollient (LIDEX-E)  0.05 % cream  Try Lidex twice daily for 7-10 days. Recheck if no better in 1 week. Consider skin biopsy if persistent to confirm lichen planus.

## 2015-12-28 DIAGNOSIS — H2512 Age-related nuclear cataract, left eye: Secondary | ICD-10-CM | POA: Diagnosis not present

## 2015-12-28 DIAGNOSIS — H35371 Puckering of macula, right eye: Secondary | ICD-10-CM | POA: Diagnosis not present

## 2015-12-29 NOTE — Progress Notes (Signed)
HPI: FU coronary artery disease, status post coronary bypassing graft. This was performed in 1998. His last catheterization in October 2008 showed a patent LIMA to the LAD and a patent saphenous vein graft to the right coronary artery. However, he did have significant left main disease that bifurcated into the circumflex. He had a drug-eluting stent to his left main at that time. Abdominal ultrasound in August of 2011 showed no aneurysm. His last Myoview was performed in July 2015. At that time, his ejection fraction was 53% and there was normal perfusion. He also has a history of SVT and atrial flutter ablation. Carotid Dopplers January 2017 showed 60-79% bilateral stenosis and follow-up recommended 1 year. Since I last saw him He has dyspnea on exertion but no orthopnea, PND or pedal edema. Occasional indigestion but no exertional chest pain. No ischemia.  Current Outpatient Prescriptions  Medication Sig Dispense Refill  . aspirin 325 MG tablet Take 325 mg by mouth daily.     Marland Kitchen atropine 1 % ophthalmic solution 1 drop 3 (three) times daily.    . clotrimazole-betamethasone (LOTRISONE) cream Apply 1 application topically 2 (two) times daily. 30 g 0  . fluocinonide-emollient (LIDEX-E) 0.05 % cream Apply 1 application topically 2 (two) times daily. 60 g 0  . lisinopril (PRINIVIL,ZESTRIL) 5 MG tablet Take 1 tablet (5 mg total) by mouth daily. 30 tablet 6  . metoprolol succinate (TOPROL-XL) 25 MG 24 hr tablet Take 1 tablet (25 mg total) by mouth daily. 90 tablet 1  . mometasone (ELOCON) 0.1 % cream Apply 1 application topically daily. 45 g 0  . nitroGLYCERIN (NITROSTAT) 0.4 MG SL tablet Place 0.4 mg under the tongue every 5 (five) minutes as needed for chest pain.    . pantoprazole (PROTONIX) 40 MG tablet TAKE 1 TABLET (40 MG TOTAL) BY MOUTH DAILY. 30 tablet 11  . pravastatin (PRAVACHOL) 40 MG tablet Take 1 tablet (40 mg total) by mouth daily. 90 tablet 3  . predniSONE (DELTASONE) 20 MG tablet 3  tabs poqday 1-2, 2 tabs poqday 3-4, 1 tab poqday 5-6 12 tablet 0   No current facility-administered medications for this visit.     Past Medical History  Diagnosis Date  . Atrial flutter (HCC)     and SVT that is post ablation  . Acute venous embolism and thrombosis of unspecified deep vessels of lower extremity   . Dyslipidemia   . CAD (coronary artery disease)   . HTN (hypertension)   . Shortness of breath     on exertion  . Chronic kidney disease     stones  . GERD (gastroesophageal reflux disease)     Past Surgical History  Procedure Laterality Date  . Cardiac catheterization      s/p cardiac catheterization and bypass surgery as well as EGD with dilation x3, left lower extremity vein stripping  . Left knee anthroscopy    . Coronary artery bypass graft    . Secondary closure arm    . Esophagogastroduodenoscopy N/A 12/06/2013    Procedure: ESOPHAGOGASTRODUODENOSCOPY (EGD);  Surgeon: Lear Ng, MD;  Location: [facility];  Service: Endoscopy;  Laterality: N/A;    Social History   Social History  . Marital Status: Married    Spouse Name: N/A  . Number of Children: N/A  . Years of Education: N/A   Occupational History  . Not on file.   Social History Main Topics  . Smoking status: Current Every Day Smoker -- 1.00 packs/day  Types: Cigarettes  . Smokeless tobacco: Never Used     Comment: greater than 50 pack year history  . Alcohol Use: No  . Drug Use: No  . Sexual Activity: Not on file   Other Topics Concern  . 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     History reviewed. No pertinent family history.  ROS: no fevers or chills, productive cough, hemoptysis, dysphasia, odynophagia, melena, hematochezia, dysuria, hematuria, rash, seizure activity, orthopnea, PND, pedal edema, claudication. Remaining systems are  negative.  Physical Exam: Well-developed well-nourished in no acute distress.  Skin is warm and dry.  HEENT is normal.  Neck is supple.  Chest is clear to auscultation with normal expansion.  Cardiovascular exam is regular rate and rhythm.  Abdominal exam nontender or distended. No masses palpated. Extremities show no edema. neuro grossly intact  ECG Sinus bradycardia at a rate of 57. No ST changes.  Assessment and plan  1 Coronary artery disease-continue aspirin and statin. He notes dyspnea on exertion. Plan Lexiscan nuclear study for risk stratification.  2 hyperlipidemia-continue statin. Check lipids and liver. He has not tolerated high-dose statin previously.  3 hypertension- blood pressure controlled. Continue present medications. Check potassium and renal function.  4 Tobacco abuse-patient counseled on discontinuing.  5. Carotid disease-continue aspirin and statin. Follow-up carotid Dopplers January 2018.  6 history of atrial flutter ablation.  Kirk Ruths, MD

## 2016-01-03 ENCOUNTER — Encounter: Payer: Self-pay | Admitting: Cardiology

## 2016-01-03 ENCOUNTER — Ambulatory Visit (INDEPENDENT_AMBULATORY_CARE_PROVIDER_SITE_OTHER): Payer: Medicare HMO | Admitting: Cardiology

## 2016-01-03 VITALS — BP 127/75 | HR 57 | Ht 73.0 in | Wt 179.0 lb

## 2016-01-03 DIAGNOSIS — I2510 Atherosclerotic heart disease of native coronary artery without angina pectoris: Secondary | ICD-10-CM

## 2016-01-03 DIAGNOSIS — E785 Hyperlipidemia, unspecified: Secondary | ICD-10-CM | POA: Diagnosis not present

## 2016-01-03 NOTE — Patient Instructions (Signed)
Medications  No change   Lab work  Return for lab work the day you come in for the stress test.   Procedures  Your physician has requested that you have a [place]. For further information please visit [email or web address]. Please follow instruction sheet, as given.   Follow-up in 1 year with Dr. Stanford Breed.

## 2016-01-11 ENCOUNTER — Telehealth (HOSPITAL_COMMUNITY): Payer: Self-pay

## 2016-01-11 ENCOUNTER — Other Ambulatory Visit: Payer: Self-pay | Admitting: Family Medicine

## 2016-01-11 NOTE — Telephone Encounter (Signed)
Encounter complete. 

## 2016-01-16 ENCOUNTER — Ambulatory Visit (HOSPITAL_COMMUNITY)
Admission: RE | Admit: 2016-01-16 | Discharge: 2016-01-16 | Disposition: A | Discharge status: Home / Self Care | Payer: Medicare HMO | Source: Ambulatory Visit | Attending: Cardiovascular Disease | Admitting: Cardiovascular Disease

## 2016-01-16 DIAGNOSIS — I2510 Atherosclerotic heart disease of native coronary artery without angina pectoris: Secondary | ICD-10-CM

## 2016-01-16 DIAGNOSIS — N189 Chronic kidney disease, unspecified: Secondary | ICD-10-CM | POA: Diagnosis not present

## 2016-01-16 DIAGNOSIS — Z8249 Family history of ischemic heart disease and other diseases of the circulatory system: Secondary | ICD-10-CM | POA: Diagnosis not present

## 2016-01-16 DIAGNOSIS — I6523 Occlusion and stenosis of bilateral carotid arteries: Secondary | ICD-10-CM | POA: Diagnosis not present

## 2016-01-16 DIAGNOSIS — E785 Hyperlipidemia, unspecified: Secondary | ICD-10-CM | POA: Diagnosis not present

## 2016-01-16 DIAGNOSIS — R0609 Other forms of dyspnea: Secondary | ICD-10-CM | POA: Diagnosis not present

## 2016-01-16 DIAGNOSIS — I129 Hypertensive chronic kidney disease with stage 1 through stage 4 chronic kidney disease, or unspecified chronic kidney disease: Secondary | ICD-10-CM | POA: Insufficient documentation

## 2016-01-16 LAB — MYOCARDIAL PERFUSION IMAGING
CHL CUP NUCLEAR SDS: 0
CHL CUP NUCLEAR SRS: 2
CHL CUP NUCLEAR SSS: 2
CSEPPHR: 68 {beats}/min
LV dias vol: 99 mL (ref 62–150)
LV sys vol: 41 mL
Rest HR: 49 {beats}/min
TID: 1.24

## 2016-01-16 MED ORDER — REGADENOSON 0.4 MG/5ML IV SOLN
0.4000 mg | Freq: Once | INTRAVENOUS | Status: AC
  Administered 2016-01-16: 0.4 mg via INTRAVENOUS

## 2016-01-16 MED ORDER — TECHNETIUM TC 99M TETROFOSMIN IV KIT
29.2000 | PACK | Freq: Once | INTRAVENOUS | Status: AC | PRN
  Administered 2016-01-16: 29.2 via INTRAVENOUS
  Filled 2016-01-16: qty 29

## 2016-01-16 MED ORDER — AMINOPHYLLINE 25 MG/ML IV SOLN
75.0000 mg | Freq: Once | INTRAVENOUS | Status: AC
  Administered 2016-01-16: 75 mg via INTRAVENOUS

## 2016-01-16 MED ORDER — TECHNETIUM TC 99M TETROFOSMIN IV KIT
9.9000 | PACK | Freq: Once | INTRAVENOUS | Status: AC | PRN
  Administered 2016-01-16: 10 via INTRAVENOUS
  Filled 2016-01-16: qty 10

## 2016-01-17 LAB — BASIC METABOLIC PANEL
BUN: 16 mg/dL (ref 7–25)
CO2: 27 mmol/L (ref 20–31)
CREATININE: 1.36 mg/dL — AB (ref 0.70–1.18)
Calcium: 9.3 mg/dL (ref 8.6–10.3)
Chloride: 109 mmol/L (ref 98–110)
GLUCOSE: 122 mg/dL — AB (ref 65–99)
Potassium: 5.1 mmol/L (ref 3.5–5.3)
Sodium: 142 mmol/L (ref 135–146)

## 2016-01-17 LAB — HEPATIC FUNCTION PANEL
ALK PHOS: 67 U/L (ref 40–115)
ALT: 7 U/L — AB (ref 9–46)
AST: 13 U/L (ref 10–35)
Albumin: 4.3 g/dL (ref 3.6–5.1)
BILIRUBIN INDIRECT: 0.6 mg/dL (ref 0.2–1.2)
Bilirubin, Direct: 0.1 mg/dL (ref <=0.2)
TOTAL PROTEIN: 7.6 g/dL (ref 6.1–8.1)
Total Bilirubin: 0.7 mg/dL (ref 0.2–1.2)

## 2016-01-17 LAB — LIPID PANEL
CHOLESTEROL: 148 mg/dL (ref 125–200)
HDL: 37 mg/dL — ABNORMAL LOW (ref >=40)
LDL Cholesterol: 91 mg/dL (ref <130)
TRIGLYCERIDES: 101 mg/dL (ref <150)
Total CHOL/HDL Ratio: 4 Ratio (ref <=5.0)
VLDL: 20 mg/dL (ref <30)

## 2016-01-26 ENCOUNTER — Other Ambulatory Visit: Payer: Self-pay | Admitting: Cardiology

## 2016-01-26 NOTE — Telephone Encounter (Signed)
Rx(s) sent to pharmacy electronically.  

## 2016-01-29 ENCOUNTER — Other Ambulatory Visit: Payer: Self-pay | Admitting: Cardiology

## 2016-01-30 DIAGNOSIS — H43811 Vitreous degeneration, right eye: Secondary | ICD-10-CM | POA: Diagnosis not present

## 2016-01-30 DIAGNOSIS — H35371 Puckering of macula, right eye: Secondary | ICD-10-CM | POA: Diagnosis not present

## 2016-02-22 DIAGNOSIS — H43811 Vitreous degeneration, right eye: Secondary | ICD-10-CM | POA: Diagnosis not present

## 2016-02-22 DIAGNOSIS — H59021 Cataract (lens) fragments in eye following cataract surgery, right eye: Secondary | ICD-10-CM | POA: Diagnosis not present

## 2016-02-22 DIAGNOSIS — H43823 Vitreomacular adhesion, bilateral: Secondary | ICD-10-CM | POA: Diagnosis not present

## 2016-02-22 DIAGNOSIS — H18233 Secondary corneal edema, bilateral: Secondary | ICD-10-CM | POA: Diagnosis not present

## 2016-03-25 ENCOUNTER — Other Ambulatory Visit: Payer: Self-pay | Admitting: Family Medicine

## 2016-03-25 NOTE — Telephone Encounter (Signed)
RX refilled per protocol

## 2016-05-02 ENCOUNTER — Ambulatory Visit (INDEPENDENT_AMBULATORY_CARE_PROVIDER_SITE_OTHER): Payer: Medicare HMO | Admitting: Family Medicine

## 2016-05-02 VITALS — BP 130/50 | HR 72 | Temp 98.2°F | Resp 18 | Ht 74.0 in | Wt 177.0 lb

## 2016-05-02 DIAGNOSIS — R7301 Impaired fasting glucose: Secondary | ICD-10-CM | POA: Diagnosis not present

## 2016-05-02 DIAGNOSIS — E7800 Pure hypercholesterolemia, unspecified: Secondary | ICD-10-CM

## 2016-05-02 DIAGNOSIS — I10 Essential (primary) hypertension: Secondary | ICD-10-CM

## 2016-05-02 DIAGNOSIS — Z0000 Encounter for general adult medical examination without abnormal findings: Secondary | ICD-10-CM | POA: Diagnosis not present

## 2016-05-02 DIAGNOSIS — I2510 Atherosclerotic heart disease of native coronary artery without angina pectoris: Secondary | ICD-10-CM | POA: Diagnosis not present

## 2016-05-02 DIAGNOSIS — Z23 Encounter for immunization: Secondary | ICD-10-CM | POA: Diagnosis not present

## 2016-05-02 NOTE — Progress Notes (Signed)
Subjective:    Patient ID: [name], male    DOB: 25-May-1935, 81 y.o.   MRN: [id number]  HPI  Patient is here today for her regular checkup. He has a past medical history of hypertension, hyperlipidemia, coronary artery disease status post CABG (remote), remote history of DVT. Remote history of GERD, remote history of atrial flutter and SVT status post ablation. He is here today for regular lab work in routine follow-up.  Patient had myocardial perfusion testing performed in July which revealed no reversible ischemia or EKG changes. He had lab work checked in July which revealed a fasting blood sugar of 122, and HDL cholesterol 37, and an LDL cholesterol of 91 with a creatinine of 1.36.  Regarding his preventative care, patient is overdue for a flu shot, Pneumovax 23, Prevnar 13, the shingles vaccine, and a tetanus shot. He was adamant that he did not want to receive any immunizations. I spent more than 10 minutes discussing the importance of immunizations with this patient. Ultimately he acquiesced and received Pneumovax 23 today in clinic and will return fasting tomorrow for lab work and at that time he agrees to receive the flu shot. He has never had a colonoscopy and is adamant that he will not have a colonoscopy. Due to his age, I recommended against prostate cancer screening.  I reviewed his intake form he has had a fall in the last year and scraped his left knee getting off a tractor. However he has no problems with balance. This was climbing off a tractor when he lost his balance. He does report having a hard time hearing people and he needs to wear hearing aid but he is not interested. He denies any memory loss or depression Past Medical History:  Diagnosis Date  . Acute venous embolism and thrombosis of unspecified deep vessels of lower extremity   . Atrial flutter (HCC)    and SVT that is post ablation  . CAD (coronary artery disease)   . Chronic kidney disease    stones  .  Dyslipidemia   . GERD (gastroesophageal reflux disease)   . HTN (hypertension)   . Shortness of breath    on exertion   Past Surgical History:  Procedure Laterality Date  . CARDIAC CATHETERIZATION     s/p cardiac catheterization and bypass surgery as well as EGD with dilation x3, left lower extremity vein stripping  . CORONARY ARTERY BYPASS GRAFT    . ESOPHAGOGASTRODUODENOSCOPY N/A 12/06/2013   Procedure: ESOPHAGOGASTRODUODENOSCOPY (EGD);  Surgeon: Lear Ng, MD;  Location: [facility];  Service: Endoscopy;  Laterality: N/A;  . left knee anthroscopy    . SECONDARY CLOSURE ARM     Current Outpatient Prescriptions on File Prior to Visit  Medication Sig Dispense Refill  . aspirin 325 MG tablet Take 325 mg by mouth daily.     Marland Kitchen atropine 1 % ophthalmic solution 1 drop 3 (three) times daily.    . clotrimazole-betamethasone (LOTRISONE) cream Apply 1 application topically 2 (two) times daily. 30 g 0  . fluocinonide-emollient (LIDEX-E) 0.05 % cream Apply 1 application topically 2 (two) times daily. 60 g 0  . lisinopril (PRINIVIL,ZESTRIL) 5 MG tablet Take 1 tablet (5 mg total) by mouth daily. 30 tablet 6  . metoprolol succinate (TOPROL-XL) 25 MG 24 hr tablet Take 1 tablet (25 mg total) by mouth daily. 30 tablet 6  . mometasone (ELOCON) 0.1 % cream APPLY TO AFFECTED AREA EVERY DAY 45 g 1  . nitroGLYCERIN (NITROSTAT)  0.4 MG SL tablet Place 0.4 mg under the tongue every 5 (five) minutes as needed for chest pain.    . pantoprazole (PROTONIX) 40 MG tablet TAKE 1 TABLET BY MOUTH EVERY DAY 30 tablet 7  . pravastatin (PRAVACHOL) 40 MG tablet Take 1 tablet (40 mg total) by mouth daily. 90 tablet 3   No current facility-administered medications on file prior to visit.    Allergies  Allergen Reactions  . Celebrex [Celecoxib] Rash    blisters   Social History   Social History  . Marital status: Married    Spouse name: N/A  . Number of children: N/A  . Years of education: N/A    Occupational History  . Not on file.   Social History Main Topics  . Smoking status: Current Every Day Smoker    Packs/day: 1.00    Types: Cigarettes  . Smokeless tobacco: Never Used     Comment: greater than 50 pack year history  . Alcohol use No  . Drug use: No  . Sexual activity: Not on file   Other Topics Concern  . 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        Review of Systems  All other systems reviewed and are negative.      Objective:   Physical Exam  Constitutional: He is oriented to person, place, and time. He appears well-developed and well-nourished.  Neck: Neck supple. No JVD present. No thyromegaly present.  Cardiovascular: Normal rate, regular rhythm, normal heart sounds and intact distal pulses.   No murmur heard. Pulmonary/Chest: Effort normal and breath sounds normal. No respiratory distress. He has no wheezes. He has no rales.  Abdominal: Soft. Bowel sounds are normal. He exhibits no distension. There is no tenderness. There is no rebound and no guarding.  Musculoskeletal: He exhibits no edema.  Lymphadenopathy:    He has no cervical adenopathy.  Neurological: He is alert and oriented to person, place, and time. He has normal reflexes. No cranial nerve deficit. He exhibits normal muscle tone. Coordination normal.  Skin: No erythema.  Vitals reviewed.         Assessment & Plan:  Benign essential HTN  Pure hypercholesterolemia  Atherosclerosis of native coronary artery of native heart without angina pectoris  Physical exam today is completely normal outside of his hearing loss. I would like the patient to return fasting for a CBC, CMP, fasting lipid panel. He will return tomorrow. He received Pneumovax 23 today in clinic. He will receive his flu shot tomorrow. He refuses a colonoscopy. We agreed not to  screen for prostate cancer due to his advanced age. Regular anticipatory guidance is provided.  I will also check a hemoglobin A1c because his last blood work was significant for an elevated fasting blood sugar

## 2016-05-03 ENCOUNTER — Ambulatory Visit (INDEPENDENT_AMBULATORY_CARE_PROVIDER_SITE_OTHER): Payer: Medicare HMO

## 2016-05-03 ENCOUNTER — Other Ambulatory Visit: Payer: Medicare HMO

## 2016-05-03 DIAGNOSIS — Z23 Encounter for immunization: Secondary | ICD-10-CM

## 2016-05-03 DIAGNOSIS — R7301 Impaired fasting glucose: Secondary | ICD-10-CM | POA: Diagnosis not present

## 2016-05-04 LAB — HEMOGLOBIN A1C
Hgb A1c MFr Bld: 7.3 % — ABNORMAL HIGH (ref <5.7)
Mean Plasma Glucose: 163 mg/dL

## 2016-05-08 ENCOUNTER — Other Ambulatory Visit: Payer: Self-pay | Admitting: Family Medicine

## 2016-05-08 DIAGNOSIS — R69 Illness, unspecified: Secondary | ICD-10-CM | POA: Diagnosis not present

## 2016-05-08 MED ORDER — ONETOUCH ULTRA SYSTEM W/DEVICE KIT: 1.0000 | PACK | Freq: Once | 0 refills | Status: AC

## 2016-05-08 MED ORDER — ONETOUCH ULTRASOFT LANCETS MISC: 12 refills | Status: DC

## 2016-05-08 MED ORDER — GLUCOSE BLOOD VI STRP: ORAL_STRIP | 12 refills | Status: DC

## 2016-05-20 DIAGNOSIS — H2512 Age-related nuclear cataract, left eye: Secondary | ICD-10-CM | POA: Diagnosis not present

## 2016-06-07 ENCOUNTER — Ambulatory Visit (INDEPENDENT_AMBULATORY_CARE_PROVIDER_SITE_OTHER): Payer: Medicare HMO | Admitting: Family Medicine

## 2016-06-07 ENCOUNTER — Encounter: Payer: Self-pay | Admitting: Family Medicine

## 2016-06-07 VITALS — BP 128/62 | HR 78 | Temp 97.7°F | Resp 18 | Ht 74.0 in | Wt 176.0 lb

## 2016-06-07 DIAGNOSIS — E119 Type 2 diabetes mellitus without complications: Secondary | ICD-10-CM

## 2016-06-07 LAB — BASIC METABOLIC PANEL WITH GFR
BUN: 15 mg/dL (ref 7–25)
CALCIUM: 9.3 mg/dL (ref 8.6–10.3)
CHLORIDE: 105 mmol/L (ref 98–110)
CO2: 28 mmol/L (ref 20–31)
CREATININE: 1.3 mg/dL — AB (ref 0.70–1.18)
GFR, EST AFRICAN AMERICAN: 60 mL/min (ref >=60)
GFR, Est Non African American: 52 mL/min — ABNORMAL LOW (ref >=60)
Glucose, Bld: 143 mg/dL — ABNORMAL HIGH (ref 70–99)
Potassium: 5 mmol/L (ref 3.5–5.3)
SODIUM: 140 mmol/L (ref 135–146)

## 2016-06-07 LAB — HEMOGLOBIN A1C
HEMOGLOBIN A1C: 7.1 % — AB (ref <5.7)
MEAN PLASMA GLUCOSE: 157 mg/dL

## 2016-06-07 NOTE — Progress Notes (Signed)
Subjective:    Patient ID: [name], male    DOB: 09-27-1934, 81 y.o.   MRN: [id number]  HPI In November, the patient was found to have a hemoglobin A1c of 7.3. He admits to eating cakes and ties quite regularly. He also drinks 1 or 2 Pepsi's every day in addition to sweet tea. At that time, we started the patient on Januvia 50 mg a day and also recommended dietary changes. He is here today to follow-up. He is not checking his blood sugar. He denies any side effects from the [place]. He denies any polyuria, polydipsia, or blurred vision. Past Medical History:  Diagnosis Date  . Acute venous embolism and thrombosis of unspecified deep vessels of lower extremity   . Atrial flutter (HCC)    and SVT that is post ablation  . CAD (coronary artery disease)   . Chronic kidney disease    stones  . Dyslipidemia   . GERD (gastroesophageal reflux disease)   . HTN (hypertension)   . Shortness of breath    on exertion   Past Surgical History:  Procedure Laterality Date  . CARDIAC CATHETERIZATION     s/p cardiac catheterization and bypass surgery as well as EGD with dilation x3, left lower extremity vein stripping  . CORONARY ARTERY BYPASS GRAFT    . ESOPHAGOGASTRODUODENOSCOPY N/A 12/06/2013   Procedure: ESOPHAGOGASTRODUODENOSCOPY (EGD);  Surgeon: Lear Ng, MD;  Location: [facility];  Service: Endoscopy;  Laterality: N/A;  . left knee anthroscopy    . SECONDARY CLOSURE ARM     Current Outpatient Prescriptions on File Prior to Visit  Medication Sig Dispense Refill  . aspirin 325 MG tablet Take 325 mg by mouth daily.     Marland Kitchen atropine 1 % ophthalmic solution 1 drop 3 (three) times daily.    . clotrimazole-betamethasone (LOTRISONE) cream Apply 1 application topically 2 (two) times daily. 30 g 0  . fluocinonide-emollient (LIDEX-E) 0.05 % cream Apply 1 application topically 2 (two) times daily. 60 g 0  . glucose blood test strip Use to check FBS qd E11.9 100 each 12  . Lancets  (ONETOUCH ULTRASOFT) lancets Use as instructed 100 each 12  . lisinopril (PRINIVIL,ZESTRIL) 5 MG tablet Take 1 tablet (5 mg total) by mouth daily. 30 tablet 6  . metoprolol succinate (TOPROL-XL) 25 MG 24 hr tablet Take 1 tablet (25 mg total) by mouth daily. 30 tablet 6  . mometasone (ELOCON) 0.1 % cream APPLY TO AFFECTED AREA EVERY DAY 45 g 1  . nitroGLYCERIN (NITROSTAT) 0.4 MG SL tablet Place 0.4 mg under the tongue every 5 (five) minutes as needed for chest pain.    . pantoprazole (PROTONIX) 40 MG tablet TAKE 1 TABLET BY MOUTH EVERY DAY 30 tablet 7  . pravastatin (PRAVACHOL) 40 MG tablet Take 1 tablet (40 mg total) by mouth daily. 90 tablet 3   No current facility-administered medications on file prior to visit.    Allergies  Allergen Reactions  . Celebrex [Celecoxib] Rash    blisters   Social History   Social History  . Marital status: Married    Spouse name: N/A  . Number of children: N/A  . Years of education: N/A   Occupational History  . Not on file.   Social History Main Topics  . Smoking status: Current Every Day Smoker    Packs/day: 1.00    Types: Cigarettes  . Smokeless tobacco: Never Used     Comment: greater than 50 pack year history  .  Alcohol use No  . Drug use: No  . Sexual activity: Not on file   Other Topics Concern  . 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       Review of Systems  All other systems reviewed and are negative.      Objective:   Physical Exam  Cardiovascular: Normal rate, regular rhythm and normal heart sounds.  Exam reveals no gallop and no friction rub.   No murmur heard. Pulmonary/Chest: Effort normal and breath sounds normal. No respiratory distress. He has no wheezes. He has no rales.  Abdominal: Soft. Bowel sounds are normal.  Vitals reviewed.         Assessment & Plan:    Controlled type 2 diabetes mellitus without complication, without long-term current use of insulin (HCC) - Plan: BASIC METABOLIC PANEL WITH GFR, Hemoglobin A1c  Continue Januvia 50 mg a day. Patient would like to recheck his hemoglobin A1c to confirm. I strongly encouraged him to cut back on his ingestion of sugar. I recommended abstinence from sodas and sweet tea and reduction in his cake and pot consumption. However at 81 years old the patient states he is unlikely to make these changes. Therefore we may have to increase medication if his sugar test do not improved

## 2016-06-19 ENCOUNTER — Other Ambulatory Visit: Payer: Self-pay | Admitting: Family Medicine

## 2016-06-19 MED ORDER — SITAGLIPTIN PHOSPHATE 50 MG PO TABS: 50.0000 mg | ORAL_TABLET | Freq: Every day | ORAL | 11 refills | Status: DC

## 2016-07-02 DIAGNOSIS — H2512 Age-related nuclear cataract, left eye: Secondary | ICD-10-CM | POA: Diagnosis not present

## 2016-07-03 DIAGNOSIS — H18232 Secondary corneal edema, left eye: Secondary | ICD-10-CM | POA: Diagnosis not present

## 2016-07-03 DIAGNOSIS — H3092 Unspecified chorioretinal inflammation, left eye: Secondary | ICD-10-CM | POA: Diagnosis not present

## 2016-07-03 DIAGNOSIS — H59022 Cataract (lens) fragments in eye following cataract surgery, left eye: Secondary | ICD-10-CM | POA: Diagnosis not present

## 2016-07-03 DIAGNOSIS — Z961 Presence of intraocular lens: Secondary | ICD-10-CM | POA: Diagnosis not present

## 2016-07-04 DIAGNOSIS — H59022 Cataract (lens) fragments in eye following cataract surgery, left eye: Secondary | ICD-10-CM | POA: Diagnosis not present

## 2016-07-04 DIAGNOSIS — H18231 Secondary corneal edema, right eye: Secondary | ICD-10-CM | POA: Diagnosis not present

## 2016-07-04 DIAGNOSIS — H3092 Unspecified chorioretinal inflammation, left eye: Secondary | ICD-10-CM | POA: Diagnosis not present

## 2016-07-09 DIAGNOSIS — Z9842 Cataract extraction status, left eye: Secondary | ICD-10-CM | POA: Diagnosis not present

## 2016-07-09 DIAGNOSIS — H2512 Age-related nuclear cataract, left eye: Secondary | ICD-10-CM | POA: Diagnosis not present

## 2016-07-15 DIAGNOSIS — H3092 Unspecified chorioretinal inflammation, left eye: Secondary | ICD-10-CM | POA: Diagnosis not present

## 2016-07-15 DIAGNOSIS — H18231 Secondary corneal edema, right eye: Secondary | ICD-10-CM | POA: Diagnosis not present

## 2016-07-15 DIAGNOSIS — H59022 Cataract (lens) fragments in eye following cataract surgery, left eye: Secondary | ICD-10-CM | POA: Diagnosis not present

## 2016-07-15 DIAGNOSIS — H43391 Other vitreous opacities, right eye: Secondary | ICD-10-CM | POA: Diagnosis not present

## 2016-07-16 DIAGNOSIS — H59022 Cataract (lens) fragments in eye following cataract surgery, left eye: Secondary | ICD-10-CM | POA: Diagnosis not present

## 2016-07-16 DIAGNOSIS — H3562 Retinal hemorrhage, left eye: Secondary | ICD-10-CM | POA: Diagnosis not present

## 2016-07-16 DIAGNOSIS — H33322 Round hole, left eye: Secondary | ICD-10-CM | POA: Diagnosis not present

## 2016-07-26 ENCOUNTER — Other Ambulatory Visit: Payer: Self-pay | Admitting: Cardiology

## 2016-07-26 DIAGNOSIS — E785 Hyperlipidemia, unspecified: Secondary | ICD-10-CM

## 2016-08-08 ENCOUNTER — Telehealth: Payer: Self-pay | Admitting: Family Medicine

## 2016-08-08 NOTE — Telephone Encounter (Signed)
Pt is having trouble getting his diabetic medication refilled they told him it would be $148.00 and he cannot afford it wants to know if there is something else that can be subsituted for it. Please call him back. "he does not know the name of the medication" uses [facility] [address]

## 2016-08-09 MED ORDER — METFORMIN HCL 500 MG PO TABS: 500.0000 mg | ORAL_TABLET | Freq: Two times a day (BID) | ORAL | 3 refills | Status: DC

## 2016-08-09 NOTE — Telephone Encounter (Signed)
Per Dr. Dennard Schaumann switch [name] to Metformin '500mg'$  bid - Medication called/sent to requested pharmacy and pt aware via vm.

## 2016-08-12 ENCOUNTER — Other Ambulatory Visit: Payer: Self-pay | Admitting: Cardiology

## 2016-08-12 DIAGNOSIS — I6523 Occlusion and stenosis of bilateral carotid arteries: Secondary | ICD-10-CM

## 2016-08-14 ENCOUNTER — Ambulatory Visit (HOSPITAL_COMMUNITY)
Admission: RE | Admit: 2016-08-14 | Discharge: 2016-08-14 | Disposition: A | Discharge status: Home / Self Care | Payer: Medicare HMO | Source: Ambulatory Visit | Attending: Cardiology | Admitting: Cardiology

## 2016-08-14 DIAGNOSIS — Z720 Tobacco use: Secondary | ICD-10-CM | POA: Insufficient documentation

## 2016-08-14 DIAGNOSIS — Z951 Presence of aortocoronary bypass graft: Secondary | ICD-10-CM | POA: Diagnosis not present

## 2016-08-14 DIAGNOSIS — I2510 Atherosclerotic heart disease of native coronary artery without angina pectoris: Secondary | ICD-10-CM | POA: Diagnosis not present

## 2016-08-14 DIAGNOSIS — I6523 Occlusion and stenosis of bilateral carotid arteries: Secondary | ICD-10-CM | POA: Diagnosis not present

## 2016-08-14 DIAGNOSIS — I10 Essential (primary) hypertension: Secondary | ICD-10-CM | POA: Diagnosis not present

## 2016-08-14 DIAGNOSIS — E785 Hyperlipidemia, unspecified: Secondary | ICD-10-CM | POA: Insufficient documentation

## 2016-09-03 ENCOUNTER — Other Ambulatory Visit: Payer: Self-pay | Admitting: *Deleted

## 2016-09-03 ENCOUNTER — Encounter: Payer: Self-pay | Admitting: Family Medicine

## 2016-09-03 ENCOUNTER — Ambulatory Visit (INDEPENDENT_AMBULATORY_CARE_PROVIDER_SITE_OTHER): Payer: Medicare HMO | Admitting: Family Medicine

## 2016-09-03 VITALS — BP 132/70 | HR 76 | Temp 97.9°F | Resp 14 | Ht 74.0 in | Wt 179.0 lb

## 2016-09-03 DIAGNOSIS — Q809 Congenital ichthyosis, unspecified: Secondary | ICD-10-CM

## 2016-09-03 MED ORDER — FLUOCINONIDE-E 0.05 % EX CREA: 1.0000 "application " | TOPICAL_CREAM | Freq: Two times a day (BID) | CUTANEOUS | 0 refills | Status: DC

## 2016-09-03 MED ORDER — FLUOCINONIDE 0.05 % EX CREA: 1.0000 "application " | TOPICAL_CREAM | Freq: Two times a day (BID) | CUTANEOUS | 0 refills | Status: DC

## 2016-09-03 NOTE — Progress Notes (Signed)
Subjective:    Patient ID: [name], male    DOB: Dec 19, 1934, 82 y.o.   MRN: [id number]  HPI Patient reports an itchy rash all over his body. On the posterior aspects of both legs, he has dry cracked skin in a scalelike pattern consistent with ichthyosis. He has a similar rash on his lower back and on the posterior aspects of both shoulders. On his forearms, he has scattered erythematous small papules 3-4 mm in diameter. There are approximately 2 or 3 on his left dorsal forearm and 1-2 on his right dorsal forearm. 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 Acute venous embolism and thrombosis of unspecified deep vessels of lower extremity   . Atrial flutter (HCC)    and SVT that is post ablation  . CAD (coronary artery disease)   . Chronic kidney disease    stones  . Dyslipidemia   . GERD (gastroesophageal reflux disease)   . HTN (hypertension)   . Shortness of breath    on exertion   Past Surgical History:  Procedure Laterality Date  . CARDIAC CATHETERIZATION     s/p cardiac catheterization and bypass surgery as well as EGD with dilation x3, left lower extremity vein stripping  . CORONARY ARTERY BYPASS GRAFT    . ESOPHAGOGASTRODUODENOSCOPY N/A 12/06/2013   Procedure: ESOPHAGOGASTRODUODENOSCOPY (EGD);  Surgeon: Lear Ng, MD;  Location: [facility];  Service: Endoscopy;  Laterality: N/A;  . left knee anthroscopy    . SECONDARY CLOSURE ARM    . Current Outpatient Prescriptions on File Prior to Visit  Medication Sig Dispense Refill  . aspirin 325 MG tablet Take 325 mg by mouth daily.     Marland Kitchen atropine 1 % ophthalmic solution 1 drop 3 (three) times daily.    Marland Kitchen glucose blood test strip Use to check FBS qd E11.9 100 each 12  . Lancets (ONETOUCH ULTRASOFT) lancets Use as instructed 100 each 12  . lisinopril (PRINIVIL,ZESTRIL) 5 MG tablet Take 1 tablet (5 mg total) by mouth daily. 30 tablet 6    . metFORMIN (GLUCOPHAGE) 500 MG tablet Take 1 tablet (500 mg total) by mouth 2 (two) times daily with a meal. 180 tablet 3  . metoprolol succinate (TOPROL-XL) 25 MG 24 hr tablet Take 1 tablet (25 mg total) by mouth daily. 30 tablet 6  . nitroGLYCERIN (NITROSTAT) 0.4 MG SL tablet Place 0.4 mg under the tongue every 5 (five) minutes as needed for chest pain.    . pantoprazole (PROTONIX) 40 MG tablet TAKE 1 TABLET BY MOUTH EVERY DAY 30 tablet 7  . pravastatin (PRAVACHOL) 40 MG tablet TAKE 1 TABLET EVERY DAY 90 tablet 2   No current facility-administered medications on file prior to visit.    Allergies  Allergen Reactions  . Celebrex [Celecoxib] Rash    blisters   Social History   Social History  . Marital status: Married    Spouse name: N/A  . Number of children: N/A  . Years of education: N/A   Occupational History  . Not on file.   Social History Main Topics  . Smoking status: Current Every Day Smoker    Packs/day: 1.00    Types: Cigarettes  . Smokeless tobacco: Never Used     Comment: greater than 50 pack year history  . Alcohol use No  . Drug use: No  . Sexual activity: Not on file   Other Topics Concern  .  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       Review of Systems  All other systems reviewed and are negative.      Objective:   Physical Exam  Constitutional: He appears well-developed and well-nourished.  Cardiovascular: Normal rate, regular rhythm and normal heart sounds.   Pulmonary/Chest: Effort normal and breath sounds normal. No respiratory distress. He has no wheezes. He has no rales.  Skin: Rash noted. There is erythema.  Vitals reviewed.         Assessment & Plan:  Ichthyosis - Plan: fluocinonide-emollient (LIDEX-E) 0.05 % cream  I explained to the patient that this is a chronic condition that cannot be  cured. Is essentially severe dry skin. He can treat it symptomatically with Lidex cream applied once daily as needed. He can also use moisturizers as often as needed to help calm the rash and irritation.

## 2016-09-17 ENCOUNTER — Ambulatory Visit: Payer: Medicare HMO

## 2016-09-17 DIAGNOSIS — M79675 Pain in left toe(s): Secondary | ICD-10-CM

## 2016-09-17 NOTE — Progress Notes (Signed)
[name] came into the office with left toe pain 2nd to his big toe. Patient states there is no pain,there is no swelling,just soreness, however the toe is a little red with a circle in the middle of the toe. Pain states he has tried peroxide as well as neosporin and has had the pain for three days now.   Advised patient to not wear the cowboy boots because the pressure could be causing  the toe to hurt, advised the patient to wear more comfortable shoes not tight fitting around the toes.

## 2016-09-19 ENCOUNTER — Encounter: Payer: Self-pay | Admitting: Family Medicine

## 2016-09-19 ENCOUNTER — Ambulatory Visit: Payer: Medicare HMO | Admitting: Family Medicine

## 2016-09-19 ENCOUNTER — Ambulatory Visit (INDEPENDENT_AMBULATORY_CARE_PROVIDER_SITE_OTHER): Payer: Medicare HMO | Admitting: Family Medicine

## 2016-09-19 VITALS — BP 136/78 | HR 74 | Temp 97.6°F | Resp 18 | Ht 74.0 in | Wt 179.0 lb

## 2016-09-19 DIAGNOSIS — L03032 Cellulitis of left toe: Secondary | ICD-10-CM | POA: Diagnosis not present

## 2016-09-19 MED ORDER — CEPHALEXIN 500 MG PO CAPS: 500.0000 mg | ORAL_CAPSULE | Freq: Three times a day (TID) | ORAL | 0 refills | Status: DC

## 2016-09-19 NOTE — Progress Notes (Signed)
Subjective:    Patient ID: [name], male    DOB: 12-Dec-1934, 82 y.o.   MRN: [id number]  HPI 09/03/16 Patient reports an itchy rash all over his body. On the posterior aspects of both legs, he has dry cracked skin in a scalelike pattern consistent with ichthyosis. He has a similar rash on his lower back and on the posterior aspects of both shoulders. On his forearms, he has scattered erythematous small papules 3-4 mm in diameter. There are approximately 2 or 3 on his left dorsal forearm and 1-2 on his right dorsal forearm. 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  At that time, my plan was: I explained to the patient that this is a chronic condition that cannot be cured. Is essentially severe dry skin. He can treat it symptomatically with Lidex cream applied once daily as needed. He can also use moisturizers as often as needed to help calm the rash and irritation.  09/19/16 The patient states that the rash is no better. He points to spots on his hands. There is no visible rash other than dry scaly skin. I continue to encourage him to use emollients such as Vaseline and moisturizers. However his primary concern is a swollen tender red toe. The second toe on the left foot has been red swollen and painful for 1 week. There is a small ulcer/abrasion on the dorsum of the toe above the PIP joint. I believe this is the source of the infection. On examination today it is not hot to the touch but it is very erythematous and tender to the touch --------------------------------------------  Past Medical History:  Diagnosis Date  . Acute venous embolism and thrombosis of unspecified deep vessels of lower extremity   . Atrial flutter (HCC)    and SVT that is post ablation  . CAD (coronary artery disease)   . Chronic kidney disease    stones  . Dyslipidemia   . GERD (gastroesophageal reflux disease)   . HTN (hypertension)   . Shortness of breath    on  exertion   Past Surgical History:  Procedure Laterality Date  . CARDIAC CATHETERIZATION     s/p cardiac catheterization and bypass surgery as well as EGD with dilation x3, left lower extremity vein stripping  . CORONARY ARTERY BYPASS GRAFT    . ESOPHAGOGASTRODUODENOSCOPY N/A 12/06/2013   Procedure: ESOPHAGOGASTRODUODENOSCOPY (EGD);  Surgeon: Lear Ng, MD;  Location: [facility];  Service: Endoscopy;  Laterality: N/A;  . left knee anthroscopy    . SECONDARY CLOSURE ARM    . Current Outpatient Prescriptions on File Prior to Visit  Medication Sig Dispense Refill  . aspirin 325 MG tablet Take 325 mg by mouth daily.     Marland Kitchen atropine 1 % ophthalmic solution 1 drop 3 (three) times daily.    . fluocinonide cream (LIDEX) 7.25 % Apply 1 application topically 2 (two) times daily. 30 g 0  . glucose blood test strip Use to check FBS qd E11.9 100 each 12  . Lancets (ONETOUCH ULTRASOFT) lancets Use as instructed 100 each 12  . lisinopril (PRINIVIL,ZESTRIL) 5 MG tablet Take 1 tablet (5 mg total) by mouth daily. 30 tablet 6  . metFORMIN (GLUCOPHAGE) 500 MG tablet Take 1 tablet (500 mg total) by mouth 2 (two) times daily with a meal. 180 tablet 3  . metoprolol succinate (TOPROL-XL) 25 MG 24 hr tablet Take 1 tablet (25 mg total) by mouth daily. 30 tablet 6  .  nitroGLYCERIN (NITROSTAT) 0.4 MG SL tablet Place 0.4 mg under the tongue every 5 (five) minutes as needed for chest pain.    . pantoprazole (PROTONIX) 40 MG tablet TAKE 1 TABLET BY MOUTH EVERY DAY 30 tablet 7  . pravastatin (PRAVACHOL) 40 MG tablet TAKE 1 TABLET EVERY DAY 90 tablet 2   No current facility-administered medications on file prior to visit.    Allergies  Allergen Reactions  . Celebrex [Celecoxib] Rash    blisters   Social History   Social History  . Marital status: Married    Spouse name: N/A  . Number of children: N/A  . Years of education: N/A   Occupational History  . Not on file.   Social History Main Topics    . Smoking status: Current Every Day Smoker    Packs/day: 1.00    Types: Cigarettes  . Smokeless tobacco: Never Used     Comment: greater than 50 pack year history  . Alcohol use No  . Drug use: No  . Sexual activity: Not on file   Other Topics Concern  . 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       Review of Systems  All other systems reviewed and are negative.      Objective:   Physical Exam  Constitutional: He appears well-developed and well-nourished.  Cardiovascular: Normal rate, regular rhythm and normal heart sounds.   Pulmonary/Chest: Effort normal and breath sounds normal. No respiratory distress. He has no wheezes. He has no rales.  Musculoskeletal:       Feet:  Vitals reviewed.   Erythema as outlined on diagram.        Assessment & Plan:  Cellulitis of second toe of left foot - Plan: cephALEXin (KEFLEX) 500 MG capsule  Begin Keflex 500 mg by mouth 3 times a day for 1 week. Recheck in one week sooner if worsening or spreading.

## 2016-09-23 ENCOUNTER — Other Ambulatory Visit: Payer: Self-pay | Admitting: Cardiology

## 2016-09-23 ENCOUNTER — Ambulatory Visit: Payer: Medicare HMO | Admitting: Family Medicine

## 2016-09-23 NOTE — Telephone Encounter (Signed)
Rx request sent to pharmacy.  

## 2016-09-26 ENCOUNTER — Ambulatory Visit (INDEPENDENT_AMBULATORY_CARE_PROVIDER_SITE_OTHER): Payer: Medicare HMO | Admitting: Family Medicine

## 2016-09-26 DIAGNOSIS — S0181XA Laceration without foreign body of other part of head, initial encounter: Secondary | ICD-10-CM

## 2016-09-26 MED ORDER — TRIAMCINOLONE ACETONIDE 0.1 % EX CREA: 1.0000 "application " | TOPICAL_CREAM | Freq: Two times a day (BID) | CUTANEOUS | 0 refills | Status: DC

## 2016-09-26 NOTE — Progress Notes (Signed)
Subjective:    Patient ID: [name], male    DOB: July 05, 1934, 82 y.o.   MRN: [id number]  HPI  Slipped walking on rocky terrain and fell striking his nasal bridge and forehead.  Sustained three small lacerations each less than 5 mm in length but bleeding steadily for >1 hour.  Denies LOC, headache, dizziness, blurry vison, nausea or vomiting.    Did bruise his left knee, but has full rom and no bleeding bt superficial small abrasion there.   Past Medical History:  Diagnosis Date  . Acute venous embolism and thrombosis of unspecified deep vessels of lower extremity   . Atrial flutter (HCC)    and SVT that is post ablation  . CAD (coronary artery disease)   . Chronic kidney disease    stones  . Dyslipidemia   . GERD (gastroesophageal reflux disease)   . HTN (hypertension)   . Shortness of breath    on exertion   Past Surgical History:  Procedure Laterality Date  . CARDIAC CATHETERIZATION     s/p cardiac catheterization and bypass surgery as well as EGD with dilation x3, left lower extremity vein stripping  . CORONARY ARTERY BYPASS GRAFT    . ESOPHAGOGASTRODUODENOSCOPY N/A 12/06/2013   Procedure: ESOPHAGOGASTRODUODENOSCOPY (EGD);  Surgeon: Lear Ng, MD;  Location: [facility];  Service: Endoscopy;  Laterality: N/A;  . left knee anthroscopy    . SECONDARY CLOSURE ARM     Current Outpatient Prescriptions on File Prior to Visit  Medication Sig Dispense Refill  . aspirin 325 MG tablet Take 325 mg by mouth daily.     Marland Kitchen atropine 1 % ophthalmic solution 1 drop 3 (three) times daily.    . cephALEXin (KEFLEX) 500 MG capsule Take 1 capsule (500 mg total) by mouth 3 (three) times daily. 21 capsule 0  . fluocinonide cream (LIDEX) 2.72 % Apply 1 application topically 2 (two) times daily. 30 g 0  . glucose blood test strip Use to check FBS qd E11.9 100 each 12  . Lancets (ONETOUCH ULTRASOFT) lancets Use as instructed 100 each 12  . lisinopril (PRINIVIL,ZESTRIL) 5 MG tablet  Take 1 tablet (5 mg total) by mouth daily. 30 tablet 6  . metFORMIN (GLUCOPHAGE) 500 MG tablet Take 1 tablet (500 mg total) by mouth 2 (two) times daily with a meal. 180 tablet 3  . metoprolol succinate (TOPROL-XL) 25 MG 24 hr tablet TAKE 1 TABLET BY MOUTH ONCE DAILY 30 tablet 6  . nitroGLYCERIN (NITROSTAT) 0.4 MG SL tablet Place 0.4 mg under the tongue every 5 (five) minutes as needed for chest pain.    . pantoprazole (PROTONIX) 40 MG tablet TAKE 1 TABLET BY MOUTH EVERY DAY 30 tablet 7  . pravastatin (PRAVACHOL) 40 MG tablet TAKE 1 TABLET EVERY DAY 90 tablet 2   No current facility-administered medications on file prior to visit.    Allergies  Allergen Reactions  . Celebrex [Celecoxib] Rash    blisters   Social History   Social History  . Marital status: Married    Spouse name: N/A  . Number of children: N/A  . Years of education: N/A   Occupational History  . Not on file.   Social History Main Topics  . Smoking status: Current Every Day Smoker    Packs/day: 1.00    Types: Cigarettes  . Smokeless tobacco: Never Used     Comment: greater than 50 pack year history  . Alcohol use No  . Drug use: No  .  Sexual activity: Not on file   Other Topics Concern  . 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     Review of Systems  All other systems reviewed and are negative.      Objective:   Physical Exam  Constitutional: He is oriented to person, place, and time. He appears well-developed and well-nourished.  Cardiovascular: Normal rate, regular rhythm and normal heart sounds.   Pulmonary/Chest: Effort normal and breath sounds normal. No respiratory distress. He has no wheezes. He has no rales.  Neurological: He is alert and oriented to person, place, and time. He has normal reflexes. He displays normal reflexes. No cranial nerve deficit. He  exhibits normal muscle tone. Coordination normal.   5 mm horizontal lac on forehead between eyes, 4 mm on right side of nose, 64m on left side of nose       Assessment & Plan:  Facial laceration, initial encounter  Each abrasion closed with 1 simple interrupted 3-0 ethilon suture using sterile technique and local anesthesia with lidocaine 0.1%. Wounds were cleaned thoroughly with normal saline prior to closure.  Monitored for 1 hour in clinic.  No headache or neuro deficit.  No evidence of concussion.  Recheck in 1 week.

## 2016-09-30 ENCOUNTER — Ambulatory Visit: Payer: Medicare HMO | Admitting: Family Medicine

## 2016-10-03 DIAGNOSIS — Z9841 Cataract extraction status, right eye: Secondary | ICD-10-CM | POA: Diagnosis not present

## 2016-10-03 DIAGNOSIS — Z9842 Cataract extraction status, left eye: Secondary | ICD-10-CM | POA: Diagnosis not present

## 2016-10-03 DIAGNOSIS — H59093 Other disorders of the eye following cataract surgery, bilateral: Secondary | ICD-10-CM | POA: Diagnosis not present

## 2016-10-04 ENCOUNTER — Ambulatory Visit (INDEPENDENT_AMBULATORY_CARE_PROVIDER_SITE_OTHER): Payer: Medicare HMO | Admitting: Family Medicine

## 2016-10-04 ENCOUNTER — Ambulatory Visit
Admission: RE | Admit: 2016-10-04 | Discharge: 2016-10-04 | Disposition: A | Discharge status: Home / Self Care | Payer: Medicare HMO | Source: Ambulatory Visit | Attending: Family Medicine | Admitting: Family Medicine

## 2016-10-04 ENCOUNTER — Encounter: Payer: Self-pay | Admitting: Family Medicine

## 2016-10-04 VITALS — BP 130/70 | HR 78 | Temp 97.4°F | Resp 18 | Ht 74.0 in | Wt 175.0 lb

## 2016-10-04 DIAGNOSIS — M25462 Effusion, left knee: Secondary | ICD-10-CM | POA: Diagnosis not present

## 2016-10-04 DIAGNOSIS — Z4802 Encounter for removal of sutures: Secondary | ICD-10-CM | POA: Diagnosis not present

## 2016-10-04 DIAGNOSIS — M25562 Pain in left knee: Secondary | ICD-10-CM

## 2016-10-04 NOTE — Progress Notes (Signed)
Subjective:    Patient ID: [name], male    DOB: 1934-09-16, 82 y.o.   MRN: [id number]  HPI 09/26/16 Slipped walking on rocky terrain and fell striking his nasal bridge and forehead.  Sustained three small lacerations each less than 5 mm in length but bleeding steadily for >1 hour.  Denies LOC, headache, dizziness, blurry vison, nausea or vomiting.    Did bruise his left knee, but has full rom and no bleeding bt superficial small abrasion there.  At that time, my plan was: Each abrasion closed with 1 simple interrupted 3-0 ethilon suture using sterile technique and local anesthesia with lidocaine 0.1%. Wounds were cleaned thoroughly with normal saline prior to closure.  Monitored for 1 hour in clinic.  No headache or neuro deficit.  No evidence of concussion.  Recheck in 1 week.    10/04/16 Here today for suture removal. 3 sutures removed without difficulty. The wound on his nasal bridge is healing nicely. However he continues to have severe pain in his left knee. There is a small effusion in his left knee. There is ecchymosis over the anterior tibial plateau. He also reports that the knee feels like it's going to give out on him at times. He has pain with standing and pain with walking Past Medical History:  Diagnosis Date  . Acute venous embolism and thrombosis of unspecified deep vessels of lower extremity   . Atrial flutter (HCC)    and SVT that is post ablation  . CAD (coronary artery disease)   . Chronic kidney disease    stones  . Dyslipidemia   . GERD (gastroesophageal reflux disease)   . HTN (hypertension)   . Shortness of breath    on exertion   Past Surgical History:  Procedure Laterality Date  . CARDIAC CATHETERIZATION     s/p cardiac catheterization and bypass surgery as well as EGD with dilation x3, left lower extremity vein stripping  . CORONARY ARTERY BYPASS GRAFT    . ESOPHAGOGASTRODUODENOSCOPY N/A 12/06/2013   Procedure: ESOPHAGOGASTRODUODENOSCOPY (EGD);   Surgeon: Lear Ng, MD;  Location: [facility];  Service: Endoscopy;  Laterality: N/A;  . left knee anthroscopy    . SECONDARY CLOSURE ARM     Current Outpatient Prescriptions on File Prior to Visit  Medication Sig Dispense Refill  . aspirin 325 MG tablet Take 325 mg by mouth daily.     Marland Kitchen atropine 1 % ophthalmic solution 1 drop 3 (three) times daily.    . cephALEXin (KEFLEX) 500 MG capsule Take 1 capsule (500 mg total) by mouth 3 (three) times daily. 21 capsule 0  . fluocinonide cream (LIDEX) 3.26 % Apply 1 application topically 2 (two) times daily. 30 g 0  . glucose blood test strip Use to check FBS qd E11.9 100 each 12  . Lancets (ONETOUCH ULTRASOFT) lancets Use as instructed 100 each 12  . lisinopril (PRINIVIL,ZESTRIL) 5 MG tablet Take 1 tablet (5 mg total) by mouth daily. 30 tablet 6  . metFORMIN (GLUCOPHAGE) 500 MG tablet Take 1 tablet (500 mg total) by mouth 2 (two) times daily with a meal. 180 tablet 3  . metoprolol succinate (TOPROL-XL) 25 MG 24 hr tablet TAKE 1 TABLET BY MOUTH ONCE DAILY 30 tablet 6  . nitroGLYCERIN (NITROSTAT) 0.4 MG SL tablet Place 0.4 mg under the tongue every 5 (five) minutes as needed for chest pain.    . pantoprazole (PROTONIX) 40 MG tablet TAKE 1 TABLET BY MOUTH EVERY DAY 30 tablet  7  . pravastatin (PRAVACHOL) 40 MG tablet TAKE 1 TABLET EVERY DAY 90 tablet 2  . triamcinolone cream (KENALOG) 0.1 % Apply 1 application topically 2 (two) times daily. 453.6 g 0   No current facility-administered medications on file prior to visit.    Allergies  Allergen Reactions  . Celebrex [Celecoxib] Rash    blisters   Social History   Social History  . Marital status: Married    Spouse name: N/A  . Number of children: N/A  . Years of education: N/A   Occupational History  . Not on file.   Social History Main Topics  . Smoking status: Current Every Day Smoker    Packs/day: 1.00    Types: Cigarettes  . Smokeless tobacco: Never Used     Comment:  greater than 50 pack year history  . Alcohol use No  . Drug use: No  . Sexual activity: Not on file   Other Topics Concern  . 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     Review of Systems  All other systems reviewed and are negative.      Objective:   Physical Exam  Constitutional: He is oriented to person, place, and time. He appears well-developed and well-nourished.  Cardiovascular: Normal rate, regular rhythm and normal heart sounds.   Pulmonary/Chest: Effort normal and breath sounds normal. No respiratory distress. He has no wheezes. He has no rales.  Musculoskeletal:       Left knee: He exhibits decreased range of motion, swelling, effusion and ecchymosis. Tenderness found. Medial joint line and lateral joint line tenderness noted.  Neurological: He is alert and oriented to person, place, and time. He has normal reflexes. No cranial nerve deficit. He exhibits normal muscle tone. Coordination normal.        Assessment & Plan:  Acute pain of left knee - Plan: DG Knee Complete 4 Views Left  Visit for suture removal  Proceed with an x-ray of the left knee to rule out an occult fracture. If there is no fracture, the patient can return for cortisone injection as he may have torn a meniscus when he twisted his knee as he fell causing the pain and instability in his knee.

## 2016-11-07 ENCOUNTER — Ambulatory Visit (INDEPENDENT_AMBULATORY_CARE_PROVIDER_SITE_OTHER): Payer: Medicare HMO | Admitting: Family Medicine

## 2016-11-07 ENCOUNTER — Encounter: Payer: Self-pay | Admitting: Family Medicine

## 2016-11-07 VITALS — BP 126/72 | HR 76 | Temp 97.5°F | Resp 16 | Ht 74.0 in | Wt 168.0 lb

## 2016-11-07 DIAGNOSIS — S80861A Insect bite (nonvenomous), right lower leg, initial encounter: Secondary | ICD-10-CM | POA: Diagnosis not present

## 2016-11-07 DIAGNOSIS — W57XXXA Bitten or stung by nonvenomous insect and other nonvenomous arthropods, initial encounter: Secondary | ICD-10-CM

## 2016-11-07 MED ORDER — DOXYCYCLINE HYCLATE 100 MG PO TABS: 100.0000 mg | ORAL_TABLET | Freq: Two times a day (BID) | ORAL | 0 refills | Status: DC

## 2016-11-07 NOTE — Progress Notes (Signed)
Subjective:    Patient ID: [name], male    DOB: 19-Jan-1935, 82 y.o.   MRN: [id number]  HPI Patient was recently bitten by more than 20 ticks.  There are numerous papular lesions on his legs and on his abdomen. 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 One lesion is in his right axilla. There is a spreading red ring approximately the size of a golf ball in his right axilla concerning for erythema migrans. Past Medical History:  Diagnosis Date  . Acute venous embolism and thrombosis of unspecified deep vessels of lower extremity   . Atrial flutter (HCC)    and SVT that is post ablation  . CAD (coronary artery disease)   . Chronic kidney disease    stones  . Dyslipidemia   . GERD (gastroesophageal reflux disease)   . HTN (hypertension)   . Shortness of breath    on exertion   Past Surgical History:  Procedure Laterality Date  . CARDIAC CATHETERIZATION     s/p cardiac catheterization and bypass surgery as well as EGD with dilation x3, left lower extremity vein stripping  . CORONARY ARTERY BYPASS GRAFT    . ESOPHAGOGASTRODUODENOSCOPY N/A 12/06/2013   Procedure: ESOPHAGOGASTRODUODENOSCOPY (EGD);  Surgeon: Lear Ng, MD;  Location: [facility];  Service: Endoscopy;  Laterality: N/A;  . left knee anthroscopy    . SECONDARY CLOSURE ARM     Current Outpatient Prescriptions on File Prior to Visit  Medication Sig Dispense Refill  . aspirin 325 MG tablet Take 325 mg by mouth daily.     Marland Kitchen atropine 1 % ophthalmic solution 1 drop 3 (three) times daily.    . cephALEXin (KEFLEX) 500 MG capsule Take 1 capsule (500 mg total) by mouth 3 (three) times daily. 21 capsule 0  . glucose blood test strip Use to check FBS qd E11.9 100 each 12  . Lancets (ONETOUCH ULTRASOFT) lancets Use as instructed 100 each 12  . lisinopril (PRINIVIL,ZESTRIL) 5 MG tablet Take 1 tablet (5 mg total) by mouth daily. 30 tablet 6  . metFORMIN (GLUCOPHAGE) 500 MG tablet Take 1 tablet (500 mg  total) by mouth 2 (two) times daily with a meal. 180 tablet 3  . metoprolol succinate (TOPROL-XL) 25 MG 24 hr tablet TAKE 1 TABLET BY MOUTH ONCE DAILY 30 tablet 6  . nitroGLYCERIN (NITROSTAT) 0.4 MG SL tablet Place 0.4 mg under the tongue every 5 (five) minutes as needed for chest pain.    . pantoprazole (PROTONIX) 40 MG tablet TAKE 1 TABLET BY MOUTH EVERY DAY 30 tablet 7  . pravastatin (PRAVACHOL) 40 MG tablet TAKE 1 TABLET EVERY DAY 90 tablet 2  . triamcinolone cream (KENALOG) 0.1 % Apply 1 application topically 2 (two) times daily. 453.6 g 0   No current facility-administered medications on file prior to visit.    Allergies  Allergen Reactions  . Celebrex [Celecoxib] Rash    blisters   Social History   Social History  . Marital status: Married    Spouse name: N/A  . Number of children: N/A  . Years of education: N/A   Occupational History  . Not on file.   Social History Main Topics  . Smoking status: Current Every Day Smoker    Packs/day: 1.00    Types: Cigarettes  . Smokeless tobacco: Never Used     Comment: greater than 50 pack year history  . Alcohol use No  . Drug use: No  . Sexual activity:  Not on file   Other Topics Concern  . 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       Review of Systems  All other systems reviewed and are negative.      Objective:   Physical Exam  Cardiovascular: Normal rate, regular rhythm and normal heart sounds.   Pulmonary/Chest: Effort normal and breath sounds normal.  Skin: Rash noted.   golfball size red ring in right axilla.       Assessment & Plan:  Tick bite, initial encounter - Plan: doxycycline (VIBRA-TABS) 100 MG tablet  Treat and certainly as possible erythema migrans with doxycycline 100 mg by mouth twice a day for 10 days

## 2016-11-19 ENCOUNTER — Other Ambulatory Visit: Payer: Self-pay | Admitting: Family Medicine

## 2016-12-12 ENCOUNTER — Other Ambulatory Visit: Payer: Self-pay | Admitting: Family Medicine

## 2017-01-17 ENCOUNTER — Encounter: Payer: Medicare HMO | Admitting: Family Medicine

## 2017-01-20 ENCOUNTER — Encounter: Payer: Self-pay | Admitting: Family Medicine

## 2017-03-05 DIAGNOSIS — H02132 Senile ectropion of right lower eyelid: Secondary | ICD-10-CM | POA: Diagnosis not present

## 2017-03-05 DIAGNOSIS — H2013 Chronic iridocyclitis, bilateral: Secondary | ICD-10-CM | POA: Diagnosis not present

## 2017-03-05 DIAGNOSIS — H00025 Hordeolum internum left lower eyelid: Secondary | ICD-10-CM | POA: Diagnosis not present

## 2017-03-05 DIAGNOSIS — H02135 Senile ectropion of left lower eyelid: Secondary | ICD-10-CM | POA: Diagnosis not present

## 2017-03-17 ENCOUNTER — Encounter: Payer: Self-pay | Admitting: Cardiology

## 2017-03-17 ENCOUNTER — Ambulatory Visit (INDEPENDENT_AMBULATORY_CARE_PROVIDER_SITE_OTHER): Payer: Medicare HMO | Admitting: Cardiology

## 2017-03-17 VITALS — BP 158/62 | HR 59 | Ht 74.0 in | Wt 164.6 lb

## 2017-03-17 DIAGNOSIS — R072 Precordial pain: Secondary | ICD-10-CM

## 2017-03-17 DIAGNOSIS — E7800 Pure hypercholesterolemia, unspecified: Secondary | ICD-10-CM

## 2017-03-17 DIAGNOSIS — I2510 Atherosclerotic heart disease of native coronary artery without angina pectoris: Secondary | ICD-10-CM

## 2017-03-17 DIAGNOSIS — I10 Essential (primary) hypertension: Secondary | ICD-10-CM | POA: Diagnosis not present

## 2017-03-17 NOTE — Patient Instructions (Signed)
Medication Instructions:   NO CHANGE  Labwork:  Your physician recommends that you return for lab work SAME DAY AS STRESS TEST  Testing/Procedures:  Your physician has requested that you have a lexiscan myoview. For further information please visit [email or web address]. Please follow instruction sheet, as given.    Follow-Up:  Your physician wants you to follow-up in: [place] will receive a reminder letter in the mail two months in advance. If you don't receive a letter, please call our office to schedule the follow-up appointment.   If you need a refill on your cardiac medications before your next appointment, please call your pharmacy.

## 2017-03-17 NOTE — Progress Notes (Signed)
HPI: FU coronary artery disease, status post coronary bypassing graft. This was performed in 1998. His last catheterization in October 2008 showed a patent LIMA to the LAD and a patent saphenous vein graft to the right coronary artery. However, he did have significant left main disease that bifurcated into the circumflex. He had a drug-eluting stent to his left main at that time. Abdominal ultrasound in August of 2011 showed no aneurysm. He also has a history of SVT and atrial flutter ablation. Nuclear study July 2017 showed ejection fraction 59% and no ischemia or infarction. Carotid Dopplers February 2018 showed 40-59% bilateral stenosis. Since I last saw him patient describes occasional chest pain. It is in the left breast area. It does not radiate and there are no associated symptoms. Lasts 30 seconds to 2 minutes. It is not pleuritic, positional or exertional. Resolves spontaneously. He notes dyspnea on exertion but no orthopnea, PND or pedal edema.   Current Outpatient Prescriptions  Medication Sig Dispense Refill  . aspirin 325 MG tablet Take 325 mg by mouth daily.     Marland Kitchen glucose blood test strip Use to check FBS qd E11.9 100 each 12  . Lancets (ONETOUCH ULTRASOFT) lancets Use as instructed 100 each 12  . metFORMIN (GLUCOPHAGE) 500 MG tablet Take 1 tablet (500 mg total) by mouth 2 (two) times daily with a meal. 180 tablet 3  . metoprolol succinate (TOPROL-XL) 25 MG 24 hr tablet TAKE 1 TABLET BY MOUTH ONCE DAILY 30 tablet 6  . nitroGLYCERIN (NITROSTAT) 0.4 MG SL tablet Place 0.4 mg under the tongue every 5 (five) minutes as needed for chest pain.    . pantoprazole (PROTONIX) 40 MG tablet TAKE 1 TABLET BY MOUTH EVERY DAY 30 tablet 11  . pravastatin (PRAVACHOL) 40 MG tablet TAKE 1 TABLET EVERY DAY 90 tablet 2   No current facility-administered medications for this visit.      Past Medical History:  Diagnosis Date  . Acute venous embolism and thrombosis of unspecified deep vessels of  lower extremity   . Atrial flutter (HCC)    and SVT that is post ablation  . CAD (coronary artery disease)   . Chronic kidney disease    stones  . Dyslipidemia   . GERD (gastroesophageal reflux disease)   . HTN (hypertension)   . Shortness of breath    on exertion    Past Surgical History:  Procedure Laterality Date  . CARDIAC CATHETERIZATION     s/p cardiac catheterization and bypass surgery as well as EGD with dilation x3, left lower extremity vein stripping  . CORONARY ARTERY BYPASS GRAFT    . ESOPHAGOGASTRODUODENOSCOPY N/A 12/06/2013   Procedure: ESOPHAGOGASTRODUODENOSCOPY (EGD);  Surgeon: Lear Ng, MD;  Location: [facility];  Service: Endoscopy;  Laterality: N/A;  . left knee anthroscopy    . SECONDARY CLOSURE ARM      Social History   Social History  . Marital status: Married    Spouse name: N/A  . Number of children: N/A  . Years of education: N/A   Occupational History  . Not on file.   Social History Main Topics  . Smoking status: Current Every Day Smoker    Packs/day: 1.00    Types: Cigarettes  . Smokeless tobacco: Never Used     Comment: greater than 50 pack year history  . Alcohol use No  . Drug use: No  . Sexual activity: Not on file   Other Topics Concern  . 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     History reviewed. No pertinent family history.  ROS: no fevers or chills, productive cough, hemoptysis, dysphasia, odynophagia, melena, hematochezia, dysuria, hematuria, rash, seizure activity, orthopnea, PND, pedal edema, claudication. Remaining systems are negative.  Physical Exam: Well-developed well-nourished in no acute distress.  Skin is warm and dry.  HEENT is normal.  Neck is supple.  Chest is clear to auscultation with normal expansion.  Cardiovascular exam is regular rate and rhythm.    Abdominal exam nontender or distended. No masses palpated. Extremities show no edema. neuro grossly intact  ECG- sinus rhythm at a rate of 59. Nonspecific ST changes. personally reviewed  A/P  1 coronary artery disease status post coronary artery bypass graft-infarction. Continue medical therapy including aspirin and statin. Patient is describing occasional chest pain which is somewhat atypical. His electrocardiogram shows no ST changes. Plan repeat functional study for risk stratification.   2 carotid artery disease-plan follow-up carotid Dopplers February 2019.  3 hyperlipidemia-continue statin. Check lipids and liver. He has not tolerated high-dose statin previously.  4 hypertension-blood pressure is elevated. However he states typically controlled at home. Continue present medications and follow.  5 tobacco abuse-patient counseled on discontinuing.  6 history of atrial flutter ablation-patient remains in sinus rhythm.  Kirk Ruths, MD

## 2017-03-20 ENCOUNTER — Telehealth (HOSPITAL_COMMUNITY): Payer: Self-pay

## 2017-03-20 NOTE — Telephone Encounter (Signed)
Encounter complete. 

## 2017-03-24 ENCOUNTER — Ambulatory Visit (INDEPENDENT_AMBULATORY_CARE_PROVIDER_SITE_OTHER): Payer: Medicare HMO | Admitting: Family Medicine

## 2017-03-24 ENCOUNTER — Encounter: Payer: Self-pay | Admitting: Family Medicine

## 2017-03-24 VITALS — BP 132/68 | HR 70 | Temp 97.8°F | Resp 16 | Ht 74.0 in | Wt 163.0 lb

## 2017-03-24 DIAGNOSIS — L209 Atopic dermatitis, unspecified: Secondary | ICD-10-CM | POA: Diagnosis not present

## 2017-03-24 MED ORDER — PREDNISONE 20 MG PO TABS: ORAL_TABLET | ORAL | 0 refills | Status: DC

## 2017-03-24 NOTE — Progress Notes (Signed)
Subjective:    Patient ID: [name], male    DOB: 1934/07/25, 81 y.o.   MRN: [id number]  HPI Patient has a history of atopic dermatitis. He has been tried on numerous topical corticosteroid creams including triamcinolone, Elocon, and Lidex. Patient states these had no benefit from either of these creams. He now has a serious rash on the dorsums of both forearms that appears almost dyshidrotic. It is numerous erythematous papules coalescing into a large plaque encompassing the majority of his dorsal forearm left greater than right. He has similar rash on the dorsums of both shins. Past Medical History:  Diagnosis Date  . Acute venous embolism and thrombosis of unspecified deep vessels of lower extremity   . Atrial flutter (HCC)    and SVT that is post ablation  . CAD (coronary artery disease)   . Chronic kidney disease    stones  . Dyslipidemia   . GERD (gastroesophageal reflux disease)   . HTN (hypertension)   . Shortness of breath    on exertion   Past Surgical History:  Procedure Laterality Date  . CARDIAC CATHETERIZATION     s/p cardiac catheterization and bypass surgery as well as EGD with dilation x3, left lower extremity vein stripping  . CORONARY ARTERY BYPASS GRAFT    . ESOPHAGOGASTRODUODENOSCOPY N/A 12/06/2013   Procedure: ESOPHAGOGASTRODUODENOSCOPY (EGD);  Surgeon: Lear Ng, MD;  Location: [facility];  Service: Endoscopy;  Laterality: N/A;  . left knee anthroscopy    . SECONDARY CLOSURE ARM     Current Outpatient Prescriptions on File Prior to Visit  Medication Sig Dispense Refill  . aspirin 325 MG tablet Take 325 mg by mouth daily.     Marland Kitchen glucose blood test strip Use to check FBS qd E11.9 100 each 12  . Lancets (ONETOUCH ULTRASOFT) lancets Use as instructed 100 each 12  . metFORMIN (GLUCOPHAGE) 500 MG tablet Take 1 tablet (500 mg total) by mouth 2 (two) times daily with a meal. 180 tablet 3  . metoprolol succinate (TOPROL-XL) 25 MG 24 hr tablet TAKE  1 TABLET BY MOUTH ONCE DAILY 30 tablet 6  . nitroGLYCERIN (NITROSTAT) 0.4 MG SL tablet Place 0.4 mg under the tongue every 5 (five) minutes as needed for chest pain.    . pantoprazole (PROTONIX) 40 MG tablet TAKE 1 TABLET BY MOUTH EVERY DAY 30 tablet 11  . pravastatin (PRAVACHOL) 40 MG tablet TAKE 1 TABLET EVERY DAY 90 tablet 2   No current facility-administered medications on file prior to visit.    Allergies  Allergen Reactions  . Celebrex [Celecoxib] Rash    blisters   Social History   Social History  . Marital status: Married    Spouse name: N/A  . Number of children: N/A  . Years of education: N/A   Occupational History  . Not on file.   Social History Main Topics  . Smoking status: Current Every Day Smoker    Packs/day: 1.00    Types: Cigarettes  . Smokeless tobacco: Never Used     Comment: greater than 50 pack year history  . Alcohol use No  . Drug use: No  . Sexual activity: Not on file   Other Topics Concern  . 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       Review of Systems  All  other systems reviewed and are negative.      Objective:   Physical Exam  Cardiovascular: Normal rate, regular rhythm and normal heart sounds.   Pulmonary/Chest: Effort normal and breath sounds normal. No respiratory distress. He has no wheezes. He has no rales.  Skin: Rash noted. There is erythema.     Vitals reviewed.         Assessment & Plan:  Atopic dermatitis, unspecified type  Use prednisone taper pack to gain control as the rash is severe at the present time. Discontinue topical corticosteroid creams and replaced with eucrisa applied once daily as a new control medication. Samples were given

## 2017-03-25 ENCOUNTER — Ambulatory Visit (HOSPITAL_COMMUNITY)
Admission: RE | Admit: 2017-03-25 | Discharge: 2017-03-25 | Disposition: A | Discharge status: Home / Self Care | Payer: Medicare HMO | Source: Ambulatory Visit | Attending: Cardiovascular Disease | Admitting: Cardiovascular Disease

## 2017-03-25 DIAGNOSIS — R072 Precordial pain: Secondary | ICD-10-CM | POA: Insufficient documentation

## 2017-03-25 LAB — MYOCARDIAL PERFUSION IMAGING
CHL CUP RESTING HR STRESS: 54 {beats}/min
CSEPPHR: 76 {beats}/min
LV sys vol: 42 mL
LVDIAVOL: 89 mL (ref 62–150)
SDS: 1
SRS: 2
SSS: 3
TID: 1.19

## 2017-03-25 MED ORDER — TECHNETIUM TC 99M TETROFOSMIN IV KIT
10.2000 | PACK | Freq: Once | INTRAVENOUS | Status: AC | PRN
  Administered 2017-03-25: 10.2 via INTRAVENOUS
  Filled 2017-03-25: qty 11

## 2017-03-25 MED ORDER — TECHNETIUM TC 99M TETROFOSMIN IV KIT
31.1000 | PACK | Freq: Once | INTRAVENOUS | Status: AC | PRN
  Administered 2017-03-25: 31.1 via INTRAVENOUS
  Filled 2017-03-25: qty 32

## 2017-03-25 MED ORDER — REGADENOSON 0.4 MG/5ML IV SOLN
0.4000 mg | Freq: Once | INTRAVENOUS | Status: AC
  Administered 2017-03-25: 0.4 mg via INTRAVENOUS

## 2017-03-31 DIAGNOSIS — R69 Illness, unspecified: Secondary | ICD-10-CM | POA: Diagnosis not present

## 2017-04-04 DIAGNOSIS — H02032 Senile entropion of right lower eyelid: Secondary | ICD-10-CM | POA: Diagnosis not present

## 2017-04-04 DIAGNOSIS — Z9842 Cataract extraction status, left eye: Secondary | ICD-10-CM | POA: Diagnosis not present

## 2017-04-04 DIAGNOSIS — H2013 Chronic iridocyclitis, bilateral: Secondary | ICD-10-CM | POA: Diagnosis not present

## 2017-04-04 DIAGNOSIS — H16213 Exposure keratoconjunctivitis, bilateral: Secondary | ICD-10-CM | POA: Diagnosis not present

## 2017-04-04 DIAGNOSIS — H279 Unspecified disorder of lens: Secondary | ICD-10-CM | POA: Diagnosis not present

## 2017-04-04 DIAGNOSIS — Z9841 Cataract extraction status, right eye: Secondary | ICD-10-CM | POA: Diagnosis not present

## 2017-04-04 DIAGNOSIS — E119 Type 2 diabetes mellitus without complications: Secondary | ICD-10-CM | POA: Diagnosis not present

## 2017-04-04 DIAGNOSIS — H02135 Senile ectropion of left lower eyelid: Secondary | ICD-10-CM | POA: Diagnosis not present

## 2017-04-04 LAB — HM DIABETES EYE EXAM

## 2017-04-14 ENCOUNTER — Encounter: Payer: Self-pay | Admitting: *Deleted

## 2017-04-17 ENCOUNTER — Other Ambulatory Visit: Payer: Self-pay | Admitting: Cardiology

## 2017-04-17 ENCOUNTER — Encounter: Payer: Self-pay | Admitting: Family Medicine

## 2017-05-07 DIAGNOSIS — L0889 Other specified local infections of the skin and subcutaneous tissue: Secondary | ICD-10-CM | POA: Diagnosis not present

## 2017-05-07 DIAGNOSIS — L2089 Other atopic dermatitis: Secondary | ICD-10-CM | POA: Diagnosis not present

## 2017-05-07 DIAGNOSIS — L0109 Other impetigo: Secondary | ICD-10-CM | POA: Diagnosis not present

## 2017-05-12 DIAGNOSIS — E7800 Pure hypercholesterolemia, unspecified: Secondary | ICD-10-CM | POA: Diagnosis not present

## 2017-05-12 LAB — HEPATIC FUNCTION PANEL
ALT: 10 IU/L (ref 0–44)
AST: 14 IU/L (ref 0–40)
Albumin: 3.7 g/dL (ref 3.5–4.7)
Alkaline Phosphatase: 68 IU/L (ref 39–117)
BILIRUBIN, DIRECT: 0.14 mg/dL (ref 0.00–0.40)
Bilirubin Total: 0.4 mg/dL (ref 0.0–1.2)
TOTAL PROTEIN: 7.3 g/dL (ref 6.0–8.5)

## 2017-05-12 LAB — LIPID PANEL
CHOL/HDL RATIO: 3.6 ratio (ref 0.0–5.0)
Cholesterol, Total: 120 mg/dL (ref 100–199)
HDL: 33 mg/dL — AB (ref >39)
LDL Calculated: 70 mg/dL (ref 0–99)
Triglycerides: 86 mg/dL (ref 0–149)
VLDL Cholesterol Cal: 17 mg/dL (ref 5–40)

## 2017-05-13 ENCOUNTER — Encounter: Payer: Self-pay | Admitting: *Deleted

## 2017-05-20 DIAGNOSIS — H9110 Presbycusis, unspecified ear: Secondary | ICD-10-CM | POA: Diagnosis not present

## 2017-05-20 DIAGNOSIS — R69 Illness, unspecified: Secondary | ICD-10-CM | POA: Diagnosis not present

## 2017-05-20 DIAGNOSIS — K08109 Complete loss of teeth, unspecified cause, unspecified class: Secondary | ICD-10-CM | POA: Diagnosis not present

## 2017-05-20 DIAGNOSIS — Z0000 Encounter for general adult medical examination without abnormal findings: Secondary | ICD-10-CM | POA: Diagnosis not present

## 2017-05-20 DIAGNOSIS — M25571 Pain in right ankle and joints of right foot: Secondary | ICD-10-CM | POA: Diagnosis not present

## 2017-05-20 DIAGNOSIS — E789 Disorder of lipoprotein metabolism, unspecified: Secondary | ICD-10-CM | POA: Diagnosis not present

## 2017-05-20 DIAGNOSIS — K219 Gastro-esophageal reflux disease without esophagitis: Secondary | ICD-10-CM | POA: Diagnosis not present

## 2017-05-20 DIAGNOSIS — I10 Essential (primary) hypertension: Secondary | ICD-10-CM | POA: Diagnosis not present

## 2017-05-20 DIAGNOSIS — E1122 Type 2 diabetes mellitus with diabetic chronic kidney disease: Secondary | ICD-10-CM | POA: Diagnosis not present

## 2017-05-20 DIAGNOSIS — H21562 Pupillary abnormality, left eye: Secondary | ICD-10-CM | POA: Diagnosis not present

## 2017-05-23 ENCOUNTER — Other Ambulatory Visit: Payer: Self-pay | Admitting: Cardiology

## 2017-05-23 DIAGNOSIS — E785 Hyperlipidemia, unspecified: Secondary | ICD-10-CM

## 2017-06-18 ENCOUNTER — Ambulatory Visit: Payer: Medicare HMO | Admitting: Cardiology

## 2017-07-17 ENCOUNTER — Other Ambulatory Visit: Payer: Self-pay | Admitting: *Deleted

## 2017-07-17 DIAGNOSIS — I679 Cerebrovascular disease, unspecified: Secondary | ICD-10-CM

## 2017-07-28 ENCOUNTER — Encounter: Payer: Self-pay | Admitting: Physician Assistant

## 2017-07-28 ENCOUNTER — Other Ambulatory Visit: Payer: Self-pay

## 2017-07-28 ENCOUNTER — Ambulatory Visit (INDEPENDENT_AMBULATORY_CARE_PROVIDER_SITE_OTHER): Payer: Medicare HMO | Admitting: Physician Assistant

## 2017-07-28 VITALS — BP 124/69 | HR 62 | Temp 98.1°F | Resp 14 | Wt 142.0 lb

## 2017-07-28 DIAGNOSIS — E119 Type 2 diabetes mellitus without complications: Secondary | ICD-10-CM

## 2017-07-28 DIAGNOSIS — J029 Acute pharyngitis, unspecified: Secondary | ICD-10-CM

## 2017-07-28 DIAGNOSIS — I2510 Atherosclerotic heart disease of native coronary artery without angina pectoris: Secondary | ICD-10-CM | POA: Diagnosis not present

## 2017-07-28 DIAGNOSIS — R531 Weakness: Secondary | ICD-10-CM | POA: Diagnosis not present

## 2017-07-28 DIAGNOSIS — R112 Nausea with vomiting, unspecified: Secondary | ICD-10-CM | POA: Diagnosis not present

## 2017-07-28 DIAGNOSIS — I10 Essential (primary) hypertension: Secondary | ICD-10-CM | POA: Diagnosis not present

## 2017-07-28 DIAGNOSIS — B370 Candidal stomatitis: Secondary | ICD-10-CM

## 2017-07-28 MED ORDER — NYSTATIN 100000 UNIT/ML MT SUSP: 5.0000 mL | Freq: Four times a day (QID) | OROMUCOSAL | 0 refills | Status: DC

## 2017-07-28 NOTE — Progress Notes (Signed)
Patient ID: [name] MRN: [id number], DOB: May 21, 1935, 83 y.o. Date of Encounter: @DATE @  Chief Complaint:  Chief Complaint  Patient presents with  . Fatigue    x3days  . Emesis  . Headache    HPI: 83 y.o. year old male  presents with above.   Wife and granddaughter accompany him for visit. They do most of the talking as patient is very hard of hearing.  They report that Friday morning ( 07/25/2017) he was out and about like usual.  They think that later on Friday he started to feel a little tired and weak and did not do as much. They report that on Saturday (07/26/2017) he was feeling weak and stayed in bed most of the day and would "have to hold to things to walk ".   They state that he is usually very active and out and about. They state that on Saturday evening he ate chicken, gravy, potatoes. Sunday morning he drove and went to church. They report that Sunday night, which was last night, he vomited 3 times during the night. They report he has had no diarrhea. They report that his last episode of vomiting was at about 6 AM this morning.  It is now ~ 12:30 PM.  He has not complained of any abdominal pain.  He reports that he has been complaining of some sore throat and that he was complaining of that prior to the onset of the vomiting. No other specific symptoms or concerns to address at this time.   Reviewed that his temperature is normal at 98.3.  His blood pressure is stable at 124/69.   Past Medical History:  Diagnosis Date  . Acute venous embolism and thrombosis of unspecified deep vessels of lower extremity   . Atrial flutter (HCC)    and SVT that is post ablation  . CAD (coronary artery disease)   . Chronic kidney disease    stones  . Dyslipidemia   . GERD (gastroesophageal reflux disease)   . HTN (hypertension)   . Shortness of breath    on exertion     Home Meds: Outpatient Medications Prior to Visit  Medication Sig Dispense Refill  . aspirin  325 MG tablet Take 325 mg by mouth daily.     Marland Kitchen glucose blood test strip Use to check FBS qd E11.9 100 each 12  . Lancets (ONETOUCH ULTRASOFT) lancets Use as instructed 100 each 12  . metFORMIN (GLUCOPHAGE) 500 MG tablet Take 1 tablet (500 mg total) by mouth 2 (two) times daily with a meal. 180 tablet 3  . metoprolol succinate (TOPROL-XL) 25 MG 24 hr tablet TAKE 1 TABLET BY MOUTH ONCE DAILY 30 tablet 6  . nitroGLYCERIN (NITROSTAT) 0.4 MG SL tablet Place 0.4 mg under the tongue every 5 (five) minutes as needed for chest pain.    . pantoprazole (PROTONIX) 40 MG tablet TAKE 1 TABLET BY MOUTH EVERY DAY 30 tablet 11  . pravastatin (PRAVACHOL) 40 MG tablet TAKE 1 TABLET BY MOUTH EVERY DAY 90 tablet 2  . predniSONE (DELTASONE) 20 MG tablet 3 tabs poqday 1-2, 2 tabs poqday 3-4, 1 tab poqday 5-6 12 tablet 0   No facility-administered medications prior to visit.     Allergies:  Allergies  Allergen Reactions  . Celebrex [Celecoxib] Rash    blisters    Social History   Socioeconomic History  . Marital status: Married    Spouse name: Not on file  . Number of children:  Not on file  . Years of education: Not on file  . Highest education level: Not on file  Social Needs  . Financial resource strain: Not on file  . Food insecurity - worry: Not on file  . Food insecurity - inability: Not on file  . Transportation needs - medical: Not on file  . Transportation needs - non-medical: Not on file  Occupational History  . Not on file  Tobacco Use  . Smoking status: Current Every Day Smoker    Packs/day: 1.00    Types: Cigarettes  . Smokeless tobacco: Never Used  . Tobacco comment: greater than 50 pack year history  Substance and Sexual Activity  . Alcohol use: No  . Drug use: No  . Sexual activity: Not on file  Other Topics Concern  . 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     History reviewed. No pertinent family history.   Review of Systems:  See HPI for pertinent ROS. All other ROS negative.    Physical Exam: Blood pressure 124/69, pulse 62, temperature 98.1 F (36.7 C), temperature source Oral, resp. rate 14, weight 64.4 kg (142 lb), SpO2 96 %., Body mass index is 18.23 kg/m. General: Thin WM. Appears in no acute distress. Head: Normocephalic, atraumatic.  Oral cavity moist, posterior pharynx is beefy red erythema with areas of exudate that has appearance of "cottage cheese". Tongue with patches of coating that does not scrape off - using tongue depresser.  Neck: Supple. No thyromegaly. No lymphadenopathy. Lungs: Clear bilaterally to auscultation without wheezes, rales, or rhonchi. Breathing is unlabored. Heart: RRR with S1 S2. No murmurs, rubs, or gallops. Abdomen: Soft, non-tender, non-distended with normoactive bowel sounds. No hepatomegaly. No rebound/guarding. No obvious abdominal masses.  Some tenderness with palpation of the epigastric region but no other areas of tenderness with palpation. Musculoskeletal:  Strength and tone normal for age. Extremities/Skin: Warm and dry.  Neuro: Alert and oriented X 3. Moves all extremities spontaneously. Gait is normal. CNII-XII grossly in tact. Psych:  Responds to questions appropriately with a normal affect.   Results for orders placed or performed in visit on 07/28/17  STREP GROUP A AG, W/REFLEX TO CULT  Result Value Ref Range   Streptococcus, Group A Screen (Direct) NONE DETECTED      ASSESSMENT AND PLAN:  83 y.o. year old male with   Reviewed that his temperature is normal at 98.3.  His blood pressure is stable at 124/69. Given his vomiting and possible dehydration, I reviewed to see when he last had renal function.  Last creatinine in the chart is from way back 05/2016. Also reviewed that he is on medication for diabetes but has had no recent lab to monitor this or any of his  other listed medicines.  On exam he definitely has thrush.  Suspect that this is the cause of his sore throat and may even be the cause of his vomiting. However, he definitely needs labs as he has not had labs in a long time and is way overdue for routine visit and labs. Also will check labs to further evaluate vomiting and possible dehydration.   Explained to them proper use of the nystatin.  He is to swish and swallow this after he has had his food/drink 3 times through the day and then again at bedtime. He is to  stay with a clear liquid diet until vomiting has been resolved for at least 24 hours.  He is to drink small frequent sips of fluids to prevent dehydration.  Once vomiting has ceased for at least 24 hours then can gradually advance to bland diet. Will follow-up with them once I get lab results As well discussed that he needs to schedule routine follow-up visit with Dr. Dennard Schaumann to follow-up his routine medical problems.  1. Sore throat - STREP GROUP A AG, W/REFLEX TO CULT - CBC with Differential/Platelet  2. Non-intractable vomiting with nausea, unspecified vomiting type - CBC with Differential/Platelet - COMPLETE METABOLIC PANEL WITH GFR  3. Oral candidiasis - nystatin (MYCOSTATIN) 100000 UNIT/ML suspension; Take 5 mLs (500,000 Units total) by mouth 4 (four) times daily.  Dispense: 60 mL; Refill: 0  4. Essential hypertension - COMPLETE METABOLIC PANEL WITH GFR  5. Atherosclerosis of native coronary artery of native heart without angina pectoris  6. Weakness - CBC with Differential/Platelet - COMPLETE METABOLIC PANEL WITH GFR  7. Controlled type 2 diabetes mellitus without complication, without long-term current use of insulin (HCC) - COMPLETE METABOLIC PANEL WITH GFR - Hemoglobin A1c   Signed, [address] [place], [place], [facility] 07/28/2017 12:53 PM

## 2017-07-29 ENCOUNTER — Inpatient Hospital Stay (HOSPITAL_COMMUNITY)
Admission: EM | Admit: 2017-07-29 | Discharge: 2017-08-01 | DRG: 638 | Disposition: A | Discharge status: Home Health | Payer: Medicare HMO | Attending: Internal Medicine | Admitting: Internal Medicine

## 2017-07-29 ENCOUNTER — Encounter (HOSPITAL_COMMUNITY): Payer: Self-pay

## 2017-07-29 DIAGNOSIS — I2510 Atherosclerotic heart disease of native coronary artery without angina pectoris: Secondary | ICD-10-CM | POA: Diagnosis present

## 2017-07-29 DIAGNOSIS — F17210 Nicotine dependence, cigarettes, uncomplicated: Secondary | ICD-10-CM | POA: Diagnosis present

## 2017-07-29 DIAGNOSIS — R531 Weakness: Secondary | ICD-10-CM | POA: Diagnosis not present

## 2017-07-29 DIAGNOSIS — Z951 Presence of aortocoronary bypass graft: Secondary | ICD-10-CM

## 2017-07-29 DIAGNOSIS — I4892 Unspecified atrial flutter: Secondary | ICD-10-CM | POA: Diagnosis not present

## 2017-07-29 DIAGNOSIS — K219 Gastro-esophageal reflux disease without esophagitis: Secondary | ICD-10-CM | POA: Diagnosis present

## 2017-07-29 DIAGNOSIS — Z7982 Long term (current) use of aspirin: Secondary | ICD-10-CM | POA: Diagnosis not present

## 2017-07-29 DIAGNOSIS — R1310 Dysphagia, unspecified: Secondary | ICD-10-CM

## 2017-07-29 DIAGNOSIS — E785 Hyperlipidemia, unspecified: Secondary | ICD-10-CM | POA: Diagnosis present

## 2017-07-29 DIAGNOSIS — R69 Illness, unspecified: Secondary | ICD-10-CM | POA: Diagnosis not present

## 2017-07-29 DIAGNOSIS — Z79899 Other long term (current) drug therapy: Secondary | ICD-10-CM

## 2017-07-29 DIAGNOSIS — Z888 Allergy status to other drugs, medicaments and biological substances status: Secondary | ICD-10-CM

## 2017-07-29 DIAGNOSIS — E1110 Type 2 diabetes mellitus with ketoacidosis without coma: Secondary | ICD-10-CM | POA: Diagnosis not present

## 2017-07-29 DIAGNOSIS — E860 Dehydration: Secondary | ICD-10-CM | POA: Diagnosis not present

## 2017-07-29 DIAGNOSIS — I10 Essential (primary) hypertension: Secondary | ICD-10-CM | POA: Diagnosis not present

## 2017-07-29 DIAGNOSIS — E871 Hypo-osmolality and hyponatremia: Secondary | ICD-10-CM | POA: Diagnosis present

## 2017-07-29 DIAGNOSIS — Z7984 Long term (current) use of oral hypoglycemic drugs: Secondary | ICD-10-CM

## 2017-07-29 DIAGNOSIS — R112 Nausea with vomiting, unspecified: Secondary | ICD-10-CM | POA: Diagnosis not present

## 2017-07-29 DIAGNOSIS — Z794 Long term (current) use of insulin: Secondary | ICD-10-CM

## 2017-07-29 DIAGNOSIS — Z86718 Personal history of other venous thrombosis and embolism: Secondary | ICD-10-CM

## 2017-07-29 DIAGNOSIS — E1010 Type 1 diabetes mellitus with ketoacidosis without coma: Secondary | ICD-10-CM

## 2017-07-29 LAB — COMPREHENSIVE METABOLIC PANEL
ALBUMIN: 3.1 g/dL — AB (ref 3.5–5.0)
ALT: 13 U/L — AB (ref 17–63)
ANION GAP: 11 (ref 5–15)
AST: 17 U/L (ref 15–41)
Alkaline Phosphatase: 81 U/L (ref 38–126)
BUN: 24 mg/dL — ABNORMAL HIGH (ref 6–20)
CHLORIDE: 90 mmol/L — AB (ref 101–111)
CO2: 23 mmol/L (ref 22–32)
CREATININE: 1.44 mg/dL — AB (ref 0.61–1.24)
Calcium: 8.6 mg/dL — ABNORMAL LOW (ref 8.9–10.3)
GFR calc non Af Amer: 44 mL/min — ABNORMAL LOW (ref >60)
GFR, EST AFRICAN AMERICAN: 51 mL/min — AB (ref >60)
GLUCOSE: 602 mg/dL — AB (ref 65–99)
Potassium: 5 mmol/L (ref 3.5–5.1)
SODIUM: 124 mmol/L — AB (ref 135–145)
Total Bilirubin: 1 mg/dL (ref 0.3–1.2)
Total Protein: 7.1 g/dL (ref 6.5–8.1)

## 2017-07-29 LAB — COMPLETE METABOLIC PANEL WITH GFR
AG RATIO: 1.1 (calc) (ref 1.0–2.5)
ALBUMIN MSPROF: 4.1 g/dL (ref 3.6–5.1)
ALT: 11 U/L (ref 9–46)
AST: 13 U/L (ref 10–35)
Alkaline phosphatase (APISO): 133 U/L — ABNORMAL HIGH (ref 40–115)
BUN / CREAT RATIO: 14 (calc) (ref 6–22)
BUN: 19 mg/dL (ref 7–25)
CO2: 26 mmol/L (ref 20–32)
CREATININE: 1.38 mg/dL — AB (ref 0.70–1.11)
Calcium: 10.1 mg/dL (ref 8.6–10.3)
Chloride: 87 mmol/L — ABNORMAL LOW (ref 98–110)
GFR, EST AFRICAN AMERICAN: 56 mL/min/{1.73_m2} — AB (ref >=60)
GFR, EST NON AFRICAN AMERICAN: 48 mL/min/{1.73_m2} — AB (ref >=60)
GLOBULIN: 3.6 g/dL (ref 1.9–3.7)
Glucose, Bld: 720 mg/dL (ref 65–99)
POTASSIUM: 5.5 mmol/L — AB (ref 3.5–5.3)
Sodium: 124 mmol/L — ABNORMAL LOW (ref 135–146)
TOTAL PROTEIN: 7.7 g/dL (ref 6.1–8.1)
Total Bilirubin: 0.8 mg/dL (ref 0.2–1.2)

## 2017-07-29 LAB — CBG MONITORING, ED
GLUCOSE-CAPILLARY: 407 mg/dL — AB (ref 65–99)
Glucose-Capillary: >600 mg/dL (ref 65–99)
Glucose-Capillary: 542 mg/dL (ref 65–99)

## 2017-07-29 LAB — URINALYSIS, ROUTINE W REFLEX MICROSCOPIC
BACTERIA UA: NONE SEEN
BILIRUBIN URINE: NEGATIVE
Glucose, UA: >=500 mg/dL — AB
Hgb urine dipstick: NEGATIVE
Ketones, ur: 5 mg/dL — AB
Leukocytes, UA: NEGATIVE
NITRITE: NEGATIVE
Protein, ur: NEGATIVE mg/dL
SQUAMOUS EPITHELIAL / LPF: NONE SEEN
Specific Gravity, Urine: 1.026 (ref 1.005–1.030)
pH: 5 (ref 5.0–8.0)

## 2017-07-29 LAB — CBC WITH DIFFERENTIAL/PLATELET
BASOS ABS: 45 {cells}/uL (ref 0–200)
Basophils Relative: 0.4 %
EOS ABS: 34 {cells}/uL (ref 15–500)
Eosinophils Relative: 0.3 %
HCT: 45.5 % (ref 38.5–50.0)
HEMOGLOBIN: 14.7 g/dL (ref 13.2–17.1)
Lymphs Abs: 1672 cells/uL (ref 850–3900)
MCH: 29 pg (ref 27.0–33.0)
MCHC: 32.3 g/dL (ref 32.0–36.0)
MCV: 89.7 fL (ref 80.0–100.0)
MONOS PCT: 10.4 %
MPV: 11.9 fL (ref 7.5–12.5)
NEUTROS ABS: 8373 {cells}/uL — AB (ref 1500–7800)
Neutrophils Relative %: 74.1 %
Platelets: 169 10*3/uL (ref 140–400)
RBC: 5.07 10*6/uL (ref 4.20–5.80)
RDW: 13.4 % (ref 11.0–15.0)
TOTAL LYMPHOCYTE: 14.8 %
WBC mixed population: 1175 cells/uL — ABNORMAL HIGH (ref 200–950)
WBC: 11.3 10*3/uL — ABNORMAL HIGH (ref 3.8–10.8)

## 2017-07-29 LAB — I-STAT VENOUS BLOOD GAS, ED
Acid-base deficit: 1 mmol/L (ref 0.0–2.0)
BICARBONATE: 25.4 mmol/L (ref 20.0–28.0)
O2 Saturation: 21 %
PO2 VEN: 16 mmHg — AB (ref 32.0–45.0)
TCO2: 27 mmol/L (ref 22–32)
pCO2, Ven: 45.3 mmHg (ref 44.0–60.0)
pH, Ven: 7.356 (ref 7.250–7.430)

## 2017-07-29 LAB — HEMOGLOBIN A1C: Hgb A1c MFr Bld: >14 % of total Hgb — ABNORMAL HIGH (ref <5.7)

## 2017-07-29 LAB — CBC
HEMATOCRIT: 42.1 % (ref 39.0–52.0)
HEMOGLOBIN: 14.5 g/dL (ref 13.0–17.0)
MCH: 30 pg (ref 26.0–34.0)
MCHC: 34.4 g/dL (ref 30.0–36.0)
MCV: 87 fL (ref 78.0–100.0)
Platelets: 198 10*3/uL (ref 150–400)
RBC: 4.84 MIL/uL (ref 4.22–5.81)
RDW: 14.6 % (ref 11.5–15.5)
WBC: 12.6 10*3/uL — ABNORMAL HIGH (ref 4.0–10.5)

## 2017-07-29 LAB — LIPASE, BLOOD: Lipase: 20 U/L (ref 11–51)

## 2017-07-29 MED ORDER — SODIUM CHLORIDE 0.9 % IV BOLUS (SEPSIS)
1000.0000 mL | Freq: Once | INTRAVENOUS | Status: AC
  Administered 2017-07-29: 1000 mL via INTRAVENOUS

## 2017-07-29 MED ORDER — DEXTROSE-NACL 5-0.45 % IV SOLN: INTRAVENOUS | Status: DC

## 2017-07-29 MED ORDER — SODIUM CHLORIDE 0.9 % IV SOLN
INTRAVENOUS | Status: DC
  Administered 2017-07-29: 5.4 [IU]/h via INTRAVENOUS
  Filled 2017-07-29: qty 1

## 2017-07-29 NOTE — ED Notes (Signed)
Pt CBG reads "HI >600" RN [employer].

## 2017-07-29 NOTE — ED Notes (Signed)
Called main lab over pts lab not running yet. Will start insulin drip when CMP is resulted. MD and Charge have been notified and approved.

## 2017-07-29 NOTE — ED Provider Notes (Signed)
[facility] EMERGENCY DEPARTMENT Provider Note   CSN: [id number] Arrival date & time: 07/29/17  1335     History   Chief Complaint Chief Complaint  Patient presents with  . Weakness    HPI [name] is a 83 y.o. male.  HPI Presents due to weakness, nausea, vomiting. He is here with family members who assist with the HPI. They note that the patient has multiple medical issues, notably insulin-dependent diabetes. Over the past 4 or 5 days the patient has had progressive weakness, as well as anorexia, nausea, several episodes of vomiting. Patient has not taken medication in several days secondary to his nausea. No reported fall, fever, diarrhea, incontinence. Patient denies physical pain. She was well prior to the onset of illness.  Past Medical History:  Diagnosis Date  . Acute venous embolism and thrombosis of unspecified deep vessels of lower extremity   . Atrial flutter (HCC)    and SVT that is post ablation  . CAD (coronary artery disease)   . Chronic kidney disease    stones  . Dyslipidemia   . GERD (gastroesophageal reflux disease)   . HTN (hypertension)   . Shortness of breath    on exertion    Patient Active Problem List   Diagnosis Date Noted  . Lung nodule 01/13/2015  . Food impaction of esophagus 12/06/2013  . Dysphagia 12/06/2013  . Cerebrovascular disease 11/14/2011  . Fatigue 01/08/2011  . Bruit 01/08/2011  . TOBACCO ABUSE 01/11/2010  . ABDOMINAL BRUIT 01/11/2010  . DYSLIPIDEMIA 11/19/2008  . Essential hypertension 11/19/2008  . Coronary atherosclerosis 11/19/2008  . ATRIAL FLUTTER 11/19/2008  . DVT 11/19/2008    Past Surgical History:  Procedure Laterality Date  . CARDIAC CATHETERIZATION     s/p cardiac catheterization and bypass surgery as well as EGD with dilation x3, left lower extremity vein stripping  . CORONARY ARTERY BYPASS GRAFT    . ESOPHAGOGASTRODUODENOSCOPY N/A 12/06/2013   Procedure:  ESOPHAGOGASTRODUODENOSCOPY (EGD);  Surgeon: Lear Ng, MD;  Location: [facility];  Service: Endoscopy;  Laterality: N/A;  . left knee anthroscopy    . SECONDARY CLOSURE ARM         Home Medications    Prior to Admission medications   Medication Sig Start Date End Date Taking? Authorizing Provider  aspirin 325 MG tablet Take 325 mg by mouth daily.     [provider]  metFORMIN (GLUCOPHAGE) 500 MG tablet Take 1 tablet (500 mg total) by mouth 2 (two) times daily with a meal. 08/09/16   Susy Frizzle, MD  metoprolol succinate (TOPROL-XL) 25 MG 24 hr tablet TAKE 1 TABLET BY MOUTH ONCE DAILY 04/17/17   Lelon Perla, MD  nitroGLYCERIN (NITROSTAT) 0.4 MG SL tablet Place 0.4 mg under the tongue every 5 (five) minutes as needed for chest pain. 11/14/11   Lelon Perla, MD  nystatin (MYCOSTATIN) 100000 UNIT/ML suspension Take 5 mLs (500,000 Units total) by mouth 4 (four) times daily. 07/28/17   Dena Billet B, PA-C  pantoprazole (PROTONIX) 40 MG tablet TAKE 1 TABLET BY MOUTH EVERY DAY 11/19/16   Susy Frizzle, MD  pravastatin (PRAVACHOL) 40 MG tablet TAKE 1 TABLET BY MOUTH EVERY DAY 05/23/17   Lelon Perla, MD    Family History History reviewed. No pertinent family history.  Social History Social History   Tobacco Use  . Smoking status: Current Every Day Smoker    Packs/day: 1.00    Types: Cigarettes  . Smokeless  tobacco: Never Used  . Tobacco comment: greater than 50 pack year history  Substance Use Topics  . Alcohol use: No  . Drug use: No     Allergies   Celebrex [celecoxib]   Review of Systems Review of Systems  Constitutional:       Per HPI, otherwise negative  HENT:       Per HPI, otherwise negative  Respiratory:       Per HPI, otherwise negative  Cardiovascular:       Per HPI, otherwise negative  Gastrointestinal: Positive for nausea and vomiting.  Endocrine:       Negative aside from HPI  Genitourinary:       Neg aside from  HPI   Musculoskeletal:       Per HPI, otherwise negative  Skin: Negative.   Neurological: Positive for weakness. Negative for syncope.     Physical Exam Updated Vital Signs BP 107/67   Pulse 76   Temp (!) 97.3 F (36.3 C) (Oral)   Resp 20   SpO2 96%   Physical Exam  Constitutional: He is oriented to person, place, and time. He has a sickly appearance. No distress.  HENT:  Head: Normocephalic and atraumatic.  Eyes: Conjunctivae and EOM are normal.  Cardiovascular: Normal rate and regular rhythm.  Pulmonary/Chest: Effort normal. No stridor. No respiratory distress.  Abdominal: He exhibits no distension.  Musculoskeletal: He exhibits no edema.  Neurological: He is alert and oriented to person, place, and time.  Skin: Skin is warm and dry.  Psychiatric: He has a normal mood and affect.  Nursing note and vitals reviewed.    ED Treatments / Results  Labs (all labs ordered are listed, but only abnormal results are displayed) Labs Reviewed  CBC - Abnormal; Notable for the following components:      Result Value   WBC 12.6 (*)    All other components within normal limits  URINALYSIS, ROUTINE W REFLEX MICROSCOPIC - Abnormal; Notable for the following components:   Color, Urine STRAW (*)    Glucose, UA >=500 (*)    Ketones, ur 5 (*)    All other components within normal limits  CBG MONITORING, ED - Abnormal; Notable for the following components:   Glucose-Capillary >600 (*)    All other components within normal limits  COMPREHENSIVE METABOLIC PANEL  LIPASE, BLOOD  I-STAT VENOUS BLOOD GAS, ED    EKG  EKG Interpretation  Date/Time:  Tuesday July 29 2017 15:52:26 EST Ventricular Rate:  75 PR Interval:  142 QRS Duration: 86 QT Interval:  518 QTC Calculation: 578 R Axis:   66 Text Interpretation:  Sinus rhythm ST-t wave abnormality Artifact Abnormal ekg Confirmed by Carmin Muskrat [phone]) on 07/29/2017 5:47:24 PM       Radiology No results  found.  Procedures Procedures (including critical care time)  Medications Ordered in ED Medications  sodium chloride 0.9 % bolus 1,000 mL (not administered)     Initial Impression / Assessment and Plan / ED Course  I have reviewed the triage vital signs and the nursing notes.  Pertinent labs & imaging results that were available during my care of the patient were reviewed by me and considered in my medical decision making (see chart for details).   Initial glucose greater than 600, fluids started empirically    On repeat exam the patient is in similar condition. Family aware of initial findings, concern for hyperglycemia. Patient will start insulin drip given the hyperglycemia, weakness, concern for dehydration.  Patient is already receiving IV   Update: Remaining labs notable for hyperglycemia, with hyponatremia, though this corrects, there is still anion gap as well. Patient has received IV fluids, insulin drip, will be admitted for diabetic ketoacidosis with substantial dehydration, ongoing monitoring, management of his insulin drip.  Final Clinical Impressions(s) / ED Diagnoses  Diabetic ketoacidosis   CRITICAL CARE Performed by: Carmin Muskrat Total critical care time: 40 minutes Critical care time was exclusive of separately billable procedures and treating other patients. Critical care was necessary to treat or prevent imminent or life-threatening deterioration. Critical care was time spent personally by me on the following activities: development of treatment plan with patient and/or surrogate as well as nursing, discussions with consultants, evaluation of patient's response to treatment, examination of patient, obtaining history from patient or surrogate, ordering and performing treatments and interventions, ordering and review of laboratory studies, ordering and review of radiographic studies, pulse oximetry and re-evaluation of patient's condition.    Carmin Muskrat, MD 07/29/17 2214

## 2017-07-29 NOTE — ED Triage Notes (Signed)
Onset several days weakness and headache.  Vomited 2 nights ago and today.  Pt has thrush that was dx today.

## 2017-07-29 NOTE — H&P (Signed)
PCP:   Susy Frizzle, MD   Chief Complaint:  weakness  HPI: this is a 83 y/o gentleman who ~ 1 week ago became swimmy headed and had to be helped/ he has not felt good since. Over the weekend he continued to feel worse. He saw his PCP. He was not getting better he was feeling weak, not eating, can hardly walk. He had nausea and vomiting Saturday and Sunday night. No diarrhea. He has mild stomach discomfort. He denies BOU. He denies fevers or chills.he does not know his baseline FSBS. He is compliant with home meds but has not taken meds since Sunday because of difficulty swalowing. He was brought to the ER  The patient has a h/o esophageal strictures. He's been dilated twice. He now reports choking on food and drinks.  History provided by son and patient who is present at bedside.  Review of Systems:  The patient denies anorexia, fever, weight loss,, vision loss, decreased hearing, hoarseness, chest pain, syncope, dyspnea on exertion, peripheral edema, balance deficits, nausea, vomiting, hemoptysis, abdominal pain, melena, hematochezia, severe indigestion/heartburn, adominal discomfort, hematuria, incontinence, genital sores, muscle weakness, suspicious skin lesions, transient blindness, difficulty walking, depression, unusual weight change, abnormal bleeding, enlarged lymph nodes, angioedema, and breast masses.  Past Medical History: Past Medical History:  Diagnosis Date  . Acute venous embolism and thrombosis of unspecified deep vessels of lower extremity   . Atrial flutter (HCC)    and SVT that is post ablation  . CAD (coronary artery disease)   . Chronic kidney disease    stones  . Dyslipidemia   . GERD (gastroesophageal reflux disease)   . HTN (hypertension)   . Shortness of breath    on exertion   Past Surgical History:  Procedure Laterality Date  . CARDIAC CATHETERIZATION     s/p cardiac catheterization and bypass surgery as well as EGD with dilation x3, left lower  extremity vein stripping  . CORONARY ARTERY BYPASS GRAFT    . ESOPHAGOGASTRODUODENOSCOPY N/A 12/06/2013   Procedure: ESOPHAGOGASTRODUODENOSCOPY (EGD);  Surgeon: Lear Ng, MD;  Location: [facility];  Service: Endoscopy;  Laterality: N/A;  . left knee anthroscopy    . SECONDARY CLOSURE ARM      Medications: Prior to Admission medications   Medication Sig Start Date End Date Taking? Authorizing Provider  aspirin 325 MG tablet Take 325 mg by mouth daily.    Yes [provider]  metFORMIN (GLUCOPHAGE) 500 MG tablet Take 1 tablet (500 mg total) by mouth 2 (two) times daily with a meal. 08/09/16  Yes Susy Frizzle, MD  metoprolol succinate (TOPROL-XL) 25 MG 24 hr tablet TAKE 1 TABLET BY MOUTH ONCE DAILY 04/17/17  Yes Lelon Perla, MD  nitroGLYCERIN (NITROSTAT) 0.4 MG SL tablet Place 0.4 mg under the tongue every 5 (five) minutes as needed for chest pain. 11/14/11  Yes Lelon Perla, MD  nystatin (MYCOSTATIN) 100000 UNIT/ML suspension Take 5 mLs (500,000 Units total) by mouth 4 (four) times daily. 07/28/17  Yes Dena Billet B, PA-C  pantoprazole (PROTONIX) 40 MG tablet TAKE 1 TABLET BY MOUTH EVERY DAY 11/19/16  Yes Susy Frizzle, MD  pravastatin (PRAVACHOL) 40 MG tablet TAKE 1 TABLET BY MOUTH EVERY DAY 05/23/17  Yes Lelon Perla, MD    Allergies:   Allergies  Allergen Reactions  . Celebrex [Celecoxib] Rash    blisters    Social History:  reports that he has been smoking cigarettes.  He has been smoking about  1.00 pack per day. he has never used smokeless tobacco. He reports that he does not drink alcohol or use drugs.  Family History: History reviewed. No pertinent family history.  Physical Exam: Vitals:   07/29/17 2045 07/29/17 2115 07/29/17 2130 07/29/17 2145  BP: 139/70 132/70 130/68 112/64  Pulse: 64  68 68  Resp: (!) 23 (!) 24 15 14   Temp:      TempSrc:      SpO2: 100%  100% 100%    General:  Alert and oriented times three, well  developed, elderly, no acute distress Eyes: PERRLA, pink conjunctiva, no scleral icterus ENT: dry oral mucosa, neck supple, no thyromegaly Lungs: clear to ascultation, no wheeze, no crackles, no use of accessory muscles Cardiovascular: regular rate and rhythm, no regurgitation, no gallops, no murmurs. No carotid bruits, no JVD Abdomen: soft, positive BS, non-tender, non-distended, no organomegaly, not an acute abdomen GU: not examined Neuro: CN II - XII grossly intact, sensation intact Musculoskeletal: strength 5/5 all extremities, no clubbing, cyanosis or edema Skin: no rash, no subcutaneous crepitation, no decubitus Psych: appropriate patient   Labs on Admission:  Recent Labs    07/28/17 1257 07/29/17 2014  NA 124* 124*  K 5.5* 5.0  CL 87* 90*  CO2 26 23  GLUCOSE 720* 602*  BUN 19 24*  CREATININE 1.38* 1.44*  CALCIUM 10.1 8.6*   Recent Labs    07/28/17 1257 07/29/17 2014  AST 13 17  ALT 11 13*  ALKPHOS  --  81  BILITOT 0.8 1.0  PROT 7.7 7.1  ALBUMIN  --  3.1*   Recent Labs    07/29/17 2014  LIPASE 20   Recent Labs    07/28/17 1257 07/29/17 1543  WBC 11.3* 12.6*  NEUTROABS 8,373*  --   HGB 14.7 14.5  HCT 45.5 42.1  MCV 89.7 87.0  PLT 169 198   No results for input(s): CKTOTAL, CKMB, CKMBINDEX, TROPONINI in the last 72 hours. Invalid input(s): POCBNP No results for input(s): DDIMER in the last 72 hours. Recent Labs    07/28/17 1257  HGBA1C >14.0*   No results for input(s): CHOL, HDL, LDLCALC, TRIG, CHOLHDL, LDLDIRECT in the last 72 hours. No results for input(s): TSH, T4TOTAL, T3FREE, THYROIDAB in the last 72 hours.  Invalid input(s): FREET3 No results for input(s): VITAMINB12, FOLATE, FERRITIN, TIBC, IRON, RETICCTPCT in the last 72 hours.  Micro Results: Recent Results (from the past 240 hour(s))  STREP GROUP A AG, W/REFLEX TO CULT     Status: None   Collection Time: 07/28/17 12:50 PM  Result Value Ref Range Status   Streptococcus, Group A  Screen (Direct) NONE DETECTED  Final     Radiological Exams on Admission: No results found.  Assessment/Plan Present on Admission: . DKA (diabetic ketoacidoses) ([place]) -admit to step down -mild, early. DKA. DKA orderset initiated May have precipitated by a viral syndrome. Will check influenza PCR  H/o Esophageal stricture/dysphagia -need esophagewal dilation inpt vs outpatient  . Atrial flutter ([place]) -will order a EKG  . Coronary atherosclerosis -stable, home meds resumed  Tobacco use -nicotine patch, duonebs PRN  . Essential hypertension -stable, home meds reesumed   Lon Klippel 07/29/2017, 11:16 PM

## 2017-07-30 DIAGNOSIS — E1110 Type 2 diabetes mellitus with ketoacidosis without coma: Principal | ICD-10-CM

## 2017-07-30 LAB — CBG MONITORING, ED
GLUCOSE-CAPILLARY: 143 mg/dL — AB (ref 65–99)
GLUCOSE-CAPILLARY: 155 mg/dL — AB (ref 65–99)
GLUCOSE-CAPILLARY: 161 mg/dL — AB (ref 65–99)
GLUCOSE-CAPILLARY: 136 mg/dL — AB (ref 65–99)
GLUCOSE-CAPILLARY: 90 mg/dL (ref 65–99)
Glucose-Capillary: 126 mg/dL — ABNORMAL HIGH (ref 65–99)
Glucose-Capillary: 128 mg/dL — ABNORMAL HIGH (ref 65–99)
Glucose-Capillary: 144 mg/dL — ABNORMAL HIGH (ref 65–99)
Glucose-Capillary: 156 mg/dL — ABNORMAL HIGH (ref 65–99)
Glucose-Capillary: 286 mg/dL — ABNORMAL HIGH (ref 65–99)
Glucose-Capillary: 85 mg/dL (ref 65–99)

## 2017-07-30 LAB — INFLUENZA PANEL BY PCR (TYPE A & B)
INFLAPCR: NEGATIVE
INFLBPCR: NEGATIVE

## 2017-07-30 LAB — BASIC METABOLIC PANEL
ANION GAP: 13 (ref 5–15)
ANION GAP: 9 (ref 5–15)
BUN: 24 mg/dL — ABNORMAL HIGH (ref 6–20)
BUN: 20 mg/dL (ref 6–20)
CO2: 24 mmol/L (ref 22–32)
CO2: 23 mmol/L (ref 22–32)
Calcium: 9.5 mg/dL (ref 8.9–10.3)
Calcium: 7.9 mg/dL — ABNORMAL LOW (ref 8.9–10.3)
Chloride: 96 mmol/L — ABNORMAL LOW (ref 101–111)
Chloride: 101 mmol/L (ref 101–111)
Creatinine, Ser: 1.42 mg/dL — ABNORMAL HIGH (ref 0.61–1.24)
Creatinine, Ser: 1.06 mg/dL (ref 0.61–1.24)
GFR calc Af Amer: 52 mL/min — ABNORMAL LOW (ref >60)
GFR calc non Af Amer: 45 mL/min — ABNORMAL LOW (ref >60)
GLUCOSE: 141 mg/dL — AB (ref 65–99)
GLUCOSE: 125 mg/dL — AB (ref 65–99)
POTASSIUM: 3.4 mmol/L — AB (ref 3.5–5.1)
POTASSIUM: 4 mmol/L (ref 3.5–5.1)
Sodium: 133 mmol/L — ABNORMAL LOW (ref 135–145)
Sodium: 133 mmol/L — ABNORMAL LOW (ref 135–145)

## 2017-07-30 LAB — CULTURE, GROUP A STREP
MICRO NUMBER:: 90146848
SPECIMEN QUALITY:: ADEQUATE

## 2017-07-30 LAB — STREP GROUP A AG, W/REFLEX TO CULT: Streptococcus, Group A Screen (Direct): NOT DETECTED

## 2017-07-30 LAB — MAGNESIUM: Magnesium: 1.7 mg/dL (ref 1.7–2.4)

## 2017-07-30 LAB — CBC
HEMATOCRIT: 40.4 % (ref 39.0–52.0)
HEMOGLOBIN: 14.3 g/dL (ref 13.0–17.0)
MCH: 30.2 pg (ref 26.0–34.0)
MCHC: 35.4 g/dL (ref 30.0–36.0)
MCV: 85.2 fL (ref 78.0–100.0)
Platelets: 215 10*3/uL (ref 150–400)
RBC: 4.74 MIL/uL (ref 4.22–5.81)
RDW: 14.3 % (ref 11.5–15.5)
WBC: 12.5 10*3/uL — ABNORMAL HIGH (ref 4.0–10.5)

## 2017-07-30 MED ORDER — ENOXAPARIN SODIUM 40 MG/0.4ML ~~LOC~~ SOLN
40.0000 mg | SUBCUTANEOUS | Status: DC
  Administered 2017-07-30 – 2017-08-01 (×3): 40 mg via SUBCUTANEOUS
  Filled 2017-07-30 (×3): qty 0.4

## 2017-07-30 MED ORDER — SODIUM CHLORIDE 0.9 % IV SOLN
INTRAVENOUS | Status: DC
  Administered 2017-07-30: 1.7 [IU]/h via INTRAVENOUS

## 2017-07-30 MED ORDER — SODIUM CHLORIDE 0.9 % IV BOLUS (SEPSIS)
1000.0000 mL | Freq: Once | INTRAVENOUS | Status: AC
  Administered 2017-07-30: 1000 mL via INTRAVENOUS

## 2017-07-30 MED ORDER — POTASSIUM CHLORIDE 10 MEQ/100ML IV SOLN
10.0000 meq | INTRAVENOUS | Status: AC
  Administered 2017-07-30: 10 meq via INTRAVENOUS
  Filled 2017-07-30 (×2): qty 100

## 2017-07-30 MED ORDER — NICOTINE 14 MG/24HR TD PT24
14.0000 mg | MEDICATED_PATCH | Freq: Every day | TRANSDERMAL | Status: DC
  Administered 2017-07-31 – 2017-08-01 (×2): 14 mg via TRANSDERMAL
  Filled 2017-07-30 (×2): qty 1

## 2017-07-30 MED ORDER — SODIUM CHLORIDE 0.9 % IV SOLN: INTRAVENOUS | Status: AC

## 2017-07-30 MED ORDER — IPRATROPIUM-ALBUTEROL 0.5-2.5 (3) MG/3ML IN SOLN: 3.0000 mL | RESPIRATORY_TRACT | Status: DC | PRN

## 2017-07-30 MED ORDER — INSULIN ASPART 100 UNIT/ML ~~LOC~~ SOLN
0.0000 [IU] | Freq: Every day | SUBCUTANEOUS | Status: DC
  Administered 2017-07-31: 2 [IU] via SUBCUTANEOUS

## 2017-07-30 MED ORDER — SODIUM CHLORIDE 0.9 % IV SOLN
INTRAVENOUS | Status: DC
  Administered 2017-07-30 – 2017-07-31 (×3): via INTRAVENOUS

## 2017-07-30 MED ORDER — INSULIN ASPART 100 UNIT/ML ~~LOC~~ SOLN
0.0000 [IU] | Freq: Three times a day (TID) | SUBCUTANEOUS | Status: DC
  Administered 2017-07-30: 1 [IU] via SUBCUTANEOUS
  Administered 2017-07-31 (×2): 2 [IU] via SUBCUTANEOUS
  Administered 2017-08-01: 3 [IU] via SUBCUTANEOUS
  Administered 2017-08-01: 2 [IU] via SUBCUTANEOUS
  Filled 2017-07-30: qty 1

## 2017-07-30 MED ORDER — DEXTROSE-NACL 5-0.45 % IV SOLN
INTRAVENOUS | Status: DC
  Administered 2017-07-30: 01:00:00 via INTRAVENOUS

## 2017-07-30 MED ORDER — INSULIN GLARGINE 100 UNIT/ML ~~LOC~~ SOLN
5.0000 [IU] | Freq: Every day | SUBCUTANEOUS | Status: DC
  Administered 2017-07-30: 5 [IU] via SUBCUTANEOUS
  Filled 2017-07-30 (×2): qty 0.05

## 2017-07-30 NOTE — Progress Notes (Signed)
PROGRESS NOTE  [name] BUL:[id number] DOB: [birth year] DOA: 07/29/2017 PCP: Susy Frizzle, MD  HPI/Recap of past 24 hours: 83 yr old male with a history of CAD, HTN, HLD, GERD, Type 2 DM, esophageal stricture presents to the ED with a couple of days of N/V, mild abdominal pain and generalized weakness. Pt unable to tolerate orally due to nausea and therefore wasn't able to take his meds. Denied any fever/chills, diarrhea, chest pain. In the ED, pt was noted to be in DKA with BS >600. Pt was placed on glucostabilizer. Pt also reported difficulty with swallowing solids and sometimes liquid. Pt with hx of esophageal strictures with multiple dilations. Pt admitted for further management.  Today, pt denies any new symptoms. Denies any abdominal pain, n/v, fever/chills, chest pain. Still reports generalized weakness. BS noted to have normalized, with closed anion gap.    Assessment/Plan: Active Problems:   Essential hypertension   Coronary atherosclerosis   Atrial flutter (HCC)   Dysphagia   DKA (diabetic ketoacidoses) (HCC)  DKA A1c >14.0 BS currently normalized with closed anion gap s/p glucostabilizer Continue SSI and lantus, IVF Hold home metformin  Hx of esophageal stricture/dysphagia Hx of multiple dilation Consult GI and SLP Pantoprazole  CAD Chest pain free Home toprol, asa, statin  HTN On toprol  Tobacco abuse Advised cessation Nicotine patch Duoneb   Code Status: Full   Family Communication: None at bedside  Disposition Plan: Home once stable   Consultants:  None  Procedures:  None   Antimicrobials:  None   DVT prophylaxis: Lovenox   Objective: Vitals:   07/30/17 1830 07/30/17 1845 07/30/17 1915 07/30/17 1930  BP: 106/69 118/63 116/63 113/76  Pulse: 68 61 73 79  Resp: 16 13 11 20   Temp:      TempSrc:      SpO2: 100% 100% 100% 100%    Intake/Output Summary (Last 24 hours) at 07/30/2017 2114 Last data filed at 07/30/2017  0437 Gross per 24 hour  Intake 1000 ml  Output -  Net 1000 ml   There were no vitals filed for this visit.  Exam:   General:  Alert, awake, oriented  Cardiovascular: S1, S2 present, no added hrt sound   Respiratory: Chest clear bilaterally  Abdomen: Soft, non-tender, non-distended, BS present   Musculoskeletal: No pedal edema bilaterally   Skin: Normal  Psychiatry: Normal mood   Data Reviewed: CBC: Recent Labs  Lab 07/28/17 1257 07/29/17 1543 07/30/17 0123  WBC 11.3* 12.6* 12.5*  NEUTROABS 8,373*  --   --   HGB 14.7 14.5 14.3  HCT 45.5 42.1 40.4  MCV 89.7 87.0 85.2  PLT 169 198 321   Basic Metabolic Panel: Recent Labs  Lab 07/28/17 1257 07/29/17 2014 07/30/17 0123 07/30/17 0431  NA 124* 124* 133* 133*  K 5.5* 5.0 3.4* 4.0  CL 87* 90* 96* 101  CO2 26 23 24 23   GLUCOSE 720* 602* 141* 125*  BUN 19 24* 24* 20  CREATININE 1.38* 1.44* 1.42* 1.06  CALCIUM 10.1 8.6* 9.5 7.9*  MG  --   --   --  1.7   GFR: Estimated Creatinine Clearance: 50.6 mL/min (by C-G formula based on SCr of 1.06 mg/dL). Liver Function Tests: Recent Labs  Lab 07/28/17 1257 07/29/17 2014  AST 13 17  ALT 11 13*  ALKPHOS  --  81  BILITOT 0.8 1.0  PROT 7.7 7.1  ALBUMIN  --  3.1*   Recent Labs  Lab 07/29/17 2014  LIPASE 20   No results for input(s): AMMONIA in the last 168 hours. Coagulation Profile: No results for input(s): INR, PROTIME in the last 168 hours. Cardiac Enzymes: No results for input(s): CKTOTAL, CKMB, CKMBINDEX, TROPONINI in the last 168 hours. BNP (last 3 results) No results for input(s): PROBNP in the last 8760 hours. HbA1C: Recent Labs    07/28/17 1257  HGBA1C >14.0*   CBG: Recent Labs  Lab 07/30/17 0649 07/30/17 0807 07/30/17 0916 07/30/17 1156 07/30/17 1716  GLUCAP 161* 144* 136* 90 85   Lipid Profile: No results for input(s): CHOL, HDL, LDLCALC, TRIG, CHOLHDL, LDLDIRECT in the last 72 hours. Thyroid Function Tests: No results for  input(s): TSH, T4TOTAL, FREET4, T3FREE, THYROIDAB in the last 72 hours. Anemia Panel: No results for input(s): VITAMINB12, FOLATE, FERRITIN, TIBC, IRON, RETICCTPCT in the last 72 hours. Urine analysis:    Component Value Date/Time   COLORURINE STRAW (A) 07/29/2017 1543   APPEARANCEUR CLEAR 07/29/2017 1543   LABSPEC 1.026 07/29/2017 1543   PHURINE 5.0 07/29/2017 1543   GLUCOSEU >=500 (A) 07/29/2017 1543   HGBUR NEGATIVE 07/29/2017 1543   BILIRUBINUR NEGATIVE 07/29/2017 1543   KETONESUR 5 (A) 07/29/2017 1543   PROTEINUR NEGATIVE 07/29/2017 1543   UROBILINOGEN 0.2 09/07/2014 0917   NITRITE NEGATIVE 07/29/2017 1543   LEUKOCYTESUR NEGATIVE 07/29/2017 1543   Sepsis Labs: @LABRCNTIP (procalcitonin:4,lacticidven:4)  ) Recent Results (from the past 240 hour(s))  STREP GROUP A AG, W/REFLEX TO CULT     Status: None   Collection Time: 07/28/17 12:50 PM  Result Value Ref Range Status   Streptococcus, Group A Screen (Direct) NONE DETECTED  Final  Culture, Group A Strep     Status: None   Collection Time: 07/28/17 12:50 PM  Result Value Ref Range Status   MICRO NUMBER: [id number]  Final   SPECIMEN QUALITY: ADEQUATE  Final   SOURCE: THROAT  Final   STATUS: FINAL  Final   RESULT: No group A Streptococcus isolated  Final      Studies: No results found.  Scheduled Meds: . enoxaparin (LOVENOX) injection  40 mg Subcutaneous Q24H  . insulin aspart  0-5 Units Subcutaneous QHS  . insulin aspart  0-9 Units Subcutaneous TID WC  . insulin glargine  5 Units Subcutaneous Daily  . nicotine  14 mg Transdermal Daily    Continuous Infusions: . sodium chloride 75 mL/hr at 07/30/17 1307  . insulin (NOVOLIN-R) infusion Stopped (07/30/17 0455)     LOS: 1 day     Alma Friendly, MD [facility]   If 7PM-7AM, please contact night-coverage [email or web address] Password [facility] 07/30/2017, 9:14 PM

## 2017-07-30 NOTE — ED Notes (Signed)
Called admitting MD, Baltazar Najjar over new glucose reading of 128. New orders to come.

## 2017-07-30 NOTE — ED Notes (Signed)
Pt CBG [place] notified.

## 2017-07-30 NOTE — ED Notes (Signed)
Pt CBG 156. RN [employer]. Pt BP 87/60, pt reports no symptoms.

## 2017-07-31 LAB — CBC WITH DIFFERENTIAL/PLATELET
Basophils Absolute: 0.1 K/uL (ref 0.0–0.1)
Basophils Relative: 1 %
Eosinophils Absolute: 0.4 K/uL (ref 0.0–0.7)
Eosinophils Relative: 5 %
HCT: 36.3 % — ABNORMAL LOW (ref 39.0–52.0)
Hemoglobin: 11.8 g/dL — ABNORMAL LOW (ref 13.0–17.0)
Lymphocytes Relative: 24 %
Lymphs Abs: 2.2 K/uL (ref 0.7–4.0)
MCH: 28.5 pg (ref 26.0–34.0)
MCHC: 32.5 g/dL (ref 30.0–36.0)
MCV: 87.7 fL (ref 78.0–100.0)
Monocytes Absolute: 1.1 K/uL — ABNORMAL HIGH (ref 0.1–1.0)
Monocytes Relative: 12 %
Neutro Abs: 5.4 K/uL (ref 1.7–7.7)
Neutrophils Relative %: 58 %
Platelets: 191 K/uL (ref 150–400)
RBC: 4.14 MIL/uL — ABNORMAL LOW (ref 4.22–5.81)
RDW: 15.2 % (ref 11.5–15.5)
WBC: 9.1 K/uL (ref 4.0–10.5)

## 2017-07-31 LAB — BASIC METABOLIC PANEL
ANION GAP: 10 (ref 5–15)
BUN: 16 mg/dL (ref 6–20)
CALCIUM: 8 mg/dL — AB (ref 8.9–10.3)
CO2: 20 mmol/L — ABNORMAL LOW (ref 22–32)
Chloride: 106 mmol/L (ref 101–111)
Creatinine, Ser: 1.05 mg/dL (ref 0.61–1.24)
GLUCOSE: 97 mg/dL (ref 65–99)
Potassium: 3.7 mmol/L (ref 3.5–5.1)
SODIUM: 136 mmol/L (ref 135–145)

## 2017-07-31 LAB — GLUCOSE, CAPILLARY
GLUCOSE-CAPILLARY: 211 mg/dL — AB (ref 65–99)
Glucose-Capillary: 96 mg/dL (ref 65–99)
Glucose-Capillary: 193 mg/dL — ABNORMAL HIGH (ref 65–99)
Glucose-Capillary: 155 mg/dL — ABNORMAL HIGH (ref 65–99)

## 2017-07-31 MED ORDER — INSULIN STARTER KIT- SYRINGES (ENGLISH)
1.0000 | Freq: Once | Status: DC
  Filled 2017-07-31: qty 1

## 2017-07-31 MED ORDER — INSULIN NPH ISOPHANE & REGULAR (70-30) 100 UNIT/ML ~~LOC~~ SUSP: 5.0000 [IU] | Freq: Two times a day (BID) | SUBCUTANEOUS | 0 refills | Status: DC

## 2017-07-31 MED ORDER — INSULIN STARTER KIT- PEN NEEDLES (ENGLISH)
1.0000 | Freq: Once | Status: DC
  Filled 2017-07-31: qty 1

## 2017-07-31 NOTE — Progress Notes (Signed)
Roselind Messier, RN        # 6. S/W LILLY @ [facility] # [phone] OPT- 2   NOVOLIN  70/30 PEN  COVER- YES]  CO-PAY- $ 134.40  TIER- 3 DRUG  PRIOR APPROVAL- NO  DEDUCTIBLE: NOT MET   PREFERRED PHARMACY l [facility]   Previous Messages    ----- Message -----  From: Cecille Rubin, RN  Sent: 07/31/2017  3:36 PM  To: Sheliah Plane Ip Ccm Case Mgr Assistant      Whitman Hero RN,BSN,CM

## 2017-07-31 NOTE — Progress Notes (Addendum)
Inpatient Diabetes Program Recommendations  AACE/ADA: New Consensus Statement on Inpatient Glycemic Control (2015)  Target Ranges:  Prepandial:   less than 140 mg/dL      Peak postprandial:   less than 180 mg/dL (1-2 hours)      Critically ill patients:  140 - 180 mg/dL   Lab Results  Component Value Date   GLUCAP 96 07/31/2017   HGBA1C >14.0 (H) 07/28/2017    Review of Glycemic Control Results for TUCKER, MINTER (MRN [id number]) as of 07/31/2017 09:00  Ref. Range 07/30/2017 08:07 07/30/2017 09:16 07/30/2017 11:56 07/30/2017 17:16 07/31/2017 07:52  Glucose-Capillary Latest Ref Range: 65 - 99 mg/dL 144 (H) 136 (H) 90 85 96   Diabetes history: DM2 Outpatient Diabetes medications: Metformin 500 mg bid Current orders for Inpatient glycemic control: Lantus 5 units + Novolog correction sensitive tid + hs 0-5 units  Inpatient Diabetes Program Recommendations:   Reviewed labs and patient's h&p. Will plan to speak with patient regarding trends of CBGs @ home. May consider D/C of Metformin on discharge home due to patient age and elevations in renal labs on admission.  Met with patient and wife @ bedside to discuss diabetes care management. Patient has glucometer and strips @ home but has not been checking his CBGs. Reviewed with patient and wife the need to check CBGs. Patient drinks few drinks with regular sugar and wife shared patient does not eat many carbohydrates. May consider Novolin 70/30 bid on discharge.  Reviewed with patient and wife regarding insulin administration instructions. Wife and patient prefer to give insulin per insulin syringe instead of pen. Wife and patient return demonstration of use of insulin syringe. Shared information with Dr. Horris Latino.  Thank you, Nani Gasser. Joselynn Amoroso, RN, MSN, CDE  Diabetes Coordinator Inpatient Glycemic Control Team Team Pager [phone] (8am-5pm) 07/31/2017 9:19 AM

## 2017-07-31 NOTE — Discharge Summary (Addendum)
Discharge Summary  [name] MOQ:[id number] DOB: 06-26-1934  PCP: Susy Frizzle, MD  Admit date: 07/29/2017 Discharge date: 08/01/2017  Time spent: > 30 mins  Recommendations for Outpatient Follow-up:  1. PCP   Discharge Diagnoses:  Active Hospital Problems   Diagnosis Date Noted  . DKA (diabetic ketoacidoses) ([place]) 07/29/2017  . Dysphagia 12/06/2013  . Atrial flutter ([place]) 11/19/2008  . Coronary atherosclerosis 11/19/2008  . Essential hypertension 11/19/2008    Resolved Hospital Problems  No resolved problems to display.    Discharge Condition: Stable  Diet recommendation: Heart healthy  Vitals:   07/31/17 1204 07/31/17 1617  BP: 121/69 127/64  Pulse: 75 63  Resp: 19 19  Temp: 98 F (36.7 C) 98.1 F (36.7 C)  SpO2: 100% 99%    History of present illness:  83 yr old male with a history of CAD, HTN, HLD, GERD, Type 2 DM, esophageal stricture presents to the ED with a couple of days of N/V, mild abdominal pain and generalized weakness. Pt unable to tolerate orally due to nausea and therefore wasn't able to take his meds. Denied any fever/chills, diarrhea, chest pain. In the ED, pt was noted to be in DKA with BS >600. Pt was placed on glucostabilizer. Pt also reported difficulty with swallowing solids and sometimes liquid. Pt with hx of esophageal strictures with multiple dilations. Pt admitted for further management.  ADDENDUM: Overnight, pt reported he has no understanding of insulin and its administraton after the diabetes coordinator visited him and his wife twice and discussing with them in details. Today, pt reported feeling much better, BS are better controlled. Able to tolerate orally. Denies any chest pain, SOB, abdominal pain, N/V/D/C, fever/chills. Diabetes educator spoke to patient, wife and son about insulin requirements and administration. Pt verbalized understanding. Home health RN is set up as well to assist with further needs.  Hospital Course:    Active Problems:   Essential hypertension   Coronary atherosclerosis   Atrial flutter (HCC)   Dysphagia   DKA (diabetic ketoacidoses) (HCC)  DKA Resolved A1c >14.0 BS currently normalized with closed anion gap s/p glucostabilizer Start Novolin 70/30 5U BID, pt taught how to use insulin and provided with [facility] RN Discontinue metformin PCP to follow up closely and repeat another A1c to ensure accuracy  Hx of esophageal stricture/dysphagia Hx of multiple dilation, pt was able to tolerate orally with no difficulty SLP consulted, no further recs Continue Pantoprazole PCP to refer pt for esophageal dilation as needed  CAD Chest pain free Home toprol, asa, statin  HTN On toprol  Tobacco abuse Advised cessation   Procedures:  None   Consultations:  None  Discharge Exam: BP 127/64 (BP Location: Left Arm)   Pulse 63   Temp 98.1 F (36.7 C) (Oral)   Resp 19   Ht 6\' 2"  (1.88 m)   Wt 62.6 kg (138 lb 0.1 oz)   SpO2 99%   BMI 17.72 kg/m   General: Alert, awake, oriented Cardiovascular: S1, S2 present, no added hrt sound  Respiratory: Chest clear bilaterally  Discharge Instructions You were cared for by a hospitalist during your hospital stay. If you have any questions about your discharge medications or the care you received while you were in the hospital after you are discharged, you can call the unit and asked to speak with the hospitalist on call if the hospitalist that took care of you is not available. Once you are discharged, your primary care physician will handle any  further medical issues. Please note that NO REFILLS for any discharge medications will be authorized once you are discharged, as it is imperative that you return to your primary care physician (or establish a relationship with a primary care physician if you do not have one) for your aftercare needs so that they can reassess your need for medications and monitor your lab values.  Discharge  Instructions    Diet - low sodium heart healthy   Complete by:  As directed    Increase activity slowly   Complete by:  As directed      Allergies as of 07/31/2017      Reactions   Celebrex [celecoxib] Rash   blisters      Medication List    STOP taking these medications   metFORMIN 500 MG tablet Commonly known as:  GLUCOPHAGE     TAKE these medications   aspirin 325 MG tablet Take 325 mg by mouth daily.   insulin NPH-regular Human (70-30) 100 UNIT/ML injection Commonly known as:  NOVOLIN 70/30 Inject 5 Units into the skin 2 (two) times daily with a meal.   metoprolol succinate 25 MG 24 hr tablet Commonly known as:  TOPROL-XL TAKE 1 TABLET BY MOUTH ONCE DAILY   nitroGLYCERIN 0.4 MG SL tablet Commonly known as:  NITROSTAT Place 0.4 mg under the tongue every 5 (five) minutes as needed for chest pain.   nystatin 100000 UNIT/ML suspension Commonly known as:  MYCOSTATIN Take 5 mLs (500,000 Units total) by mouth 4 (four) times daily.   pantoprazole 40 MG tablet Commonly known as:  PROTONIX TAKE 1 TABLET BY MOUTH EVERY DAY   pravastatin 40 MG tablet Commonly known as:  PRAVACHOL TAKE 1 TABLET BY MOUTH EVERY DAY      Allergies  Allergen Reactions  . Celebrex [Celecoxib] Rash    blisters   Follow-up Information    [facility] Follow up.   Specialty:  [place] Why:  home health services arranged, office will call and set up home visits Contact information: [address] [place] [address] [phone]        Susy Frizzle, MD. Schedule an appointment as soon as possible for a visit in 1 week(s).   Specialty:  Family Medicine Contact information: [address] [place] [address] [phone]            The results of significant diagnostics from this hospitalization (including imaging, microbiology, ancillary and laboratory) are listed below for reference.    Significant Diagnostic  Studies: No results found.  Microbiology: Recent Results (from the past 240 hour(s))  STREP GROUP A AG, W/REFLEX TO CULT     Status: None   Collection Time: 07/28/17 12:50 PM  Result Value Ref Range Status   Streptococcus, Group A Screen (Direct) NONE DETECTED  Final  Culture, Group A Strep     Status: None   Collection Time: 07/28/17 12:50 PM  Result Value Ref Range Status   MICRO NUMBER: [id number]  Final   SPECIMEN QUALITY: ADEQUATE  Final   SOURCE: THROAT  Final   STATUS: FINAL  Final   RESULT: No group A Streptococcus isolated  Final     Labs: Basic Metabolic Panel: Recent Labs  Lab 07/28/17 1257 07/29/17 2014 07/30/17 0123 07/30/17 0431 07/31/17 0325  NA 124* 124* 133* 133* 136  K 5.5* 5.0 3.4* 4.0 3.7  CL 87* 90* 96* 101 106  CO2 26 23 24 23  20*  GLUCOSE 720* 602* 141* 125* 97  BUN 19 24* 24* 20 16  CREATININE 1.38* 1.44* 1.42* 1.06 1.05  CALCIUM 10.1 8.6* 9.5 7.9* 8.0*  MG  --   --   --  1.7  --    Liver Function Tests: Recent Labs  Lab 07/28/17 1257 07/29/17 2014  AST 13 17  ALT 11 13*  ALKPHOS  --  81  BILITOT 0.8 1.0  PROT 7.7 7.1  ALBUMIN  --  3.1*   Recent Labs  Lab 07/29/17 2014  LIPASE 20   No results for input(s): AMMONIA in the last 168 hours. CBC: Recent Labs  Lab 07/28/17 1257 07/29/17 1543 07/30/17 0123 07/31/17 0325  WBC 11.3* 12.6* 12.5* 9.1  NEUTROABS 8,373*  --   --  5.4  HGB 14.7 14.5 14.3 11.8*  HCT 45.5 42.1 40.4 36.3*  MCV 89.7 87.0 85.2 87.7  PLT 169 198 215 191   Cardiac Enzymes: No results for input(s): CKTOTAL, CKMB, CKMBINDEX, TROPONINI in the last 168 hours. BNP: BNP (last 3 results) No results for input(s): BNP in the last 8760 hours.  ProBNP (last 3 results) No results for input(s): PROBNP in the last 8760 hours.  CBG: Recent Labs  Lab 07/30/17 1156 07/30/17 1716 07/31/17 0752 07/31/17 1200 07/31/17 1709  GLUCAP 90 85 96 193* 155*       Signed:  Alma Friendly, MD [facility] 07/31/2017, 7:39 PM

## 2017-07-31 NOTE — Care Management Note (Signed)
Case Management Note  Patient Details  Name: [name] MRN: [id number] Date of Birth: 21-Oct-1934  Subjective/Objective:     DKA. From home with wife.               NASSIR NEIDERT (Spouse)     [phone]      PCP:  Jenna Luo  Action/Plan: Transition to home. Family to provide transportation to home.  Expected Discharge Date:    07/31/2017              Expected Discharge Plan:  [place]  In-House Referral:     Discharge planning Services  CM Consult  Post Acute Care Choice:  Home Health Choice offered to:  Patient  DME Arranged:    DME Agency:     HH Arranged:  RN [place] Agency:  [place]  Status of Service:  Completed, signed off  If discussed at [place] of Stay Meetings, dates discussed:    Additional Comments:  Sharin Mons, RN 07/31/2017, 3:48 PM

## 2017-07-31 NOTE — Evaluation (Signed)
Clinical/Bedside Swallow Evaluation Patient Details  Name: [name] MRN: [id number] Date of Birth: 07-Mar-1935  Today's Date: 07/31/2017 Time: SLP Start Time (ACUTE ONLY): 0944 SLP Stop Time (ACUTE ONLY): 0958 SLP Time Calculation (min) (ACUTE ONLY): 14 min  Past Medical History:  Past Medical History:  Diagnosis Date  . Acute venous embolism and thrombosis of unspecified deep vessels of lower extremity   . Atrial flutter (HCC)    and SVT that is post ablation  . CAD (coronary artery disease)   . Chronic kidney disease    stones  . Dyslipidemia   . GERD (gastroesophageal reflux disease)   . HTN (hypertension)   . Shortness of breath    on exertion   Past Surgical History:  Past Surgical History:  Procedure Laterality Date  . CARDIAC CATHETERIZATION     s/p cardiac catheterization and bypass surgery as well as EGD with dilation x3, left lower extremity vein stripping  . CORONARY ARTERY BYPASS GRAFT    . ESOPHAGOGASTRODUODENOSCOPY N/A 12/06/2013   Procedure: ESOPHAGOGASTRODUODENOSCOPY (EGD);  Surgeon: Lear Ng, MD;  Location: [facility];  Service: Endoscopy;  Laterality: N/A;  . left knee anthroscopy    . SECONDARY CLOSURE ARM     HPI:  83 yr old male with a history of CAD, HTN, HLD, GERD, Type 2 DM, esophageal stricture presents to the ED with a couple of days of N/V, mild abdominal pain and generalized weakness. In the ED, pt was noted to be in DKA with BS >600. Pt also reported difficulty with swallowing solids and sometimes liquid. Pt with hx of esophageal strictures with multiple dilations. He was also recently dx with thrush.   Assessment / Plan / Recommendation Clinical Impression  Pt's appears to have a functional oropharyngeal swallow and denies any difficulty since starting tx for thrush. He does however also report that he avoids meats and some particularly tough textures, but in doing so he swallows "fine". No overt signs of aspiration are observed.  Recommend to continue with unrestricted diet textures and liquids to allow pt to make his own selections. No acute SLP f/u indicated - will sign off. SLP Visit Diagnosis: Dysphagia, unspecified (R13.10)    Aspiration Risk  Mild aspiration risk    Diet Recommendation Regular;Thin liquid   Liquid Administration via: Cup;Straw Medication Administration: Whole meds with liquid Supervision: Patient able to self feed Compensations: Slow rate;Small sips/bites;Follow solids with liquid Postural Changes: Seated upright at 90 degrees;Remain upright for at least 30 minutes after po intake    Other  Recommendations Oral Care Recommendations: Oral care BID   Follow up Recommendations None      Frequency and Duration            Prognosis        Swallow Study   General HPI: 83 yr old male with a history of CAD, HTN, HLD, GERD, Type 2 DM, esophageal stricture presents to the ED with a couple of days of N/V, mild abdominal pain and generalized weakness. In the ED, pt was noted to be in DKA with BS >600. Pt also reported difficulty with swallowing solids and sometimes liquid. Pt with hx of esophageal strictures with multiple dilations. He was also recently dx with thrush. Type of Study: Bedside Swallow Evaluation Previous Swallow Assessment: none in chart by SLP, does have h/o esophageal issues Diet Prior to this Study: Regular;Thin liquids Temperature Spikes Noted: No Respiratory Status: Room air History of Recent Intubation: No Behavior/Cognition: Alert;Cooperative;Pleasant mood Oral Cavity  Assessment: Erythema(mild, of tongue) Oral Care Completed by SLP: No Oral Cavity - Dentition: Dentures, top;Dentures, bottom(bottom dentures mildly loose) Vision: Functional for self-feeding Self-Feeding Abilities: Able to feed self Patient Positioning: Upright in bed Baseline Vocal Quality: Normal Volitional Cough: Strong Volitional Swallow: Able to elicit    Oral/Motor/Sensory Function Overall  Oral Motor/Sensory Function: Within functional limits   Ice Chips Ice chips: Not tested   Thin Liquid Thin Liquid: Within functional limits Presentation: Cup;Self Fed;Straw    Nectar Thick Nectar Thick Liquid: Not tested   Honey Thick Honey Thick Liquid: Not tested   Puree Puree: Within functional limits Presentation: Self Fed;Spoon   Solid   GO   Solid: Within functional limits Presentation: Self Ennis Forts 07/31/2017,10:23 AM  Germain Osgood, M.A. CCC-SLP [phone]

## 2017-07-31 NOTE — Progress Notes (Signed)
Patient was being educated on administering insulin.However there is not an insulin syringe that is used for demonstration. So the patient is not able to practice administration on himself. Discharge paper work is printed off and put on his chart.

## 2017-07-31 NOTE — Progress Notes (Signed)
Patient attempted to be discharged by day shift RN. Patient unable to do demonstration with insulin needle d/t lack of equipment being available. Patient will need more education and return demonstration with insulin prior to being discharged.

## 2017-08-01 LAB — GLUCOSE, CAPILLARY
GLUCOSE-CAPILLARY: 170 mg/dL — AB (ref 65–99)
GLUCOSE-CAPILLARY: 239 mg/dL — AB (ref 65–99)

## 2017-08-01 NOTE — Progress Notes (Signed)
[name] to be D/C'd home per MD order. Discussed with the patient and all questions fully answered.  Allergies as of 08/01/2017      Reactions   Celebrex [celecoxib] Rash   blisters      Medication List    STOP taking these medications   metFORMIN 500 MG tablet Commonly known as:  GLUCOPHAGE     TAKE these medications   aspirin 325 MG tablet Take 325 mg by mouth daily.   insulin NPH-regular Human (70-30) 100 UNIT/ML injection Commonly known as:  NOVOLIN 70/30 Inject 5 Units into the skin 2 (two) times daily with a meal.   metoprolol succinate 25 MG 24 hr tablet Commonly known as:  TOPROL-XL TAKE 1 TABLET BY MOUTH ONCE DAILY   nitroGLYCERIN 0.4 MG SL tablet Commonly known as:  NITROSTAT Place 0.4 mg under the tongue every 5 (five) minutes as needed for chest pain.   nystatin 100000 UNIT/ML suspension Commonly known as:  MYCOSTATIN Take 5 mLs (500,000 Units total) by mouth 4 (four) times daily.   pantoprazole 40 MG tablet Commonly known as:  PROTONIX TAKE 1 TABLET BY MOUTH EVERY DAY   pravastatin 40 MG tablet Commonly known as:  PRAVACHOL TAKE 1 TABLET BY MOUTH EVERY DAY       VVS, Skin clean, dry and intact without evidence of skin break down, no evidence of skin tears noted.  IV catheter discontinued yesterday intact.  Completed return demonstration about insulin administration with patient, patient's son, and patient's wife. Wife administered lunch time insulin and checked blood sugar. Explained signs and symptoms of hyperglycemia and hypoglycemia. Insuline starter kit given to patient. An After Visit Summary was printed and given to the patient.  Patient escorted via [place], and D/C home via private auto.  [name]  08/01/2017 2:03 PM

## 2017-08-01 NOTE — Care Management Important Message (Signed)
Important Message  Patient Details  Name: [name] MRN: [id number] Date of Birth: 1934-11-19   Medicare Important Message Given:  Yes    Orbie Pyo 08/01/2017, 12:37 PM

## 2017-08-01 NOTE — Progress Notes (Signed)
Met with patient and wife @ bedside. Patient and wife state they remember discussion of insulin administration yesterday. Reviewed with patient and wife if discharged home on 70/30 insulin mix, to give ac breakfast and supper. Also reviewed hypoglycemia protocol and need to check CBGs 3-4 times daily unless ordered differently by physician Discussed with RN Markus Jarvis who is reviewing with patient, wife and their son on insulin administration. No further questions @ this time.  Relion Novolin insulin @ [facility] available for approximately $25 per vial.  Thank you, Nani Gasser. Rishawn Walck, RN, MSN, CDE  Diabetes Coordinator Inpatient Glycemic Control Team Team Pager [phone] (8am-5pm) 08/01/2017 1:13 PM

## 2017-08-07 ENCOUNTER — Encounter: Payer: Self-pay | Admitting: Family Medicine

## 2017-08-07 ENCOUNTER — Ambulatory Visit (INDEPENDENT_AMBULATORY_CARE_PROVIDER_SITE_OTHER): Payer: Medicare HMO | Admitting: Family Medicine

## 2017-08-07 VITALS — BP 118/56 | HR 68 | Temp 97.6°F | Resp 18 | Ht 74.0 in | Wt 144.0 lb

## 2017-08-07 DIAGNOSIS — R1310 Dysphagia, unspecified: Secondary | ICD-10-CM

## 2017-08-07 DIAGNOSIS — B370 Candidal stomatitis: Secondary | ICD-10-CM | POA: Diagnosis not present

## 2017-08-07 DIAGNOSIS — Z09 Encounter for follow-up examination after completed treatment for conditions other than malignant neoplasm: Secondary | ICD-10-CM | POA: Diagnosis not present

## 2017-08-07 DIAGNOSIS — R634 Abnormal weight loss: Secondary | ICD-10-CM

## 2017-08-07 DIAGNOSIS — R69 Illness, unspecified: Secondary | ICD-10-CM | POA: Diagnosis not present

## 2017-08-07 MED ORDER — ACCU-CHEK AVIVA PLUS W/DEVICE KIT: PACK | 1 refills | Status: DC

## 2017-08-07 MED ORDER — CLOTRIMAZOLE 10 MG MT TROC: 10.0000 mg | Freq: Every day | OROMUCOSAL | 1 refills | Status: DC

## 2017-08-07 MED ORDER — LANCETS MISC. MISC: 5 refills | Status: DC

## 2017-08-07 MED ORDER — GLUCOSE BLOOD VI STRP: ORAL_STRIP | 12 refills | Status: DC

## 2017-08-07 MED ORDER — NYSTATIN 100000 UNIT/ML MT SUSP: 5.0000 mL | Freq: Four times a day (QID) | OROMUCOSAL | 0 refills | Status: DC

## 2017-08-07 NOTE — Addendum Note (Signed)
Addended by: Shary Decamp B on: 08/07/2017 04:30 PM   Modules accepted: Orders

## 2017-08-07 NOTE — Progress Notes (Signed)
Subjective:    Patient ID: [name], male    DOB: Mar 26, 1935, 83 y.o.   MRN: [id number]  HPI  Unfortunately, the patient was recently admitted to the hospital with hyperglycemia and severe dehydration and possible diabetic ketoacidosis.  I have copied relevant portions of his discharge summary below and included them for my reference:  Admit date: 07/29/2017 Discharge date: 08/01/2017  Time spent: > 30 mins  Recommendations for Outpatient Follow-up:  1. PCP   Discharge Diagnoses:      Active Hospital Problems   Diagnosis Date Noted  . DKA (diabetic ketoacidoses) ([place]) 07/29/2017  . Dysphagia 12/06/2013  . Atrial flutter ([place]) 11/19/2008  . Coronary atherosclerosis 11/19/2008  . Essential hypertension 11/19/2008    Resolved Hospital Problems  No resolved problems to display.    Discharge Condition: Stable  Diet recommendation: Heart healthy      Vitals:   07/31/17 1204 07/31/17 1617  BP: 121/69 127/64  Pulse: 75 63  Resp: 19 19  Temp: 98 F (36.7 C) 98.1 F (36.7 C)  SpO2: 100% 99%    History of present illness:  83 yr old male with a history of CAD, HTN, HLD, GERD, Type 2 DM, esophageal stricture presents to the ED with a couple of days of N/V, mild abdominal pain and generalized weakness. Pt unable to tolerate orally due to nausea and therefore wasn't able to take his meds. Denied any fever/chills, diarrhea, chest pain. In the ED, pt was noted to be in DKA with BS >600. Pt was placed on glucostabilizer. Pt also reported difficulty with swallowing solids and sometimes liquid. Pt with hx of esophageal strictures with multiple dilations. Pt admitted for further management.  ADDENDUM: Overnight, pt reported he has no understanding of insulin and its administraton after the diabetes coordinator visited him and his wife twice and discussing with them in details. Today, pt reported feeling much better, BS are better controlled. Able to tolerate orally.  Denies any chest pain, SOB, abdominal pain, N/V/D/C, fever/chills. Diabetes educator spoke to patient, wife and son about insulin requirements and administration. Pt verbalized understanding. Home health RN is set up as well to assist with further needs.  Hospital Course:  Active Problems:   Essential hypertension   Coronary atherosclerosis   Atrial flutter (HCC)   Dysphagia   DKA (diabetic ketoacidoses) (HCC)  DKA Resolved A1c >14.0 BS currently normalized with closed anion gap s/p glucostabilizer Start Novolin 70/30 5U BID, pt taught how to use insulin and provided with [facility] RN Discontinue metformin PCP to follow up closely and repeat another A1c to ensure accuracy  Hx of esophageal stricture/dysphagia Hx of multiple dilation, pt was able to tolerate orally with no difficulty SLP consulted, no further recs Continue Pantoprazole PCP to refer pt for esophageal dilation as needed  CAD Chest pain free Home toprol, asa, statin  HTN On toprol  Tobacco abuse Advised cessation   Procedures:  None   Consultations:  None  Discharge Exam: BP 127/64 (BP Location: Left Arm)   Pulse 63   Temp 98.1 F (36.7 C) (Oral)   Resp 19   Ht 6\' 2"  (1.88 m)   Wt 62.6 kg (138 lb 0.1 oz)   SpO2 99%   BMI 17.72 kg/m   General: Alert, awake, oriented Cardiovascular: S1, S2 present, no added hrt sound  Respiratory: Chest clear bilaterally  Discharge Instructions You were cared for by a hospitalist during your hospital stay. If you have any questions about your discharge  medications or the care you received while you were in the hospital after you are discharged, you can call the unit and asked to speak with the hospitalist on call if the hospitalist that took care of you is not available. Once you are discharged, your primary care physician will handle any further medical issues. Please note that NO REFILLS for any discharge medications will be authorized once you are  discharged, as it is imperative that you return to your primary care physician (or establish a relationship with a primary care physician if you do not have one) for your aftercare needs so that they can reassess your need for medications and monitor your lab values.      Discharge Instructions    Diet - low sodium heart healthy   Complete by:  As directed    Increase activity slowly   Complete by:  As directed          Allergies as of 07/31/2017      Reactions   Celebrex [celecoxib] Rash   blisters              Medication List     STOP taking these medications   metFORMIN 500 MG tablet Commonly known as:  GLUCOPHAGE     TAKE these medications   aspirin 325 MG tablet Take 325 mg by mouth daily.   insulin NPH-regular Human (70-30) 100 UNIT/ML injection Commonly known as:  NOVOLIN 70/30 Inject 5 Units into the skin 2 (two) times daily with a meal.   metoprolol succinate 25 MG 24 hr tablet Commonly known as:  TOPROL-XL TAKE 1 TABLET BY MOUTH ONCE DAILY   nitroGLYCERIN 0.4 MG SL tablet Commonly known as:  NITROSTAT Place 0.4 mg under the tongue every 5 (five) minutes as needed for chest pain.   nystatin 100000 UNIT/ML suspension Commonly known as:  MYCOSTATIN Take 5 mLs (500,000 Units total) by mouth 4 (four) times daily.   pantoprazole 40 MG tablet Commonly known as:  PROTONIX TAKE 1 TABLET BY MOUTH EVERY DAY   pravastatin 40 MG tablet Commonly known as:  PRAVACHOL TAKE 1 TABLET BY MOUTH EVERY DAY     08/07/17 Patient is here today for follow-up. Wt Readings from Last 3 Encounters:  08/07/17 144 lb (65.3 kg)  07/31/17 138 lb 0.1 oz (62.6 kg)  07/28/17 142 lb (64.4 kg)   I last saw the patient in October 2018 he weighed 163 pounds.  He has lost almost 20 pounds since I last saw him.  He blames it on inability to swallow.  He has a history of dysphasia and esophageal strictures requiring dilatation.  He states that over the last 2-3 months,  he has had increasing difficulty eating and has stopped eating because of this causing his weight loss.  Roughly around the same time, his sugar spiraled out of control prompting his recent hospitalization.  He is currently only taking 5 units twice a day of his long-acting insulin 70/30.  He has very few blood sugar readings for me to review today.  His fasting blood sugars in the morning are typically between 180 and 200.  His evening blood sugars are between 200-250.  There have been no hypoglycemic episodes.  He is no longer taking metformin.  He continues to report burning pain in his mouth and on his tongue.  He was diagnosed with thrush.  Despite using the mouthwash for 7 days, his symptoms improved approximately 60% but are still present.  On exam today  there is no thick white plaque on his tongue however the tongue is swollen and shows evidence of median rhomboid glossitis.  There is erythema in his oral mucosa but no obvious thrush. Past Medical History:  Diagnosis Date  . Acute venous embolism and thrombosis of unspecified deep vessels of lower extremity   . Atrial flutter (HCC)    and SVT that is post ablation  . CAD (coronary artery disease)   . Chronic kidney disease    stones  . Dyslipidemia   . GERD (gastroesophageal reflux disease)   . HTN (hypertension)   . Shortness of breath    on exertion   Past Surgical History:  Procedure Laterality Date  . CARDIAC CATHETERIZATION     s/p cardiac catheterization and bypass surgery as well as EGD with dilation x3, left lower extremity vein stripping  . CORONARY ARTERY BYPASS GRAFT    . ESOPHAGOGASTRODUODENOSCOPY N/A 12/06/2013   Procedure: ESOPHAGOGASTRODUODENOSCOPY (EGD);  Surgeon: Lear Ng, MD;  Location: [facility];  Service: Endoscopy;  Laterality: N/A;  . left knee anthroscopy    . SECONDARY CLOSURE ARM     Current Outpatient Medications on File Prior to Visit  Medication Sig Dispense Refill  . aspirin 325 MG tablet  Take 325 mg by mouth daily.     . insulin NPH-regular Human (NOVOLIN 70/30) (70-30) 100 UNIT/ML injection Inject 5 Units into the skin 2 (two) times daily with a meal. 10 mL 0  . metoprolol succinate (TOPROL-XL) 25 MG 24 hr tablet TAKE 1 TABLET BY MOUTH ONCE DAILY 30 tablet 6  . nitroGLYCERIN (NITROSTAT) 0.4 MG SL tablet Place 0.4 mg under the tongue every 5 (five) minutes as needed for chest pain.    . pantoprazole (PROTONIX) 40 MG tablet TAKE 1 TABLET BY MOUTH EVERY DAY 30 tablet 11  . pravastatin (PRAVACHOL) 40 MG tablet TAKE 1 TABLET BY MOUTH EVERY DAY 90 tablet 2   No current facility-administered medications on file prior to visit.    Allergies  Allergen Reactions  . Celebrex [Celecoxib] Rash    blisters   Social History   Socioeconomic History  . Marital status: Married    Spouse name: Not on file  . Number of children: Not on file  . Years of education: Not on file  . Highest education level: Not on file  Social Needs  . Financial resource strain: Not on file  . Food insecurity - worry: Not on file  . Food insecurity - inability: Not on file  . Transportation needs - medical: Not on file  . Transportation needs - non-medical: Not on file  Occupational History  . Not on file  Tobacco Use  . Smoking status: Current Every Day Smoker    Packs/day: 1.00    Types: Cigarettes  . Smokeless tobacco: Never Used  . Tobacco comment: greater than 50 pack year history  Substance and Sexual Activity  . Alcohol use: No  . Drug use: No  . Sexual activity: Not on file  Other Topics Concern  . 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      Review of Systems  All other systems reviewed and are negative.      Objective:   Physical Exam  Constitutional: He appears well-developed. He has a sickly appearance.  HENT:  Mouth/Throat: Posterior  oropharyngeal erythema present.  Neck: Neck supple. No JVD present.  Cardiovascular: Normal rate, regular rhythm, normal heart sounds and intact distal pulses.  No murmur heard. Pulmonary/Chest: Effort normal and breath sounds normal. No respiratory distress. He has no wheezes. He has no rales.  Abdominal: Soft. Bowel sounds are normal. He exhibits no distension. There is no tenderness. There is no rebound and no guarding.          Assessment & Plan:  Hospital discharge follow-up - Plan: nystatin (MYCOSTATIN) 100000 UNIT/ML suspension, CBC with Differential/Platelet, COMPLETE METABOLIC PANEL WITH GFR  Oral candidiasis - Plan: nystatin (MYCOSTATIN) 100000 UNIT/ML suspension  Dysphagia, unspecified type  Weight loss  The patient's hemoglobin A1c's have been relatively well controlled less than 7 for years.  It seems as though the last 3 months, the patient has spiraled out of control with 20-30 pounds of weight loss, progressive dysphasia, hemoglobin A1c greater than 14.  However despite a hemoglobin A1c greater than 14, his sugars are relatively well controlled with very minimal insulin, 10 units/day.  My concern is that whatever is causing the dysphasia in his throat may be causing the sudden worsening of his blood sugars.  Given his age and weight loss, I am concerned about possible malignancy in the esophagus.  I have recommended a GI consult as soon as possible for an EGD given his weight loss and dysphasia.  Meanwhile increase his insulin to 8 units twice daily and recheck next week to make further titrations based on his fasting and 2-hour postprandial blood sugars.  We will continue the nystatin since he is 65% better for an additional week for what appears to be median rhomboid glossitis.  Follow-up next week

## 2017-08-08 LAB — CBC WITH DIFFERENTIAL/PLATELET
Basophils Absolute: 62 cells/uL (ref 0–200)
Basophils Relative: 0.6 %
EOS ABS: 312 {cells}/uL (ref 15–500)
Eosinophils Relative: 3 %
HCT: 39.7 % (ref 38.5–50.0)
Hemoglobin: 13.2 g/dL (ref 13.2–17.1)
Lymphs Abs: 2142 cells/uL (ref 850–3900)
MCH: 29 pg (ref 27.0–33.0)
MCHC: 33.2 g/dL (ref 32.0–36.0)
MCV: 87.3 fL (ref 80.0–100.0)
MPV: 10.2 fL (ref 7.5–12.5)
Monocytes Relative: 10.8 %
NEUTROS PCT: 65 %
Neutro Abs: 6760 cells/uL (ref 1500–7800)
PLATELETS: 330 10*3/uL (ref 140–400)
RBC: 4.55 10*6/uL (ref 4.20–5.80)
RDW: 14.3 % (ref 11.0–15.0)
TOTAL LYMPHOCYTE: 20.6 %
WBC mixed population: 1123 cells/uL — ABNORMAL HIGH (ref 200–950)
WBC: 10.4 10*3/uL (ref 3.8–10.8)

## 2017-08-08 LAB — COMPLETE METABOLIC PANEL WITH GFR
AG Ratio: 1 (calc) (ref 1.0–2.5)
ALBUMIN MSPROF: 3.5 g/dL — AB (ref 3.6–5.1)
ALT: 8 U/L — AB (ref 9–46)
AST: 15 U/L (ref 10–35)
Alkaline phosphatase (APISO): 60 U/L (ref 40–115)
BUN / CREAT RATIO: 11 (calc) (ref 6–22)
BUN: 13 mg/dL (ref 7–25)
CO2: 24 mmol/L (ref 20–32)
CREATININE: 1.18 mg/dL — AB (ref 0.70–1.11)
Calcium: 9.6 mg/dL (ref 8.6–10.3)
Chloride: 101 mmol/L (ref 98–110)
GFR, EST AFRICAN AMERICAN: 67 mL/min/{1.73_m2} (ref >=60)
GFR, Est Non African American: 58 mL/min/{1.73_m2} — ABNORMAL LOW (ref >=60)
GLUCOSE: 169 mg/dL — AB (ref 65–99)
Globulin: 3.5 g/dL (calc) (ref 1.9–3.7)
Potassium: 5.5 mmol/L — ABNORMAL HIGH (ref 3.5–5.3)
Sodium: 140 mmol/L (ref 135–146)
TOTAL PROTEIN: 7 g/dL (ref 6.1–8.1)
Total Bilirubin: 0.7 mg/dL (ref 0.2–1.2)

## 2017-08-11 ENCOUNTER — Ambulatory Visit (INDEPENDENT_AMBULATORY_CARE_PROVIDER_SITE_OTHER): Payer: Medicare HMO | Admitting: Family Medicine

## 2017-08-11 ENCOUNTER — Encounter: Payer: Self-pay | Admitting: Family Medicine

## 2017-08-11 VITALS — BP 128/68 | HR 62 | Temp 98.2°F | Resp 16 | Ht 74.0 in | Wt 148.0 lb

## 2017-08-11 DIAGNOSIS — R634 Abnormal weight loss: Secondary | ICD-10-CM

## 2017-08-11 DIAGNOSIS — B370 Candidal stomatitis: Secondary | ICD-10-CM

## 2017-08-11 DIAGNOSIS — R1310 Dysphagia, unspecified: Secondary | ICD-10-CM

## 2017-08-11 DIAGNOSIS — E119 Type 2 diabetes mellitus without complications: Secondary | ICD-10-CM

## 2017-08-11 MED ORDER — METFORMIN HCL 850 MG PO TABS: 850.0000 mg | ORAL_TABLET | Freq: Two times a day (BID) | ORAL | 11 refills | Status: DC

## 2017-08-11 NOTE — Progress Notes (Signed)
Subjective:    Patient ID: [name], male    DOB: 1934/10/17, 83 y.o.   MRN: [id number]  HPI  Unfortunately, the patient was recently admitted to the hospital with hyperglycemia and severe dehydration and possible diabetic ketoacidosis.  I have copied relevant portions of his discharge summary below and included them for my reference:  Admit date: 07/29/2017 Discharge date: 08/01/2017  Time spent: > 30 mins  Recommendations for Outpatient Follow-up:  1. PCP   Discharge Diagnoses:      Active Hospital Problems   Diagnosis Date Noted  . DKA (diabetic ketoacidoses) ([place]) 07/29/2017  . Dysphagia 12/06/2013  . Atrial flutter ([place]) 11/19/2008  . Coronary atherosclerosis 11/19/2008  . Essential hypertension 11/19/2008    Resolved Hospital Problems  No resolved problems to display.    Discharge Condition: Stable  Diet recommendation: Heart healthy      Vitals:   07/31/17 1204 07/31/17 1617  BP: 121/69 127/64  Pulse: 75 63  Resp: 19 19  Temp: 98 F (36.7 C) 98.1 F (36.7 C)  SpO2: 100% 99%    History of present illness:  83 yr old male with a history of CAD, HTN, HLD, GERD, Type 2 DM, esophageal stricture presents to the ED with a couple of days of N/V, mild abdominal pain and generalized weakness. Pt unable to tolerate orally due to nausea and therefore wasn't able to take his meds. Denied any fever/chills, diarrhea, chest pain. In the ED, pt was noted to be in DKA with BS >600. Pt was placed on glucostabilizer. Pt also reported difficulty with swallowing solids and sometimes liquid. Pt with hx of esophageal strictures with multiple dilations. Pt admitted for further management.  ADDENDUM: Overnight, pt reported he has no understanding of insulin and its administraton after the diabetes coordinator visited him and his wife twice and discussing with them in details. Today, pt reported feeling much better, BS are better controlled. Able to tolerate orally.  Denies any chest pain, SOB, abdominal pain, N/V/D/C, fever/chills. Diabetes educator spoke to patient, wife and son about insulin requirements and administration. Pt verbalized understanding. Home health RN is set up as well to assist with further needs.  Hospital Course:  Active Problems:   Essential hypertension   Coronary atherosclerosis   Atrial flutter (H[facility])   Dysphagia   DKA (diabetic ketoacidoses) (H[facility])  DKA Resolved A1c >14.0 BS currently normalized with closed anion gap s/p glucostabilizer Start Novolin 70/30 5U BID, pt taught how to use insulin and provided with [facility] RN Discontinue metformin PCP to follow up closely and repeat another A1c to ensure accuracy  Hx of esophageal stricture/dysphagia Hx of multiple dilation, pt was able to tolerate orally with no difficulty SLP consulted, no further recs Continue Pantoprazole PCP to refer pt for esophageal dilation as needed  CAD Chest pain free Home toprol, asa, statin  HTN On toprol  Tobacco abuse Advised cessation   Procedures:  None   Consultations:  None  Discharge Exam: BP 127/64 (BP Location: Left Arm)   Pulse 63   Temp 98.1 F (36.7 C) (Oral)   Resp 19   Ht 6\' 2"  (1.88 m)   Wt 62.6 kg (138 lb 0.1 oz)   SpO2 99%   BMI 17.72 kg/m   General: Alert, awake, oriented Cardiovascular: S1, S2 present, no added hrt sound  Respiratory: Chest clear bilaterally  Discharge Instructions You were cared for by a hospitalist during your hospital stay. If you have any questions about your discharge  medications or the care you received while you were in the hospital after you are discharged, you can call the unit and asked to speak with the hospitalist on call if the hospitalist that took care of you is not available. Once you are discharged, your primary care physician will handle any further medical issues. Please note that NO REFILLS for any discharge medications will be authorized once you are  discharged, as it is imperative that you return to your primary care physician (or establish a relationship with a primary care physician if you do not have one) for your aftercare needs so that they can reassess your need for medications and monitor your lab values.      Discharge Instructions    Diet - low sodium heart healthy   Complete by:  As directed    Increase activity slowly   Complete by:  As directed          Allergies as of 07/31/2017      Reactions   Celebrex [celecoxib] Rash   blisters              Medication List     STOP taking these medications   metFORMIN 500 MG tablet Commonly known as:  GLUCOPHAGE     TAKE these medications   aspirin 325 MG tablet Take 325 mg by mouth daily.   insulin NPH-regular Human (70-30) 100 UNIT/ML injection Commonly known as:  NOVOLIN 70/30 Inject 5 Units into the skin 2 (two) times daily with a meal.   metoprolol succinate 25 MG 24 hr tablet Commonly known as:  TOPROL-XL TAKE 1 TABLET BY MOUTH ONCE DAILY   nitroGLYCERIN 0.4 MG SL tablet Commonly known as:  NITROSTAT Place 0.4 mg under the tongue every 5 (five) minutes as needed for chest pain.   nystatin 100000 UNIT/ML suspension Commonly known as:  MYCOSTATIN Take 5 mLs (500,000 Units total) by mouth 4 (four) times daily.   pantoprazole 40 MG tablet Commonly known as:  PROTONIX TAKE 1 TABLET BY MOUTH EVERY DAY   pravastatin 40 MG tablet Commonly known as:  PRAVACHOL TAKE 1 TABLET BY MOUTH EVERY DAY     08/07/17 Patient is here today for follow-up. Wt Readings from Last 3 Encounters:  08/07/17 144 lb (65.3 kg)  07/31/17 138 lb 0.1 oz (62.6 kg)  07/28/17 142 lb (64.4 kg)   I last saw the patient in October 2018 he weighed 163 pounds.  He has lost almost 20 pounds since I last saw him.  He blames it on inability to swallow.  He has a history of dysphasia and esophageal strictures requiring dilatation.  He states that over the last 2-3 months,  he has had increasing difficulty eating and has stopped eating because of this causing his weight loss.  Roughly around the same time, his sugar spiraled out of control prompting his recent hospitalization.  He is currently only taking 5 units twice a day of his long-acting insulin 70/30.  He has very few blood sugar readings for me to review today.  His fasting blood sugars in the morning are typically between 180 and 200.  His evening blood sugars are between 200-250.  There have been no hypoglycemic episodes.  He is no longer taking metformin.  He continues to report burning pain in his mouth and on his tongue.  He was diagnosed with thrush.  Despite using the mouthwash for 7 days, his symptoms improved approximately 60% but are still present.  On exam today  there is no thick white plaque on his tongue however the tongue is swollen and shows evidence of median rhomboid glossitis.  There is erythema in his oral mucosa but no obvious thrush.  At that time, my plan was:  The patient's hemoglobin A1c's have been relatively well controlled less than 7 for years.  It seems as though the last 3 months, the patient has spiraled out of control with 20-30 pounds of weight loss, progressive dysphasia, hemoglobin A1c greater than 14.  However despite a hemoglobin A1c greater than 14, his sugars are relatively well controlled with very minimal insulin, 10 units/day.  My concern is that whatever is causing the dysphasia in his throat may be causing the sudden worsening of his blood sugars.  Given his age and weight loss, I am concerned about possible malignancy in the esophagus.  I have recommended a GI consult as soon as possible for an EGD given his weight loss and dysphasia.  Meanwhile increase his insulin to 8 units twice daily and recheck next week to make further titrations based on his fasting and 2-hour postprandial blood sugars.  We will continue the nystatin since he is 65% better for an additional week for what  appears to be median rhomboid glossitis.  Follow-up next week  08/11/17 We were unable to get nystatin and had to switch to clotrimazole troches due to back order on nystatin.  However the burning in his mouth continues to improve.  He states that it has almost completely resolved.  He is gained 4 pounds since his last visit which I am happy to see.  He states that his diarrhea has stopped.  His fasting blood sugars are 120-140.  His 2-hour postprandial sugars are 160-180.  These are excellent and he is only taking 8 units twice daily of his insulin.  This leads me to believe that we may be able to control his sugars with oral medication which he would much prefer.  He is not taking metformin yet having not resumed after his hospitalization.  His dysphasia persist and he is scheduled to see the gastroenterologist early March. Past Medical History:  Diagnosis Date  . Acute venous embolism and thrombosis of unspecified deep vessels of lower extremity   . Atrial flutter (HCC)    and SVT that is post ablation  . CAD (coronary artery disease)   . Chronic kidney disease    stones  . Dyslipidemia   . GERD (gastroesophageal reflux disease)   . HTN (hypertension)   . Shortness of breath    on exertion   Past Surgical History:  Procedure Laterality Date  . CARDIAC CATHETERIZATION     s/p cardiac catheterization and bypass surgery as well as EGD with dilation x3, left lower extremity vein stripping  . CORONARY ARTERY BYPASS GRAFT    . ESOPHAGOGASTRODUODENOSCOPY N/A 12/06/2013   Procedure: ESOPHAGOGASTRODUODENOSCOPY (EGD);  Surgeon: Lear Ng, MD;  Location: [facility];  Service: Endoscopy;  Laterality: N/A;  . left knee anthroscopy    . SECONDARY CLOSURE ARM     Current Outpatient Medications on File Prior to Visit  Medication Sig Dispense Refill  . aspirin 325 MG tablet Take 325 mg by mouth daily.     . clotrimazole (MYCELEX) 10 MG troche Take 1 tablet (10 mg total) by mouth 5 (five)  times daily. 70 tablet 1  . insulin NPH-regular Human (NOVOLIN 70/30) (70-30) 100 UNIT/ML injection Inject 5 Units into the skin 2 (two) times daily with a meal.  10 mL 0  . Lancets Misc. MISC Use to check BS TID DX:E11.9 100 each 5  . metoprolol succinate (TOPROL-XL) 25 MG 24 hr tablet TAKE 1 TABLET BY MOUTH ONCE DAILY 30 tablet 6  . nitroGLYCERIN (NITROSTAT) 0.4 MG SL tablet Place 0.4 mg under the tongue every 5 (five) minutes as needed for chest pain.    . pantoprazole (PROTONIX) 40 MG tablet TAKE 1 TABLET BY MOUTH EVERY DAY 30 tablet 11  . pravastatin (PRAVACHOL) 40 MG tablet TAKE 1 TABLET BY MOUTH EVERY DAY 90 tablet 2   No current facility-administered medications on file prior to visit.    Allergies  Allergen Reactions  . Celebrex [Celecoxib] Rash    blisters   Social History   Socioeconomic History  . Marital status: Married    Spouse name: Not on file  . Number of children: Not on file  . Years of education: Not on file  . Highest education level: Not on file  Social Needs  . Financial resource strain: Not on file  . Food insecurity - worry: Not on file  . Food insecurity - inability: Not on file  . Transportation needs - medical: Not on file  . Transportation needs - non-medical: Not on file  Occupational History  . Not on file  Tobacco Use  . Smoking status: Current Every Day Smoker    Packs/day: 1.00    Types: Cigarettes  . Smokeless tobacco: Never Used  . Tobacco comment: greater than 50 pack year history  Substance and Sexual Activity  . Alcohol use: No  . Drug use: No  . Sexual activity: Not on file  Other Topics Concern  . 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      Review of Systems  All other systems reviewed and are negative.      Objective:   Physical Exam  Constitutional: He appears well-developed. He has a  sickly appearance.  HENT:  Mouth/Throat: Posterior oropharyngeal erythema present.  Neck: Neck supple. No JVD present.  Cardiovascular: Normal rate, regular rhythm, normal heart sounds and intact distal pulses.  No murmur heard. Pulmonary/Chest: Effort normal and breath sounds normal. No respiratory distress. He has no wheezes. He has no rales.  Abdominal: Soft. Bowel sounds are normal. He exhibits no distension. There is no tenderness. There is no rebound and no guarding.          Assessment & Plan:  Oral candidiasis  Dysphagia, unspecified type  Weight loss  Diabetes mellitus type 2 in nonobese [facility]) The oral candidiasis is slowly resolving.  Finish clotrimazole troches.  I want the patient to see GI for an EGD given his dysphasia to rule out malignancy in the esophagus as a potential inciting event given his sudden weight loss and dysphagia.  Hopefully is just an esophageal stricture that can be easily remedied with balloon dilatation.  I am happy to see his weight improving.  Blood sugars are surprisingly well controlled on very minimal insulin 8 units twice daily.  Continue 70/30, 8 units twice daily.  Resume metformin 850 mg twice daily.  Recheck sugars in 2 weeks.  I anticipate that we will be able to slowly gradually wean off insulin at that point as his sugars allow.  Recently on his lab work his potassium was elevated at 5.5.  I believe this was  likely due to hemolysis and I will repeat that today and his labs to ensure resolution.

## 2017-08-12 LAB — BASIC METABOLIC PANEL WITH GFR
BUN: 15 mg/dL (ref 7–25)
CO2: 26 mmol/L (ref 20–32)
CREATININE: 1.01 mg/dL (ref 0.70–1.11)
Calcium: 9.2 mg/dL (ref 8.6–10.3)
Chloride: 103 mmol/L (ref 98–110)
GFR, EST AFRICAN AMERICAN: 81 mL/min/{1.73_m2} (ref >=60)
GFR, EST NON AFRICAN AMERICAN: 70 mL/min/{1.73_m2} (ref >=60)
Glucose, Bld: 169 mg/dL — ABNORMAL HIGH (ref 65–99)
Potassium: 5.3 mmol/L (ref 3.5–5.3)
SODIUM: 138 mmol/L (ref 135–146)

## 2017-08-12 LAB — EXTRA LAV TOP TUBE

## 2017-08-15 ENCOUNTER — Ambulatory Visit (HOSPITAL_COMMUNITY)
Admission: RE | Admit: 2017-08-15 | Discharge: 2017-08-15 | Disposition: A | Discharge status: Home / Self Care | Payer: Medicare HMO | Source: Ambulatory Visit | Attending: Cardiology | Admitting: Cardiology

## 2017-08-15 DIAGNOSIS — I679 Cerebrovascular disease, unspecified: Secondary | ICD-10-CM | POA: Insufficient documentation

## 2017-08-18 ENCOUNTER — Other Ambulatory Visit: Payer: Self-pay | Admitting: *Deleted

## 2017-08-18 DIAGNOSIS — I679 Cerebrovascular disease, unspecified: Secondary | ICD-10-CM

## 2017-08-26 ENCOUNTER — Other Ambulatory Visit: Payer: Self-pay | Admitting: Gastroenterology

## 2017-08-26 DIAGNOSIS — R1310 Dysphagia, unspecified: Secondary | ICD-10-CM | POA: Diagnosis not present

## 2017-08-26 DIAGNOSIS — K219 Gastro-esophageal reflux disease without esophagitis: Secondary | ICD-10-CM | POA: Diagnosis not present

## 2017-08-26 DIAGNOSIS — R634 Abnormal weight loss: Secondary | ICD-10-CM | POA: Diagnosis not present

## 2017-08-29 ENCOUNTER — Ambulatory Visit
Admission: RE | Admit: 2017-08-29 | Discharge: 2017-08-29 | Disposition: A | Discharge status: Home / Self Care | Payer: Medicare HMO | Source: Ambulatory Visit | Attending: Gastroenterology | Admitting: Gastroenterology

## 2017-08-29 DIAGNOSIS — R1310 Dysphagia, unspecified: Secondary | ICD-10-CM | POA: Diagnosis not present

## 2017-09-01 ENCOUNTER — Encounter: Payer: Self-pay | Admitting: Family Medicine

## 2017-09-01 ENCOUNTER — Ambulatory Visit (INDEPENDENT_AMBULATORY_CARE_PROVIDER_SITE_OTHER): Payer: Medicare HMO | Admitting: Family Medicine

## 2017-09-01 VITALS — BP 138/72 | HR 68 | Temp 97.5°F | Resp 16 | Ht 74.0 in | Wt 150.0 lb

## 2017-09-01 DIAGNOSIS — E119 Type 2 diabetes mellitus without complications: Secondary | ICD-10-CM | POA: Diagnosis not present

## 2017-09-01 NOTE — Progress Notes (Signed)
Subjective:    Patient ID: [name], male    DOB: 30-May-1935, 83 y.o.   MRN: [id number]  HPI  Unfortunately, the patient was recently admitted to the hospital with hyperglycemia and severe dehydration and possible diabetic ketoacidosis.  I have copied relevant portions of his discharge summary below and included them for my reference:  Admit date: 07/29/2017 Discharge date: 08/01/2017  Time spent: > 30 mins  Recommendations for Outpatient Follow-up:  1. PCP   Discharge Diagnoses:      Active Hospital Problems   Diagnosis Date Noted  . DKA (diabetic ketoacidoses) ([facility]) 07/29/2017  . Dysphagia 12/06/2013  . Atrial flutter ([place]) 11/19/2008  . Coronary atherosclerosis 11/19/2008  . Essential hypertension 11/19/2008    Resolved Hospital Problems  No resolved problems to display.    Discharge Condition: Stable  Diet recommendation: Heart healthy      Vitals:   07/31/17 1204 07/31/17 1617  BP: 121/69 127/64  Pulse: 75 63  Resp: 19 19  Temp: 98 F (36.7 C) 98.1 F (36.7 C)  SpO2: 100% 99%    History of present illness:  83 yr old male with a history of CAD, HTN, HLD, GERD, Type 2 DM, esophageal stricture presents to the ED with a couple of days of N/V, mild abdominal pain and generalized weakness. Pt unable to tolerate orally due to nausea and therefore wasn't able to take his meds. Denied any fever/chills, diarrhea, chest pain. In the ED, pt was noted to be in DKA with BS >600. Pt was placed on glucostabilizer. Pt also reported difficulty with swallowing solids and sometimes liquid. Pt with hx of esophageal strictures with multiple dilations. Pt admitted for further management.  ADDENDUM: Overnight, pt reported he has no understanding of insulin and its administraton after the diabetes coordinator visited him and his wife twice and discussing with them in details. Today, pt reported feeling much better, BS are better controlled. Able to tolerate orally.  Denies any chest pain, SOB, abdominal pain, N/V/D/C, fever/chills. Diabetes educator spoke to patient, wife and son about insulin requirements and administration. Pt verbalized understanding. Home health RN is set up as well to assist with further needs.  Hospital Course:  Active Problems:   Essential hypertension   Coronary atherosclerosis   Atrial flutter (HCC)   Dysphagia   DKA (diabetic ketoacidoses) (HCC)  DKA Resolved A1c >14.0 BS currently normalized with closed anion gap s/p glucostabilizer Start Novolin 70/30 5U BID, pt taught how to use insulin and provided with [facility] RN Discontinue metformin PCP to follow up closely and repeat another A1c to ensure accuracy  Hx of esophageal stricture/dysphagia Hx of multiple dilation, pt was able to tolerate orally with no difficulty SLP consulted, no further recs Continue Pantoprazole PCP to refer pt for esophageal dilation as needed  CAD Chest pain free Home toprol, asa, statin  HTN On toprol  Tobacco abuse Advised cessation   Procedures:  None   Consultations:  None  Discharge Exam: BP 127/64 (BP Location: Left Arm)   Pulse 63   Temp 98.1 F (36.7 C) (Oral)   Resp 19   Ht 6\' 2"  (1.88 m)   Wt 62.6 kg (138 lb 0.1 oz)   SpO2 99%   BMI 17.72 kg/m   General: Alert, awake, oriented Cardiovascular: S1, S2 present, no added hrt sound  Respiratory: Chest clear bilaterally  Discharge Instructions You were cared for by a hospitalist during your hospital stay. If you have any questions about your discharge  medications or the care you received while you were in the hospital after you are discharged, you can call the unit and asked to speak with the hospitalist on call if the hospitalist that took care of you is not available. Once you are discharged, your primary care physician will handle any further medical issues. Please note that NO REFILLS for any discharge medications will be authorized once you are  discharged, as it is imperative that you return to your primary care physician (or establish a relationship with a primary care physician if you do not have one) for your aftercare needs so that they can reassess your need for medications and monitor your lab values.      Discharge Instructions    Diet - low sodium heart healthy   Complete by:  As directed    Increase activity slowly   Complete by:  As directed          Allergies as of 07/31/2017      Reactions   Celebrex [celecoxib] Rash   blisters              Medication List     STOP taking these medications   metFORMIN 500 MG tablet Commonly known as:  GLUCOPHAGE     TAKE these medications   aspirin 325 MG tablet Take 325 mg by mouth daily.   insulin NPH-regular Human (70-30) 100 UNIT/ML injection Commonly known as:  NOVOLIN 70/30 Inject 5 Units into the skin 2 (two) times daily with a meal.   metoprolol succinate 25 MG 24 hr tablet Commonly known as:  TOPROL-XL TAKE 1 TABLET BY MOUTH ONCE DAILY   nitroGLYCERIN 0.4 MG SL tablet Commonly known as:  NITROSTAT Place 0.4 mg under the tongue every 5 (five) minutes as needed for chest pain.   nystatin 100000 UNIT/ML suspension Commonly known as:  MYCOSTATIN Take 5 mLs (500,000 Units total) by mouth 4 (four) times daily.   pantoprazole 40 MG tablet Commonly known as:  PROTONIX TAKE 1 TABLET BY MOUTH EVERY DAY   pravastatin 40 MG tablet Commonly known as:  PRAVACHOL TAKE 1 TABLET BY MOUTH EVERY DAY     08/07/17 Patient is here today for follow-up. Wt Readings from Last 3 Encounters:  09/01/17 150 lb (68 kg)  08/11/17 148 lb (67.1 kg)  08/07/17 144 lb (65.3 kg)   I last saw the patient in October 2018 he weighed 163 pounds.  He has lost almost 20 pounds since I last saw him.  He blames it on inability to swallow.  He has a history of dysphasia and esophageal strictures requiring dilatation.  He states that over the last 2-3 months, he has  had increasing difficulty eating and has stopped eating because of this causing his weight loss.  Roughly around the same time, his sugar spiraled out of control prompting his recent hospitalization.  He is currently only taking 5 units twice a day of his long-acting insulin 70/30.  He has very few blood sugar readings for me to review today.  His fasting blood sugars in the morning are typically between 180 and 200.  His evening blood sugars are between 200-250.  There have been no hypoglycemic episodes.  He is no longer taking metformin.  He continues to report burning pain in his mouth and on his tongue.  He was diagnosed with thrush.  Despite using the mouthwash for 7 days, his symptoms improved approximately 60% but are still present.  On exam today there is  no thick white plaque on his tongue however the tongue is swollen and shows evidence of median rhomboid glossitis.  There is erythema in his oral mucosa but no obvious thrush.  At that time, my plan was:  The patient's hemoglobin A1c's have been relatively well controlled less than 7 for years.  It seems as though the last 3 months, the patient has spiraled out of control with 20-30 pounds of weight loss, progressive dysphasia, hemoglobin A1c greater than 14.  However despite a hemoglobin A1c greater than 14, his sugars are relatively well controlled with very minimal insulin, 10 units/day.  My concern is that whatever is causing the dysphasia in his throat may be causing the sudden worsening of his blood sugars.  Given his age and weight loss, I am concerned about possible malignancy in the esophagus.  I have recommended a GI consult as soon as possible for an EGD given his weight loss and dysphasia.  Meanwhile increase his insulin to 8 units twice daily and recheck next week to make further titrations based on his fasting and 2-hour postprandial blood sugars.  We will continue the nystatin since he is 65% better for an additional week for what appears  to be median rhomboid glossitis.  Follow-up next week  08/11/17 We were unable to get nystatin and had to switch to clotrimazole troches due to back order on nystatin.  However the burning in his mouth continues to improve.  He states that it has almost completely resolved.  He is gained 4 pounds since his last visit which I am happy to see.  He states that his diarrhea has stopped.  His fasting blood sugars are 120-140.  His 2-hour postprandial sugars are 160-180.  These are excellent and he is only taking 8 units twice daily of his insulin.  This leads me to believe that we may be able to control his sugars with oral medication which he would much prefer.  He is not taking metformin yet having not resumed after his hospitalization.  His dysphasia persist and he is scheduled to see the gastroenterologist early March.  At that time, my plan was: The oral candidiasis is slowly resolving.  Finish clotrimazole troches.  I want the patient to see GI for an EGD given his dysphasia to rule out malignancy in the esophagus as a potential inciting event given his sudden weight loss and dysphagia.  Hopefully is just an esophageal stricture that can be easily remedied with balloon dilatation.  I am happy to see his weight improving.  Blood sugars are surprisingly well controlled on very minimal insulin 8 units twice daily.  Continue 70/30, 8 units twice daily.  Resume metformin 850 mg twice daily.  Recheck sugars in 2 weeks.  I anticipate that we will be able to slowly gradually wean off insulin at that point as his sugars allow.  Recently on his lab work his potassium was elevated at 5.5.  I believe this was likely due to hemolysis and I will repeat that today and his labs to ensure resolution.  09/01/17 Since I last saw the patient, he is resume metformin 850 mg by mouth twice a day. He continues to use 70/30 insulin 8 units twice daily. Fasting blood sugars range between 70 and 120. 2 hour postprandial sugars average  between 120-180.  In fact, the patient has over 30 separate blood sugar readings. Only for a higher than 200. 2 of those are due to the patient not taking his insulin. Unfortunately  he is suffering from diarrhea on the metformin Past Medical History:  Diagnosis Date  . Acute venous embolism and thrombosis of unspecified deep vessels of lower extremity   . Atrial flutter (HCC)    and SVT that is post ablation  . CAD (coronary artery disease)   . Chronic kidney disease    stones  . Dyslipidemia   . GERD (gastroesophageal reflux disease)   . HTN (hypertension)   . Shortness of breath    on exertion   Past Surgical History:  Procedure Laterality Date  . CARDIAC CATHETERIZATION     s/p cardiac catheterization and bypass surgery as well as EGD with dilation x3, left lower extremity vein stripping  . CORONARY ARTERY BYPASS GRAFT    . ESOPHAGOGASTRODUODENOSCOPY N/A 12/06/2013   Procedure: ESOPHAGOGASTRODUODENOSCOPY (EGD);  Surgeon: Lear Ng, MD;  Location: [facility];  Service: Endoscopy;  Laterality: N/A;  . left knee anthroscopy    . SECONDARY CLOSURE ARM     Current Outpatient Medications on File Prior to Visit  Medication Sig Dispense Refill  . aspirin 325 MG tablet Take 325 mg by mouth daily.     . clotrimazole (MYCELEX) 10 MG troche Take 1 tablet (10 mg total) by mouth 5 (five) times daily. 70 tablet 1  . insulin NPH-regular Human (NOVOLIN 70/30) (70-30) 100 UNIT/ML injection Inject 5 Units into the skin 2 (two) times daily with a meal. (Patient taking differently: Inject 8 Units into the skin 2 (two) times daily with a meal. ) 10 mL 0  . Lancets Misc. MISC Use to check BS TID DX:E11.9 100 each 5  . metFORMIN (GLUCOPHAGE) 850 MG tablet Take 1 tablet (850 mg total) by mouth 2 (two) times daily with a meal. 60 tablet 11  . metoprolol succinate (TOPROL-XL) 25 MG 24 hr tablet TAKE 1 TABLET BY MOUTH ONCE DAILY 30 tablet 6  . nitroGLYCERIN (NITROSTAT) 0.4 MG SL tablet Place 0.4  mg under the tongue every 5 (five) minutes as needed for chest pain.    . pantoprazole (PROTONIX) 40 MG tablet TAKE 1 TABLET BY MOUTH EVERY DAY 30 tablet 11  . pravastatin (PRAVACHOL) 40 MG tablet TAKE 1 TABLET BY MOUTH EVERY DAY 90 tablet 2   No current facility-administered medications on file prior to visit.    Allergies  Allergen Reactions  . Celebrex [Celecoxib] Rash    blisters   Social History   Socioeconomic History  . Marital status: Married    Spouse name: Not on file  . Number of children: Not on file  . Years of education: Not on file  . Highest education level: Not on file  Social Needs  . Financial resource strain: Not on file  . Food insecurity - worry: Not on file  . Food insecurity - inability: Not on file  . Transportation needs - medical: Not on file  . Transportation needs - non-medical: Not on file  Occupational History  . Not on file  Tobacco Use  . Smoking status: Current Every Day Smoker    Packs/day: 1.00    Types: Cigarettes  . Smokeless tobacco: Never Used  . Tobacco comment: greater than 50 pack year history  Substance and Sexual Activity  . Alcohol use: No  . Drug use: No  . Sexual activity: Not on file  Other Topics Concern  . 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      Review of Systems  All other systems reviewed and are negative.      Objective:   Physical Exam  Constitutional: He appears well-developed. He does not have a sickly appearance.  Neck: Neck supple. No JVD present.  Cardiovascular: Normal rate, regular rhythm, normal heart sounds and intact distal pulses.  No murmur heard. Pulmonary/Chest: Effort normal and breath sounds normal. No respiratory distress. He has no wheezes. He has no rales.  Abdominal: Soft. Bowel sounds are normal. He exhibits no distension. There is no tenderness.  There is no rebound and no guarding.          Assessment & Plan:  Diabetes mellitus type 2 in nonobese [facility]) I am very satisfied with the patient's blood sugars on his current regimen. I tried to reassure the patient that the occasional elevated blood sugar greater than 200 is not clinically significant given his age. If 26 out of 30 blood sugars are well controlled, I am perfectly happy to except his readings. I did give the patient the option of discontinuing metformin and replacing it with Januvia due to the diarrhea. At the present time he defers this

## 2017-09-02 DIAGNOSIS — R69 Illness, unspecified: Secondary | ICD-10-CM | POA: Diagnosis not present

## 2017-09-03 ENCOUNTER — Other Ambulatory Visit: Payer: Self-pay | Admitting: Family Medicine

## 2017-09-03 MED ORDER — GLUCOSE BLOOD VI STRP: ORAL_STRIP | 4 refills | Status: DC

## 2017-09-05 ENCOUNTER — Other Ambulatory Visit (HOSPITAL_COMMUNITY): Payer: Self-pay | Admitting: Gastroenterology

## 2017-09-05 DIAGNOSIS — R1310 Dysphagia, unspecified: Secondary | ICD-10-CM

## 2017-09-08 ENCOUNTER — Other Ambulatory Visit: Payer: Self-pay | Admitting: Family Medicine

## 2017-09-08 DIAGNOSIS — R69 Illness, unspecified: Secondary | ICD-10-CM | POA: Diagnosis not present

## 2017-09-11 ENCOUNTER — Other Ambulatory Visit: Payer: Self-pay | Admitting: Family Medicine

## 2017-09-11 ENCOUNTER — Ambulatory Visit (HOSPITAL_COMMUNITY)
Admission: RE | Admit: 2017-09-11 | Discharge: 2017-09-11 | Disposition: A | Discharge status: Home / Self Care | Payer: Medicare HMO | Source: Ambulatory Visit | Attending: Gastroenterology | Admitting: Gastroenterology

## 2017-09-11 DIAGNOSIS — R1310 Dysphagia, unspecified: Secondary | ICD-10-CM | POA: Diagnosis not present

## 2017-09-11 MED ORDER — PANTOPRAZOLE SODIUM 40 MG PO TBEC: 40.0000 mg | DELAYED_RELEASE_TABLET | Freq: Every day | ORAL | 3 refills | Status: DC

## 2017-09-11 NOTE — Progress Notes (Signed)
Modified Barium Swallow Progress Note  Patient Details  Name: [name] MRN: [id number] Date of Birth: [birth year]  Today's Date: 09/11/2017  Modified Barium Swallow completed.  Full report located under Chart Review in the Imaging Section.  Brief recommendations include the following:  Clinical Impression  Pt exhibited mild pharyngeal dysphagia and suspected esophageal marked by decreased motor strength and reduced epiglottic inversion leading to laryngeal penetration with thin barium to vocal cords without sensation. Chin tuck posture very efficiently narrowed laryngeal vestibule and consistency protected airway. Mild-moderate vallecular residue due to inadequate epiglottic deflection reduced with dry swallow performing a chin tuck. MBS does not diagnose below the level of the UES however esophageal scan noted a pill consumed previously (this morning ?) at distal esophagus obstructing and preventing passage of thin or thick barium. Barium building in esophagus as subsequent trials consumed increasing aspiration risk. SLP called Dr. Kathline Magic office and spoke with RN who will notify MD.    Swallow Evaluation Recommendations   Recommended Consults: Consider GI evaluation   SLP Diet Recommendations: Thin liquid;Regular solids(thin liquids today)   Liquid Administration via: Cup   Medication Administration: Crushed with puree   Supervision: Patient able to self feed   Compensations: Slow rate;Small sips/bites;Chin tuck;Clear throat intermittently;Multiple dry swallows after each bite/sip;Follow solids with liquid(chin tuck with thin liquids)   Postural Changes: Seated upright at 90 degrees;Remain semi-upright after after feeds/meals (Comment)   Oral Care Recommendations: Oral care BID        [name] 09/11/2017,12:43 PM   [name].Ed [employer] [phone]

## 2017-09-17 DIAGNOSIS — H02032 Senile entropion of right lower eyelid: Secondary | ICD-10-CM | POA: Diagnosis not present

## 2017-09-17 DIAGNOSIS — H2011 Chronic iridocyclitis, right eye: Secondary | ICD-10-CM | POA: Diagnosis not present

## 2017-09-17 DIAGNOSIS — H02135 Senile ectropion of left lower eyelid: Secondary | ICD-10-CM | POA: Diagnosis not present

## 2017-09-17 DIAGNOSIS — H16213 Exposure keratoconjunctivitis, bilateral: Secondary | ICD-10-CM | POA: Diagnosis not present

## 2017-09-17 DIAGNOSIS — H59091 Other disorders of the right eye following cataract surgery: Secondary | ICD-10-CM | POA: Diagnosis not present

## 2017-10-04 ENCOUNTER — Other Ambulatory Visit: Payer: Self-pay | Admitting: Family Medicine

## 2017-10-04 DIAGNOSIS — R69 Illness, unspecified: Secondary | ICD-10-CM | POA: Diagnosis not present

## 2017-10-14 ENCOUNTER — Ambulatory Visit (INDEPENDENT_AMBULATORY_CARE_PROVIDER_SITE_OTHER): Payer: Medicare HMO | Admitting: Family Medicine

## 2017-10-14 ENCOUNTER — Ambulatory Visit
Admission: RE | Admit: 2017-10-14 | Discharge: 2017-10-14 | Disposition: A | Discharge status: Home / Self Care | Payer: Medicare HMO | Source: Ambulatory Visit | Attending: Family Medicine | Admitting: Family Medicine

## 2017-10-14 ENCOUNTER — Other Ambulatory Visit: Payer: Self-pay | Admitting: Family Medicine

## 2017-10-14 ENCOUNTER — Encounter: Payer: Self-pay | Admitting: Family Medicine

## 2017-10-14 VITALS — BP 140/60 | HR 78 | Temp 97.9°F | Resp 14 | Ht 74.0 in | Wt 146.0 lb

## 2017-10-14 DIAGNOSIS — M542 Cervicalgia: Secondary | ICD-10-CM

## 2017-10-14 DIAGNOSIS — M791 Myalgia, unspecified site: Secondary | ICD-10-CM

## 2017-10-14 DIAGNOSIS — M545 Low back pain, unspecified: Secondary | ICD-10-CM

## 2017-10-14 DIAGNOSIS — M25511 Pain in right shoulder: Secondary | ICD-10-CM

## 2017-10-14 DIAGNOSIS — E119 Type 2 diabetes mellitus without complications: Secondary | ICD-10-CM | POA: Diagnosis not present

## 2017-10-14 LAB — CBC WITH DIFFERENTIAL/PLATELET
Basophils Absolute: 62 cells/uL (ref 0–200)
Basophils Relative: 0.9 %
EOS PCT: 3.5 %
Eosinophils Absolute: 242 cells/uL (ref 15–500)
HCT: 37.2 % — ABNORMAL LOW (ref 38.5–50.0)
Hemoglobin: 12.6 g/dL — ABNORMAL LOW (ref 13.2–17.1)
Lymphs Abs: 1725 cells/uL (ref 850–3900)
MCH: 29.7 pg (ref 27.0–33.0)
MCHC: 33.9 g/dL (ref 32.0–36.0)
MCV: 87.7 fL (ref 80.0–100.0)
MONOS PCT: 13.9 %
MPV: 10.3 fL (ref 7.5–12.5)
Neutro Abs: 3912 cells/uL (ref 1500–7800)
Neutrophils Relative %: 56.7 %
PLATELETS: 222 10*3/uL (ref 140–400)
RBC: 4.24 10*6/uL (ref 4.20–5.80)
RDW: 13.7 % (ref 11.0–15.0)
TOTAL LYMPHOCYTE: 25 %
WBC mixed population: 959 cells/uL — ABNORMAL HIGH (ref 200–950)
WBC: 6.9 10*3/uL (ref 3.8–10.8)

## 2017-10-14 LAB — COMPLETE METABOLIC PANEL WITH GFR
AG Ratio: 1.2 (calc) (ref 1.0–2.5)
ALT: 7 U/L — AB (ref 9–46)
AST: 14 U/L (ref 10–35)
Albumin: 4 g/dL (ref 3.6–5.1)
Alkaline phosphatase (APISO): 63 U/L (ref 40–115)
BILIRUBIN TOTAL: 0.5 mg/dL (ref 0.2–1.2)
BUN / CREAT RATIO: 14 (calc) (ref 6–22)
BUN: 18 mg/dL (ref 7–25)
CHLORIDE: 108 mmol/L (ref 98–110)
CO2: 27 mmol/L (ref 20–32)
CREATININE: 1.27 mg/dL — AB (ref 0.70–1.11)
Calcium: 9.8 mg/dL (ref 8.6–10.3)
GFR, EST AFRICAN AMERICAN: 61 mL/min/{1.73_m2} (ref >=60)
GFR, Est Non African American: 53 mL/min/{1.73_m2} — ABNORMAL LOW (ref >=60)
GLUCOSE: 126 mg/dL — AB (ref 65–99)
Globulin: 3.4 g/dL (calc) (ref 1.9–3.7)
Potassium: 5.3 mmol/L (ref 3.5–5.3)
Sodium: 142 mmol/L (ref 135–146)
TOTAL PROTEIN: 7.4 g/dL (ref 6.1–8.1)

## 2017-10-14 LAB — CK: CK TOTAL: 35 U/L — AB (ref 44–196)

## 2017-10-14 LAB — SEDIMENTATION RATE: Sed Rate: 33 mm/h — ABNORMAL HIGH (ref 0–20)

## 2017-10-14 MED ORDER — GLUCOSE BLOOD VI STRP: ORAL_STRIP | 12 refills | Status: DC

## 2017-10-14 NOTE — Progress Notes (Signed)
Subjective:    Patient ID: [name], male    DOB: [birth year], 83 y.o.   MRN: [id number]  HPI Patient states that over the last several weeks, he is developed severe pain in multiple joints and muscle groups.  He is having pain in his neck.  He describes the pain is almost like a "crick".  However he has had it for several weeks.  He is tender to palpation on the lateral transverse processes bilaterally up and down the cervical spine.  He is also tender to palpation with muscle tightness and soreness in the cervical paraspinal muscles bilaterally as well as in the trapezius.  He also complains of pain in his right shoulder.  However there is no decreased range of motion.  He has full abduction without pain.  Passive range of motion demonstrates no crepitus.  It does not exacerbate his pain or trigger pain.  He states that his shoulder aches and throbs constantly regardless of what he is doing.  He denies any pain in his left shoulder.  He is also complaining pain in his lower back.  This tends to be in the latissimus dorsi bilaterally.  It is located from around L4 down to his tailbone.  He denies any sciatica or numbness or weakness in his legs.  He denies any knee pain or ankle pain.  He denies any wrist pain or elbow pain.  Unfortunately recently his wife died from medical complications.  He is not sleeping well. Past Medical History:  Diagnosis Date  . Acute venous embolism and thrombosis of unspecified deep vessels of lower extremity   . Atrial flutter (HCC)    and SVT that is post ablation  . CAD (coronary artery disease)   . Chronic kidney disease    stones  . Dyslipidemia   . GERD (gastroesophageal reflux disease)   . HTN (hypertension)   . Shortness of breath    on exertion   Past Surgical History:  Procedure Laterality Date  . CARDIAC CATHETERIZATION     s/p cardiac catheterization and bypass surgery as well as EGD with dilation x3, left lower extremity vein stripping  .  CORONARY ARTERY BYPASS GRAFT    . ESOPHAGOGASTRODUODENOSCOPY N/A 12/06/2013   Procedure: ESOPHAGOGASTRODUODENOSCOPY (EGD);  Surgeon: Lear Ng, MD;  Location: [facility];  Service: Endoscopy;  Laterality: N/A;  . left knee anthroscopy    . SECONDARY CLOSURE ARM     Current Outpatient Medications on File Prior to Visit  Medication Sig Dispense Refill  . aspirin 325 MG tablet Take 325 mg by mouth daily.     . BD INSULIN SYRINGE U/F 31G X 5/16" 0.3 ML MISC USE WITH NOVOLIN TWICE DAY WITH A MEAL 100 each 5  . Blood Glucose Monitoring Suppl (ONETOUCH VERIO FLEX SYSTEM) w/Device KIT USE TO CHECK BLOOD SUGAR (DX:E11.9) 1 kit 1  . clotrimazole (MYCELEX) 10 MG troche Take 1 tablet (10 mg total) by mouth 5 (five) times daily. 70 tablet 1  . glucose blood (ACCU-CHEK AVIVA PLUS) test strip Use to check BS 3-6 times per day  DX:E11.9 300 each 4  . insulin NPH-regular Human (NOVOLIN 70/30) (70-30) 100 UNIT/ML injection Inject 5 Units into the skin 2 (two) times daily with a meal. (Patient taking differently: Inject 8 Units into the skin 2 (two) times daily with a meal. ) 10 mL 0  . Lancets Misc. MISC Use to check BS TID DX:E11.9 100 each 5  . metFORMIN (GLUCOPHAGE) 850 MG  tablet Take 1 tablet (850 mg total) by mouth 2 (two) times daily with a meal. 60 tablet 11  . metoprolol succinate (TOPROL-XL) 25 MG 24 hr tablet TAKE 1 TABLET BY MOUTH ONCE DAILY 30 tablet 6  . nitroGLYCERIN (NITROSTAT) 0.4 MG SL tablet Place 0.4 mg under the tongue every 5 (five) minutes as needed for chest pain.    . pantoprazole (PROTONIX) 40 MG tablet Take 1 tablet (40 mg total) by mouth daily. 90 tablet 3  . pravastatin (PRAVACHOL) 40 MG tablet TAKE 1 TABLET BY MOUTH EVERY DAY 90 tablet 2   No current facility-administered medications on file prior to visit.    Allergies  Allergen Reactions  . Celebrex [Celecoxib] Rash    blisters   Social History   Socioeconomic History  . Marital status: Married    Spouse name:  Not on file  . Number of children: Not on file  . Years of education: Not on file  . Highest education level: Not on file  Occupational History  . Not on file  Social Needs  . Financial resource strain: Not on file  . Food insecurity:    Worry: Not on file    Inability: Not on file  . Transportation needs:    Medical: Not on file    Non-medical: Not on file  Tobacco Use  . Smoking status: Current Every Day Smoker    Packs/day: 1.00    Types: Cigarettes  . Smokeless tobacco: Never Used  . Tobacco comment: greater than 50 pack year history  Substance and Sexual Activity  . Alcohol use: No  . Drug use: No  . Sexual activity: Not on file  Lifestyle  . Physical activity:    Days per week: Not on file    Minutes per session: Not on file  . Stress: Not on file  Relationships  . Social connections:    Talks on phone: Not on file    Gets together: Not on file    Attends religious service: Not on file    Active member of club or organization: Not on file    Attends meetings of clubs or organizations: Not on file    Relationship status: Not on file  . Intimate partner violence:    Fear of current or ex partner: Not on file    Emotionally abused: Not on file    Physically abused: Not on file    Forced sexual activity: Not on file  Other Topics Concern  . 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       Review of Systems  All other systems reviewed and are negative.      Objective:   Physical Exam  Constitutional: He is oriented to person, place, and time. He appears well-developed and well-nourished. No distress.  Neck: Normal range of motion. Neck supple. No JVD present. No tracheal deviation present.  Cardiovascular: Normal rate, regular rhythm and normal heart sounds. Exam reveals no gallop and no friction rub.  No murmur  heard. Pulmonary/Chest: Breath sounds normal. No respiratory distress. He has no wheezes. He has no rales.  Musculoskeletal:       Right shoulder: He exhibits pain. He exhibits normal range of motion, no tenderness, no effusion, no crepitus, no deformity and no spasm.       Cervical back: He exhibits decreased  range of motion, tenderness, bony tenderness, pain and spasm.       Lumbar back: He exhibits tenderness and pain. He exhibits no bony tenderness, no swelling, no edema, no deformity and no spasm.       Back:  Lymphadenopathy:    He has no cervical adenopathy.  Neurological: He is alert and oriented to person, place, and time. He exhibits normal muscle tone. Coordination normal.  Skin: No rash noted. He is not diaphoretic. No erythema.  Vitals reviewed.         Assessment & Plan:  Myalgia - Plan: CBC with Differential/Platelet, COMPLETE METABOLIC PANEL WITH GFR, CK, Sedimentation rate  Diabetes mellitus type 2 in nonobese (HCC) - Plan: Hemoglobin A1c  Neck pain - Plan: DG Cervical Spine Complete  Acute pain of right shoulder  Acute midline low back pain without sciatica  Differential diagnosis includes osteoarthritis in multiple joints, statin induced myopathy, autoimmune diseases such as PMR.  Begin by obtaining an x-ray of the cervical spine given the fact this is his worst pain.  I will also obtain a CBC, CMP, CK to evaluate for evidence of statin induced myopathy, and I will check a sedimentation rate to evaluate for possible underlying autoimmune diseases such as PMR.  I have asked the patient just continue pravastatin temporarily for 1 week to see if symptoms improve.  I will check a hemoglobin A1c to assess management of his diabetes.  He continues to complain of diarrhea on metformin.  I have given the option of discontinuing metformin and replacing it with name brand medication such as invokana or jardiance.  He declines the change at the present time and elects to  continue the metformin despite the diarrhea.  If sedimentation rate is extremely high, we may try the patient empirically on a trial of prednisone for possible PMR.  His sedimentation rate is normal, I believe the most likely explanation would be diffuse osteoarthritis unless his muscle pain improves after holding pravastatin for 1 week.

## 2017-10-15 LAB — HEMOGLOBIN A1C
EAG (MMOL/L): 10.1 (calc)
Hgb A1c MFr Bld: 8 % of total Hgb — ABNORMAL HIGH (ref <5.7)
Mean Plasma Glucose: 183 (calc)

## 2017-10-17 ENCOUNTER — Other Ambulatory Visit: Payer: Self-pay | Admitting: Family Medicine

## 2017-10-17 MED ORDER — MELOXICAM 7.5 MG PO TABS: 7.5000 mg | ORAL_TABLET | Freq: Every day | ORAL | 2 refills | Status: DC

## 2017-10-21 ENCOUNTER — Other Ambulatory Visit: Payer: Self-pay | Admitting: Family Medicine

## 2017-10-31 ENCOUNTER — Ambulatory Visit
Admission: RE | Admit: 2017-10-31 | Discharge: 2017-10-31 | Disposition: A | Discharge status: Home / Self Care | Payer: Medicare HMO | Source: Ambulatory Visit | Attending: Family Medicine | Admitting: Family Medicine

## 2017-10-31 ENCOUNTER — Encounter: Payer: Self-pay | Admitting: Family Medicine

## 2017-10-31 ENCOUNTER — Ambulatory Visit (INDEPENDENT_AMBULATORY_CARE_PROVIDER_SITE_OTHER): Payer: Medicare HMO | Admitting: Family Medicine

## 2017-10-31 VITALS — BP 118/48 | HR 72 | Temp 97.7°F | Resp 18 | Ht 74.0 in | Wt 141.0 lb

## 2017-10-31 DIAGNOSIS — R0600 Dyspnea, unspecified: Secondary | ICD-10-CM | POA: Diagnosis not present

## 2017-10-31 DIAGNOSIS — R221 Localized swelling, mass and lump, neck: Secondary | ICD-10-CM

## 2017-10-31 DIAGNOSIS — R8290 Unspecified abnormal findings in urine: Secondary | ICD-10-CM

## 2017-10-31 DIAGNOSIS — J449 Chronic obstructive pulmonary disease, unspecified: Secondary | ICD-10-CM | POA: Diagnosis not present

## 2017-10-31 LAB — URINALYSIS, ROUTINE W REFLEX MICROSCOPIC
BILIRUBIN URINE: NEGATIVE
Glucose, UA: NEGATIVE
Ketones, ur: NEGATIVE
Leukocytes, UA: NEGATIVE
Nitrite: NEGATIVE
Protein, ur: NEGATIVE
Specific Gravity, Urine: 1.02 (ref 1.001–1.03)
pH: 5 (ref 5.0–8.0)

## 2017-10-31 LAB — MICROSCOPIC MESSAGE

## 2017-10-31 NOTE — Progress Notes (Signed)
Subjective:    Patient ID: [name], male    DOB: [birth year], 83 y.o.   MRN: [id number]  HPI  10/14/17 Patient states that over the last several weeks, he is developed severe pain in multiple joints and muscle groups.  He is having pain in his neck.  He describes the pain is almost like a "crick".  However he has had it for several weeks.  He is tender to palpation on the lateral transverse processes bilaterally up and down the cervical spine.  He is also tender to palpation with muscle tightness and soreness in the cervical paraspinal muscles bilaterally as well as in the trapezius.  He also complains of pain in his right shoulder.  However there is no decreased range of motion.  He has full abduction without pain.  Passive range of motion demonstrates no crepitus.  It does not exacerbate his pain or trigger pain.  He states that his shoulder aches and throbs constantly regardless of what he is doing.  He denies any pain in his left shoulder.  He is also complaining pain in his lower back.  This tends to be in the latissimus dorsi bilaterally.  It is located from around L4 down to his tailbone.  He denies any sciatica or numbness or weakness in his legs.  He denies any knee pain or ankle pain.  He denies any wrist pain or elbow pain.  Unfortunately recently his wife died from medical complications.  He is not sleeping well. At that time, my plan was: Differential diagnosis includes osteoarthritis in multiple joints, statin induced myopathy, autoimmune diseases such as PMR.  Begin by obtaining an x-ray of the cervical spine given the fact this is his worst pain.  I will also obtain a CBC, CMP, CK to evaluate for evidence of statin induced myopathy, and I will check a sedimentation rate to evaluate for possible underlying autoimmune diseases such as PMR.  I have asked the patient just continue pravastatin temporarily for 1 week to see if symptoms improve.  I will check a hemoglobin A1c to assess  management of his diabetes.  He continues to complain of diarrhea on metformin.  I have given the option of discontinuing metformin and replacing it with name brand medication such as invokana or jardiance.  He declines the change at the present time and elects to continue the metformin despite the diarrhea.  If sedimentation rate is extremely high, we may try the patient empirically on a trial of prednisone for possible PMR.  His sedimentation rate is normal, I believe the most likely explanation would be diffuse osteoarthritis unless his muscle pain improves after holding pravastatin for 1 week.  10/31/17 Patient feels much worse.  He has lost 5 pounds since his last visit.  He has no appetite.  He is losing weight.  There is now a painful mass in the right side of his neck.  It seems deep to the sternocleidomastoid muscle.  Is poorly defined.  There are not firm borders but there is a prominent fullness/lump in that area.  He reports dyspnea on exertion.  He reports diffuse myalgias and arthralgias.  Worse bone pain in his lower back around the level of L4 slightly to the left of midline.  He reports foul odor in his urine, he reports dark yellow urine sounding as though he may be dehydrated.  He reports fevers subjectively and chills.  He reports hot flashes.  All of these symptoms raise the concern about  possible underlying malignancy Past Medical History:  Diagnosis Date  . Acute venous embolism and thrombosis of unspecified deep vessels of lower extremity   . Atrial flutter (HCC)    and SVT that is post ablation  . CAD (coronary artery disease)   . Chronic kidney disease    stones  . Dyslipidemia   . GERD (gastroesophageal reflux disease)   . HTN (hypertension)   . Shortness of breath    on exertion   Past Surgical History:  Procedure Laterality Date  . CARDIAC CATHETERIZATION     s/p cardiac catheterization and bypass surgery as well as EGD with dilation x3, left lower extremity vein  stripping  . CORONARY ARTERY BYPASS GRAFT    . ESOPHAGOGASTRODUODENOSCOPY N/A 12/06/2013   Procedure: ESOPHAGOGASTRODUODENOSCOPY (EGD);  Surgeon: Lear Ng, MD;  Location: [facility];  Service: Endoscopy;  Laterality: N/A;  . left knee anthroscopy    . SECONDARY CLOSURE ARM     Current Outpatient Medications on File Prior to Visit  Medication Sig Dispense Refill  . aspirin 325 MG tablet Take 325 mg by mouth daily.     . BD INSULIN SYRINGE U/F 31G X 5/16" 0.3 ML MISC USE WITH NOVOLIN TWICE DAY WITH A MEAL 100 each 5  . clotrimazole (MYCELEX) 10 MG troche Take 1 tablet (10 mg total) by mouth 5 (five) times daily. 70 tablet 1  . glucose blood (ONE TOUCH ULTRA TEST) test strip Check BS tid 100 each 12  . insulin NPH-regular Human (NOVOLIN 70/30) (70-30) 100 UNIT/ML injection Inject 8 Units into the skin 2 (two) times daily with a meal. 10 mL 3  . Lancets Misc. MISC Use to check BS TID DX:E11.9 100 each 5  . meloxicam (MOBIC) 7.5 MG tablet Take 1 tablet (7.5 mg total) by mouth daily. 30 tablet 2  . metFORMIN (GLUCOPHAGE) 850 MG tablet Take 1 tablet (850 mg total) by mouth 2 (two) times daily with a meal. 60 tablet 11  . metoprolol succinate (TOPROL-XL) 25 MG 24 hr tablet TAKE 1 TABLET BY MOUTH ONCE DAILY 30 tablet 6  . nitroGLYCERIN (NITROSTAT) 0.4 MG SL tablet Place 0.4 mg under the tongue every 5 (five) minutes as needed for chest pain.    . pantoprazole (PROTONIX) 40 MG tablet Take 1 tablet (40 mg total) by mouth daily. 90 tablet 3  . pravastatin (PRAVACHOL) 40 MG tablet TAKE 1 TABLET BY MOUTH EVERY DAY 90 tablet 2   No current facility-administered medications on file prior to visit.    Allergies  Allergen Reactions  . Celebrex [Celecoxib] Rash    blisters   Social History   Socioeconomic History  . Marital status: Married    Spouse name: Not on file  . Number of children: Not on file  . Years of education: Not on file  . Highest education level: Not on file    Occupational History  . Not on file  Social Needs  . Financial resource strain: Not on file  . Food insecurity:    Worry: Not on file    Inability: Not on file  . Transportation needs:    Medical: Not on file    Non-medical: Not on file  Tobacco Use  . Smoking status: Current Every Day Smoker    Packs/day: 1.00    Types: Cigarettes  . Smokeless tobacco: Never Used  . Tobacco comment: greater than 50 pack year history  Substance and Sexual Activity  . Alcohol use: No  . Drug  use: No  . Sexual activity: Not on file  Lifestyle  . Physical activity:    Days per week: Not on file    Minutes per session: Not on file  . Stress: Not on file  Relationships  . Social connections:    Talks on phone: Not on file    Gets together: Not on file    Attends religious service: Not on file    Active member of club or organization: Not on file    Attends meetings of clubs or organizations: Not on file    Relationship status: Not on file  . Intimate partner violence:    Fear of current or ex partner: Not on file    Emotionally abused: Not on file    Physically abused: Not on file    Forced sexual activity: Not on file  Other Topics Concern  . 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       Review of Systems  All other systems reviewed and are negative.      Objective:   Physical Exam  Constitutional: He is oriented to person, place, and time. He appears well-developed and well-nourished. No distress.  Neck: Normal range of motion. Neck supple. No JVD present. No tracheal deviation present.    Cardiovascular: Normal rate, regular rhythm and normal heart sounds. Exam reveals no gallop and no friction rub.  No murmur heard. Pulmonary/Chest: Breath sounds normal. No respiratory distress. He has no wheezes. He has no rales.  Musculoskeletal:        Right shoulder: He exhibits pain. He exhibits normal range of motion, no tenderness, no effusion, no crepitus, no deformity and no spasm.       Cervical back: He exhibits decreased range of motion, tenderness, bony tenderness, pain and spasm.       Lumbar back: He exhibits tenderness and pain. He exhibits no bony tenderness, no swelling, no edema, no deformity and no spasm.       Back:  Lymphadenopathy:    He has no cervical adenopathy.  Neurological: He is alert and oriented to person, place, and time. He exhibits normal muscle tone. Coordination normal.  Skin: No rash noted. He is not diaphoretic. No erythema.  Vitals reviewed.         Assessment & Plan:  Dyspnea, unspecified type - Plan: CBC with Differential/Platelet, COMPLETE METABOLIC PANEL WITH GFR, TSH, DG Chest 2 View  Abnormal urine odor - Plan: Urinalysis, Routine w reflex microscopic  Localized swelling, mass or lump of neck - Plan: CT Soft Tissue Neck W Contrast  I am concerned about possible underlying malignancy such as leukemia or lymphoma or multiple myeloma.  Have recommended a CT scan of the neck to evaluate the soft tissue swelling in the neck to rule out an abnormal lymph node.  Obtain urinalysis given the abnormal urine odor, obtain chest x-ray given the dyspnea on exertion, check CBC, TSH, CMP.  Await the results of the above studies and then proceed with further diagnostic work-up.  If there is significant proteinuria in the urinalysis, obtain an SPEP.

## 2017-11-01 LAB — CBC WITH DIFFERENTIAL/PLATELET
BASOS ABS: 53 {cells}/uL (ref 0–200)
Basophils Relative: 0.6 %
EOS ABS: 326 {cells}/uL (ref 15–500)
Eosinophils Relative: 3.7 %
HEMATOCRIT: 36.1 % — AB (ref 38.5–50.0)
HEMOGLOBIN: 11.9 g/dL — AB (ref 13.2–17.1)
Lymphs Abs: 1751 cells/uL (ref 850–3900)
MCH: 28.7 pg (ref 27.0–33.0)
MCHC: 33 g/dL (ref 32.0–36.0)
MCV: 87.2 fL (ref 80.0–100.0)
MONOS PCT: 14.5 %
MPV: 10.2 fL (ref 7.5–12.5)
NEUTROS ABS: 5394 {cells}/uL (ref 1500–7800)
Neutrophils Relative %: 61.3 %
Platelets: 312 10*3/uL (ref 140–400)
RBC: 4.14 10*6/uL — ABNORMAL LOW (ref 4.20–5.80)
RDW: 13.5 % (ref 11.0–15.0)
Total Lymphocyte: 19.9 %
WBC mixed population: 1276 cells/uL — ABNORMAL HIGH (ref 200–950)
WBC: 8.8 10*3/uL (ref 3.8–10.8)

## 2017-11-01 LAB — COMPLETE METABOLIC PANEL WITH GFR
AG RATIO: 1.1 (calc) (ref 1.0–2.5)
ALT: 8 U/L — ABNORMAL LOW (ref 9–46)
AST: 15 U/L (ref 10–35)
Albumin: 3.6 g/dL (ref 3.6–5.1)
Alkaline phosphatase (APISO): 61 U/L (ref 40–115)
BUN/Creatinine Ratio: 21 (calc) (ref 6–22)
BUN: 27 mg/dL — ABNORMAL HIGH (ref 7–25)
CALCIUM: 9.3 mg/dL (ref 8.6–10.3)
CO2: 25 mmol/L (ref 20–32)
Chloride: 105 mmol/L (ref 98–110)
Creat: 1.3 mg/dL — ABNORMAL HIGH (ref 0.70–1.11)
GFR, EST AFRICAN AMERICAN: 60 mL/min/{1.73_m2} (ref >=60)
GFR, EST NON AFRICAN AMERICAN: 52 mL/min/{1.73_m2} — AB (ref >=60)
GLOBULIN: 3.3 g/dL (ref 1.9–3.7)
Glucose, Bld: 103 mg/dL — ABNORMAL HIGH (ref 65–99)
Potassium: 5.1 mmol/L (ref 3.5–5.3)
SODIUM: 140 mmol/L (ref 135–146)
Total Bilirubin: 0.5 mg/dL (ref 0.2–1.2)
Total Protein: 6.9 g/dL (ref 6.1–8.1)

## 2017-11-01 LAB — TSH: TSH: 2.68 m[IU]/L (ref 0.40–4.50)

## 2017-11-03 ENCOUNTER — Other Ambulatory Visit: Payer: Self-pay | Admitting: Family Medicine

## 2017-11-03 DIAGNOSIS — R319 Hematuria, unspecified: Secondary | ICD-10-CM

## 2017-11-03 DIAGNOSIS — R69 Illness, unspecified: Secondary | ICD-10-CM | POA: Diagnosis not present

## 2017-11-05 ENCOUNTER — Other Ambulatory Visit: Payer: Self-pay | Admitting: *Deleted

## 2017-11-05 ENCOUNTER — Ambulatory Visit
Admission: RE | Admit: 2017-11-05 | Discharge: 2017-11-05 | Disposition: A | Discharge status: Home / Self Care | Payer: Medicare HMO | Source: Ambulatory Visit | Attending: Family Medicine | Admitting: Family Medicine

## 2017-11-05 DIAGNOSIS — R319 Hematuria, unspecified: Secondary | ICD-10-CM

## 2017-11-05 DIAGNOSIS — N200 Calculus of kidney: Secondary | ICD-10-CM | POA: Diagnosis not present

## 2017-11-05 MED ORDER — LEVOFLOXACIN 500 MG PO TABS: 500.0000 mg | ORAL_TABLET | Freq: Every day | ORAL | 0 refills | Status: DC

## 2017-11-11 ENCOUNTER — Other Ambulatory Visit: Payer: Self-pay | Admitting: Family Medicine

## 2017-11-11 DIAGNOSIS — R221 Localized swelling, mass and lump, neck: Secondary | ICD-10-CM

## 2017-11-14 ENCOUNTER — Ambulatory Visit (INDEPENDENT_AMBULATORY_CARE_PROVIDER_SITE_OTHER): Payer: Medicare HMO | Admitting: Family Medicine

## 2017-11-14 ENCOUNTER — Encounter: Payer: Self-pay | Admitting: Family Medicine

## 2017-11-14 VITALS — BP 126/64 | HR 67 | Temp 97.8°F | Resp 16 | Ht 74.0 in | Wt 144.0 lb

## 2017-11-14 DIAGNOSIS — J181 Lobar pneumonia, unspecified organism: Secondary | ICD-10-CM | POA: Diagnosis not present

## 2017-11-14 DIAGNOSIS — R221 Localized swelling, mass and lump, neck: Secondary | ICD-10-CM | POA: Diagnosis not present

## 2017-11-14 DIAGNOSIS — F4329 Adjustment disorder with other symptoms: Secondary | ICD-10-CM | POA: Diagnosis not present

## 2017-11-14 DIAGNOSIS — Z634 Disappearance and death of family member: Secondary | ICD-10-CM | POA: Diagnosis not present

## 2017-11-14 DIAGNOSIS — M791 Myalgia, unspecified site: Secondary | ICD-10-CM | POA: Diagnosis not present

## 2017-11-14 DIAGNOSIS — R0600 Dyspnea, unspecified: Secondary | ICD-10-CM | POA: Diagnosis not present

## 2017-11-14 DIAGNOSIS — F4321 Adjustment disorder with depressed mood: Secondary | ICD-10-CM

## 2017-11-14 DIAGNOSIS — J189 Pneumonia, unspecified organism: Secondary | ICD-10-CM

## 2017-11-14 DIAGNOSIS — R69 Illness, unspecified: Secondary | ICD-10-CM | POA: Diagnosis not present

## 2017-11-14 MED ORDER — PAROXETINE HCL 20 MG PO TABS: 20.0000 mg | ORAL_TABLET | Freq: Every day | ORAL | 5 refills | Status: DC

## 2017-11-14 NOTE — Progress Notes (Signed)
Subjective:    Patient ID: [name], male    DOB: [birth year], 83 y.o.   MRN: [id number]  HPI  10/14/17 Patient states that over the last several weeks, he is developed severe pain in multiple joints and muscle groups.  He is having pain in his neck.  He describes the pain is almost like a "crick".  However he has had it for several weeks.  He is tender to palpation on the lateral transverse processes bilaterally up and down the cervical spine.  He is also tender to palpation with muscle tightness and soreness in the cervical paraspinal muscles bilaterally as well as in the trapezius.  He also complains of pain in his right shoulder.  However there is no decreased range of motion.  He has full abduction without pain.  Passive range of motion demonstrates no crepitus.  It does not exacerbate his pain or trigger pain.  He states that his shoulder aches and throbs constantly regardless of what he is doing.  He denies any pain in his left shoulder.  He is also complaining pain in his lower back.  This tends to be in the latissimus dorsi bilaterally.  It is located from around L4 down to his tailbone.  He denies any sciatica or numbness or weakness in his legs.  He denies any knee pain or ankle pain.  He denies any wrist pain or elbow pain.  Unfortunately recently his wife died from medical complications.  He is not sleeping well. At that time, my plan was: Differential diagnosis includes osteoarthritis in multiple joints, statin induced myopathy, autoimmune diseases such as PMR.  Begin by obtaining an x-ray of the cervical spine given the fact this is his worst pain.  I will also obtain a CBC, CMP, CK to evaluate for evidence of statin induced myopathy, and I will check a sedimentation rate to evaluate for possible underlying autoimmune diseases such as PMR.  I have asked the patient just continue pravastatin temporarily for 1 week to see if symptoms improve.  I will check a hemoglobin A1c to assess  management of his diabetes.  He continues to complain of diarrhea on metformin.  I have given the option of discontinuing metformin and replacing it with name brand medication such as invokana or jardiance.  He declines the change at the present time and elects to continue the metformin despite the diarrhea.  If sedimentation rate is extremely high, we may try the patient empirically on a trial of prednisone for possible PMR.  His sedimentation rate is normal, I believe the most likely explanation would be diffuse osteoarthritis unless his muscle pain improves after holding pravastatin for 1 week.  10/31/17 Patient feels much worse.  He has lost 5 pounds since his last visit.  He has no appetite.  He is losing weight.  There is now a painful mass in the right side of his neck.  It seems deep to the sternocleidomastoid muscle.  Is poorly defined.  There are not firm borders but there is a prominent fullness/lump in that area.  He reports dyspnea on exertion.  He reports diffuse myalgias and arthralgias.  Worse bone pain in his lower back around the level of L4 slightly to the left of midline.  He reports foul odor in his urine, he reports dark yellow urine sounding as though he may be dehydrated.  He reports fevers subjectively and chills.  He reports hot flashes.  All of these symptoms raise the concern about  possible underlying malignancy.  At that time, my plan was: I am concerned about possible underlying malignancy such as leukemia or lymphoma or multiple myeloma.  Have recommended a CT scan of the neck to evaluate the soft tissue swelling in the neck to rule out an abnormal lymph node.  Obtain urinalysis given the abnormal urine odor, obtain chest x-ray given the dyspnea on exertion, check CBC, TSH, CMP.  Await the results of the above studies and then proceed with further diagnostic work-up.  If there is significant proteinuria in the urinalysis, obtain an SPEP.  11/14/17 No visits with results within 2  Week(s) from this visit.  Latest known visit with results is:  Office Visit on 10/31/2017  Component Date Value Ref Range Status  . WBC 10/31/2017 8.8  3.8 - 10.8 Thousand/uL Final  . RBC 10/31/2017 4.14* 4.20 - 5.80 Million/uL Final  . Hemoglobin 10/31/2017 11.9* 13.2 - 17.1 g/dL Final  . HCT 10/31/2017 36.1* 38.5 - 50.0 % Final  . MCV 10/31/2017 87.2  80.0 - 100.0 fL Final  . MCH 10/31/2017 28.7  27.0 - 33.0 pg Final  . MCHC 10/31/2017 33.0  32.0 - 36.0 g/dL Final  . RDW 10/31/2017 13.5  11.0 - 15.0 % Final  . Platelets 10/31/2017 312  140 - 400 Thousand/uL Final  . MPV 10/31/2017 10.2  7.5 - 12.5 fL Final  . Neutro Abs 10/31/2017 5,394  1,500 - 7,800 cells/uL Final  . Lymphs Abs 10/31/2017 1,751  850 - 3,900 cells/uL Final  . WBC mixed population 10/31/2017 1,276* 200 - 950 cells/uL Final  . Eosinophils Absolute 10/31/2017 326  15 - 500 cells/uL Final  . Basophils Absolute 10/31/2017 53  0 - 200 cells/uL Final  . Neutrophils Relative % 10/31/2017 61.3  % Final  . Total Lymphocyte 10/31/2017 19.9  % Final  . Monocytes Relative 10/31/2017 14.5  % Final  . Eosinophils Relative 10/31/2017 3.7  % Final  . Basophils Relative 10/31/2017 0.6  % Final  . Glucose, Bld 10/31/2017 103* 65 - 99 mg/dL Final   Comment: .            Fasting reference interval . For someone without known diabetes, a glucose value between 100 and 125 mg/dL is consistent with prediabetes and should be confirmed with a follow-up test. .   . BUN 10/31/2017 27* 7 - 25 mg/dL Final  . Creat 10/31/2017 1.30* 0.70 - 1.11 mg/dL Final   Comment: For patients >42 years of age, the reference limit for Creatinine is approximately 13% higher for people identified as African-American. .   . GFR, Est Non African American 10/31/2017 52* > OR = 60 mL/min/1.79m Final  . GFR, Est African American 10/31/2017 60  > OR = 60 mL/min/1.741mFinal  . BUN/Creatinine Ratio 10/31/2017 21  6 - 22 (calc) Final  . Sodium 10/31/2017 140   135 - 146 mmol/L Final  . Potassium 10/31/2017 5.1  3.5 - 5.3 mmol/L Final  . Chloride 10/31/2017 105  98 - 110 mmol/L Final  . CO2 10/31/2017 25  20 - 32 mmol/L Final  . Calcium 10/31/2017 9.3  8.6 - 10.3 mg/dL Final  . Total Protein 10/31/2017 6.9  6.1 - 8.1 g/dL Final  . Albumin 10/31/2017 3.6  3.6 - 5.1 g/dL Final  . Globulin 10/31/2017 3.3  1.9 - 3.7 g/dL (calc) Final  . AG Ratio 10/31/2017 1.1  1.0 - 2.5 (calc) Final  . Total Bilirubin 10/31/2017 0.5  0.2 - 1.2 mg/dL  Final  . Alkaline phosphatase (APISO) 10/31/2017 61  40 - 115 U/L Final  . AST 10/31/2017 15  10 - 35 U/L Final  . ALT 10/31/2017 8* 9 - 46 U/L Final  . TSH 10/31/2017 2.68  0.40 - 4.50 mIU/L Final  . Color, Urine 10/31/2017 YELLOW  YELLOW Final  . APPearance 10/31/2017 CLEAR  CLEAR Final  . Specific Gravity, Urine 10/31/2017 1.020  1.001 - 1.03 Final  . pH 10/31/2017 5.0  5.0 - 8.0 Final  . Glucose, UA 10/31/2017 NEGATIVE  NEGATIVE Final  . Bilirubin Urine 10/31/2017 NEGATIVE  NEGATIVE Final   Comment: Verified by repeat analysis. .   Tenna Child, ur 10/31/2017 NEGATIVE  NEGATIVE Final  . Hgb urine dipstick 10/31/2017 1+* NEGATIVE Final  . Protein, ur 10/31/2017 NEGATIVE  NEGATIVE Final  . Nitrite 10/31/2017 NEGATIVE  NEGATIVE Final  . Leukocytes, UA 10/31/2017 NEGATIVE  NEGATIVE Final  . WBC, UA 10/31/2017 0-5  0 - 5 /HPF Final  . RBC / HPF 10/31/2017 3-10* 0 - 2 /HPF Final  . Squamous Epithelial / LPF 10/31/2017 0-5  < OR = 5 /HPF Final  . Bacteria, UA 10/31/2017 FEW* NONE SEEN /HPF Final  . Note 10/31/2017    Final   Comment: This urine was analyzed for the presence of WBC,  RBC, bacteria, casts, and other formed elements.  Only those elements seen were reported. . .    Labs showed hematuria prompting CT to evaluate for renal stones.  Result is below: IMPRESSION: 1. Pneumonia in the medial aspect of the posterior segment right lower lobe.  2. Renal calculi bilaterally, more on the left than on the  right. No hydronephrosis or ureteral calculus on either side.  3. There is a degree of urinary bladder wall thickening, felt to represent cystitis.  4. Extensive sigmoid diverticulosis without diverticulitis. No bowel obstruction. No abscess. Appendix appears normal.  5. Aortoiliac and common femoral artery atherosclerosis. No aneurysm. There also coronary artery calcifications.  Thankfully no malignancy was seen in the chest or in the abdomen and pelvis.  Patient was started on Levaquin for pneumonia and is here today for follow-up.  It is very difficult to get a direct answer from the patient.  I asked him if he feels he is better, if his body aches are better, if his fatigue is better.  He simply states I do not know doc.  His granddaughter who is with him states that he is doing much better.  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  She states that clinically he is doing better however he continues to suffer from severe fatigue.  The painful mass in the right side of his neck has resolved after antibiotics making me believe this was likely a reactive lymph node due to his occult pneumonia.  He continues to complain of pain in the right side of his neck along the distribution of the trapezius but this seems to be muscular pain exacerbated by underlying arthritis.  As far as the diffuse body aches all over, this is resolved.  I asked the patient how he is doing after the death of his wife and he quietly begins to cry.  His granddaughter states that he is very depressed.  They were married 73 years.  He seems lost without her according to his granddaughter.  She believes this may be a large part of what is causing his various somatic complaints. Past  Medical History:  Diagnosis Date  . Acute venous embolism and thrombosis of unspecified deep vessels of lower extremity   . Atrial flutter (HCC)    and SVT that is post ablation  . CAD (coronary artery  disease)   . Chronic kidney disease    stones  . Dyslipidemia   . GERD (gastroesophageal reflux disease)   . HTN (hypertension)   . Shortness of breath    on exertion   Past Surgical History:  Procedure Laterality Date  . CARDIAC CATHETERIZATION     s/p cardiac catheterization and bypass surgery as well as EGD with dilation x3, left lower extremity vein stripping  . CORONARY ARTERY BYPASS GRAFT    . ESOPHAGOGASTRODUODENOSCOPY N/A 12/06/2013   Procedure: ESOPHAGOGASTRODUODENOSCOPY (EGD);  Surgeon: Lear Ng, MD;  Location: [facility];  Service: Endoscopy;  Laterality: N/A;  . left knee anthroscopy    . SECONDARY CLOSURE ARM     Current Outpatient Medications on File Prior to Visit  Medication Sig Dispense Refill  . aspirin 325 MG tablet Take 325 mg by mouth daily.     . BD INSULIN SYRINGE U/F 31G X 5/16" 0.3 ML MISC USE WITH NOVOLIN TWICE DAY WITH A MEAL 100 each 5  . clotrimazole (MYCELEX) 10 MG troche Take 1 tablet (10 mg total) by mouth 5 (five) times daily. 70 tablet 1  . glucose blood (ONE TOUCH ULTRA TEST) test strip Check BS tid 100 each 12  . insulin NPH-regular Human (NOVOLIN 70/30) (70-30) 100 UNIT/ML injection Inject 8 Units into the skin 2 (two) times daily with a meal. 10 mL 3  . Lancets Misc. MISC Use to check BS TID DX:E11.9 100 each 5  . meloxicam (MOBIC) 7.5 MG tablet Take 1 tablet (7.5 mg total) by mouth daily. 30 tablet 2  . metFORMIN (GLUCOPHAGE) 850 MG tablet Take 1 tablet (850 mg total) by mouth 2 (two) times daily with a meal. 60 tablet 11  . metoprolol succinate (TOPROL-XL) 25 MG 24 hr tablet TAKE 1 TABLET BY MOUTH ONCE DAILY 30 tablet 6  . nitroGLYCERIN (NITROSTAT) 0.4 MG SL tablet Place 0.4 mg under the tongue every 5 (five) minutes as needed for chest pain.    . pravastatin (PRAVACHOL) 40 MG tablet TAKE 1 TABLET BY MOUTH EVERY DAY 90 tablet 2  . pantoprazole (PROTONIX) 40 MG tablet Take 1 tablet (40 mg total) by mouth daily. (Patient not taking:  Reported on 11/14/2017) 90 tablet 3   No current facility-administered medications on file prior to visit.    Allergies  Allergen Reactions  . Celebrex [Celecoxib] Rash    blisters   Social History   Socioeconomic History  . Marital status: Married    Spouse name: Not on file  . Number of children: Not on file  . Years of education: Not on file  . Highest education level: Not on file  Occupational History  . Not on file  Social Needs  . Financial resource strain: Not on file  . Food insecurity:    Worry: Not on file    Inability: Not on file  . Transportation needs:    Medical: Not on file    Non-medical: Not on file  Tobacco Use  . Smoking status: Current Every Day Smoker    Packs/day: 1.00    Types: Cigarettes  . Smokeless tobacco: Never Used  . Tobacco comment: greater than 50 pack year history  Substance and Sexual Activity  . Alcohol use: No  .  Drug use: No  . Sexual activity: Not on file  Lifestyle  . Physical activity:    Days per week: Not on file    Minutes per session: Not on file  . Stress: Not on file  Relationships  . Social connections:    Talks on phone: Not on file    Gets together: Not on file    Attends religious service: Not on file    Active member of club or organization: Not on file    Attends meetings of clubs or organizations: Not on file    Relationship status: Not on file  . Intimate partner violence:    Fear of current or ex partner: Not on file    Emotionally abused: Not on file    Physically abused: Not on file    Forced sexual activity: Not on file  Other Topics Concern  . 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       Review of Systems  All other systems reviewed and are negative.      Objective:   Physical Exam  Constitutional: He is oriented to person, place, and time. He appears  well-developed and well-nourished. No distress.  Neck: Normal range of motion. Neck supple. No JVD present. No tracheal deviation present.  Cardiovascular: Normal rate, regular rhythm and normal heart sounds. Exam reveals no gallop and no friction rub.  No murmur heard. Pulmonary/Chest: Breath sounds normal. No respiratory distress. He has no wheezes. He has no rales.  Musculoskeletal:       Cervical back: He exhibits decreased range of motion, tenderness, bony tenderness, pain and spasm.       Lumbar back: He exhibits pain. He exhibits no tenderness, no bony tenderness, no swelling, no edema, no deformity and no spasm.       Back:  Lymphadenopathy:    He has no cervical adenopathy.  Neurological: He is alert and oriented to person, place, and time. He exhibits normal muscle tone. Coordination normal.  Skin: No rash noted. He is not diaphoretic. No erythema.  Vitals reviewed.         Assessment & Plan:  Neck mass  Dyspnea, unspecified type  Myalgia  Pneumonia of right lower lobe due to infectious organism [facility])  Complicated grief - Plan: Ambulatory referral to Psychology  Neck mass has resolved after antibiotics.  I believe this was likely a reactive lymph node to his right sided pneumonia.  His dyspnea is better making me believe that this was related to his pneumonia.  His diffuse myalgias have improved aside from his neck and I believe this could have been due to his pneumonia.  I believe his severe fatigue could be underlying depression.  I have recommended trying Paxil 20 mg a day and then rechecking in 4 to 5 weeks.  I have also recommended grief counseling through hospice and I will try to schedule this for him.

## 2017-11-18 ENCOUNTER — Other Ambulatory Visit: Payer: Medicare HMO

## 2017-11-20 ENCOUNTER — Encounter: Payer: Self-pay | Admitting: Family Medicine

## 2017-11-25 ENCOUNTER — Encounter: Payer: Self-pay | Admitting: Family Medicine

## 2017-11-25 ENCOUNTER — Other Ambulatory Visit: Payer: Self-pay

## 2017-11-25 ENCOUNTER — Ambulatory Visit (INDEPENDENT_AMBULATORY_CARE_PROVIDER_SITE_OTHER): Payer: Medicare HMO | Admitting: Family Medicine

## 2017-11-25 VITALS — BP 112/48 | HR 68 | Temp 98.6°F | Resp 16 | Ht 74.0 in | Wt 138.0 lb

## 2017-11-25 DIAGNOSIS — G8929 Other chronic pain: Secondary | ICD-10-CM

## 2017-11-25 DIAGNOSIS — F329 Major depressive disorder, single episode, unspecified: Secondary | ICD-10-CM | POA: Insufficient documentation

## 2017-11-25 DIAGNOSIS — F321 Major depressive disorder, single episode, moderate: Secondary | ICD-10-CM | POA: Diagnosis not present

## 2017-11-25 DIAGNOSIS — F32A Depression, unspecified: Secondary | ICD-10-CM | POA: Insufficient documentation

## 2017-11-25 DIAGNOSIS — J189 Pneumonia, unspecified organism: Secondary | ICD-10-CM | POA: Diagnosis not present

## 2017-11-25 DIAGNOSIS — R69 Illness, unspecified: Secondary | ICD-10-CM | POA: Diagnosis not present

## 2017-11-25 DIAGNOSIS — N200 Calculus of kidney: Secondary | ICD-10-CM

## 2017-11-25 DIAGNOSIS — M542 Cervicalgia: Secondary | ICD-10-CM | POA: Diagnosis not present

## 2017-11-25 LAB — URINALYSIS, ROUTINE W REFLEX MICROSCOPIC
BILIRUBIN URINE: NEGATIVE
Bacteria, UA: NONE SEEN /HPF
GLUCOSE, UA: NEGATIVE
LEUKOCYTES UA: NEGATIVE
NITRITE: NEGATIVE
SQUAMOUS EPITHELIAL / LPF: NONE SEEN /HPF (ref <=5)
Specific Gravity, Urine: 1.02 (ref 1.001–1.03)
WBC UA: NONE SEEN /HPF (ref 0–5)
pH: 5.5 (ref 5.0–8.0)

## 2017-11-25 LAB — MICROSCOPIC MESSAGE

## 2017-11-25 MED ORDER — LEVOFLOXACIN 500 MG PO TABS: 500.0000 mg | ORAL_TABLET | Freq: Every day | ORAL | 0 refills | Status: DC

## 2017-11-25 MED ORDER — TAMSULOSIN HCL 0.4 MG PO CAPS: 0.4000 mg | ORAL_CAPSULE | Freq: Every day | ORAL | 0 refills | Status: DC

## 2017-11-25 NOTE — Patient Instructions (Addendum)
Take the antibiotics Start flomax with supper Stop the mobic- meloxicam Take 1/2 tablet of the toprol  We will call with results Urology referral He can use topical biofreeze/asprecreme/ICY hot Take tylenol for pain Decrease paxil to 10mg , take 1/2 tablet F/U pending results

## 2017-11-25 NOTE — Progress Notes (Signed)
Subjective:    Patient ID: [name], male    DOB: February 10, 1935, 83 y.o.   MRN: [id number]  Patient presents for Illness (x months- has been treated for PNA- ran fever last night)  Pt here with , he has been seen three times in the past 6 weeks  Has had multiple complaints myalgias neck pain swollen lymph nodes.  He did have blood in his urine therefore this prompted a CT renal.  This did show kidney stones which she has had multiple in the past per report.  At that time however there is no sign that there was any passing stones.  It did however show Right lower lobe pneumonia which he was treated with Levaquin for 7 days.  His granddaughter states that he improved for the past few days has been more sluggish and fatigued he also had a fever yesterday to 75 F.  He has had some cough with congestion.  He also hurts on his left flank region as well as his stomach across.  He has had decreased appetite for the past couple days.  Of note he was also started on Paxil last week secondary to depression symptoms he states that this medicine makes him sick on his stomach.  He continues to complain of neck pain as well he has degenerative disc disease in his neck.  - no change in bowels He did have some blood in urine a few days ago   Grand-daughter present   Review Of Systems:  GEN- + fatigue, +fever, +weight loss,weakness, recent illness HEENT- denies eye drainage, change in vision, nasal discharge, CVS- denies chest pain, palpitations RESP- denies SOB,+ cough, wheeze ABD- denies N/V, change in stools,+ abd pain GU- denies dysuria, hematuria, dribbling, incontinence MSK- + joint pain, muscle aches, injury Neuro- denies headache, dizziness, syncope, seizure activity       Objective:    BP (!) 112/48   Pulse 68   Temp 98.6 F (37 C) (Temporal)   Resp 16   Ht 6\' 2"  (1.88 m)   Wt 138 lb (62.6 kg)   SpO2 93%   BMI 17.72 kg/m  GEN- NAD, alert and oriented x3 HEENT- PERRL, EOMI, non  injected sclera, pink conjunctiva, MMM, oropharynx clear Neck- Supple, no LAD, TTP C spine, decreased ROM with rotation CVS- RRR, no murmur RESP-no wheeze a little decreased at bases, normal WOB, sat 93%, smoker  ABD-NABS,soft,, ND, TTP Left flank, LLQ, no rebound, no guarding, no mass palpated  EXT- No edema Pulses- Radial  2+        Assessment & Plan:      Problem List Items Addressed This Visit      Unprioritized   Depression    Decrease paxil to 10mg  once a day        Other Visit Diagnoses    Community acquired pneumonia, unspecified laterality    -  Primary   probable residual PNA, will treat clinically, check STAT labs and hold on repeat imaging right now, did not show on CXR previously  Will give another 7 days of levaquin, this covers respiratory and urological    Relevant Medications   levofloxacin (LEVAQUIN) 500 MG tablet   Other Relevant Orders   CBC with Differential/Platelet   Comprehensive metabolic panel   Kidney stones       recurrent kidney stones, he state feels like previous, he has less blood in urine today, but wants to see specialist to "do something about them" urology referral  placed He will decreae metoprolol to 12.5mg  since he has borderline low BP already and will start flomax at supper He has been on flomax before He is to stop the Mobic for now    Relevant Orders   Comprehensive metabolic panel   Urinalysis, Routine w reflex microscopic (Completed)   Ambulatory referral to Urology   Chronic neck pain       Has DDD, use tylenol or topical rub      Note: This dictation was prepared with Dragon dictation along with smaller phrase technology. Any transcriptional errors that result from this process are unintentional.

## 2017-11-25 NOTE — Assessment & Plan Note (Signed)
Decrease paxil to 10mg  once a day

## 2017-11-29 ENCOUNTER — Other Ambulatory Visit: Payer: Self-pay | Admitting: Cardiology

## 2017-11-29 LAB — CBC WITH DIFFERENTIAL/PLATELET
BASOS ABS: 50 {cells}/uL (ref 0–200)
BASOS PCT: 0.4 %
EOS ABS: 422 {cells}/uL (ref 15–500)
EOS PCT: 3.4 %
HCT: 36.7 % — ABNORMAL LOW (ref 38.5–50.0)
Hemoglobin: 12.7 g/dL — ABNORMAL LOW (ref 13.2–17.1)
Lymphs Abs: 2096 cells/uL (ref 850–3900)
MCH: 29.1 pg (ref 27.0–33.0)
MCHC: 34.6 g/dL (ref 32.0–36.0)
MCV: 84.2 fL (ref 80.0–100.0)
MONOS PCT: 15.2 %
MPV: 10.4 fL (ref 7.5–12.5)
Neutro Abs: 7948 cells/uL — ABNORMAL HIGH (ref 1500–7800)
Neutrophils Relative %: 64.1 %
PLATELETS: 302 10*3/uL (ref 140–400)
RBC: 4.36 10*6/uL (ref 4.20–5.80)
RDW: 13.3 % (ref 11.0–15.0)
TOTAL LYMPHOCYTE: 16.9 %
WBC mixed population: 1885 cells/uL — ABNORMAL HIGH (ref 200–950)
WBC: 12.4 10*3/uL — ABNORMAL HIGH (ref 3.8–10.8)

## 2017-11-29 LAB — COMPREHENSIVE METABOLIC PANEL
AG Ratio: 0.8 (calc) — ABNORMAL LOW (ref 1.0–2.5)
ALT: 12 U/L (ref 9–46)
AST: 19 U/L (ref 10–35)
Albumin: 3.4 g/dL — ABNORMAL LOW (ref 3.6–5.1)
Alkaline phosphatase (APISO): 57 U/L (ref 40–115)
BUN/Creatinine Ratio: 24 (calc) — ABNORMAL HIGH (ref 6–22)
BUN: 32 mg/dL — ABNORMAL HIGH (ref 7–25)
CO2: 24 mmol/L (ref 20–32)
Calcium: 9.1 mg/dL (ref 8.6–10.3)
Chloride: 104 mmol/L (ref 98–110)
Creat: 1.33 mg/dL — ABNORMAL HIGH (ref 0.70–1.11)
Globulin: 4.2 g/dL (calc) — ABNORMAL HIGH (ref 1.9–3.7)
Glucose, Bld: 115 mg/dL — ABNORMAL HIGH (ref 65–99)
Potassium: 4.7 mmol/L (ref 3.5–5.3)
Sodium: 138 mmol/L (ref 135–146)
Total Bilirubin: 0.9 mg/dL (ref 0.2–1.2)
Total Protein: 7.6 g/dL (ref 6.1–8.1)

## 2017-12-01 NOTE — Telephone Encounter (Signed)
Rx(s) sent to pharmacy electronically.  

## 2017-12-05 DIAGNOSIS — N200 Calculus of kidney: Secondary | ICD-10-CM | POA: Diagnosis not present

## 2017-12-07 DIAGNOSIS — R69 Illness, unspecified: Secondary | ICD-10-CM | POA: Diagnosis not present

## 2017-12-09 ENCOUNTER — Other Ambulatory Visit: Payer: Self-pay | Admitting: Family Medicine

## 2017-12-10 ENCOUNTER — Encounter: Payer: Self-pay | Admitting: Family Medicine

## 2017-12-10 ENCOUNTER — Ambulatory Visit (INDEPENDENT_AMBULATORY_CARE_PROVIDER_SITE_OTHER): Payer: Medicare HMO | Admitting: Family Medicine

## 2017-12-10 ENCOUNTER — Ambulatory Visit: Payer: Medicare HMO | Admitting: Family Medicine

## 2017-12-10 VITALS — BP 122/48 | HR 62 | Temp 98.1°F | Resp 14 | Ht 74.0 in | Wt 143.0 lb

## 2017-12-10 DIAGNOSIS — G8929 Other chronic pain: Secondary | ICD-10-CM

## 2017-12-10 DIAGNOSIS — M545 Low back pain, unspecified: Secondary | ICD-10-CM

## 2017-12-10 DIAGNOSIS — R0600 Dyspnea, unspecified: Secondary | ICD-10-CM | POA: Diagnosis not present

## 2017-12-10 DIAGNOSIS — M542 Cervicalgia: Secondary | ICD-10-CM | POA: Diagnosis not present

## 2017-12-10 DIAGNOSIS — R591 Generalized enlarged lymph nodes: Secondary | ICD-10-CM | POA: Diagnosis not present

## 2017-12-10 DIAGNOSIS — R5382 Chronic fatigue, unspecified: Secondary | ICD-10-CM | POA: Diagnosis not present

## 2017-12-10 DIAGNOSIS — J181 Lobar pneumonia, unspecified organism: Secondary | ICD-10-CM | POA: Diagnosis not present

## 2017-12-10 DIAGNOSIS — J189 Pneumonia, unspecified organism: Secondary | ICD-10-CM

## 2017-12-10 DIAGNOSIS — M898X9 Other specified disorders of bone, unspecified site: Secondary | ICD-10-CM | POA: Diagnosis not present

## 2017-12-10 MED ORDER — HYDROCODONE-ACETAMINOPHEN 5-325 MG PO TABS: 1.0000 | ORAL_TABLET | Freq: Four times a day (QID) | ORAL | 0 refills | Status: DC | PRN

## 2017-12-10 MED ORDER — AZITHROMYCIN 250 MG PO TABS: ORAL_TABLET | ORAL | 0 refills | Status: DC

## 2017-12-10 MED ORDER — IPRATROPIUM-ALBUTEROL 20-100 MCG/ACT IN AERS: 1.0000 | INHALATION_SPRAY | Freq: Four times a day (QID) | RESPIRATORY_TRACT | 1 refills | Status: DC

## 2017-12-10 MED ORDER — PREDNISONE 20 MG PO TABS: ORAL_TABLET | ORAL | 0 refills | Status: DC

## 2017-12-10 NOTE — Progress Notes (Addendum)
Subjective:    Patient ID: [name], male    DOB: [birth year], 83 y.o.   MRN: [id number]  HPI  10/14/17 Patient states that over the last several weeks, he is developed severe pain in multiple joints and muscle groups.  He is having pain in his neck.  He describes the pain is almost like a "crick".  However he has had it for several weeks.  He is tender to palpation on the lateral transverse processes bilaterally up and down the cervical spine.  He is also tender to palpation with muscle tightness and soreness in the cervical paraspinal muscles bilaterally as well as in the trapezius.  He also complains of pain in his right shoulder.  However there is no decreased range of motion.  He has full abduction without pain.  Passive range of motion demonstrates no crepitus.  It does not exacerbate his pain or trigger pain.  He states that his shoulder aches and throbs constantly regardless of what he is doing.  He denies any pain in his left shoulder.  He is also complaining pain in his lower back.  This tends to be in the latissimus dorsi bilaterally.  It is located from around L4 down to his tailbone.  He denies any sciatica or numbness or weakness in his legs.  He denies any knee pain or ankle pain.  He denies any wrist pain or elbow pain.  Unfortunately recently his wife died from medical complications.  He is not sleeping well. At that time, my plan was: Differential diagnosis includes osteoarthritis in multiple joints, statin induced myopathy, autoimmune diseases such as PMR.  Begin by obtaining an x-ray of the cervical spine given the fact this is his worst pain.  I will also obtain a CBC, CMP, CK to evaluate for evidence of statin induced myopathy, and I will check a sedimentation rate to evaluate for possible underlying autoimmune diseases such as PMR.  I have asked the patient just continue pravastatin temporarily for 1 week to see if symptoms improve.  I will check a hemoglobin A1c to assess  management of his diabetes.  He continues to complain of diarrhea on metformin.  I have given the option of discontinuing metformin and replacing it with name brand medication such as invokana or jardiance.  He declines the change at the present time and elects to continue the metformin despite the diarrhea.  If sedimentation rate is extremely high, we may try the patient empirically on a trial of prednisone for possible PMR.  His sedimentation rate is normal, I believe the most likely explanation would be diffuse osteoarthritis unless his muscle pain improves after holding pravastatin for 1 week.  10/31/17 Patient feels much worse.  He has lost 5 pounds since his last visit.  He has no appetite.  He is losing weight.  There is now a painful mass in the right side of his neck.  It seems deep to the sternocleidomastoid muscle.  Is poorly defined.  There are not firm borders but there is a prominent fullness/lump in that area.  He reports dyspnea on exertion.  He reports diffuse myalgias and arthralgias.  Worse bone pain in his lower back around the level of L4 slightly to the left of midline.  He reports foul odor in his urine, he reports dark yellow urine sounding as though he may be dehydrated.  He reports fevers subjectively and chills.  He reports hot flashes.  All of these symptoms raise the concern about  possible underlying malignancy.  At that time, my plan was: I am concerned about possible underlying malignancy such as leukemia or lymphoma or multiple myeloma.  Have recommended a CT scan of the neck to evaluate the soft tissue swelling in the neck to rule out an abnormal lymph node.  Obtain urinalysis given the abnormal urine odor, obtain chest x-ray given the dyspnea on exertion, check CBC, TSH, CMP.  Await the results of the above studies and then proceed with further diagnostic work-up.  If there is significant proteinuria in the urinalysis, obtain an SPEP.  11/14/17 No visits with results within 2  Week(s) from this visit.  Latest known visit with results is:  Office Visit on 11/25/2017  Component Date Value Ref Range Status  . WBC 11/25/2017 12.4* 3.8 - 10.8 Thousand/uL Final  . RBC 11/25/2017 4.36  4.20 - 5.80 Million/uL Final  . Hemoglobin 11/25/2017 12.7* 13.2 - 17.1 g/dL Final  . HCT 11/25/2017 36.7* 38.5 - 50.0 % Final  . MCV 11/25/2017 84.2  80.0 - 100.0 fL Final  . MCH 11/25/2017 29.1  27.0 - 33.0 pg Final  . MCHC 11/25/2017 34.6  32.0 - 36.0 g/dL Final  . RDW 11/25/2017 13.3  11.0 - 15.0 % Final  . Platelets 11/25/2017 302  140 - 400 Thousand/uL Final  . MPV 11/25/2017 10.4  7.5 - 12.5 fL Final  . Neutro Abs 11/25/2017 7,948* 1,500 - 7,800 cells/uL Final  . Lymphs Abs 11/25/2017 2,096  850 - 3,900 cells/uL Final  . WBC mixed population 11/25/2017 1,885* 200 - 950 cells/uL Final  . Eosinophils Absolute 11/25/2017 422  15 - 500 cells/uL Final  . Basophils Absolute 11/25/2017 50  0 - 200 cells/uL Final  . Neutrophils Relative % 11/25/2017 64.1  % Final  . Total Lymphocyte 11/25/2017 16.9  % Final  . Monocytes Relative 11/25/2017 15.2  % Final  . Eosinophils Relative 11/25/2017 3.4  % Final  . Basophils Relative 11/25/2017 0.4  % Final  . Glucose, Bld 11/25/2017 115* 65 - 99 mg/dL Final   Comment: .            Fasting reference interval . For someone without known diabetes, a glucose value between 100 and 125 mg/dL is consistent with prediabetes and should be confirmed with a follow-up test. .   . BUN 11/25/2017 32* 7 - 25 mg/dL Final  . Creat 11/25/2017 1.33* 0.70 - 1.11 mg/dL Final   Comment: For patients >2 years of age, the reference limit for Creatinine is approximately 13% higher for people identified as African-American. .   Havery Moros Ratio 11/25/2017 24* 6 - 22 (calc) Final  . Sodium 11/25/2017 138  135 - 146 mmol/L Final  . Potassium 11/25/2017 4.7  3.5 - 5.3 mmol/L Final  . Chloride 11/25/2017 104  98 - 110 mmol/L Final  . CO2 11/25/2017 24   20 - 32 mmol/L Final  . Calcium 11/25/2017 9.1  8.6 - 10.3 mg/dL Final  . Total Protein 11/25/2017 7.6  6.1 - 8.1 g/dL Final  . Albumin 11/25/2017 3.4* 3.6 - 5.1 g/dL Final  . Globulin 11/25/2017 4.2* 1.9 - 3.7 g/dL (calc) Final  . AG Ratio 11/25/2017 0.8* 1.0 - 2.5 (calc) Final  . Total Bilirubin 11/25/2017 0.9  0.2 - 1.2 mg/dL Final  . Alkaline phosphatase (APISO) 11/25/2017 57  40 - 115 U/L Final  . AST 11/25/2017 19  10 - 35 U/L Final  . ALT 11/25/2017 12  9 - 46 U/L  Final  . Color, Urine 11/25/2017 YELLOW  YELLOW Final  . APPearance 11/25/2017 CLEAR  CLEAR Final  . Specific Gravity, Urine 11/25/2017 1.020  1.001 - 1.03 Final  . pH 11/25/2017 5.5  5.0 - 8.0 Final  . Glucose, UA 11/25/2017 NEGATIVE  NEGATIVE Final  . Bilirubin Urine 11/25/2017 NEGATIVE  NEGATIVE Final   Comment: Verified by repeat analysis. .   Tenna Child, ur 11/25/2017 TRACE* NEGATIVE Final  . Hgb urine dipstick 11/25/2017 TRACE* NEGATIVE Final  . Protein, ur 11/25/2017 2+* NEGATIVE Final  . Nitrite 11/25/2017 NEGATIVE  NEGATIVE Final  . Leukocytes, UA 11/25/2017 NEGATIVE  NEGATIVE Final  . WBC, UA 11/25/2017 NONE SEEN  0 - 5 /HPF Final  . RBC / HPF 11/25/2017 0-2  0 - 2 /HPF Final  . Squamous Epithelial / LPF 11/25/2017 NONE SEEN  < OR = 5 /HPF Final  . Bacteria, UA 11/25/2017 NONE SEEN  NONE SEEN /HPF Final  . Hyaline Cast 11/25/2017 1-3* NONE SEEN /LPF Final  . Granular Cast 11/25/2017 1-3* NONE SEEN /LPF Final  . Note 11/25/2017    Final   Comment: This urine was analyzed for the presence of WBC,  RBC, bacteria, casts, and other formed elements.  Only those elements seen were reported. . .    Labs showed hematuria prompting CT to evaluate for renal stones.  Result is below: IMPRESSION: 1. Pneumonia in the medial aspect of the posterior segment right lower lobe.  2. Renal calculi bilaterally, more on the left than on the right. No hydronephrosis or ureteral calculus on either side.  3. There is a  degree of urinary bladder wall thickening, felt to represent cystitis.  4. Extensive sigmoid diverticulosis without diverticulitis. No bowel obstruction. No abscess. Appendix appears normal.  5. Aortoiliac and common femoral artery atherosclerosis. No aneurysm. There also coronary artery calcifications.  Thankfully no malignancy was seen in the chest or in the abdomen and pelvis.  Patient was started on Levaquin for pneumonia and is here today for follow-up.  It is very difficult to get a direct answer from the patient.  I asked him if he feels he is better, if his body aches are better, if his fatigue is better.  He simply states I do not know doc.  His granddaughter who is with him states that he is doing much better.  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  She states that clinically he is doing better however he continues to suffer from severe fatigue.  The painful mass in the right side of his neck has resolved after antibiotics making me believe this was likely a reactive lymph node due to his occult pneumonia.  He continues to complain of pain in the right side of his neck along the distribution of the trapezius but this seems to be muscular pain exacerbated by underlying arthritis.  As far as the diffuse body aches all over, this is resolved.  I asked the patient how he is doing after the death of his wife and he quietly begins to cry.  His granddaughter states that he is very depressed.  They were married 69 years.  He seems lost without her according to his granddaughter.  She believes this may be a large part of what is causing his various somatic complaints.  At that time, my plan was: Neck mass has resolved after antibiotics.  I believe this was likely a reactive lymph node  to his right sided pneumonia.  His dyspnea is better making me believe that this was related to his pneumonia.  His diffuse myalgias have improved aside from his neck and I  believe this could have been due to his pneumonia.  I believe his severe fatigue could be underlying depression.  I have recommended trying Paxil 20 mg a day and then rechecking in 4 to 5 weeks.  I have also recommended grief counseling through hospice and I will try to schedule this for him.  12/10/17 Subjectively, the patient has decompensated since I last saw him.  He returned later and saw my partner for worsening cough, worsening shortness of breath.  They tried an additional round of antibiotics with no significant improvement.  Today he states that he feels terrible.  He is crying when I enter the room.  He states that he hurts as soon as he wakes up in the morning.  The location of the pain varies.  He can be in his lower back towards the left-hand side.  It also hurts over his left lower ribs to gentle palpation.  He also complains of chronic neck pain.  The reactive lymphadenopathy has returned to the right side of his neck.  He reports chills night sweats subjective fevers, worsening shortness of breath, constant cough, dyspnea on exertion.  He reports abdominal discomfort in the left lower quadrant.  He reports poor appetite.  He reports fatigue Past Medical History:  Diagnosis Date  . Acute venous embolism and thrombosis of unspecified deep vessels of lower extremity   . Atrial flutter (HCC)    and SVT that is post ablation  . CAD (coronary artery disease)   . Chronic kidney disease    stones  . Dyslipidemia   . GERD (gastroesophageal reflux disease)   . HTN (hypertension)   . Shortness of breath    on exertion   Past Surgical History:  Procedure Laterality Date  . CARDIAC CATHETERIZATION     s/p cardiac catheterization and bypass surgery as well as EGD with dilation x3, left lower extremity vein stripping  . CORONARY ARTERY BYPASS GRAFT    . ESOPHAGOGASTRODUODENOSCOPY N/A 12/06/2013   Procedure: ESOPHAGOGASTRODUODENOSCOPY (EGD);  Surgeon: Lear Ng, MD;  Location: [facility];  Service: Endoscopy;  Laterality: N/A;  . left knee anthroscopy    . SECONDARY CLOSURE ARM     Current Outpatient Medications on File Prior to Visit  Medication Sig Dispense Refill  . aspirin 325 MG tablet Take 325 mg by mouth daily.     . BD INSULIN SYRINGE U/F 31G X 5/16" 0.3 ML MISC USE WITH NOVOLIN TWICE DAY WITH A MEAL 100 each 5  . clotrimazole (MYCELEX) 10 MG troche Take 1 tablet (10 mg total) by mouth 5 (five) times daily. 70 tablet 1  . glucose blood (ONE TOUCH ULTRA TEST) test strip Check BS tid 100 each 12  . insulin NPH-regular Human (NOVOLIN 70/30) (70-30) 100 UNIT/ML injection Inject 8 Units into the skin 2 (two) times daily with a meal. 10 mL 3  . Lancets Misc. MISC Use to check BS TID DX:E11.9 100 each 5  . meloxicam (MOBIC) 7.5 MG tablet Take 1 tablet (7.5 mg total) by mouth daily. 30 tablet 2  . metoprolol succinate (TOPROL-XL) 25 MG 24 hr tablet Take 1 tablet (25 mg total) by mouth daily. 30 tablet 3  . nitroGLYCERIN (NITROSTAT) 0.4 MG SL tablet Place 0.4 mg under the tongue every 5 (five) minutes as  needed for chest pain.    . pantoprazole (PROTONIX) 40 MG tablet Take 1 tablet (40 mg total) by mouth daily. 90 tablet 3  . PARoxetine (PAXIL) 20 MG tablet TAKE 1 TABLET BY MOUTH EVERY DAY 90 tablet 2  . pravastatin (PRAVACHOL) 40 MG tablet TAKE 1 TABLET BY MOUTH EVERY DAY 90 tablet 2  . tamsulosin (FLOMAX) 0.4 MG CAPS capsule Take 1 capsule (0.4 mg total) by mouth daily after supper. 30 capsule 0   No current facility-administered medications on file prior to visit.    Allergies  Allergen Reactions  . Celebrex [Celecoxib] Rash    blisters   Social History   Socioeconomic History  . Marital status: Married    Spouse name: Not on file  . Number of children: Not on file  . Years of education: Not on file  . Highest education level: Not on file  Occupational History  . Not on file  Social Needs  . Financial resource strain: Not on file  . Food insecurity:     Worry: Not on file    Inability: Not on file  . Transportation needs:    Medical: Not on file    Non-medical: Not on file  Tobacco Use  . Smoking status: Current Every Day Smoker    Packs/day: 1.00    Types: Cigarettes  . Smokeless tobacco: Never Used  . Tobacco comment: greater than 50 pack year history  Substance and Sexual Activity  . Alcohol use: No  . Drug use: No  . Sexual activity: Not on file  Lifestyle  . Physical activity:    Days per week: Not on file    Minutes per session: Not on file  . Stress: Not on file  Relationships  . Social connections:    Talks on phone: Not on file    Gets together: Not on file    Attends religious service: Not on file    Active member of club or organization: Not on file    Attends meetings of clubs or organizations: Not on file    Relationship status: Not on file  . Intimate partner violence:    Fear of current or ex partner: Not on file    Emotionally abused: Not on file    Physically abused: Not on file    Forced sexual activity: Not on file  Other Topics Concern  . 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       Review of Systems  All other systems reviewed and are negative.      Objective:   Physical Exam  Constitutional: He is oriented to person, place, and time. He appears well-developed and well-nourished. No distress.  Neck: Normal range of motion. Neck supple. No JVD present. Spinous process tenderness and muscular tenderness present. No tracheal deviation present.    Cardiovascular: Normal rate, regular rhythm and normal heart sounds. Exam reveals no gallop and no friction rub.  No murmur heard. Pulmonary/Chest: Effort normal. No respiratory distress. He has wheezes. He has no rales. He exhibits tenderness.  Abdominal: Soft. Bowel sounds are normal. He exhibits no distension.  There is no tenderness. There is no rebound.  Musculoskeletal:       Right shoulder: He exhibits decreased range of motion, tenderness and pain.       Cervical back: He exhibits decreased range  of motion, tenderness, bony tenderness, pain and spasm.       Lumbar back: He exhibits pain. He exhibits no tenderness, no bony tenderness, no swelling, no edema, no deformity and no spasm.       Back:  Lymphadenopathy:    He has cervical adenopathy.  Neurological: He is alert and oriented to person, place, and time. He exhibits normal muscle tone. Coordination normal.  Skin: No rash noted. He is not diaphoretic. No erythema.  Vitals reviewed.         Assessment & Plan:  Community acquired pneumonia, unspecified laterality - Plan: DG Chest 2 View  Chronic neck pain  Dyspnea, unspecified type  Pneumonia of right lower lobe due to infectious organism (HCC)  Chronic fatigue - Plan: DG Lumbar Spine Complete, DG Chest 2 View, US Soft Tissue Head/Neck, CBC with Differential/Platelet, COMPLETE METABOLIC PANEL WITH GFR, Sedimentation rate, Protein electrophoresis, serum  Bone pain - Plan: DG Lumbar Spine Complete, Sedimentation rate, Protein electrophoresis, serum  Lymphadenopathy of head and neck - Plan: US Soft Tissue Head/Neck  Acute left-sided low back pain without sciatica - Plan: DG Lumbar Spine Complete  Patient clinically looks terrible.  I am sending him immediately for a chest x-ray to evaluate for recurrent pneumonia versus a possible pulmonary mass.  I am ordered an x-ray of his lower back as well as of his ribs given the diffuse bone pain that he is having.  I will also be obtaining an SPEP to screen for multiple myeloma.  I will obtain an ultrasound of the neck to evaluate for the reactive lymphadenopathy present on the right-hand side concerning for possible neoplasm.  Meanwhile check a CBC as well as a CMP and a sed rate.  I will treat the patient empirically for COPD exacerbation  and cover for atypical infections with a Z-Pak, prednisone taper pack, and Combivent 1 inhalation every 6 hours.  Await the results of this extensive work-up above.  Addendum 6/20- No lytic lesions are seen in the back but their is another pneumonia.  Begin z pack, add amoxicillin 1 g tid for 10 days, take prednisone and combivent as prescribed.  If worsening, needs to go to hospital for IV abx.  Otherwise, recheck here Monday with Dr. Buelah Manis.  ESR and WBC elevated but again pneumonia seen on CXR.  If not improving, will get dedicated CT of chest to evaluate for malignancy.  Awaiting SPEP still given diffuse bone pain as well as [place] of neck to evaluate for atypical cervical lymph node.

## 2017-12-11 ENCOUNTER — Ambulatory Visit
Admission: RE | Admit: 2017-12-11 | Discharge: 2017-12-11 | Disposition: A | Discharge status: Home / Self Care | Payer: Medicare HMO | Source: Ambulatory Visit | Attending: Family Medicine | Admitting: Family Medicine

## 2017-12-11 ENCOUNTER — Other Ambulatory Visit: Payer: Self-pay | Admitting: Family Medicine

## 2017-12-11 DIAGNOSIS — M545 Low back pain, unspecified: Secondary | ICD-10-CM

## 2017-12-11 DIAGNOSIS — R5382 Chronic fatigue, unspecified: Secondary | ICD-10-CM

## 2017-12-11 DIAGNOSIS — R05 Cough: Secondary | ICD-10-CM | POA: Diagnosis not present

## 2017-12-11 DIAGNOSIS — M47816 Spondylosis without myelopathy or radiculopathy, lumbar region: Secondary | ICD-10-CM | POA: Diagnosis not present

## 2017-12-11 DIAGNOSIS — J189 Pneumonia, unspecified organism: Secondary | ICD-10-CM

## 2017-12-11 DIAGNOSIS — M898X9 Other specified disorders of bone, unspecified site: Secondary | ICD-10-CM

## 2017-12-11 MED ORDER — AMOXICILLIN 500 MG PO CAPS: 1000.0000 mg | ORAL_CAPSULE | Freq: Three times a day (TID) | ORAL | 0 refills | Status: DC

## 2017-12-12 LAB — PROTEIN ELECTROPHORESIS, SERUM
ALBUMIN ELP: 3 g/dL — AB (ref 3.8–4.8)
ALPHA 1: 0.4 g/dL — AB (ref 0.2–0.3)
Alpha 2: 0.9 g/dL (ref 0.5–0.9)
BETA 2: 0.5 g/dL (ref 0.2–0.5)
Beta Globulin: 0.4 g/dL (ref 0.4–0.6)
Gamma Globulin: 1.6 g/dL (ref 0.8–1.7)
TOTAL PROTEIN: 6.8 g/dL (ref 6.1–8.1)

## 2017-12-12 LAB — EXTRA LAV TOP TUBE

## 2017-12-15 ENCOUNTER — Ambulatory Visit: Payer: Medicare HMO | Admitting: Family Medicine

## 2017-12-16 ENCOUNTER — Ambulatory Visit (INDEPENDENT_AMBULATORY_CARE_PROVIDER_SITE_OTHER): Payer: Medicare HMO | Admitting: Family Medicine

## 2017-12-16 ENCOUNTER — Encounter: Payer: Self-pay | Admitting: Family Medicine

## 2017-12-16 VITALS — BP 118/60 | HR 66 | Temp 97.9°F | Resp 12 | Ht 74.0 in | Wt 142.0 lb

## 2017-12-16 DIAGNOSIS — D649 Anemia, unspecified: Secondary | ICD-10-CM | POA: Diagnosis not present

## 2017-12-16 DIAGNOSIS — R0600 Dyspnea, unspecified: Secondary | ICD-10-CM

## 2017-12-16 DIAGNOSIS — R69 Illness, unspecified: Secondary | ICD-10-CM | POA: Diagnosis not present

## 2017-12-16 DIAGNOSIS — R5383 Other fatigue: Secondary | ICD-10-CM | POA: Diagnosis not present

## 2017-12-16 DIAGNOSIS — J189 Pneumonia, unspecified organism: Secondary | ICD-10-CM

## 2017-12-16 LAB — CBC WITH DIFFERENTIAL/PLATELET
BASOS ABS: 79 {cells}/uL (ref 0–200)
Basophils Relative: 0.9 %
EOS ABS: 642 {cells}/uL — AB (ref 15–500)
Eosinophils Relative: 7.3 %
HEMATOCRIT: 31.9 % — AB (ref 38.5–50.0)
HEMOGLOBIN: 10.2 g/dL — AB (ref 13.2–17.1)
LYMPHS ABS: 1874 {cells}/uL (ref 850–3900)
MCH: 27.9 pg (ref 27.0–33.0)
MCHC: 32 g/dL (ref 32.0–36.0)
MCV: 87.4 fL (ref 80.0–100.0)
MONOS PCT: 11.7 %
MPV: 10 fL (ref 7.5–12.5)
NEUTROS ABS: 5174 {cells}/uL (ref 1500–7800)
Neutrophils Relative %: 58.8 %
Platelets: 306 10*3/uL (ref 140–400)
RBC: 3.65 10*6/uL — ABNORMAL LOW (ref 4.20–5.80)
RDW: 14 % (ref 11.0–15.0)
Total Lymphocyte: 21.3 %
WBC mixed population: 1030 cells/uL — ABNORMAL HIGH (ref 200–950)
WBC: 8.8 10*3/uL (ref 3.8–10.8)

## 2017-12-16 LAB — URINALYSIS, ROUTINE W REFLEX MICROSCOPIC
Bacteria, UA: NONE SEEN /HPF
Bilirubin Urine: NEGATIVE
GLUCOSE, UA: NEGATIVE
HYALINE CAST: NONE SEEN /LPF
Hgb urine dipstick: NEGATIVE
Ketones, ur: NEGATIVE
Leukocytes, UA: NEGATIVE
Nitrite: NEGATIVE
SPECIFIC GRAVITY, URINE: 1.025 (ref 1.001–1.03)
Squamous Epithelial / LPF: NONE SEEN /HPF (ref <=5)
pH: 5.5 (ref 5.0–8.0)

## 2017-12-16 LAB — COMPLETE METABOLIC PANEL WITH GFR
AG Ratio: 0.9 (calc) — ABNORMAL LOW (ref 1.0–2.5)
ALBUMIN MSPROF: 3.3 g/dL — AB (ref 3.6–5.1)
ALKALINE PHOSPHATASE (APISO): 52 U/L (ref 40–115)
ALT: 9 U/L (ref 9–46)
AST: 15 U/L (ref 10–35)
BILIRUBIN TOTAL: 0.3 mg/dL (ref 0.2–1.2)
BUN / CREAT RATIO: 21 (calc) (ref 6–22)
BUN: 26 mg/dL — ABNORMAL HIGH (ref 7–25)
CHLORIDE: 105 mmol/L (ref 98–110)
CO2: 24 mmol/L (ref 20–32)
Calcium: 9 mg/dL (ref 8.6–10.3)
Creat: 1.26 mg/dL — ABNORMAL HIGH (ref 0.70–1.11)
GFR, Est African American: 62 mL/min/{1.73_m2} (ref >=60)
GFR, Est Non African American: 53 mL/min/{1.73_m2} — ABNORMAL LOW (ref >=60)
Globulin: 3.8 g/dL (calc) — ABNORMAL HIGH (ref 1.9–3.7)
Glucose, Bld: 78 mg/dL (ref 65–99)
POTASSIUM: 5.1 mmol/L (ref 3.5–5.3)
Sodium: 139 mmol/L (ref 135–146)
Total Protein: 7.1 g/dL (ref 6.1–8.1)

## 2017-12-16 LAB — IRON,TIBC AND FERRITIN PANEL
%SAT: 12 % — AB (ref 20–48)
FERRITIN: 270 ng/mL (ref 24–380)
IRON: 23 ug/dL — AB (ref 50–180)
TIBC: 189 mcg/dL (calc) — ABNORMAL LOW (ref 250–425)

## 2017-12-16 LAB — MICROSCOPIC MESSAGE

## 2017-12-16 LAB — HEMOGLOBIN, FINGERSTICK: POC HEMOGLOBIN: 10.3 g/dL — AB (ref 13.0–17.0)

## 2017-12-16 MED ORDER — IPRATROPIUM-ALBUTEROL 0.5-2.5 (3) MG/3ML IN SOLN
3.0000 mL | Freq: Once | RESPIRATORY_TRACT | Status: AC
Start: 2017-12-16 — End: 2017-12-16
  Administered 2017-12-16: 3 mL via RESPIRATORY_TRACT

## 2017-12-16 MED ORDER — ALBUTEROL SULFATE (2.5 MG/3ML) 0.083% IN NEBU: 2.5000 mg | INHALATION_SOLUTION | Freq: Four times a day (QID) | RESPIRATORY_TRACT | 12 refills | Status: DC | PRN

## 2017-12-16 NOTE — Progress Notes (Signed)
Patient ID: [name], male    DOB: 1935/02/12, 83 y.o.   MRN: [id number]  PCP: Susy Frizzle, MD  Chief Complaint  Patient presents with  . Follow-up    Subjective:   [name] is a 83 y.o. male, presents to clinic with CC of continued SOB, non-productive cough, extreme fatigue and decreased energy, decreased appetite, feels cold a lot.   Reports SOB at rest and with exertion.  No orthopnea, LE edema, PND Cough is wet-sounding but non-productive.  He cannot use the inhaler given to him, cannot twist or inhale strong enough to get meds and he feels no different after attempting to use.  He has finished zpak, is still taking 1g amoxicillin 3x a day.  Feels no improvement. No weightloss, fever, sweats, URI sx, CP. Sugars well managed, at night they are elevated ~ 200, but mornings range 110-130.   No palpitations, near syncope.  History of recent illness, patient has been seen by Dr. Dennard Schaumann his PCP and Dr. Buelah Manis several times over the last month +.  He was diagnosed with pneumonia nearly 3 weeks ago.  He was seen again for recheck 6 days ago and continued (or recurrent) left lower lobe pneumonia.  In the past month he has had 2 courses of Levaquin, 2 courses of Z-Pak, and is currently continuing his amoxicillin dose.  He has been a heavy smoker since was a teenager, reports dx of emphysema.  He has not been on any inhalers or oxygen in the past.  He has not seen a pulmonologist.  At his most recent visit 6 days ago he was given 2 antibiotics, steroid burst and an inhaler but he states that he is unable to use it.  His past visits he did complain about pain all over his body and in various places, today he is does not complain about pain so much.  He does complain about his significant decrease in energy, feeling very fatigued, short of breath and feels very cold.  Patient was hospitalized in February of this year with new diagnosis of diabetes, was in DKA, his diabetes  currently seems to be well-managed.  His last hemoglobin A1c about 2 months ago was 8.  Per his report and his family member (granddaughter) here who is with him sugars appear well controlled with insulin dosing twice a day 8 units.  Morning sugars are 120-130.  He has not tolerated metformin because of too many GI side effects.   Reviewing his lab work since that admission in February, he has had history of anemia, appears to be mild.  Lab work done 11/25/2017 hemoglobin was 12.7.  Repeated lab work 12/10/2017, hemoglobin was down to 10.8.  Patient denies any melena or hematochezia.  Has been on full dose aspirin for many many years from his CABG and stents that were placed.  He has adamantly refused colonoscopy many times before.  Today continues to refuse this and DRE.  He does not want to be kept alive on machines, but he will not state if he had any bleeding what interventions he would want (re: blood products, colonoscopy to stop/treat bleeds).  He is okay with CT scan of the chest to follow up on his multiple CXR, presentations in the past for enlarged lymph nodes, sudden worsening of clinical presentation and illness.        Patient Active Problem List   Diagnosis Date Noted  . Depression 11/25/2017  . DKA (diabetic ketoacidoses) ([place])  07/29/2017  . Lung nodule 01/13/2015  . Food impaction of esophagus 12/06/2013  . Dysphagia 12/06/2013  . Cerebrovascular disease 11/14/2011  . Fatigue 01/08/2011  . Bruit 01/08/2011  . TOBACCO ABUSE 01/11/2010  . ABDOMINAL BRUIT 01/11/2010  . DYSLIPIDEMIA 11/19/2008  . Essential hypertension 11/19/2008  . Coronary atherosclerosis 11/19/2008  . Atrial flutter ([place]) 11/19/2008  . DVT 11/19/2008     Prior to Admission medications   Medication Sig Start Date End Date Taking? Authorizing Provider  amoxicillin (AMOXIL) 500 MG capsule Take 2 capsules (1,000 mg total) by mouth 3 (three) times daily. 12/11/17  Yes Susy Frizzle, MD  aspirin 325 MG tablet  Take 325 mg by mouth daily.    Yes [provider]  BD INSULIN SYRINGE U/F 31G X 5/16" 0.3 ML MISC USE WITH NOVOLIN TWICE DAY WITH A MEAL 09/08/17  Yes Susy Frizzle, MD  clotrimazole (MYCELEX) 10 MG troche Take 1 tablet (10 mg total) by mouth 5 (five) times daily. 08/07/17  Yes Susy Frizzle, MD  glucose blood (ONE TOUCH ULTRA TEST) test strip Check BS tid 10/14/17  Yes Susy Frizzle, MD  HYDROcodone-acetaminophen (NORCO) 5-325 MG tablet Take 1 tablet by mouth every 6 (six) hours as needed for moderate pain. 12/10/17  Yes Susy Frizzle, MD  insulin NPH-regular Human (NOVOLIN 70/30) (70-30) 100 UNIT/ML injection Inject 8 Units into the skin 2 (two) times daily with a meal. 10/21/17  Yes Pickard, Cammie Mcgee, MD  Ipratropium-Albuterol (COMBIVENT RESPIMAT) 20-100 MCG/ACT AERS respimat Inhale 1 puff into the lungs every 6 (six) hours. 12/10/17  Yes Susy Frizzle, MD  Lancets Misc. MISC Use to check BS TID DX:E11.9 08/07/17  Yes Susy Frizzle, MD  meloxicam (MOBIC) 7.5 MG tablet Take 1 tablet (7.5 mg total) by mouth daily. 10/17/17  Yes Susy Frizzle, MD  metoprolol succinate (TOPROL-XL) 25 MG 24 hr tablet Take 1 tablet (25 mg total) by mouth daily. 12/01/17  Yes Lelon Perla, MD  nitroGLYCERIN (NITROSTAT) 0.4 MG SL tablet Place 0.4 mg under the tongue every 5 (five) minutes as needed for chest pain. 11/14/11  Yes Lelon Perla, MD  pantoprazole (PROTONIX) 40 MG tablet Take 1 tablet (40 mg total) by mouth daily. 09/11/17  Yes Susy Frizzle, MD  PARoxetine (PAXIL) 20 MG tablet TAKE 1 TABLET BY MOUTH EVERY DAY 12/09/17  Yes Susy Frizzle, MD  pravastatin (PRAVACHOL) 40 MG tablet TAKE 1 TABLET BY MOUTH EVERY DAY 05/23/17  Yes Lelon Perla, MD  tamsulosin (FLOMAX) 0.4 MG CAPS capsule Take 1 capsule (0.4 mg total) by mouth daily after supper. 11/25/17  Yes [place], Modena Nunnery, MD     Allergies  Allergen Reactions  . Celebrex [Celecoxib] Rash    blisters     No  family history on file.   Social History   Socioeconomic History  . Marital status: Married    Spouse name: Not on file  . Number of children: Not on file  . Years of education: Not on file  . Highest education level: Not on file  Occupational History  . Not on file  Social Needs  . Financial resource strain: Not on file  . Food insecurity:    Worry: Not on file    Inability: Not on file  . Transportation needs:    Medical: Not on file    Non-medical: Not on file  Tobacco Use  . Smoking status: Current Every Day Smoker  Packs/day: 1.00    Types: Cigarettes  . Smokeless tobacco: Never Used  . Tobacco comment: greater than 50 pack year history  Substance and Sexual Activity  . Alcohol use: No  . Drug use: No  . Sexual activity: Not on file  Lifestyle  . Physical activity:    Days per week: Not on file    Minutes per session: Not on file  . Stress: Not on file  Relationships  . Social connections:    Talks on phone: Not on file    Gets together: Not on file    Attends religious service: Not on file    Active member of club or organization: Not on file    Attends meetings of clubs or organizations: Not on file    Relationship status: Not on file  . Intimate partner violence:    Fear of current or ex partner: Not on file    Emotionally abused: Not on file    Physically abused: Not on file    Forced sexual activity: Not on file  Other Topics Concern  . 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      Review of Systems  Constitutional: Positive for activity change, appetite change and fatigue. Negative for chills, diaphoresis, fever and unexpected weight change.  HENT: Negative.   Eyes: Negative.   Respiratory: Positive for cough, shortness of breath and wheezing.   Cardiovascular: Negative.  Negative for chest pain, palpitations  and leg swelling.  Gastrointestinal: Negative for abdominal distention, abdominal pain, anal bleeding, blood in stool, constipation, diarrhea, nausea, rectal pain and vomiting.  Endocrine: Positive for cold intolerance. Negative for polydipsia, polyphagia and polyuria.  Genitourinary: Negative.   Musculoskeletal: Positive for back pain (chronic).  Skin: Negative.  Negative for color change and pallor.  Allergic/Immunologic: Negative.   Neurological: Negative.   Hematological: Positive for adenopathy. Does not bruise/bleed easily.  Psychiatric/Behavioral: Negative.   All other systems reviewed and are negative.      Objective:    Vitals:   12/16/17 1007  BP: 118/60  Pulse: 66  Resp: 12  Temp: 97.9 F (36.6 C)  TempSrc: Oral  SpO2: 97%  Weight: 142 lb (64.4 kg)  Height: 6\' 2"  (1.88 m)      Physical Exam  Constitutional: He is oriented to person, place, and time. He appears well-developed and well-nourished.  Non-toxic appearance. He does not appear ill. No distress.  Thin elderly male, alert, appears stated age, appears tired, mild tachypnea at rest, non-toxic appearing, no respiratory distress  HENT:  Head: Normocephalic and atraumatic.  Right Ear: External ear normal. Decreased hearing is noted.  Left Ear: External ear normal.  Nose: Nose normal. No mucosal edema or rhinorrhea. Right sinus exhibits no maxillary sinus tenderness and no frontal sinus tenderness. Left sinus exhibits no maxillary sinus tenderness and no frontal sinus tenderness.  Mouth/Throat: Uvula is midline and oropharynx is clear and moist. No trismus in the jaw. No uvula swelling. No oropharyngeal exudate, posterior oropharyngeal edema or posterior oropharyngeal erythema.  Eyes: Pupils are equal, round, and reactive to light. Conjunctivae and lids are normal. No scleral icterus.  Neck: Trachea normal, normal range of motion and phonation normal. Neck supple. No tracheal deviation present.  Cardiovascular:  Regular rhythm, normal heart sounds, intact distal pulses and normal pulses. Exam reveals no gallop and no friction  rub.  No murmur heard. Pulses:      Radial pulses are 2+ on the right side, and 2+ on the left side.       Posterior tibial pulses are 2+ on the right side, and 2+ on the left side.  Bradycardic No LE edema  Pulmonary/Chest: Effort normal. No stridor. No respiratory distress. He has wheezes. He has no rhonchi. He has rales. He exhibits no tenderness.  Diminished BS throughout but very diminished b/l at the bases.    Abdominal: Soft. Normal appearance and bowel sounds are normal. He exhibits no distension. There is no tenderness. There is no rebound and no guarding.  Musculoskeletal: He exhibits no edema or deformity.  Lymphadenopathy:    He has no cervical adenopathy.       Right: No supraclavicular adenopathy present.       Left: No supraclavicular adenopathy present.  Neurological: He is alert and oriented to person, place, and time. Coordination and gait normal.  Skin: Skin is warm, dry and intact. Capillary refill takes less than 2 seconds. No rash noted. He is not diaphoretic. No cyanosis. There is pallor (mild pallor). Nails show no clubbing.  Psychiatric: He has a normal mood and affect. His speech is normal and behavior is normal.  Nursing note and vitals reviewed.    EKG:  Sinus Bradycardia, HR 57, normal axis, no ST elevation or depression, no ischemic changes, good R wave progression, 398 msec - Corrected QT Interval (QTc)  07/30/17 EKG reviewed: sinus rhythm with prolonged QTc     Assessment & Plan:   83 year old male, current smoker with a 65-pack-year history, presents for recheck of community acquired pneumonia, dyspnea, fatigue, symptoms have not improved much, he is here for recheck.    ICD-10-CM   1. Community acquired pneumonia, unspecified laterality J18.9 CBC with Differential    COMPLETE METABOLIC PANEL WITH GFR    albuterol (PROVENTIL) (2.5 MG/3ML)  0.083% nebulizer solution    ipratropium-albuterol (DUONEB) 0.5-2.5 (3) MG/3ML nebulizer solution 3 mL    CT Chest Wo Contrast    Check Pulse Oximetry while ambulating  2. Anemia, unspecified type D64.9 Hemoglobin, fingerstick    Iron, TIBC and Ferritin Panel    CBC with Differential    COMPLETE METABOLIC PANEL WITH GFR    CT Chest Wo Contrast    Fecal occult blood, imunochemical    CANCELED: Hemoccult - 1 Card (office)    CANCELED: Fecal Globin By Immunochemistry  3. Dyspnea, unspecified type R06.00 EKG 12-Lead    CBC with Differential    COMPLETE METABOLIC PANEL WITH GFR    albuterol (PROVENTIL) (2.5 MG/3ML) 0.083% nebulizer solution    Fecal occult blood, imunochemical    Check Pulse Oximetry while ambulating  4. Fatigue, unspecified type R53.83 EKG 12-Lead    Urinalysis, Routine w reflex microscopic    Microscopic Message    Fecal occult blood, imunochemical    Check Pulse Oximetry while ambulating    CT chest?    Find source of Hb drop this month?  Iron panel?  Patient refuses digital rectal exam to check for Hemoccult.  He does agree to allow his granddaughter to test his stool, given instructions and they will return to clinic.  Perhaps decrease BB dose to allow him to feel better?  Did discuss taking half dose of metoprolol to see if his generalized symptoms will improve.  I did discuss this with Dr. Buelah Manis and she agrees.  She has even suggested this in past visits  and is documented in her past notes but they did not seem to be doing this already.  Patient appears minimally dyspneic at rest and generally looks fatigued and tired, his vital signs are stable, no hypoxemia, he has a little cough while in the room, and lungs are reexamined, BS improved with neb tx.  Will Rx neb albuterol vials to use 4-6h PRN.  He has been unable to use the previously prescribed inhalers.  Patient's daughter states that she has asthma and he can use her machine  Hb POC test here shows down to  10.2, from 10.8, 6 days ago.  No reports of melena, hematochezia.  I did attempt to have a discussion with the patient and with his granddaughter about if he is willing to find the source of the bleed and to do that he may need to do some procedures such as colonoscopy, which he has adamantly refused in the past.  It is little unclear with the patient would like to do even if there is an identifiable source of blood loss causing anemia and whether or not this is contributing towards his symptoms  Hemoglobin  Date Value Ref Range Status  12/16/2017 10.2 (L) 13.2 - 17.1 g/dL Final  12/10/2017 10.8 (L) 13.2 - 17.1 g/dL Final  11/25/2017 12.7 (L) 13.2 - 17.1 g/dL Final    Ambulatory pulsoximetry - done with Shary Decamp, no desaturation ~ 96%, HR ranged 60-96  Unclear if his fatigue is from his recurrent pneumonia or if it is multifactorial.  And to decrease beta-blocker, work-up for anemia, EKG is reassuring (initially regarding QTC prolongation with use of multiple drugs which further prolonged QTC) there is some mild bradycardia which will improve with decreased beta-blocker dose.  He did feel slightly better with his dyspnea after the nebulizer treatment so we will get that for him at home.   Will follow closely and consult with Dr. Buelah Manis and Dr. Dennard Schaumann re results and follow up.  Delsa Grana, PA-C  12/16/17 11:08 AM

## 2017-12-17 ENCOUNTER — Encounter: Payer: Self-pay | Admitting: Family Medicine

## 2017-12-17 ENCOUNTER — Other Ambulatory Visit: Payer: Self-pay | Admitting: Family Medicine

## 2017-12-18 ENCOUNTER — Other Ambulatory Visit: Payer: Self-pay | Admitting: Family Medicine

## 2017-12-18 DIAGNOSIS — D649 Anemia, unspecified: Secondary | ICD-10-CM

## 2017-12-18 DIAGNOSIS — E611 Iron deficiency: Secondary | ICD-10-CM

## 2017-12-18 NOTE — Progress Notes (Signed)
Please call and follow up with patient and give results:  Iron panel is abnormal, start an over the counter iron supplement.  The iron appears so low that I will refer to hematology because he may need IV iron replacement or more in depth work up.  I do not yet know what caused anemia and low iron.  It can be chronic diseases, poor nutrition, or blood loss, or many other things.  Please return the stool card test so we can make sure he is not loosing blood from GI.  We have ordered a chest CT.  We should hopefully have this scheduled soon.  Please let me know if Mr. Chiou is feeling better.    He should continue to take 1/2 tablet of his metoprolol medicine, I have checked this with Dr. Buelah Manis, and she also suggested this a few weeks ago.

## 2017-12-19 ENCOUNTER — Ambulatory Visit
Admission: RE | Admit: 2017-12-19 | Discharge: 2017-12-19 | Disposition: A | Discharge status: Home / Self Care | Payer: Medicare HMO | Source: Ambulatory Visit | Attending: Family Medicine | Admitting: Family Medicine

## 2017-12-19 ENCOUNTER — Other Ambulatory Visit: Payer: Self-pay | Admitting: *Deleted

## 2017-12-19 ENCOUNTER — Telehealth: Payer: Self-pay | Admitting: Oncology

## 2017-12-19 DIAGNOSIS — R5382 Chronic fatigue, unspecified: Secondary | ICD-10-CM

## 2017-12-19 DIAGNOSIS — R590 Localized enlarged lymph nodes: Secondary | ICD-10-CM | POA: Diagnosis not present

## 2017-12-19 DIAGNOSIS — R591 Generalized enlarged lymph nodes: Secondary | ICD-10-CM

## 2017-12-19 MED ORDER — FERROUS SULFATE 325 (65 FE) MG PO TABS: 325.0000 mg | ORAL_TABLET | Freq: Every day | ORAL | 3 refills | Status: DC

## 2017-12-19 NOTE — Telephone Encounter (Signed)
New referral received from Delsa Grana, [place] for a hematology appointment. Pt has been scheduled with the pt's granddaughter, Allegra Grana, for the pt to see Dr. Alen Blew on 7/17 at 2pm. Ms. Georgiann Cocker is aware for the pt to arrive 30 minutes early.

## 2017-12-22 ENCOUNTER — Other Ambulatory Visit: Payer: Self-pay | Admitting: Family Medicine

## 2017-12-22 DIAGNOSIS — Z008 Encounter for other general examination: Secondary | ICD-10-CM | POA: Diagnosis not present

## 2017-12-23 ENCOUNTER — Ambulatory Visit (INDEPENDENT_AMBULATORY_CARE_PROVIDER_SITE_OTHER): Payer: Medicare HMO | Admitting: Family Medicine

## 2017-12-23 ENCOUNTER — Ambulatory Visit: Payer: Medicare HMO | Admitting: Family Medicine

## 2017-12-23 ENCOUNTER — Encounter: Payer: Self-pay | Admitting: Family Medicine

## 2017-12-23 ENCOUNTER — Ambulatory Visit
Admission: RE | Admit: 2017-12-23 | Discharge: 2017-12-23 | Disposition: A | Discharge status: Home / Self Care | Payer: Medicare HMO | Source: Ambulatory Visit | Attending: Family Medicine | Admitting: Family Medicine

## 2017-12-23 VITALS — BP 110/52 | HR 68 | Temp 97.9°F | Resp 18 | Ht 74.0 in | Wt 144.0 lb

## 2017-12-23 DIAGNOSIS — M545 Low back pain, unspecified: Secondary | ICD-10-CM

## 2017-12-23 DIAGNOSIS — M542 Cervicalgia: Secondary | ICD-10-CM

## 2017-12-23 DIAGNOSIS — G8929 Other chronic pain: Secondary | ICD-10-CM

## 2017-12-23 DIAGNOSIS — R5382 Chronic fatigue, unspecified: Secondary | ICD-10-CM | POA: Diagnosis not present

## 2017-12-23 DIAGNOSIS — J189 Pneumonia, unspecified organism: Secondary | ICD-10-CM

## 2017-12-23 DIAGNOSIS — D649 Anemia, unspecified: Secondary | ICD-10-CM | POA: Diagnosis not present

## 2017-12-23 DIAGNOSIS — J181 Lobar pneumonia, unspecified organism: Secondary | ICD-10-CM

## 2017-12-23 DIAGNOSIS — R918 Other nonspecific abnormal finding of lung field: Secondary | ICD-10-CM | POA: Diagnosis not present

## 2017-12-23 NOTE — Progress Notes (Signed)
Subjective:    Patient ID: [name], male    DOB: [birth year], 83 y.o.   MRN: [id number]  HPI  10/14/17 Patient states that over the last several weeks, he is developed severe pain in multiple joints and muscle groups.  He is having pain in his neck.  He describes the pain is almost like a "crick".  However he has had it for several weeks.  He is tender to palpation on the lateral transverse processes bilaterally up and down the cervical spine.  He is also tender to palpation with muscle tightness and soreness in the cervical paraspinal muscles bilaterally as well as in the trapezius.  He also complains of pain in his right shoulder.  However there is no decreased range of motion.  He has full abduction without pain.  Passive range of motion demonstrates no crepitus.  It does not exacerbate his pain or trigger pain.  He states that his shoulder aches and throbs constantly regardless of what he is doing.  He denies any pain in his left shoulder.  He is also complaining pain in his lower back.  This tends to be in the latissimus dorsi bilaterally.  It is located from around L4 down to his tailbone.  He denies any sciatica or numbness or weakness in his legs.  He denies any knee pain or ankle pain.  He denies any wrist pain or elbow pain.  Unfortunately recently his wife died from medical complications.  He is not sleeping well. At that time, my plan was: Differential diagnosis includes osteoarthritis in multiple joints, statin induced myopathy, autoimmune diseases such as PMR.  Begin by obtaining an x-ray of the cervical spine given the fact this is his worst pain.  I will also obtain a CBC, CMP, CK to evaluate for evidence of statin induced myopathy, and I will check a sedimentation rate to evaluate for possible underlying autoimmune diseases such as PMR.  I have asked the patient just continue pravastatin temporarily for 1 week to see if symptoms improve.  I will check a hemoglobin A1c to assess  management of his diabetes.  He continues to complain of diarrhea on metformin.  I have given the option of discontinuing metformin and replacing it with name brand medication such as invokana or jardiance.  He declines the change at the present time and elects to continue the metformin despite the diarrhea.  If sedimentation rate is extremely high, we may try the patient empirically on a trial of prednisone for possible PMR.  His sedimentation rate is normal, I believe the most likely explanation would be diffuse osteoarthritis unless his muscle pain improves after holding pravastatin for 1 week.  10/31/17 Patient feels much worse.  He has lost 5 pounds since his last visit.  He has no appetite.  He is losing weight.  There is now a painful mass in the right side of his neck.  It seems deep to the sternocleidomastoid muscle.  Is poorly defined.  There are not firm borders but there is a prominent fullness/lump in that area.  He reports dyspnea on exertion.  He reports diffuse myalgias and arthralgias.  Worse bone pain in his lower back around the level of L4 slightly to the left of midline.  He reports foul odor in his urine, he reports dark yellow urine sounding as though he may be dehydrated.  He reports fevers subjectively and chills.  He reports hot flashes.  All of these symptoms raise the concern about  possible underlying malignancy.  At that time, my plan was: I am concerned about possible underlying malignancy such as leukemia or lymphoma or multiple myeloma.  Have recommended a CT scan of the neck to evaluate the soft tissue swelling in the neck to rule out an abnormal lymph node.  Obtain urinalysis given the abnormal urine odor, obtain chest x-ray given the dyspnea on exertion, check CBC, TSH, CMP.  Await the results of the above studies and then proceed with further diagnostic work-up.  If there is significant proteinuria in the urinalysis, obtain an SPEP.  11/14/17 Office Visit on 12/16/2017    Component Date Value Ref Range Status  . POC HEMOGLOBIN 12/16/2017 10.3* 13.0 - 17.0 g/dL Final  . Iron 12/16/2017 23* 50 - 180 mcg/dL Final  . TIBC 12/16/2017 189* 250 - 425 mcg/dL (calc) Final  . %SAT 12/16/2017 12* 20 - 48 % (calc) Final  . Ferritin 12/16/2017 270  24 - 380 ng/mL Final  . WBC 12/16/2017 8.8  3.8 - 10.8 Thousand/uL Final  . RBC 12/16/2017 3.65* 4.20 - 5.80 Million/uL Final  . Hemoglobin 12/16/2017 10.2* 13.2 - 17.1 g/dL Final  . HCT 12/16/2017 31.9* 38.5 - 50.0 % Final  . MCV 12/16/2017 87.4  80.0 - 100.0 fL Final  . MCH 12/16/2017 27.9  27.0 - 33.0 pg Final  . MCHC 12/16/2017 32.0  32.0 - 36.0 g/dL Final  . RDW 12/16/2017 14.0  11.0 - 15.0 % Final  . Platelets 12/16/2017 306  140 - 400 Thousand/uL Final  . MPV 12/16/2017 10.0  7.5 - 12.5 fL Final  . Neutro Abs 12/16/2017 5,174  1,500 - 7,800 cells/uL Final  . Lymphs Abs 12/16/2017 1,874  850 - 3,900 cells/uL Final  . WBC mixed population 12/16/2017 1,030* 200 - 950 cells/uL Final  . Eosinophils Absolute 12/16/2017 642* 15 - 500 cells/uL Final  . Basophils Absolute 12/16/2017 79  0 - 200 cells/uL Final  . Neutrophils Relative % 12/16/2017 58.8  % Final  . Total Lymphocyte 12/16/2017 21.3  % Final  . Monocytes Relative 12/16/2017 11.7  % Final  . Eosinophils Relative 12/16/2017 7.3  % Final  . Basophils Relative 12/16/2017 0.9  % Final  . Glucose, Bld 12/16/2017 78  65 - 99 mg/dL Final   Comment: .            Fasting reference interval .   . BUN 12/16/2017 26* 7 - 25 mg/dL Final  . Creat 12/16/2017 1.26* 0.70 - 1.11 mg/dL Final   Comment: For patients >64 years of age, the reference limit for Creatinine is approximately 13% higher for people identified as African-American. .   . GFR, Est Non African American 12/16/2017 53* > OR = 60 mL/min/1.65m Final  . GFR, Est African American 12/16/2017 62  > OR = 60 mL/min/1.774mFinal  . BUN/Creatinine Ratio 12/16/2017 21  6 - 22 (calc) Final  . Sodium 12/16/2017  139  135 - 146 mmol/L Final  . Potassium 12/16/2017 5.1  3.5 - 5.3 mmol/L Final  . Chloride 12/16/2017 105  98 - 110 mmol/L Final  . CO2 12/16/2017 24  20 - 32 mmol/L Final  . Calcium 12/16/2017 9.0  8.6 - 10.3 mg/dL Final  . Total Protein 12/16/2017 7.1  6.1 - 8.1 g/dL Final  . Albumin 12/16/2017 3.3* 3.6 - 5.1 g/dL Final  . Globulin 12/16/2017 3.8* 1.9 - 3.7 g/dL (calc) Final  . AG Ratio 12/16/2017 0.9* 1.0 - 2.5 (calc) Final  . Total  Bilirubin 12/16/2017 0.3  0.2 - 1.2 mg/dL Final  . Alkaline phosphatase (APISO) 12/16/2017 52  40 - 115 U/L Final  . AST 12/16/2017 15  10 - 35 U/L Final  . ALT 12/16/2017 9  9 - 46 U/L Final  . Color, Urine 12/16/2017 YELLOW  YELLOW Final  . APPearance 12/16/2017 CLEAR  CLEAR Final  . Specific Gravity, Urine 12/16/2017 1.025  1.001 - 1.03 Final  . pH 12/16/2017 5.5  5.0 - 8.0 Final  . Glucose, UA 12/16/2017 NEGATIVE  NEGATIVE Final  . Bilirubin Urine 12/16/2017 NEGATIVE  NEGATIVE Final  . Ketones, ur 12/16/2017 NEGATIVE  NEGATIVE Final  . Hgb urine dipstick 12/16/2017 NEGATIVE  NEGATIVE Final  . Protein, ur 12/16/2017 1+* NEGATIVE Final  . Nitrite 12/16/2017 NEGATIVE  NEGATIVE Final  . Leukocytes, UA 12/16/2017 NEGATIVE  NEGATIVE Final  . WBC, UA 12/16/2017 0-5  0 - 5 /HPF Final  . RBC / HPF 12/16/2017 0-2  0 - 2 /HPF Final  . Squamous Epithelial / LPF 12/16/2017 NONE SEEN  < OR = 5 /HPF Final  . Bacteria, UA 12/16/2017 NONE SEEN  NONE SEEN /HPF Final  . Hyaline Cast 12/16/2017 NONE SEEN  NONE SEEN /LPF Final  . Note 12/16/2017    Final   Comment: This urine was analyzed for the presence of WBC,  RBC, bacteria, casts, and other formed elements.  Only those elements seen were reported. . .   Office Visit on 12/10/2017  Component Date Value Ref Range Status  . WBC 12/10/2017 16.5* 3.8 - 10.8 Thousand/uL Final  . RBC 12/10/2017 3.79* 4.20 - 5.80 Million/uL Final  . Hemoglobin 12/10/2017 10.8* 13.2 - 17.1 g/dL Final  . HCT 12/10/2017 31.8* 38.5 -  50.0 % Final  . MCV 12/10/2017 83.9  80.0 - 100.0 fL Final  . MCH 12/10/2017 28.5  27.0 - 33.0 pg Final  . MCHC 12/10/2017 34.0  32.0 - 36.0 g/dL Final  . RDW 12/10/2017 14.2  11.0 - 15.0 % Final  . Platelets 12/10/2017 321  140 - 400 Thousand/uL Final  . MPV 12/10/2017 10.2  7.5 - 12.5 fL Final  . Neutro Abs 12/10/2017 12,738* 1,500 - 7,800 cells/uL Final  . Lymphs Abs 12/10/2017 1,568  850 - 3,900 cells/uL Final  . WBC mixed population 12/10/2017 1,832* 200 - 950 cells/uL Final  . Eosinophils Absolute 12/10/2017 297  15 - 500 cells/uL Final  . Basophils Absolute 12/10/2017 66  0 - 200 cells/uL Final  . Neutrophils Relative % 12/10/2017 77.2  % Final  . Total Lymphocyte 12/10/2017 9.5  % Final  . Monocytes Relative 12/10/2017 11.1  % Final  . Eosinophils Relative 12/10/2017 1.8  % Final  . Basophils Relative 12/10/2017 0.4  % Final  . Glucose, Bld 12/10/2017 157* 65 - 99 mg/dL Final   Comment: .            Fasting reference interval . For someone without known diabetes, a glucose value >125 mg/dL indicates that they may have diabetes and this should be confirmed with a follow-up test. .   . BUN 12/10/2017 27* 7 - 25 mg/dL Final  . Creat 12/10/2017 1.31* 0.70 - 1.11 mg/dL Final   Comment: For patients >19 years of age, the reference limit for Creatinine is approximately 13% higher for people identified as African-American. .   . GFR, Est Non African American 12/10/2017 51* > OR = 60 mL/min/1.55m Final  . GFR, Est African American 12/10/2017 59* > OR =  60 mL/min/1.67m Final  . BUN/Creatinine Ratio 12/10/2017 21  6 - 22 (calc) Final  . Sodium 12/10/2017 137  135 - 146 mmol/L Final  . Potassium 12/10/2017 4.7  3.5 - 5.3 mmol/L Final  . Chloride 12/10/2017 103  98 - 110 mmol/L Final  . CO2 12/10/2017 27  20 - 32 mmol/L Final  . Calcium 12/10/2017 8.7  8.6 - 10.3 mg/dL Final  . Total Protein 12/10/2017 7.0  6.1 - 8.1 g/dL Final  . Albumin 12/10/2017 3.2* 3.6 - 5.1 g/dL Final    . Globulin 12/10/2017 3.8* 1.9 - 3.7 g/dL (calc) Final  . AG Ratio 12/10/2017 0.8* 1.0 - 2.5 (calc) Final  . Total Bilirubin 12/10/2017 0.5  0.2 - 1.2 mg/dL Final  . Alkaline phosphatase (APISO) 12/10/2017 51  40 - 115 U/L Final  . AST 12/10/2017 14  10 - 35 U/L Final  . ALT 12/10/2017 8* 9 - 46 U/L Final  . Sed Rate 12/10/2017 70* 0 - 20 mm/h Final  . Total Protein 12/10/2017 6.8  6.1 - 8.1 g/dL Final  . Albumin ELP 12/10/2017 3.0* 3.8 - 4.8 g/dL Final  . Alpha 1 12/10/2017 0.4* 0.2 - 0.3 g/dL Final  . Alpha 2 12/10/2017 0.9  0.5 - 0.9 g/dL Final  . Beta Globulin 12/10/2017 0.4  0.4 - 0.6 g/dL Final  . Beta 2 12/10/2017 0.5  0.2 - 0.5 g/dL Final  . Gamma Globulin 12/10/2017 1.6  0.8 - 1.7 g/dL Final  . SPE Interp. 12/10/2017    Final   Comment: . Evaluation is consistent with an acute inflammatory  pattern. .   . EXTRA LAVENDER-TOP TUBE 12/10/2017    Final   Comment: We received an extra specimen with no test requested. If any test is desired for this specimen please call client services and advise.    Labs showed hematuria prompting CT to evaluate for renal stones.  Result is below: IMPRESSION: 1. Pneumonia in the medial aspect of the posterior segment right lower lobe.  2. Renal calculi bilaterally, more on the left than on the right. No hydronephrosis or ureteral calculus on either side.  3. There is a degree of urinary bladder wall thickening, felt to represent cystitis.  4. Extensive sigmoid diverticulosis without diverticulitis. No bowel obstruction. No abscess. Appendix appears normal.  5. Aortoiliac and common femoral artery atherosclerosis. No aneurysm. There also coronary artery calcifications.  Thankfully no malignancy was seen in the chest or in the abdomen and pelvis.  Patient was started on Levaquin for pneumonia and is here today for follow-up.  It is very difficult to get a direct answer from the patient.  I asked him if he feels he is better, if his  body aches are better, if his fatigue is better.  He simply states I do not know doc.  His granddaughter who is with him states that he is doing much better.  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  She states that clinically he is doing better however he continues to suffer from severe fatigue.  The painful mass in the right side of his neck has resolved after antibiotics making me believe this was likely a reactive lymph node due to his occult pneumonia.  He continues to complain of pain in the right side of his neck along the distribution of the trapezius but this seems to be muscular pain exacerbated by underlying arthritis.  As far as  the diffuse body aches all over, this is resolved.  I asked the patient how he is doing after the death of his wife and he quietly begins to cry.  His granddaughter states that he is very depressed.  They were married 32 years.  He seems lost without her according to his granddaughter.  She believes this may be a large part of what is causing his various somatic complaints.  At that time, my plan was: Neck mass has resolved after antibiotics.  I believe this was likely a reactive lymph node to his right sided pneumonia.  His dyspnea is better making me believe that this was related to his pneumonia.  His diffuse myalgias have improved aside from his neck and I believe this could have been due to his pneumonia.  I believe his severe fatigue could be underlying depression.  I have recommended trying Paxil 20 mg a day and then rechecking in 4 to 5 weeks.  I have also recommended grief counseling through hospice and I will try to schedule this for him.  12/10/17 Subjectively, the patient has decompensated since I last saw him.  He returned later and saw my partner for worsening cough, worsening shortness of breath.  They tried an additional round of antibiotics with no significant improvement.  Today he states that he feels terrible.   He is crying when I enter the room.  He states that he hurts as soon as he wakes up in the morning.  The location of the pain varies.  He can be in his lower back towards the left-hand side.  It also hurts over his left lower ribs to gentle palpation.  He also complains of chronic neck pain.  The reactive lymphadenopathy has returned to the right side of his neck.  He reports chills night sweats subjective fevers, worsening shortness of breath, constant cough, dyspnea on exertion.  He reports abdominal discomfort in the left lower quadrant.  He reports poor appetite.  He reports fatigue.  At that time, my plan was: Patient clinically looks terrible.  I am sending him immediately for a chest x-ray to evaluate for recurrent pneumonia versus a possible pulmonary mass.  I am ordered an x-ray of his lower back as well as of his ribs given the diffuse bone pain that he is having.  I will also be obtaining an SPEP to screen for multiple myeloma.  I will obtain an ultrasound of the neck to evaluate for the reactive lymphadenopathy present on the right-hand side concerning for possible neoplasm.  Meanwhile check a CBC as well as a CMP and a sed rate.  I will treat the patient empirically for COPD exacerbation and cover for atypical infections with a Z-Pak, prednisone taper pack, and Combivent 1 inhalation every 6 hours.  Await the results of this extensive work-up above.  Addendum 6/20- No lytic lesions are seen in the back but their is another pneumonia.  Begin z pack, add amoxicillin 1 g tid for 10 days, take prednisone and combivent as prescribed.  If worsening, needs to go to hospital for IV abx.  Otherwise, recheck here Monday with Dr. Buelah Manis.  ESR and WBC elevated but again pneumonia seen on CXR.  If not improving, will get dedicated CT of chest to evaluate for malignancy.  Awaiting SPEP still given diffuse bone pain as well as [place] of neck to evaluate for atypical cervical lymph node.   12/23/17 Ultimately SPEP  showed no M spike or evidence of multiple myeloma.  There were no lytic lesions seen on the lumbar spine x-ray.  His white count improved after antibiotics and steroids for his RLL pneumonia.  There has been a decline in his hemoglobin.  He was 12.7, 4 weeks ago.  It was down to 10.8 on the 20th.   We have scheduled the patient for CT scan of the chest todayThe ultrasound of his neck revealed no cervical lymphadenopathy or no worrisome pathologic lymph nodes to explain the pain on the right side of his neck.  I believe he was experiencing a reactive lymphadenopathy which is since resolved.    He is afebrile and his oxygen saturations have improved.  However subjectively, the patient feels no better.  Patient states that he feels weak and tired.  He reports easy fatigability.  He reports dyspnea but however this seems to be at his chronic baseline now.  He continues to complain of neck pain.  He also complains of low back pain that radiates into his left groin now.  He states that the pain today is a 1 on a scale of 1-10 but at times it can be severe.  It tends to come and go with no exacerbating or alleviating factors.  He frequently tears up as he discusses the pain in his neck and back.  He states that he has been hurting primarily in his neck and back but really all over ever since his wife passed away and his sugars went out of control earlier this year.  I am struck by the fact that he cries occasionally as he discusses his back pain.  This seems out of proportion to the level of pain that he is describing and I believe indicates an underlying depression or sadness that could be contributing to his symptomatology  Past Medical History:  Diagnosis Date  . Acute venous embolism and thrombosis of unspecified deep vessels of lower extremity   . Atrial flutter (HCC)    and SVT that is post ablation  . CAD (coronary artery disease)   . Chronic kidney disease    stones  . Dyslipidemia   . GERD  (gastroesophageal reflux disease)   . HTN (hypertension)   . Shortness of breath    on exertion   Past Surgical History:  Procedure Laterality Date  . CARDIAC CATHETERIZATION     s/p cardiac catheterization and bypass surgery as well as EGD with dilation x3, left lower extremity vein stripping  . CORONARY ARTERY BYPASS GRAFT    . ESOPHAGOGASTRODUODENOSCOPY N/A 12/06/2013   Procedure: ESOPHAGOGASTRODUODENOSCOPY (EGD);  Surgeon: Lear Ng, MD;  Location: [facility];  Service: Endoscopy;  Laterality: N/A;  . left knee anthroscopy    . SECONDARY CLOSURE ARM     Current Outpatient Medications on File Prior to Visit  Medication Sig Dispense Refill  . albuterol (PROVENTIL) (2.5 MG/3ML) 0.083% nebulizer solution Take 3 mLs (2.5 mg total) by nebulization every 6 (six) hours as needed for wheezing or shortness of breath. 75 mL 12  . amoxicillin (AMOXIL) 500 MG capsule Take 2 capsules (1,000 mg total) by mouth 3 (three) times daily. 60 capsule 0  . aspirin 325 MG tablet Take 325 mg by mouth daily.     . BD INSULIN SYRINGE U/F 31G X 5/16" 0.3 ML MISC USE WITH NOVOLIN TWICE DAY WITH A MEAL 100 each 5  . clotrimazole (MYCELEX) 10 MG troche Take 1 tablet (10 mg total) by mouth 5 (five) times daily. 70 tablet 1  . ferrous sulfate  325 (65 FE) MG tablet Take 1 tablet (325 mg total) by mouth daily with breakfast.  3  . glucose blood (ONE TOUCH ULTRA TEST) test strip Check BS tid 100 each 12  . HYDROcodone-acetaminophen (NORCO) 5-325 MG tablet Take 1 tablet by mouth every 6 (six) hours as needed for moderate pain. 30 tablet 0  . insulin NPH-regular Human (NOVOLIN 70/30) (70-30) 100 UNIT/ML injection Inject 8 Units into the skin 2 (two) times daily with a meal. 10 mL 3  . Ipratropium-Albuterol (COMBIVENT RESPIMAT) 20-100 MCG/ACT AERS respimat Inhale 1 puff into the lungs every 6 (six) hours. 1 Inhaler 1  . Lancets Misc. MISC Use to check BS TID DX:E11.9 100 each 5  . meloxicam (MOBIC) 7.5 MG tablet  Take 1 tablet (7.5 mg total) by mouth daily. 30 tablet 2  . metoprolol succinate (TOPROL-XL) 25 MG 24 hr tablet Take 1 tablet (25 mg total) by mouth daily. 30 tablet 3  . nitroGLYCERIN (NITROSTAT) 0.4 MG SL tablet Place 0.4 mg under the tongue every 5 (five) minutes as needed for chest pain.    . pantoprazole (PROTONIX) 40 MG tablet Take 1 tablet (40 mg total) by mouth daily. 90 tablet 3  . PARoxetine (PAXIL) 20 MG tablet TAKE 1 TABLET BY MOUTH EVERY DAY 90 tablet 2  . pravastatin (PRAVACHOL) 40 MG tablet TAKE 1 TABLET BY MOUTH EVERY DAY 90 tablet 2  . tamsulosin (FLOMAX) 0.4 MG CAPS capsule TAKE 1 CAPSULE BY MOUTH DAILY AFTER SUPPER. 30 capsule 0   No current facility-administered medications on file prior to visit.    Allergies  Allergen Reactions  . Celebrex [Celecoxib] Rash    blisters   Social History   Socioeconomic History  . Marital status: Married    Spouse name: Not on file  . Number of children: Not on file  . Years of education: Not on file  . Highest education level: Not on file  Occupational History  . Not on file  Social Needs  . Financial resource strain: Not on file  . Food insecurity:    Worry: Not on file    Inability: Not on file  . Transportation needs:    Medical: Not on file    Non-medical: Not on file  Tobacco Use  . Smoking status: Current Every Day Smoker    Packs/day: 1.00    Types: Cigarettes  . Smokeless tobacco: Never Used  . Tobacco comment: greater than 50 pack year history  Substance and Sexual Activity  . Alcohol use: No  . Drug use: No  . Sexual activity: Not on file  Lifestyle  . Physical activity:    Days per week: Not on file    Minutes per session: Not on file  . Stress: Not on file  Relationships  . Social connections:    Talks on phone: Not on file    Gets together: Not on file    Attends religious service: Not on file    Active member of club or organization: Not on file    Attends meetings of clubs or organizations: Not  on file    Relationship status: Not on file  . Intimate partner violence:    Fear of current or ex partner: Not on file    Emotionally abused: Not on file    Physically abused: Not on file    Forced sexual activity: Not on file  Other Topics Concern  . 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       Review of Systems  All other systems reviewed and are negative.      Objective:   Physical Exam  Constitutional: He is oriented to person, place, and time. He appears well-developed and well-nourished. No distress.  Neck: Normal range of motion. Neck supple. No JVD present. Spinous process tenderness and muscular tenderness present. No tracheal deviation present.  Cardiovascular: Normal rate, regular rhythm and normal heart sounds. Exam reveals no gallop and no friction rub.  No murmur heard. Pulmonary/Chest: Effort normal. No respiratory distress. He has no wheezes. He has no rales. He exhibits no tenderness.  Abdominal: Soft. Bowel sounds are normal. He exhibits no distension. There is no tenderness. There is no rebound.  Musculoskeletal:       Cervical back: He exhibits decreased range of motion, tenderness, bony tenderness and pain.       Lumbar back: He exhibits pain. He exhibits no tenderness, no bony tenderness, no swelling, no edema, no deformity and no spasm.       Back:  Lymphadenopathy:    He has no cervical adenopathy.  Neurological: He is alert and oriented to person, place, and time. He exhibits normal muscle tone. Coordination normal.  Skin: No rash noted. He is not diaphoretic. No erythema.  Vitals reviewed.         Assessment & Plan:  Anemia, unspecified type  Chronic neck pain  Pneumonia of right lower lobe due to infectious organism [facility])  Chronic fatigue  Acute left-sided low back pain without sciatica  Patient  states I just do not feel good.  However it is difficult to pin him down on his exact symptoms.  As best I can ascertain it is chronic dyspnea, chronic neck pain, chronic low back pain, easy fatigability, and I believe underlying depression and anhedonia.  Patient has a CT scan today given his persistent right lower lobe pneumonia to rule out an underlying malignancy.  If the CT scan shows no underlying pulmonary malignancy and resolution of his pneumonia, I would focus on chronic management of his COPD but starting the patient on Trelegy.  He can still use his nebulizer as needed for shortness of breath.  I will continue the patient on iron sulfate 325 mg p.o. twice daily and recheck a CBC in 1 month to monitor for improvement of his anemia.  I am still waiting the results of his stool test to determine if there is evidence of GI blood loss.  Obviously if there is evidence of an ongoing GI blood loss, I will consult gastroenterology.  Regarding his depression, I would recommend switching his antidepressant from Paxil to Lexapro 10 mg a day to see if this will improve the patient's underlying anhedonia and depression.  Regarding his neck pain in his low back pain, he is using Tylenol as needed.  I will be glad to supplement with narcotics as needed for breakthrough pain.  I also offer the patient a referral to orthopedics to discuss possible facet joint injections in the neck and in the lower back for his arthritic bone pain.  Patient will consider this but at the present time declines.  I truly believe that if the CT scan rules out an underlying malignancy that the patient's symptoms are multifactorial and likely are due to a combination of recurrent community-acquired pneumonia, underlying COPD, mild anemia secondary to  iron deficiency, and depression and grief over the loss of his wife of more than 60 years coupled with degenerative disc disease in his neck and his lower back.  Bit more than 45 minutes today with  the patient explaining all this in detail.  Unfortunately I feel that he is frustrated because he does not feel better and he has been extremely patient with me in trying to help him

## 2017-12-24 ENCOUNTER — Other Ambulatory Visit: Payer: Self-pay | Admitting: Family Medicine

## 2017-12-24 DIAGNOSIS — J9809 Other diseases of bronchus, not elsewhere classified: Secondary | ICD-10-CM

## 2017-12-24 DIAGNOSIS — T17500A Unspecified foreign body in bronchus causing asphyxiation, initial encounter: Secondary | ICD-10-CM

## 2017-12-29 LAB — FECAL GLOBIN BY IMMUNOCHEMISTRY
FECAL GLOBIN RESULT: NOT DETECTED
MICRO NUMBER: 90796016
SPECIMEN QUALITY:: ADEQUATE

## 2017-12-30 ENCOUNTER — Other Ambulatory Visit: Payer: Self-pay | Admitting: Family Medicine

## 2017-12-31 ENCOUNTER — Other Ambulatory Visit: Payer: Self-pay | Admitting: Family Medicine

## 2017-12-31 MED ORDER — INSULIN NPH ISOPHANE & REGULAR (70-30) 100 UNIT/ML ~~LOC~~ SUSP: 8.0000 [IU] | Freq: Two times a day (BID) | SUBCUTANEOUS | 3 refills | Status: DC

## 2018-01-01 ENCOUNTER — Other Ambulatory Visit: Payer: Medicare HMO

## 2018-01-06 ENCOUNTER — Telehealth: Payer: Self-pay | Admitting: Oncology

## 2018-01-06 NOTE — Telephone Encounter (Signed)
[name] called in to cancel

## 2018-01-07 ENCOUNTER — Inpatient Hospital Stay: Payer: Medicare HMO | Admitting: Oncology

## 2018-01-08 ENCOUNTER — Encounter: Payer: Self-pay | Admitting: Internal Medicine

## 2018-01-08 ENCOUNTER — Ambulatory Visit (INDEPENDENT_AMBULATORY_CARE_PROVIDER_SITE_OTHER)
Admission: RE | Admit: 2018-01-08 | Discharge: 2018-01-08 | Disposition: A | Discharge status: Home / Self Care | Payer: Medicare HMO | Source: Ambulatory Visit | Attending: Internal Medicine | Admitting: Internal Medicine

## 2018-01-08 ENCOUNTER — Ambulatory Visit: Payer: Medicare HMO | Admitting: Internal Medicine

## 2018-01-08 ENCOUNTER — Other Ambulatory Visit: Payer: Self-pay | Admitting: Family Medicine

## 2018-01-08 VITALS — BP 124/66 | HR 82 | Ht 74.0 in | Wt 138.0 lb

## 2018-01-08 DIAGNOSIS — J189 Pneumonia, unspecified organism: Secondary | ICD-10-CM | POA: Diagnosis not present

## 2018-01-08 DIAGNOSIS — F17210 Nicotine dependence, cigarettes, uncomplicated: Secondary | ICD-10-CM

## 2018-01-08 DIAGNOSIS — R69 Illness, unspecified: Secondary | ICD-10-CM | POA: Diagnosis not present

## 2018-01-08 DIAGNOSIS — R918 Other nonspecific abnormal finding of lung field: Secondary | ICD-10-CM | POA: Diagnosis not present

## 2018-01-08 MED ORDER — HYDROCODONE-ACETAMINOPHEN 5-325 MG PO TABS: 1.0000 | ORAL_TABLET | Freq: Four times a day (QID) | ORAL | 0 refills | Status: DC | PRN

## 2018-01-08 NOTE — Telephone Encounter (Signed)
Med refill on hydrocodone to [address]

## 2018-01-08 NOTE — Telephone Encounter (Signed)
Patient is requesting a refill on Hydrocodone   LOV: 12/23/17  LRF:   12/10/17

## 2018-01-08 NOTE — Progress Notes (Signed)
[name], male    DOB: June 02, 1935, 83 y.o.   MRN: [id number]    Brief patient profile:  89 yowm acitve smoker with new onset sob x 2014 worse since April 2019  Assoc with chronic dysphagia with documented mild silent aspiration by DgEs 08/29/17 and not able to swallow barium tablet.  Referred 01/08/2018 by DR Dennard Schaumann for eval infiltrates on cxr RLL ant basal segment first noted 12/11/17 with suggestion of mucus plugging by CT chest 12/24/17    History of Present Illness  01/08/2018 1st pulmonary office eval/ [facility] Chief Complaint  Patient presents with  . Pulmonary Consult    Referred by Dr. Dennard Schaumann. Pt states he has had recurrent PNA since April 2019. He states he always feels SOB with or without exertion. He is using albuterol nebs 2 x daily.    Dyspnea: uphill when walks back from mailbox= MMRC1 = can walk nl pace, flat grade, can't hurry or go uphills or steps s sob    Cough: rattling / worse since April  2019 some better with abx transiently each time  / min mucoid sputum esp in am  Sleep: sleeps prone SABA use: not sure they help   Chronic dysphagia, lost about 10 lb since onset of coughing April 2019  Already eval by ST and Gdaughter says he's been following instructions but not helping swallowing   No obvious day to day or daytime variability or assoc excess/ purulent sputum or mucus plugs or hemoptysis or cp or chest tightness, subjective wheeze or overt sinus or hb symptoms.   Sleeping prone  without nocturnal  or early am exacerbation  of respiratory  c/o's or need for noct saba. Also denies any obvious fluctuation of symptoms with weather or environmental changes or other aggravating or alleviating factors except as outlined above   No unusual exposure hx or h/o childhood pna/ asthma or knowledge of premature birth.  Current Allergies, Complete Past Medical History, Past Surgical History, Family History, and Social History were reviewed in [employer]  record.  ROS  The following are not active complaints unless bolded Hoarseness, sore throat, dysphagia, dental problems, itching, sneezing,  nasal congestion or discharge of excess mucus or purulent secretions, ear ache,   fever, chills, sweats, unintended wt loss or wt gain, classically pleuritic or exertional cp,  orthopnea pnd or arm/hand swelling  or leg swelling, presyncope, palpitations, abdominal pain, anorexia, nausea, vomiting, diarrhea  or change in bowel habits or change in bladder habits, change in stools or change in urine, dysuria, hematuria,  rash, arthralgias, visual complaints, headache, numbness, weakness or ataxia or problems with walking or coordination,  change in mood or  memory.             Past Medical History:  Diagnosis Date  . Acute venous embolism and thrombosis of unspecified deep vessels of lower extremity   . Atrial flutter (HCC)    and SVT that is post ablation  . CAD (coronary artery disease)   . Chronic kidney disease    stones  . Dyslipidemia   . GERD (gastroesophageal reflux disease)   . HTN (hypertension)   . Shortness of breath    on exertion   Not able to verify he  takes these meds Outpatient Medications Prior to Visit  Medication Sig Dispense Refill  . albuterol (PROVENTIL) (2.5 MG/3ML) 0.083% nebulizer solution Take 3 mLs (2.5 mg total) by nebulization every 6 (six) hours as needed for wheezing or shortness of  breath. 75 mL 12  . aspirin 325 MG tablet Take 325 mg by mouth daily.     . BD INSULIN SYRINGE U/F 31G X 5/16" 0.3 ML MISC USE WITH NOVOLIN TWICE DAY WITH A MEAL 100 each 5  . COMBIVENT RESPIMAT 20-100 MCG/ACT AERS respimat INHALE 1 PUFF INTO THE LUNGS EVERY 6 (SIX) HOURS. 1 Inhaler 11  . ferrous sulfate 325 (65 FE) MG tablet Take 1 tablet (325 mg total) by mouth daily with breakfast.  3  . glucose blood (ONE TOUCH ULTRA TEST) test strip Check BS tid 100 each 12  . HYDROcodone-acetaminophen (NORCO) 5-325 MG tablet Take 1 tablet by mouth  every 6 (six) hours as needed for moderate pain. 30 tablet 0  . insulin NPH-regular Human (NOVOLIN 70/30) (70-30) 100 UNIT/ML injection Inject 8 Units into the skin 2 (two) times daily with a meal. 10 mL 3  . Lancets Misc. MISC Use to check BS TID DX:E11.9 100 each 5  . metoprolol succinate (TOPROL-XL) 25 MG 24 hr tablet Take 1 tablet (25 mg total) by mouth daily. (Patient taking differently: Take 12.5 mg by mouth daily. ) 30 tablet 3  . nitroGLYCERIN (NITROSTAT) 0.4 MG SL tablet Place 0.4 mg under the tongue every 5 (five) minutes as needed for chest pain.    . pantoprazole (PROTONIX) 40 MG tablet Take 1 tablet (40 mg total) by mouth daily. 90 tablet 3  . PARoxetine (PAXIL) 20 MG tablet TAKE 1 TABLET BY MOUTH EVERY DAY 90 tablet 2  . tamsulosin (FLOMAX) 0.4 MG CAPS capsule TAKE 1 CAPSULE BY MOUTH DAILY AFTER SUPPER. 30 capsule 0  . meloxicam (MOBIC) 7.5 MG tablet Take 1 tablet (7.5 mg total) by mouth daily. 30 tablet 2  . clotrimazole (MYCELEX) 10 MG troche Take 1 tablet (10 mg total) by mouth 5 (five) times daily. (Patient not taking: Reported on 01/08/2018) 70 tablet 1  . pravastatin (PRAVACHOL) 40 MG tablet TAKE 1 TABLET BY MOUTH EVERY DAY (Patient not taking: Reported on 01/08/2018) 90 tablet 2   No facility-administered medications prior to visit.              Objective:     BP 124/66 (BP Location: Left Arm, Cuff Size: Normal)   Pulse 82   Ht 6\' 2"  (1.88 m)   Wt 138 lb (62.6 kg)   SpO2 98%   BMI 17.72 kg/m   SpO2: 98 %  RA  Wt Readings from Last 3 Encounters:  01/08/18 138 lb (62.6 kg)  12/23/17 144 lb (65.3 kg)  12/16/17 142 lb (64.4 kg)       Stoic wm very hard of hearing     HEENT: nl  turbinates bilaterally, and oropharynx. Nl external ear canals without cough reflex - a few bottom teeth only    NECK :  without JVD/Nodes/TM/ nl carotid upstrokes bilaterally   LUNGS: no acc muscle use,  slt barrel contour, scattered insp and exp rhonchi none localized to RLL       CV:  RRR  no s3 or murmur or increase in P2, and no edema   ABD:  soft and nontender with nl inspiratory excursion in the supine position. No bruits or organomegaly appreciated, bowel sounds nl  MS:  Nl gait/ ext warm without deformities, calf tenderness, cyanosis or clubbing No obvious joint restrictions   SKIN: warm and dry without lesions    NEURO:  alert, approp, nl sensorium with  no motor or cerebellar deficits apparent.  I personally reviewed images and agree with radiology impression as follows:   Chest CT w/o contrast 12/23/17  Lungs/Pleura: The lungs are well aerated bilaterally and demonstrate biapical pleural and parenchymal scarring. Scattered parenchymal nodules are noted and stable from 2016 consistent with a benign etiology. No new sizable parenchymal nodules are seen. Diffuse emphysematous changes are identified bilaterally. In the right lower lobe there is again identified focal infiltrate consistent with acute pneumonia. This is similar to that seen on the recent chest x-ray from 12/11/2017. Considerable mucous plugging is noted centrally in the right infrahilar region related to the infiltrate.     CXR PA and Lateral:   01/08/2018 :    I personally reviewed images and agree with radiology impression as follows:   No change mostly R anterobasilar densities        Assessment   No problem-specific Assessment & Plan notes found for this encounter.     Christinia Gully, MD 01/08/2018

## 2018-01-08 NOTE — Patient Instructions (Addendum)
The key is to stop smoking completely before smoking completely stops you!    Best cough medication mucinex 1200 mg every 12 hours as needed    Please remember to go to the  x-ray department downstairs in the basement  for your tests - we will call you with the results when they are available and arrange appropriate follow up

## 2018-01-09 ENCOUNTER — Encounter: Payer: Self-pay | Admitting: Internal Medicine

## 2018-01-09 NOTE — Assessment & Plan Note (Signed)
Onset spring 2019 assoc with documented aspiration on DgEs 08/2017 in pt with poor cough mechanics  - cxr changes 12/11/17 no better 01/08/2018 with "mucus plugs" by CT chest 12/23/17 in active smoker - REC fob    this pattern is worrisome mostly for FB/ retained food and the anterobasilar distribution would be unusual for aspiration x that he usually sleeps prone  - malignancy is in the ddx since he's an active smoker (see separate a/p)   Discussed in detail all the  indications, usual  risks and alternatives  relative to the benefits with patient who agrees to proceed with bronchoscopy with tbbx of ant segment on R if not obvious airway findings    Total time devoted to counseling  > 50 % of initial 60 min office visit:  review case with pt/daughter Caren Griffins  discussion of options/alternatives/ personally creating written customized instructions  in presence of pt  then going over those specific  Instructions directly with the pt including how to use all of the meds but in particular covering each new medication in detail and the difference between the maintenance= "automatic" meds and the prns using an action plan format for the latter (If this problem/symptom => do that organization reading Left to right).  Please see AVS from this visit for a full list of these instructions which I personally wrote for this pt and  are unique to this visit.

## 2018-01-09 NOTE — Assessment & Plan Note (Signed)
4-5 min discussion re active cigarette smoking in addition to office E&M  Ask about tobacco use:   ongoing Advise quitting   Advised of immediate concerns with mucociliary function in setting of likely ongoing low grade aspiration being an obvious and reversible problem if he quits now  Assess willingness:  Not committed at this point Assist in quit attempt:  Per PCP when ready Arrange follow up:   Follow up per Primary Care planned

## 2018-01-14 ENCOUNTER — Encounter (HOSPITAL_COMMUNITY)
Admission: RE | Disposition: A | Discharge status: Home / Self Care | Payer: Self-pay | Source: Ambulatory Visit | Attending: Internal Medicine

## 2018-01-14 ENCOUNTER — Ambulatory Visit (HOSPITAL_COMMUNITY): Payer: Medicare HMO

## 2018-01-14 ENCOUNTER — Encounter (HOSPITAL_COMMUNITY): Payer: Self-pay | Admitting: Respiratory Therapy

## 2018-01-14 ENCOUNTER — Ambulatory Visit (HOSPITAL_COMMUNITY)
Admission: RE | Admit: 2018-01-14 | Discharge: 2018-01-14 | Disposition: A | Discharge status: Home / Self Care | Payer: Medicare HMO | Source: Ambulatory Visit | Attending: Internal Medicine | Admitting: Internal Medicine

## 2018-01-14 DIAGNOSIS — Z9889 Other specified postprocedural states: Secondary | ICD-10-CM

## 2018-01-14 DIAGNOSIS — R918 Other nonspecific abnormal finding of lung field: Secondary | ICD-10-CM | POA: Diagnosis not present

## 2018-01-14 DIAGNOSIS — J189 Pneumonia, unspecified organism: Secondary | ICD-10-CM | POA: Diagnosis not present

## 2018-01-14 DIAGNOSIS — I2510 Atherosclerotic heart disease of native coronary artery without angina pectoris: Secondary | ICD-10-CM | POA: Diagnosis not present

## 2018-01-14 DIAGNOSIS — Z79899 Other long term (current) drug therapy: Secondary | ICD-10-CM | POA: Insufficient documentation

## 2018-01-14 DIAGNOSIS — Z86718 Personal history of other venous thrombosis and embolism: Secondary | ICD-10-CM | POA: Insufficient documentation

## 2018-01-14 DIAGNOSIS — J180 Bronchopneumonia, unspecified organism: Secondary | ICD-10-CM | POA: Diagnosis not present

## 2018-01-14 DIAGNOSIS — K219 Gastro-esophageal reflux disease without esophagitis: Secondary | ICD-10-CM | POA: Insufficient documentation

## 2018-01-14 DIAGNOSIS — Z791 Long term (current) use of non-steroidal anti-inflammatories (NSAID): Secondary | ICD-10-CM | POA: Diagnosis not present

## 2018-01-14 DIAGNOSIS — Z794 Long term (current) use of insulin: Secondary | ICD-10-CM | POA: Insufficient documentation

## 2018-01-14 DIAGNOSIS — I10 Essential (primary) hypertension: Secondary | ICD-10-CM | POA: Insufficient documentation

## 2018-01-14 DIAGNOSIS — Z7982 Long term (current) use of aspirin: Secondary | ICD-10-CM | POA: Diagnosis not present

## 2018-01-14 HISTORY — PX: VIDEO BRONCHOSCOPY: SHX5072

## 2018-01-14 LAB — GLUCOSE, CAPILLARY
GLUCOSE-CAPILLARY: 111 mg/dL — AB (ref 70–99)
Glucose-Capillary: 120 mg/dL — ABNORMAL HIGH (ref 70–99)

## 2018-01-14 SURGERY — VIDEO BRONCHOSCOPY WITHOUT FLUORO
Anesthesia: Moderate Sedation | Laterality: Bilateral

## 2018-01-14 MED ORDER — LIDOCAINE HCL URETHRAL/MUCOSAL 2 % EX GEL
CUTANEOUS | Status: DC | PRN
  Administered 2018-01-14: 1

## 2018-01-14 MED ORDER — MEPERIDINE HCL 100 MG/ML IJ SOLN
INTRAMUSCULAR | Status: AC
  Filled 2018-01-14: qty 2

## 2018-01-14 MED ORDER — LIDOCAINE-EPINEPHRINE (PF) 1 %-1:200000 IJ SOLN
INTRAMUSCULAR | Status: DC | PRN
  Administered 2018-01-14: 6 mL

## 2018-01-14 MED ORDER — MEPERIDINE HCL 25 MG/ML IJ SOLN
INTRAMUSCULAR | Status: DC | PRN
  Administered 2018-01-14: 25 mg via INTRAVENOUS

## 2018-01-14 MED ORDER — MEPERIDINE HCL 100 MG/ML IJ SOLN: 100.0000 mg | Freq: Once | INTRAMUSCULAR | Status: DC

## 2018-01-14 MED ORDER — MIDAZOLAM HCL 5 MG/ML IJ SOLN: 1.0000 mg | Freq: Once | INTRAMUSCULAR | Status: DC

## 2018-01-14 MED ORDER — LIDOCAINE HCL 2 % EX GEL
1.0000 "application " | Freq: Once | CUTANEOUS | Status: DC
  Filled 2018-01-14: qty 5

## 2018-01-14 MED ORDER — PHENYLEPHRINE HCL 0.25 % NA SOLN
NASAL | Status: DC | PRN
  Administered 2018-01-14: 2 via NASAL

## 2018-01-14 MED ORDER — MIDAZOLAM HCL 5 MG/ML IJ SOLN
INTRAMUSCULAR | Status: AC
  Filled 2018-01-14: qty 2

## 2018-01-14 MED ORDER — PHENYLEPHRINE HCL 0.25 % NA SOLN: 1.0000 | Freq: Four times a day (QID) | NASAL | Status: DC | PRN

## 2018-01-14 MED ORDER — SODIUM CHLORIDE 0.9 % IV SOLN
INTRAVENOUS | Status: DC
  Administered 2018-01-14: 09:00:00 via INTRAVENOUS

## 2018-01-14 MED ORDER — MIDAZOLAM HCL 10 MG/2ML IJ SOLN
INTRAMUSCULAR | Status: DC | PRN
  Administered 2018-01-14: 2.5 mg via INTRAVENOUS

## 2018-01-14 NOTE — Op Note (Signed)
Bronchoscopy Procedure Note  Date of Operation: 01/14/2018   Pre-op Diagnosis: pulmonary infiltrates   Post-op Diagnosis: same  Surgeon: Christinia Gully  Anesthesia: Monitored Local Anesthesia with Sedation Time Started: 0914  Total of versed 2.5 mg IV and demerol 25 mg IV Time Stopped:  0930    Operation: Video Flexible fiberoptic bronchoscopy, diagnostic   Findings: copious mucoid secretions bilaterally but underlying mucosa/airways widely patent  Specimen: bal RLL/ tbb rll  Estimated Blood Loss: minimal  Complications: none  Indications and History: See updated H and P same date. The risks, benefits, complications, treatment options and expected outcomes were discussed with the patient.  The possibilities of reaction to medication, pulmonary aspiration, perforation of a viscus, bleeding, failure to diagnose a condition and creating a complication requiring transfusion or operation were discussed with the patient who freely signed the consent.    Description of Procedure: The patient was re-examined in the bronchoscopy suite and the site of surgery properly noted/marked.  The patient was identified  and the procedure verified as Flexible Fiberoptic Bronchoscopy.  A Time Out was held and the above information confirmed.   After the induction of topical nasopharyngeal anesthesia, the patient was positioned  and the bronchoscope was passed through the R naris. The vocal cords were visualized and  1% buffered lidocaine 5 ml was topically placed onto the cords. The cords were nl. The scope was then passed into the trachea.  1% buffered lidocaine given topically. Airways inspected bilaterally to the subsegmental level with the following findings:  Copious mucoid secretions bilaterally but underlying mucosa/airways widely patent   Interventions:  BAL RLL Tbbx RLL ant segment x 2 good yield        The Patient was taken to the Endoscopy Recovery area in satisfactory  condition.  Attestation: I performed the procedure.  Christinia Gully, MD Pulmonary and [place] [phone] After 5:30 PM or weekends, call [phone]

## 2018-01-14 NOTE — H&P (Signed)
[name], male    DOB: 11-18-1934, 83 y.o.   MRN: [id number]    Brief patient profile:  3 yowm acitve smoker with new onset sob x 2014 worse since April 2019  Assoc with chronic dysphagia with documented mild silent aspiration by DgEs 08/29/17 and not able to swallow barium tablet.  Referred 01/08/2018 by DR Dennard Schaumann for eval infiltrates on cxr RLL ant basal segment first noted 12/11/17 with suggestion of mucus plugging by CT chest 12/24/17    History of Present Illness  01/08/2018 1st pulmonary office eval/ [name]     Chief Complaint  Patient presents with  . Pulmonary Consult    Referred by Dr. Dennard Schaumann. Pt states he has had recurrent PNA since April 2019. He states he always feels SOB with or without exertion. He is using albuterol nebs 2 x daily.    Dyspnea: uphill when walks back from mailbox= MMRC1 = can walk nl pace, flat grade, can't hurry or go uphills or steps s sob    Cough: rattling / worse since April  2019 some better with abx transiently each time  / min mucoid sputum esp in am  Sleep: sleeps prone SABA use: not sure they help   Chronic dysphagia, lost about 10 lb since onset of coughing April 2019  Already eval by ST and Gdaughter says he's been following instructions but not helping swallowing   No obvious day to day or daytime variability or assoc excess/ purulent sputum or mucus plugs or hemoptysis or cp or chest tightness, subjective wheeze or overt sinus or hb symptoms.   Sleeping prone  without nocturnal  or early am exacerbation  of respiratory  c/o's or need for noct saba. Also denies any obvious fluctuation of symptoms with weather or environmental changes or other aggravating or alleviating factors except as outlined above   No unusual exposure hx or h/o childhood pna/ asthma or knowledge of premature birth.  Current Allergies, Complete Past Medical History, Past Surgical History, Family History, and Social History were reviewed in [employer] record.  ROS  The following are not active complaints unless bolded Hoarseness, sore throat, dysphagia, dental problems, itching, sneezing,  nasal congestion or discharge of excess mucus or purulent secretions, ear ache,   fever, chills, sweats, unintended wt loss or wt gain, classically pleuritic or exertional cp,  orthopnea pnd or arm/hand swelling  or leg swelling, presyncope, palpitations, abdominal pain, anorexia, nausea, vomiting, diarrhea  or change in bowel habits or change in bladder habits, change in stools or change in urine, dysuria, hematuria,  rash, arthralgias, visual complaints, headache, numbness, weakness or ataxia or problems with walking or coordination,  change in mood or  memory.                 Past Medical History:  Diagnosis Date  . Acute venous embolism and thrombosis of unspecified deep vessels of lower extremity   . Atrial flutter (HCC)    and SVT that is post ablation  . CAD (coronary artery disease)   . Chronic kidney disease    stones  . Dyslipidemia   . GERD (gastroesophageal reflux disease)   . HTN (hypertension)   . Shortness of breath    on exertion   Not able to verify he  takes these meds       Outpatient Medications Prior to Visit  Medication Sig Dispense Refill  . albuterol (PROVENTIL) (2.5 MG/3ML) 0.083% nebulizer solution Take 3 mLs (2.5 mg total)  by nebulization every 6 (six) hours as needed for wheezing or shortness of breath. 75 mL 12  . aspirin 325 MG tablet Take 325 mg by mouth daily.     . BD INSULIN SYRINGE U/F 31G X 5/16" 0.3 ML MISC USE WITH NOVOLIN TWICE DAY WITH A MEAL 100 each 5  . COMBIVENT RESPIMAT 20-100 MCG/ACT AERS respimat INHALE 1 PUFF INTO THE LUNGS EVERY 6 (SIX) HOURS. 1 Inhaler 11  . ferrous sulfate 325 (65 FE) MG tablet Take 1 tablet (325 mg total) by mouth daily with breakfast.  3  . glucose blood (ONE TOUCH ULTRA TEST) test strip Check BS tid 100 each 12  .  HYDROcodone-acetaminophen (NORCO) 5-325 MG tablet Take 1 tablet by mouth every 6 (six) hours as needed for moderate pain. 30 tablet 0  . insulin NPH-regular Human (NOVOLIN 70/30) (70-30) 100 UNIT/ML injection Inject 8 Units into the skin 2 (two) times daily with a meal. 10 mL 3  . Lancets Misc. MISC Use to check BS TID DX:E11.9 100 each 5  . metoprolol succinate (TOPROL-XL) 25 MG 24 hr tablet Take 1 tablet (25 mg total) by mouth daily. (Patient taking differently: Take 12.5 mg by mouth daily. ) 30 tablet 3  . nitroGLYCERIN (NITROSTAT) 0.4 MG SL tablet Place 0.4 mg under the tongue every 5 (five) minutes as needed for chest pain.    . pantoprazole (PROTONIX) 40 MG tablet Take 1 tablet (40 mg total) by mouth daily. 90 tablet 3  . PARoxetine (PAXIL) 20 MG tablet TAKE 1 TABLET BY MOUTH EVERY DAY 90 tablet 2  . tamsulosin (FLOMAX) 0.4 MG CAPS capsule TAKE 1 CAPSULE BY MOUTH DAILY AFTER SUPPER. 30 capsule 0  . meloxicam (MOBIC) 7.5 MG tablet Take 1 tablet (7.5 mg total) by mouth daily. 30 tablet 2  . clotrimazole (MYCELEX) 10 MG troche Take 1 tablet (10 mg total) by mouth 5 (five) times daily. (Patient not taking: Reported on 01/08/2018) 70 tablet 1  . pravastatin (PRAVACHOL) 40 MG tablet TAKE 1 TABLET BY MOUTH EVERY DAY (Patient not taking: Reported on 01/08/2018) 90 tablet 2   No facility-administered medications prior to visit.              Objective:   BP 124/66 (BP Location: Left Arm, Cuff Size: Normal)   Pulse 82   Ht 6\' 2"  (1.88 m)   Wt 138 lb (62.6 kg)   SpO2 98%   BMI 17.72 kg/m   SpO2: 98 %  RA     Wt Readings from Last 3 Encounters:  01/08/18 138 lb (62.6 kg)  12/23/17 144 lb (65.3 kg)  12/16/17 142 lb (64.4 kg)       Stoic wm very hard of hearing     HEENT: nl  turbinates bilaterally, and oropharynx. Nl external ear canals without cough reflex - a few bottom teeth only    NECK :  without JVD/Nodes/TM/ nl carotid upstrokes bilaterally   LUNGS: no  acc muscle use,  slt barrel contour, scattered insp and exp rhonchi none localized to RLL      CV:  RRR  no s3 or murmur or increase in P2, and no edema   ABD:  soft and nontender with nl inspiratory excursion in the supine position. No bruits or organomegaly appreciated, bowel sounds nl  MS:  Nl gait/ ext warm without deformities, calf tenderness, cyanosis or clubbing No obvious joint restrictions   SKIN: warm and dry without lesions    NEURO:  alert, approp, nl sensorium with  no motor or cerebellar deficits apparent.      I personally reviewed images and agree with radiology impression as follows:   Chest CT w/o contrast 12/23/17  Lungs/Pleura: The lungs are well aerated bilaterally and demonstrate biapical pleural and parenchymal scarring. Scattered parenchymal nodules are noted and stable from 2016 consistent with a benign etiology. No new sizable parenchymal nodules are seen. Diffuse emphysematous changes are identified bilaterally. In the right lower lobe there is again identified focal infiltrate consistent with acute pneumonia. This is similar to that seen on the recent chest x-ray from 12/11/2017. Considerable mucous plugging is noted centrally in the right infrahilar region related to the infiltrate.     CXR PA and Lateral:   01/08/2018 :    I personally reviewed images and agree with radiology impression as follows:   No change mostly R anterobasilar densities        Assessment   No problem-specific Assessment & Plan notes found for this encounter.     Christinia Gully, MD 01/08/2018         Assessment & Plan Note by Tanda Rockers, MD at 01/09/2018 5:33 AM   Author: Tanda Rockers, MD Author Type: Physician Filed: 01/09/2018 5:34 AM  Note Status: Written Cosign: Cosign Not Required Encounter Date: 01/08/2018  Problem: Cigarette smoker  Editor: Tanda Rockers, MD (Physician)    4-5 min discussion re active cigarette smoking in  addition to office E&M  Ask about tobacco use:   ongoing Advise quitting   Advised of immediate concerns with mucociliary function in setting of likely ongoing low grade aspiration being an obvious and reversible problem if he quits now  Assess willingness:  Not committed at this point Assist in quit attempt:  Per PCP when ready Arrange follow up:   Follow up per Primary Care planned            Assessment & Plan Note by Tanda Rockers, MD at 01/09/2018 5:30 AM   Author: Tanda Rockers, MD Author Type: Physician Filed: 01/09/2018 5:33 AM  Note Status: Written Cosign: Cosign Not Required Encounter Date: 01/08/2018  Problem: Pulmonary infiltrates on CXR  Editor: Tanda Rockers, MD (Physician)    Onset spring 2019 assoc with documented aspiration on DgEs 08/2017 in pt with poor cough mechanics  - cxr changes 12/11/17 no better 01/08/2018 with "mucus plugs" by CT chest 12/23/17 in active smoker - REC fob    this pattern is worrisome mostly for FB/ retained food and the anterobasilar distribution would be unusual for aspiration x that he usually sleeps prone  - malignancy is in the ddx since he's an active smoker (see separate a/p)   Discussed in detail all the  indications, usual  risks and alternatives  relative to the benefits with patient who agrees to proceed with bronchoscopy with tbbx of ant segment on R if not obvious airway findings             01/14/2018 Day of FOB:  No change in hx or or exam/ pt wishes to proceed   Christinia Gully, MD Pulmonary and [place] [phone] After 5:30 PM or weekends, use [facility] [phone]

## 2018-01-14 NOTE — Discharge Instructions (Signed)
Flexible Bronchoscopy, Care After These instructions give you information on caring for yourself after your procedure. Your doctor may also give you more specific instructions. Call your doctor if you have any problems or questions after your procedure. Follow these instructions at home:  Do not eat or drink anything for 2 hours after your procedure. If you try to eat or drink before the medicine wears off, food or drink could go into your lungs. You could also burn yourself.  After 2 hours have passed and when you can cough and gag normally, you may eat soft food and drink liquids slowly.  The day after the test, you may eat your normal diet.  You may do your normal activities.  Keep all doctor visits. Get help right away if:  You get more and more short of breath.  You get light-headed.  You feel like you are going to pass out (faint).  You have chest pain.  You have new problems that worry you.  You cough up more than a little blood.  You cough up more blood than before. This information is not intended to replace advice given to you by your health care provider. Make sure you discuss any questions you have with your health care provider. Document Released: 04/07/2009 Document Revised: 11/16/2015 Document Reviewed: 02/12/2013 Elsevier Interactive Patient Education  2017 [place].  Nothing to eat or drink until  11:30 am today 01/14/2018 Question and concerns please call the office at [phone]  Do not take any aspirin for today or the next two days - ok to restart 01/17/18

## 2018-01-14 NOTE — Progress Notes (Signed)
Video Bronchoscopy done  Intervention Bronchial  Washing Intervention  Bronchial biopsy Procedure tolerated well

## 2018-01-15 ENCOUNTER — Encounter (HOSPITAL_COMMUNITY): Payer: Self-pay | Admitting: Internal Medicine

## 2018-01-15 LAB — ACID FAST SMEAR (AFB, MYCOBACTERIA): Acid Fast Smear: NEGATIVE

## 2018-01-17 LAB — CULTURE, RESPIRATORY W GRAM STAIN

## 2018-01-17 LAB — CULTURE, RESPIRATORY

## 2018-01-19 ENCOUNTER — Other Ambulatory Visit: Payer: Self-pay | Admitting: Internal Medicine

## 2018-01-19 MED ORDER — CIPROFLOXACIN HCL 750 MG PO TABS: 750.0000 mg | ORAL_TABLET | Freq: Two times a day (BID) | ORAL | 0 refills | Status: DC

## 2018-01-19 NOTE — Progress Notes (Signed)
Spoke with the pt's grand daughter [name] and notified of recs per MW. She verbalized understanding. appt scheduled and cipro sent to pharm

## 2018-01-20 ENCOUNTER — Ambulatory Visit (INDEPENDENT_AMBULATORY_CARE_PROVIDER_SITE_OTHER): Payer: Medicare HMO | Admitting: Family Medicine

## 2018-01-20 ENCOUNTER — Encounter: Payer: Self-pay | Admitting: Family Medicine

## 2018-01-20 VITALS — BP 100/48 | HR 68 | Temp 97.9°F | Resp 18 | Ht 74.0 in | Wt 137.0 lb

## 2018-01-20 DIAGNOSIS — L309 Dermatitis, unspecified: Secondary | ICD-10-CM

## 2018-01-20 DIAGNOSIS — J181 Lobar pneumonia, unspecified organism: Secondary | ICD-10-CM

## 2018-01-20 DIAGNOSIS — R634 Abnormal weight loss: Secondary | ICD-10-CM

## 2018-01-20 DIAGNOSIS — J189 Pneumonia, unspecified organism: Secondary | ICD-10-CM

## 2018-01-20 MED ORDER — CIPROFLOXACIN HCL 750 MG PO TABS: 750.0000 mg | ORAL_TABLET | Freq: Two times a day (BID) | ORAL | 0 refills | Status: DC

## 2018-01-20 MED ORDER — FLUOCINONIDE 0.05 % EX OINT: 1.0000 "application " | TOPICAL_OINTMENT | Freq: Two times a day (BID) | CUTANEOUS | 0 refills | Status: DC

## 2018-01-20 MED ORDER — MIRTAZAPINE 30 MG PO TABS: 30.0000 mg | ORAL_TABLET | Freq: Every day | ORAL | 2 refills | Status: DC

## 2018-01-20 NOTE — Progress Notes (Signed)
Subjective:    Patient ID: [name], male    DOB: [birth year], 83 y.o.   MRN: [id number]  HPI  10/14/17 Patient states that over the last several weeks, he is developed severe pain in multiple joints and muscle groups.  He is having pain in his neck.  He describes the pain is almost like a "crick".  However he has had it for several weeks.  He is tender to palpation on the lateral transverse processes bilaterally up and down the cervical spine.  He is also tender to palpation with muscle tightness and soreness in the cervical paraspinal muscles bilaterally as well as in the trapezius.  He also complains of pain in his right shoulder.  However there is no decreased range of motion.  He has full abduction without pain.  Passive range of motion demonstrates no crepitus.  It does not exacerbate his pain or trigger pain.  He states that his shoulder aches and throbs constantly regardless of what he is doing.  He denies any pain in his left shoulder.  He is also complaining pain in his lower back.  This tends to be in the latissimus dorsi bilaterally.  It is located from around L4 down to his tailbone.  He denies any sciatica or numbness or weakness in his legs.  He denies any knee pain or ankle pain.  He denies any wrist pain or elbow pain.  Unfortunately recently his wife died from medical complications.  He is not sleeping well. At that time, my plan was: Differential diagnosis includes osteoarthritis in multiple joints, statin induced myopathy, autoimmune diseases such as PMR.  Begin by obtaining an x-ray of the cervical spine given the fact this is his worst pain.  I will also obtain a CBC, CMP, CK to evaluate for evidence of statin induced myopathy, and I will check a sedimentation rate to evaluate for possible underlying autoimmune diseases such as PMR.  I have asked the patient just continue pravastatin temporarily for 1 week to see if symptoms improve.  I will check a hemoglobin A1c to assess  management of his diabetes.  He continues to complain of diarrhea on metformin.  I have given the option of discontinuing metformin and replacing it with name brand medication such as invokana or jardiance.  He declines the change at the present time and elects to continue the metformin despite the diarrhea.  If sedimentation rate is extremely high, we may try the patient empirically on a trial of prednisone for possible PMR.  His sedimentation rate is normal, I believe the most likely explanation would be diffuse osteoarthritis unless his muscle pain improves after holding pravastatin for 1 week.  10/31/17 Patient feels much worse.  He has lost 5 pounds since his last visit.  He has no appetite.  He is losing weight.  There is now a painful mass in the right side of his neck.  It seems deep to the sternocleidomastoid muscle.  Is poorly defined.  There are not firm borders but there is a prominent fullness/lump in that area.  He reports dyspnea on exertion.  He reports diffuse myalgias and arthralgias.  Worse bone pain in his lower back around the level of L4 slightly to the left of midline.  He reports foul odor in his urine, he reports dark yellow urine sounding as though he may be dehydrated.  He reports fevers subjectively and chills.  He reports hot flashes.  All of these symptoms raise the concern about  possible underlying malignancy.  At that time, my plan was: I am concerned about possible underlying malignancy such as leukemia or lymphoma or multiple myeloma.  Have recommended a CT scan of the neck to evaluate the soft tissue swelling in the neck to rule out an abnormal lymph node.  Obtain urinalysis given the abnormal urine odor, obtain chest x-ray given the dyspnea on exertion, check CBC, TSH, CMP.  Await the results of the above studies and then proceed with further diagnostic work-up.  If there is significant proteinuria in the urinalysis, obtain an SPEP.  11/14/17 Admission on 01/14/2018,  Discharged on 01/14/2018  Component Date Value Ref Range Status  . Glucose-Capillary 01/14/2018 111* 70 - 99 mg/dL Final  . Specimen Description 01/14/2018    Final                   Value:BRONCHIAL ALVEOLAR LAVAGE Performed at [facility], [place] [address], [place], [place] [address]   . Special Requests 01/14/2018    Final                   Value:NONE Performed at [facility], [place] [address] [place], [place] [address]   . Gram Stain 01/14/2018    Final                   Value:RARE WBC PRESENT, PREDOMINANTLY MONONUCLEAR NO ORGANISMS SEEN Performed at [facility] [address] [place], [place] [address]   . Culture 01/14/2018 FEW PSEUDOMONAS AERUGINOSA   Final  . Report Status 01/14/2018 01/17/2018 FINAL   Final  . Organism ID, Bacteria 01/14/2018 PSEUDOMONAS AERUGINOSA   Final  . Fungus Stain 01/14/2018 Final report   Final   Comment: (NOTE) Performed At: [facility] [address] [place], [place] [id number] Rush Farmer MD IO:[id number]   . Fungus (Mycology) Culture 01/14/2018 PENDING   Incomplete  . Fungal Source 01/14/2018 BRONCHIAL ALVEOLAR LAVAGE   Final   Performed at [facility] [address], [place], [place] [address]  . AFB Specimen Processing 01/14/2018 Concentration   Final  . Acid Fast Smear 01/14/2018 Negative   Final   Comment: (NOTE) Performed At: [facility] [place], [place] [id number] Rush Farmer MD FY:[id number]   . Source (AFB) 01/14/2018 BRONCHIAL ALVEOLAR LAVAGE   Final   Performed at [facility] [address] [place], [place] [address]  . Glucose-Capillary 01/14/2018 120* 70 - 99 mg/dL Final  . Result 1 01/14/2018 Comment   Final   Comment: (NOTE) KOH/Calcofluor preparation:  no fungus observed. Performed At: [facility] [place], [place] [id number] Rush Farmer MD ER:[id number]    Labs  showed hematuria prompting CT to evaluate for renal stones.  Result is below: IMPRESSION: 1. Pneumonia in the medial aspect of the posterior segment right lower lobe.  2. Renal calculi bilaterally, more on the left than on the right. No hydronephrosis or ureteral calculus on either side.  3. There is a degree of urinary bladder wall thickening, felt to represent cystitis.  4. Extensive sigmoid diverticulosis without diverticulitis. No bowel obstruction. No abscess. Appendix appears normal.  5. Aortoiliac and common femoral artery atherosclerosis. No aneurysm. There also coronary artery calcifications.  Thankfully no malignancy was seen in the chest or in the abdomen and pelvis.  Patient was started on Levaquin for pneumonia and is here today for follow-up.  It is very difficult to get a  direct answer from the patient.  I asked him if he feels he is better, if his body aches are better, if his fatigue is better.  He simply states I do not know doc.  His granddaughter who is with him states that he is doing much better.  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  She states that clinically he is doing better however he continues to suffer from severe fatigue.  The painful mass in the right side of his neck has resolved after antibiotics making me believe this was likely a reactive lymph node due to his occult pneumonia.  He continues to complain of pain in the right side of his neck along the distribution of the trapezius but this seems to be muscular pain exacerbated by underlying arthritis.  As far as the diffuse body aches all over, this is resolved.  I asked the patient how he is doing after the death of his wife and he quietly begins to cry.  His granddaughter states that he is very depressed.  They were married 11 years.  He seems lost without her according to his granddaughter.  She believes this may be a large part of what is causing his various  somatic complaints.  At that time, my plan was: Neck mass has resolved after antibiotics.  I believe this was likely a reactive lymph node to his right sided pneumonia.  His dyspnea is better making me believe that this was related to his pneumonia.  His diffuse myalgias have improved aside from his neck and I believe this could have been due to his pneumonia.  I believe his severe fatigue could be underlying depression.  I have recommended trying Paxil 20 mg a day and then rechecking in 4 to 5 weeks.  I have also recommended grief counseling through hospice and I will try to schedule this for him.  12/10/17 Subjectively, the patient has decompensated since I last saw him.  He returned later and saw my partner for worsening cough, worsening shortness of breath.  They tried an additional round of antibiotics with no significant improvement.  Today he states that he feels terrible.  He is crying when I enter the room.  He states that he hurts as soon as he wakes up in the morning.  The location of the pain varies.  He can be in his lower back towards the left-hand side.  It also hurts over his left lower ribs to gentle palpation.  He also complains of chronic neck pain.  The reactive lymphadenopathy has returned to the right side of his neck.  He reports chills night sweats subjective fevers, worsening shortness of breath, constant cough, dyspnea on exertion.  He reports abdominal discomfort in the left lower quadrant.  He reports poor appetite.  He reports fatigue.  At that time, my plan was: Patient clinically looks terrible.  I am sending him immediately for a chest x-ray to evaluate for recurrent pneumonia versus a possible pulmonary mass.  I am ordered an x-ray of his lower back as well as of his ribs given the diffuse bone pain that he is having.  I will also be obtaining an SPEP to screen for multiple myeloma.  I will obtain an ultrasound of the neck to evaluate for the reactive lymphadenopathy present on  the right-hand side concerning for possible neoplasm.  Meanwhile check a CBC as well as a CMP and a sed rate.  I will treat the patient empirically for COPD exacerbation and cover for atypical infections with a Z-Pak, prednisone taper pack, and Combivent 1 inhalation every 6 hours.  Await the results of this extensive work-up above.  Addendum 6/20- No lytic lesions are seen in the back but their is another pneumonia.  Begin z pack, add amoxicillin 1 g tid for 10 days, take prednisone and combivent as prescribed.  If worsening, needs to go to hospital for IV abx.  Otherwise, recheck here Monday with Dr. Buelah Manis.  ESR and WBC elevated but again pneumonia seen on CXR.  If not improving, will get dedicated CT of chest to evaluate for malignancy.  Awaiting SPEP still given diffuse bone pain as well as [place] of neck to evaluate for atypical cervical lymph node.   12/23/17 Ultimately SPEP showed no M spike or evidence of multiple myeloma.  There were no lytic lesions seen on the lumbar spine x-ray.  His white count improved after antibiotics and steroids for his RLL pneumonia.  There has been a decline in his hemoglobin.  He was 12.7, 4 weeks ago.  It was down to 10.8 on the 20th.   We have scheduled the patient for CT scan of the chest todayThe ultrasound of his neck revealed no cervical lymphadenopathy or no worrisome pathologic lymph nodes to explain the pain on the right side of his neck.  I believe he was experiencing a reactive lymphadenopathy which is since resolved.    He is afebrile and his oxygen saturations have improved.  However subjectively, the patient feels no better.  Patient states that he feels weak and tired.  He reports easy fatigability.  He reports dyspnea but however this seems to be at his chronic baseline now.  He continues to complain of neck pain.  He also complains of low back pain that radiates into his left groin now.  He states that the pain today is a 1 on a scale of 1-10 but at times it  can be severe.  It tends to come and go with no exacerbating or alleviating factors.  He frequently tears up as he discusses the pain in his neck and back.  He states that he has been hurting primarily in his neck and back but really all over ever since his wife passed away and his sugars went out of control earlier this year.  I am struck by the fact that he cries occasionally as he discusses his back pain.  This seems out of proportion to the level of pain that he is describing and I believe indicates an underlying depression or sadness that could be contributing to his symptomatology.  At that time, my plan was: Patient states I just do not feel good.  However it is difficult to pin him down on his exact symptoms.  As best I can ascertain it is chronic dyspnea, chronic neck pain, chronic low back pain, easy fatigability, and I believe underlying depression and anhedonia.  Patient has a CT scan today given his persistent right lower lobe pneumonia to rule out an underlying malignancy.  If the CT scan shows no underlying pulmonary malignancy and resolution of his pneumonia, I would focus on chronic management of his COPD but starting the patient on Trelegy.  He can still use his nebulizer as needed for shortness of breath.  I will continue the patient on iron sulfate 325 mg p.o. twice daily and recheck a CBC in 1 month to monitor for improvement of his  anemia.  I am still waiting the results of his stool test to determine if there is evidence of GI blood loss.  Obviously if there is evidence of an ongoing GI blood loss, I will consult gastroenterology.  Regarding his depression, I would recommend switching his antidepressant from Paxil to Lexapro 10 mg a day to see if this will improve the patient's underlying anhedonia and depression.  Regarding his neck pain in his low back pain, he is using Tylenol as needed.  I will be glad to supplement with narcotics as needed for breakthrough pain.  I also offer the patient  a referral to orthopedics to discuss possible facet joint injections in the neck and in the lower back for his arthritic bone pain.  Patient will consider this but at the present time declines.  I truly believe that if the CT scan rules out an underlying malignancy that the patient's symptoms are multifactorial and likely are due to a combination of recurrent community-acquired pneumonia, underlying COPD, mild anemia secondary to iron deficiency, and depression and grief over the loss of his wife of more than 60 years coupled with degenerative disc disease in his neck and his lower back.  Bit more than 45 minutes today with the patient explaining all this in detail.  Unfortunately I feel that he is frustrated because he does not feel better and he has been extremely patient with me in trying to help him  01/20/18  Patient is here today reporting an itchy rash on his back for 2 weeks.  On examination today he has a diffuse excoriations and scratch marks on his shoulders and on his lower back.  There is a very mild eczematous form rash on both flanks.  This consist of small non-discrete erythematous papules coalescing into a patch on both sides.  The rash is mild in appearance however there are numerous excoriations raising the suspicion for neurogenic paresthesias causing the itching in addition to his chronic atopic dermatitis.  Since I last saw him, he has had a bronchoscopy that revealed pseudomonas aeruginosa.  Dr. Melvyn Novas called out Cipro but the patient was unaware of this.  He has not started the antibiotic yet.  I went ahead and prescribe Cipro 750 mg p.o. twice daily for 10 days and instructed the patient to go get this immediately.  He is also lost 5 additional pounds since I last saw him.  He reports very little appetite. Past Medical History:  Diagnosis Date  . Acute venous embolism and thrombosis of unspecified deep vessels of lower extremity   . Atrial flutter (HCC)    and SVT that is post ablation   . CAD (coronary artery disease)   . Chronic kidney disease    stones  . Coronary atherosclerosis 11/19/2008   Qualifier: Diagnosis of  By: Stanford Breed, MD, Kandyce Rud   . Dyslipidemia   . GERD (gastroesophageal reflux disease)   . HTN (hypertension)   . Shortness of breath    on exertion   Past Surgical History:  Procedure Laterality Date  . CARDIAC CATHETERIZATION     s/p cardiac catheterization and bypass surgery as well as EGD with dilation x3, left lower extremity vein stripping  . CORONARY ARTERY BYPASS GRAFT    . ESOPHAGOGASTRODUODENOSCOPY N/A 12/06/2013   Procedure: ESOPHAGOGASTRODUODENOSCOPY (EGD);  Surgeon: Lear Ng, MD;  Location: [facility];  Service: Endoscopy;  Laterality: N/A;  . left knee anthroscopy    . SECONDARY CLOSURE ARM    . VIDEO  BRONCHOSCOPY Bilateral 01/14/2018   Procedure: VIDEO BRONCHOSCOPY WITHOUT FLUORO;  Surgeon: Tanda Rockers, MD;  Location: [facility];  Service: Cardiopulmonary;  Laterality: Bilateral;   Current Outpatient Medications on File Prior to Visit  Medication Sig Dispense Refill  . albuterol (PROVENTIL) (2.5 MG/3ML) 0.083% nebulizer solution Take 3 mLs (2.5 mg total) by nebulization every 6 (six) hours as needed for wheezing or shortness of breath. (Patient taking differently: Take 2.5 mg by nebulization 2 (two) times daily. ) 75 mL 12  . aspirin 325 MG tablet Take 325 mg by mouth daily.     . COMBIVENT RESPIMAT 20-100 MCG/ACT AERS respimat INHALE 1 PUFF INTO THE LUNGS EVERY 6 (SIX) HOURS. 1 Inhaler 11  . ferrous sulfate 325 (65 FE) MG tablet Take 1 tablet (325 mg total) by mouth daily with breakfast.  3  . guaiFENesin (MUCINEX) 600 MG 12 hr tablet Take 600 mg by mouth 2 (two) times daily.    Marland Kitchen HYDROcodone-acetaminophen (NORCO) 5-325 MG tablet Take 1 tablet by mouth every 6 (six) hours as needed for moderate pain. (Patient taking differently: Take 1 tablet by mouth at bedtime as needed for moderate pain. ) 30 tablet 0  .  insulin NPH-regular Human (NOVOLIN 70/30) (70-30) 100 UNIT/ML injection Inject 8 Units into the skin 2 (two) times daily with a meal. (Patient taking differently: Inject 8-12 Units into the skin See admin instructions. Inject 8 units SQ in the morning and inject 12 units SQ at bedtime) 10 mL 3  . meloxicam (MOBIC) 7.5 MG tablet Take 1 tablet (7.5 mg total) by mouth daily. 30 tablet 2  . Menthol, Topical Analgesic, (ICY HOT EX) Apply 1 application topically daily.    . metoprolol succinate (TOPROL-XL) 25 MG 24 hr tablet Take 1 tablet (25 mg total) by mouth daily. (Patient taking differently: Take 12.5 mg by mouth daily. ) 30 tablet 3  . nitroGLYCERIN (NITROSTAT) 0.4 MG SL tablet Place 0.4 mg under the tongue every 5 (five) minutes as needed for chest pain.    . pantoprazole (PROTONIX) 40 MG tablet Take 1 tablet (40 mg total) by mouth daily. 90 tablet 3  . PARoxetine (PAXIL) 20 MG tablet TAKE 1 TABLET BY MOUTH EVERY DAY 90 tablet 2  . Polyvinyl Alcohol (LIQUID TEARS OP) Place 1 drop into both eyes 2 (two) times daily as needed (for dry eyes).    . tamsulosin (FLOMAX) 0.4 MG CAPS capsule TAKE 1 CAPSULE BY MOUTH DAILY AFTER SUPPER. 30 capsule 0   No current facility-administered medications on file prior to visit.    Allergies  Allergen Reactions  . Celebrex [Celecoxib] Rash and Other (See Comments)    Blister   Social History   Socioeconomic History  . Marital status: Widowed    Spouse name: Not on file  . Number of children: Not on file  . Years of education: Not on file  . Highest education level: Not on file  Occupational History  . Not on file  Social Needs  . Financial resource strain: Not on file  . Food insecurity:    Worry: Not on file    Inability: Not on file  . Transportation needs:    Medical: Not on file    Non-medical: Not on file  Tobacco Use  . Smoking status: Current Every Day Smoker    Packs/day: 1.00    Years: 60.00    Pack years: 60.00    Types: Cigarettes  .  Smokeless tobacco: Never Used  Substance  and Sexual Activity  . Alcohol use: No  . Drug use: No  . Sexual activity: Not on file  Lifestyle  . Physical activity:    Days per week: Not on file    Minutes per session: Not on file  . Stress: Not on file  Relationships  . Social connections:    Talks on phone: Not on file    Gets together: Not on file    Attends religious service: Not on file    Active member of club or organization: Not on file    Attends meetings of clubs or organizations: Not on file    Relationship status: Not on file  . Intimate partner violence:    Fear of current or ex partner: Not on file    Emotionally abused: Not on file    Physically abused: Not on file    Forced sexual activity: Not on file  Other Topics Concern  . 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       Review of Systems  All other systems reviewed and are negative.      Objective:   Physical Exam  Constitutional: He is oriented to person, place, and time. He appears well-developed and well-nourished. No distress.  Neck: Normal range of motion. Neck supple. No JVD present. Spinous process tenderness and muscular tenderness present. No tracheal deviation present.  Cardiovascular: Normal rate, regular rhythm and normal heart sounds. Exam reveals no gallop and no friction rub.  No murmur heard. Pulmonary/Chest: Effort normal. No respiratory distress. He has no wheezes. He has no rales. He exhibits no tenderness.  Abdominal: Soft. Bowel sounds are normal. He exhibits no distension. There is no tenderness. There is no rebound.  Musculoskeletal:       Lumbar back: He exhibits pain. He exhibits no tenderness, no bony tenderness, no swelling, no edema, no deformity and no spasm.       Back:  Lymphadenopathy:    He has no cervical adenopathy.  Neurological: He is  alert and oriented to person, place, and time. He exhibits normal muscle tone. Coordination normal.  Skin: No rash noted. He is not diaphoretic. No erythema.  Vitals reviewed.         Assessment & Plan:  Pneumonia of right lower lobe due to infectious organism ([facility])  Weight loss  Eczema, unspecified type  I believe his itching is a combination of atopic dermatitis/eczema and likely neurogenic itching/paresthesias.  We will try Lidex ointment applied twice daily for 1 week and see if his symptoms improve.  Consider hydroxyzine versus gabapentin if no better.  Given his weight loss which I believe is due to a combination of poor p.o. intake and chronic medical problems, and depression, I will add Remeron 30 mg p.o. nightly as an appetite stimulant and to try to help him sleep.  I called out Cipro 750 mg p.o. twice daily for his Pseudomonas found on his BAL.  Reassess  the patient in 2 weeks.

## 2018-01-26 ENCOUNTER — Other Ambulatory Visit: Payer: Self-pay | Admitting: Family Medicine

## 2018-01-29 ENCOUNTER — Other Ambulatory Visit: Payer: Self-pay | Admitting: Family Medicine

## 2018-01-29 DIAGNOSIS — R69 Illness, unspecified: Secondary | ICD-10-CM | POA: Diagnosis not present

## 2018-02-02 ENCOUNTER — Ambulatory Visit: Payer: Medicare HMO | Admitting: Primary Care

## 2018-02-02 ENCOUNTER — Encounter: Payer: Self-pay | Admitting: Primary Care

## 2018-02-02 ENCOUNTER — Telehealth: Payer: Self-pay

## 2018-02-02 ENCOUNTER — Ambulatory Visit (INDEPENDENT_AMBULATORY_CARE_PROVIDER_SITE_OTHER)
Admission: RE | Admit: 2018-02-02 | Discharge: 2018-02-02 | Disposition: A | Discharge status: Home / Self Care | Payer: Medicare HMO | Source: Ambulatory Visit | Attending: Primary Care | Admitting: Primary Care

## 2018-02-02 ENCOUNTER — Other Ambulatory Visit: Payer: Self-pay | Admitting: Primary Care

## 2018-02-02 VITALS — BP 122/64 | HR 60 | Ht 74.0 in | Wt 137.6 lb

## 2018-02-02 DIAGNOSIS — J151 Pneumonia due to Pseudomonas: Secondary | ICD-10-CM

## 2018-02-02 DIAGNOSIS — R918 Other nonspecific abnormal finding of lung field: Secondary | ICD-10-CM

## 2018-02-02 DIAGNOSIS — R1310 Dysphagia, unspecified: Secondary | ICD-10-CM

## 2018-02-02 DIAGNOSIS — Z681 Body mass index (BMI) 19 or less, adult: Secondary | ICD-10-CM | POA: Diagnosis not present

## 2018-02-02 DIAGNOSIS — J189 Pneumonia, unspecified organism: Secondary | ICD-10-CM | POA: Diagnosis not present

## 2018-02-02 NOTE — Assessment & Plan Note (Signed)
-   Reviewed aspiration precautions at length

## 2018-02-02 NOTE — Telephone Encounter (Signed)
LMOM for him to call back-needs to have a chest X-ray before visit.  Thanks.

## 2018-02-02 NOTE — Patient Instructions (Addendum)
Repeat chest xray in 3 weeks  FU visit in 1 month with Dr. Melvyn Novas  Continue liquid mucinex Add Ensure shakes twice a day  Return if symptoms worsen

## 2018-02-02 NOTE — Assessment & Plan Note (Addendum)
-   Persistent RLL infiltrate, bronchoscopy grew PSEUDOMONAS AERUGINOSA.  - Completed cipro 750mg  BID x10 days - Repeat CXR 02/02/18- previous PNA in right lung base is improved, residual opacity recommend follow-up to ensure resolution - FU in 1 month with Dr. Melvyn Novas, CXR in 3-4 weeks

## 2018-02-02 NOTE — Assessment & Plan Note (Addendum)
-  Recommend ensure shakes (sugar free) twice daily

## 2018-02-02 NOTE — Progress Notes (Signed)
@Patient  ID: [name], male    DOB: 17-Jun-1935, 83 y.o.   MRN: [id number]  Chief Complaint  Patient presents with  . Follow-up    about the same    Referring provider: Susy Frizzle, MD  HPI: 83 year old male, current smoker. Hx HTN, aflutter, pulmonary infiltrates, lung nodule, dysphagia, silent aspiration. Patient of Dr. Melvyn Novas, referred 01/08/2018 by Dr. Dennard Schaumann for eval infiltrates on cxr RLL ant basal segment first noted 12/11/17 with suggestion of mucus plugging by CT chest 12/24/17.   Chest CT w/o contrast 12/23/17  Lungs/Pleura: The lungs are well aerated bilaterally and demonstrate biapical pleural and parenchymal scarring. Scattered parenchymal nodules are noted and stable from 2016 consistent with a benign etiology. No new sizable parenchymal nodules are seen. Diffuse emphysematous changes are identified bilaterally. In the right lower lobe there is again identified focal infiltrate consistent with acute pneumonia. This is similar to that seen on the recent chest x-ray from 12/11/2017. Considerable mucous plugging is noted centrally in the right infrahilar region related to the infiltrate.  CXR PA and Lateral 01/08/18: No change mostly R anterobasilar densities    02/02/2018 Patient underwent a bronchoscopy to evaluate persistent RLL infiltrate. Bronchoscopy grew out pseudomonas, started on ciprofloxacin 750mg  BID x 10 days. Presents today for follow-up eval with his grandaughter. Repeat CXR showed improved RLL PNA with residual opacity.  Grandaughter feels patient is doing a little better. He continues to have some fatigue and does not do well walking long distances. Taking liquid mucinex twice daily. He denies cough, shortness of breath at rest or wheeze.   PCP Recommendations If the CT scan shows no underlying pulmonary malignancy and resolution of his pneumonia, I would focus on chronic management of his COPD but starting the patient on Trelegy.  He can still use  his nebulizer as needed for shortness of breath.  Allergies  Allergen Reactions  . Celebrex [Celecoxib] Rash and Other (See Comments)    Blister    Immunization History  Administered Date(s) Administered  . Influenza, High Dose Seasonal PF 03/31/2017  . Influenza,inj,Quad PF,6+ Mos 05/03/2016  . Pneumococcal Polysaccharide-23 05/02/2016  . Tdap 04/24/2015    Past Medical History:  Diagnosis Date  . Acute venous embolism and thrombosis of unspecified deep vessels of lower extremity   . Atrial flutter (HCC)    and SVT that is post ablation  . CAD (coronary artery disease)   . Chronic kidney disease    stones  . Coronary atherosclerosis 11/19/2008   Qualifier: Diagnosis of  By: Stanford Breed, MD, Kandyce Rud   . Dyslipidemia   . GERD (gastroesophageal reflux disease)   . HTN (hypertension)   . Shortness of breath    on exertion    Tobacco History: Social History   Tobacco Use  Smoking Status Current Every Day Smoker  . Packs/day: 1.00  . Years: 60.00  . Pack years: 60.00  . Types: Cigarettes  Smokeless Tobacco Never Used   Ready to quit: Not Answered Counseling given: Not Answered   Outpatient Medications Prior to Visit  Medication Sig Dispense Refill  . albuterol (PROVENTIL) (2.5 MG/3ML) 0.083% nebulizer solution Take 3 mLs (2.5 mg total) by nebulization every 6 (six) hours as needed for wheezing or shortness of breath. (Patient taking differently: Take 2.5 mg by nebulization 2 (two) times daily. ) 75 mL 12  . aspirin 325 MG tablet Take 325 mg by mouth daily.     . COMBIVENT RESPIMAT 20-100 MCG/ACT AERS  respimat INHALE 1 PUFF INTO THE LUNGS EVERY 6 (SIX) HOURS. 1 Inhaler 11  . ferrous sulfate 325 (65 FE) MG tablet Take 1 tablet (325 mg total) by mouth daily with breakfast.  3  . fluocinonide ointment (LIDEX) 4.58 % Apply 1 application topically 2 (two) times daily. 30 g 0  . guaiFENesin (MUCINEX) 600 MG 12 hr tablet Take 600 mg by mouth 2 (two) times daily.      Marland Kitchen HYDROcodone-acetaminophen (NORCO) 5-325 MG tablet Take 1 tablet by mouth every 6 (six) hours as needed for moderate pain. (Patient taking differently: Take 1 tablet by mouth at bedtime as needed for moderate pain. ) 30 tablet 0  . insulin NPH-regular Human (NOVOLIN 70/30) (70-30) 100 UNIT/ML injection Inject 8 Units into the skin 2 (two) times daily with a meal. (Patient taking differently: Inject 8-12 Units into the skin See admin instructions. Inject 8 units SQ in the morning and inject 12 units SQ at bedtime) 10 mL 3  . meloxicam (MOBIC) 7.5 MG tablet Take 1 tablet (7.5 mg total) by mouth daily. 30 tablet 2  . Menthol, Topical Analgesic, (ICY HOT EX) Apply 1 application topically daily.    . metoprolol succinate (TOPROL-XL) 25 MG 24 hr tablet Take 1 tablet (25 mg total) by mouth daily. (Patient taking differently: Take 12.5 mg by mouth daily. ) 30 tablet 3  . mirtazapine (REMERON) 30 MG tablet Take 1 tablet (30 mg total) by mouth at bedtime. 30 tablet 2  . nitroGLYCERIN (NITROSTAT) 0.4 MG SL tablet Place 0.4 mg under the tongue every 5 (five) minutes as needed for chest pain.    Glory Rosebush DELICA LANCETS 09X MISC USE TO CHECK BLOOD SUAGR 3 TIMES A DAY DX:E11.9 100 each 5  . pantoprazole (PROTONIX) 40 MG tablet Take 1 tablet (40 mg total) by mouth daily. 90 tablet 3  . PARoxetine (PAXIL) 20 MG tablet TAKE 1 TABLET BY MOUTH EVERY DAY 90 tablet 2  . Polyvinyl Alcohol (LIQUID TEARS OP) Place 1 drop into both eyes 2 (two) times daily as needed (for dry eyes).    . tamsulosin (FLOMAX) 0.4 MG CAPS capsule TAKE 1 CAPSULE BY MOUTH DAILY AFTER SUPPER. 90 capsule 3  . ciprofloxacin (CIPRO) 750 MG tablet Take 1 tablet (750 mg total) by mouth 2 (two) times daily. 20 tablet 0   No facility-administered medications prior to visit.     Review of Systems  Review of Systems  Constitutional: Positive for fatigue. Negative for activity change, chills, diaphoresis and fever.  HENT: Negative.   Respiratory:  Negative for cough and wheezing.        DOE with activity  Cardiovascular: Negative.    Physical Exam  BP 122/64 (BP Location: Left Arm, Cuff Size: Normal)   Pulse 60   Ht 6\' 2"  (1.88 m)   Wt 137 lb 9.6 oz (62.4 kg)   SpO2 98%   BMI 17.67 kg/m  Physical Exam  Constitutional: He is oriented to person, place, and time.  Underweight   HENT:  Head: Normocephalic and atraumatic.  Neck: Normal range of motion. Neck supple.  Cardiovascular: Normal rate and regular rhythm.  Pulmonary/Chest: Effort normal. No stridor. No respiratory distress. He has no wheezes.  LS clear, dim right lower base  Musculoskeletal: Normal range of motion.  Neurological: He is alert and oriented to person, place, and time.  Skin: Skin is warm and dry.     Lab Results:  CBC    Component Value Date/Time  WBC 8.8 12/16/2017 1131   RBC 3.65 (L) 12/16/2017 1131   HGB 10.2 (L) 12/16/2017 1131   HCT 31.9 (L) 12/16/2017 1131   PLT 306 12/16/2017 1131   MCV 87.4 12/16/2017 1131   MCH 27.9 12/16/2017 1131   MCHC 32.0 12/16/2017 1131   RDW 14.0 12/16/2017 1131   LYMPHSABS 1,874 12/16/2017 1131   MONOABS 1.1 (H) 07/31/2017 0325   EOSABS 642 (H) 12/16/2017 1131   BASOSABS 79 12/16/2017 1131    BMET    Component Value Date/Time   NA 139 12/16/2017 1131   K 5.1 12/16/2017 1131   CL 105 12/16/2017 1131   CO2 24 12/16/2017 1131   GLUCOSE 78 12/16/2017 1131   BUN 26 (H) 12/16/2017 1131   CREATININE 1.26 (H) 12/16/2017 1131   CALCIUM 9.0 12/16/2017 1131   GFRNONAA 53 (L) 12/16/2017 1131   GFRAA 62 12/16/2017 1131    BNP No results found for: BNP  ProBNP No results found for: PROBNP  Imaging: Dg Chest 2 View  Result Date: 02/02/2018 CLINICAL DATA:  Follow-up right lower lobe infiltrate EXAM: CHEST - 2 VIEW COMPARISON:  January 14, 2018 FINDINGS: The heart size and mediastinal contours are stable. Patient status post prior median sternotomy and CABG. Previously noted pneumonia in the right lung base  is improved with residual opacity identified. The left lung is clear. The visualized skeletal structures are stable. IMPRESSION: Previously noted pneumonia in the right lung base is improved with residual opacity identified. Continued follow-up is recommended to ensure resolution. Electronically Signed   By: Abelardo Diesel M.D.   On: 02/02/2018 10:56   Dg Chest 2 View  Result Date: 01/09/2018 CLINICAL DATA:  Follow-up abnormal chest x-ray EXAM: CHEST - 2 VIEW COMPARISON:  None. FINDINGS: There is hyperinflation of the lungs compatible with COPD. Prior CABG. Heart is normal size. No confluent opacity on the left. Persistent density at the right lung base, similar to prior chest x-ray and chest CT. No effusions or acute bony abnormality. IMPRESSION: Persistent right basilar pneumonia, similar prior study. Consider further evaluation with bronchoscopy. COPD, CABG. Electronically Signed   By: Rolm Baptise M.D.   On: 01/09/2018 08:17   Dg Chest Port 1 View  Result Date: 01/14/2018 CLINICAL DATA:  Status post bronchoscopy with biopsy. EXAM: PORTABLE CHEST 1 VIEW COMPARISON:  None. FINDINGS: Heart size is normal. Status post median sternotomy for CABG. Hyperinflated lungs consistent with COPD. Increased opacity at the RIGHT base compared with the pre-procedure film, which could represent post biopsy hemorrhage. No pneumothorax. No acute osseous findings. Aortic atherosclerosis. IMPRESSION: Increased opacity at the RIGHT base compared with preprocedure film which could represent post biopsy hemorrhage. No pneumothorax. Electronically Signed   By: Staci Righter M.D.   On: 01/14/2018 10:52   Dg C-arm Bronchoscopy  Result Date: 01/14/2018 C-ARM BRONCHOSCOPY: Fluoroscopy was utilized by the requesting physician.  No radiographic interpretation.     Assessment & Plan:   Pseudomonas pneumonia ([place]) - Persistent RLL infiltrate, bronchoscopy grew PSEUDOMONAS AERUGINOSA.  - Completed cipro 750mg  BID x10 days -  Repeat CXR 02/02/18- previous PNA in right lung base is improved, residual opacity recommend follow-up to ensure resolution - FU in 1 month with Dr. Melvyn Novas, CXR in 3-4 weeks   Dysphagia - Reviewed aspiration precautions at length   Adult BMI <19 kg/sq m -Recommend ensure shakes (sugar free) twice daily      Martyn Ehrich, NP 02/02/2018

## 2018-02-02 NOTE — Progress Notes (Signed)
Chart and office note reviewed in detail along with available xrays/ labs > agree with a/p as outlined  

## 2018-02-05 ENCOUNTER — Other Ambulatory Visit: Payer: Self-pay | Admitting: Family Medicine

## 2018-02-05 MED ORDER — HYDROCODONE-ACETAMINOPHEN 5-325 MG PO TABS: 1.0000 | ORAL_TABLET | Freq: Four times a day (QID) | ORAL | 0 refills | Status: DC | PRN

## 2018-02-05 NOTE — Telephone Encounter (Signed)
Refill on hydrocodone to [address]

## 2018-02-05 NOTE — Telephone Encounter (Signed)
Patient is requesting a refill on Hydrocodone   LOV: 01/20/18  LRF:   01/08/18

## 2018-02-12 DIAGNOSIS — R69 Illness, unspecified: Secondary | ICD-10-CM | POA: Diagnosis not present

## 2018-02-12 LAB — FUNGAL ORGANISM REFLEX

## 2018-02-12 LAB — FUNGUS CULTURE WITH STAIN

## 2018-02-12 LAB — FUNGUS CULTURE RESULT

## 2018-02-25 ENCOUNTER — Other Ambulatory Visit: Payer: Self-pay | Admitting: Cardiology

## 2018-02-25 DIAGNOSIS — E785 Hyperlipidemia, unspecified: Secondary | ICD-10-CM

## 2018-03-05 ENCOUNTER — Encounter: Payer: Self-pay | Admitting: Internal Medicine

## 2018-03-05 ENCOUNTER — Ambulatory Visit (INDEPENDENT_AMBULATORY_CARE_PROVIDER_SITE_OTHER)
Admission: RE | Admit: 2018-03-05 | Discharge: 2018-03-05 | Disposition: A | Discharge status: Home / Self Care | Payer: Medicare HMO | Source: Ambulatory Visit | Attending: Internal Medicine | Admitting: Internal Medicine

## 2018-03-05 ENCOUNTER — Telehealth: Payer: Self-pay

## 2018-03-05 ENCOUNTER — Ambulatory Visit: Payer: Medicare HMO | Admitting: Internal Medicine

## 2018-03-05 VITALS — BP 138/68 | HR 61 | Ht 74.0 in | Wt 143.0 lb

## 2018-03-05 DIAGNOSIS — J189 Pneumonia, unspecified organism: Secondary | ICD-10-CM | POA: Diagnosis not present

## 2018-03-05 DIAGNOSIS — R918 Other nonspecific abnormal finding of lung field: Secondary | ICD-10-CM | POA: Diagnosis not present

## 2018-03-05 DIAGNOSIS — R69 Illness, unspecified: Secondary | ICD-10-CM | POA: Diagnosis not present

## 2018-03-05 DIAGNOSIS — F17210 Nicotine dependence, cigarettes, uncomplicated: Secondary | ICD-10-CM | POA: Diagnosis not present

## 2018-03-05 MED ORDER — PANTOPRAZOLE SODIUM 40 MG PO TBEC: DELAYED_RELEASE_TABLET | ORAL | 3 refills | Status: DC

## 2018-03-05 NOTE — Patient Instructions (Addendum)
Increase pantoprazole to 40 mg Take 30- 60 min before your first and last meals of the day   GERD (REFLUX)  is an extremely common cause of respiratory symptoms just like yours , many times with no obvious heartburn at all.    It can be treated with medication, but also with lifestyle changes including elevation of the head of your bed (ideally with 6 inch  bed blocks),  Smoking cessation, avoidance of late meals, excessive alcohol, and avoid fatty foods, chocolate, peppermint, colas, red wine, and acidic juices such as orange juice.  NO MINT OR MENTHOL PRODUCTS SO NO COUGH DROPS   USE SUGARLESS CANDY INSTEAD (Jolley ranchers or Stover's or Life Savers) or even ice chips will also do - the key is to swallow to prevent all throat clearing. NO OIL BASED VITAMINS - use powdered substitutes.    If throat not some better after 4 weeks on this plan then call me for referral to ENT   Please remember to go to the  x-ray department downstairs in the basement  for your tests - we will call you with the results when they are available.      Please schedule a follow up visit in 3 months but call sooner if needed

## 2018-03-05 NOTE — Telephone Encounter (Signed)
See result note.  

## 2018-03-05 NOTE — Progress Notes (Signed)
[name], male    DOB: [birth year], 83 y.o.   MRN: [id number]    Brief patient profile:  51 yowm active smoker with new onset sob x 2014 worse since April 2019  Assoc with chronic dysphagia with documented mild silent aspiration by DgEs 08/29/17 and not able to swallow barium tablet.  Referred 01/08/2018 by Dr Dennard Schaumann for eval infiltrates on cxr RLL ant basal segment first noted 12/11/17 with suggestion of mucus plugging by CT chest 12/24/17    History of Present Illness  01/08/2018 1st pulmonary office eval/ Lowen Barringer Chief Complaint  Patient presents with  . Pulmonary Consult    Referred by Dr. Dennard Schaumann. Pt states he has had recurrent PNA since April 2019. He states he always feels SOB with or without exertion. He is using albuterol nebs 2 x daily.    Dyspnea: uphill when walks back from mailbox= MMRC1 = can walk nl pace, flat grade, can't hurry or go uphills or steps s sob    Cough: rattling / worse since April  2019 some better with abx transiently each time  / min mucoid sputum esp in am  Sleep: sleeps prone SABA use: not sure they help   Chronic dysphagia, lost about 10 lb since onset of coughing April 2019  Already eval by [facility] and Gdaughter says he's been following instructions but not helping swallowing rec The key is to stop smoking completely before smoking completely stops you!  Best cough medication mucinex 1200 mg every 12 hours as needed      02/02/18 NP rec Repeat chest xray in 3 weeks  FU visit in 1 month with Dr. Melvyn Novas Continue liquid mucinex Add Ensure shakes twice a day     03/05/2018  f/u ov/Kenneisha Cochrane re:  Chief Complaint  Patient presents with  . Follow-up    Breathing has improved. He has occ cough with white sputum. He has not needed his neb or combivent inhaler.   Dyspnea:  MMRC1 = can walk nl pace, flat grade, can't hurry or go uphills or steps s sob  Improved mb to house  Cough: better / some white in am/ still occ choking on food  Sleeping:  Lies flat/ one  pillow SABA use: none used - has combivent not using  02: no    No obvious day to day or daytime variability or assoc excess/ purulent sputum or mucus plugs or hemoptysis or cp or chest tightness, subjective wheeze or overt sinus or hb symptoms.   Sleeping as above  without nocturnal  or early am exacerbation  of respiratory  c/o's or need for noct saba. Also denies any obvious fluctuation of symptoms with weather or environmental changes or other aggravating or alleviating factors except as outlined above   No unusual exposure hx or h/o childhood pna/ asthma or knowledge of premature birth.  Current Allergies, Complete Past Medical History, Past Surgical History, Family History, and Social History were reviewed in [employer] record.  ROS  The following are not active complaints unless bolded Hoarseness, sore throat, dysphagia, dental problems, itching, sneezing,  nasal congestion or discharge of excess mucus or purulent secretions, ear ache,   fever, chills, sweats, unintended wt loss or wt gain, classically pleuritic or exertional cp,  orthopnea pnd or arm/hand swelling  or leg swelling, presyncope, palpitations, abdominal pain, anorexia, nausea, vomiting, diarrhea  or change in bowel habits or change in bladder habits, change in stools or change in urine, dysuria, hematuria,  rash, arthralgias,  visual complaints, headache, numbness, weakness or ataxia or problems with walking or coordination,  change in mood or  memory.        Current Meds  Medication Sig  . albuterol (PROVENTIL) (2.5 MG/3ML) 0.083% nebulizer solution Take 3 mLs (2.5 mg total) by nebulization every 6 (six) hours as needed for wheezing or shortness of breath. (Patient taking differently: Take 2.5 mg by nebulization 2 (two) times daily. )  . aspirin 325 MG tablet Take 325 mg by mouth daily.   . COMBIVENT RESPIMAT 20-100 MCG/ACT AERS respimat INHALE 1 PUFF INTO THE LUNGS EVERY 6 (SIX) HOURS.  . ferrous  sulfate 325 (65 FE) MG tablet Take 1 tablet (325 mg total) by mouth daily with breakfast.  . fluocinonide ointment (LIDEX) 0.10 % Apply 1 application topically 2 (two) times daily.  Marland Kitchen guaiFENesin (MUCINEX) 600 MG 12 hr tablet Take 600 mg by mouth 2 (two) times daily.  Marland Kitchen HYDROcodone-acetaminophen (NORCO) 5-325 MG tablet Take 1 tablet by mouth every 6 (six) hours as needed for moderate pain.  Marland Kitchen insulin NPH-regular Human (NOVOLIN 70/30) (70-30) 100 UNIT/ML injection Inject 8 Units into the skin 2 (two) times daily with a meal. (Patient taking differently: Inject 8-12 Units into the skin See admin instructions. Inject 8 units SQ in the morning and inject 12 units SQ at bedtime)  . meloxicam (MOBIC) 7.5 MG tablet Take 1 tablet (7.5 mg total) by mouth daily.  . Menthol, Topical Analgesic, (ICY HOT EX) Apply 1 application topically daily.  . metoprolol succinate (TOPROL-XL) 25 MG 24 hr tablet Take 1 tablet (25 mg total) by mouth daily. (Patient taking differently: Take 12.5 mg by mouth daily. )  . mirtazapine (REMERON) 30 MG tablet Take 1 tablet (30 mg total) by mouth at bedtime.  . nitroGLYCERIN (NITROSTAT) 0.4 MG SL tablet Place 0.4 mg under the tongue every 5 (five) minutes as needed for chest pain.  Glory Rosebush DELICA LANCETS 27O MISC USE TO CHECK BLOOD SUAGR 3 TIMES A DAY DX:E11.9  .     . PARoxetine (PAXIL) 20 MG tablet TAKE 1 TABLET BY MOUTH EVERY DAY  . Polyvinyl Alcohol (LIQUID TEARS OP) Place 1 drop into both eyes 2 (two) times daily as needed (for dry eyes).  . pravastatin (PRAVACHOL) 40 MG tablet TAKE 1 TABLET BY MOUTH EVERY DAY  . tamsulosin (FLOMAX) 0.4 MG CAPS capsule TAKE 1 CAPSULE BY MOUTH DAILY AFTER SUPPER.  .   pantoprazole (PROTONIX) 40 MG tablet Take 1 tablet (40 mg total) by mouth daily.                   Objective:      stoic frail elderly wm nad / extremely hard of hearing   03/05/2018       143   01/08/18 138 lb (62.6 kg)  12/23/17 144 lb (65.3 kg)  12/16/17 142  lb (64.4 kg)     Vital signs reviewed - Note on arrival 02 sats  99% on RA    HEENT: Only a few lower teeth remain/ nl oropharynx. Nl external ear canals without cough reflex -  Mild bilateral non-specific turbinate edema     NECK :  without JVD/Nodes/TM/ nl carotid upstrokes bilaterally   LUNGS: no acc muscle use,  Mild barrel  contour chest wall with bilateral  Mild insp/exp rhonchi  and  without cough on insp or exp maneuver and mild  Hyperresonant  to  percussion bilaterally     CV:  RRR  no s3 or murmur or increase in P2, and no edema   ABD:  soft and nontender with pos late insp Hoover's  in the supine position. No bruits or organomegaly appreciated, bowel sounds nl  MS:   Nl gait/  ext warm without deformities, calf tenderness, cyanosis or clubbing No obvious joint restrictions   SKIN: warm and dry without lesions    NEURO:  alert, approp, nl sensorium with  no motor or cerebellar deficits apparent.             CXR PA and Lateral:   03/05/2018 :    I personally reviewed images and agree with radiology impression as follows:   Small residual opacity at the RIGHT lung base, including a small nodular density which is most likely nodular scarring/atelectasis related to the previous RIGHT lower lobe pneumonia.  My review:  Marked serial improvement in aeration in the RLL       Assessment

## 2018-03-05 NOTE — Telephone Encounter (Signed)
Call Report from [facility] at Radiology:  "Small residual opacity at right lung base with small nodular density most likely scarring and atelectic related to previous RLL pneumonia. Given emphysemataous changes from CT done 12/23/2017 this indicates a high risk for lung cancer. F/U Chest CT is recommended."  MW please advise.

## 2018-03-06 ENCOUNTER — Encounter: Payer: Self-pay | Admitting: Internal Medicine

## 2018-03-06 LAB — ACID FAST CULTURE WITH REFLEXED SENSITIVITIES (MYCOBACTERIA)

## 2018-03-06 LAB — ACID FAST CULTURE WITH REFLEXED SENSITIVITIES: ACID FAST CULTURE - AFSCU3: NEGATIVE

## 2018-03-06 NOTE — Assessment & Plan Note (Signed)

## 2018-03-06 NOTE — Assessment & Plan Note (Signed)
Onset spring 2019 assoc with documented aspiration on DgEs 08/2017 in pt with poor cough mechanics  - cxr changes 12/11/17 no better 01/08/2018 with "mucus plugs" by CT chest 12/23/17 in active smoker - REC fob 12/2417 > copious retained mucus> tbbx c/w organizing pna/ neg cyt/ neg gm stain / few pseudomonas sensitive to cipro on culture so rec on 01/19/18 start  750 bid x 10 days then ov in 2 weeks with cxr> marked improvement on cxr 03/05/2018   C/w aspiration injury/ residual minimal scar and main concern is ongoing issue with oropharangeal dysphagia ? Related to gerd so rec max rx for gerd then consider ent eval next esp since pt active smoker and main compliants now relate more to throat than lungs and GI eval could be considered next.  Discussed in detail all the  indications, usual  risks and alternatives  relative to the benefits with patient who agrees to proceed with w/u as outlined.

## 2018-03-09 ENCOUNTER — Other Ambulatory Visit: Payer: Self-pay | Admitting: Family Medicine

## 2018-03-09 MED ORDER — HYDROCODONE-ACETAMINOPHEN 5-325 MG PO TABS: 1.0000 | ORAL_TABLET | Freq: Four times a day (QID) | ORAL | 0 refills | Status: DC | PRN

## 2018-03-09 NOTE — Telephone Encounter (Signed)
Requesting refill    Hydrocodone  LOV: 01/20/18  LRF:   02/05/18

## 2018-03-09 NOTE — Progress Notes (Signed)
HPI: FU coronary artery disease, status post coronary bypassing graft. This was performed in 1998. His last catheterization in October 2008 showed a patent LIMA to the LAD and a patent saphenous vein graft to the right coronary artery. However, he did have significant left main disease that bifurcated into the circumflex. He had a drug-eluting stent to his left main at that time. Abdominal ultrasound in August of 2011 showed no aneurysm. He also has a history of SVT and atrial flutter ablation. Nuclear study October 2018 showed ejection fraction 52% and no ischemia or infarction.  Carotid Dopplers February 2019 showed 40 to 59% right and left stenosis.  Since I last saw him  he denies dyspnea, chest pain, palpitations or syncope.  He is appropriately grieving for recent loss of his wife.  Current Outpatient Medications  Medication Sig Dispense Refill  . albuterol (PROVENTIL) (2.5 MG/3ML) 0.083% nebulizer solution Take 3 mLs (2.5 mg total) by nebulization every 6 (six) hours as needed for wheezing or shortness of breath. (Patient taking differently: Take 2.5 mg by nebulization 2 (two) times daily. ) 75 mL 12  . aspirin 325 MG tablet Take 325 mg by mouth daily.     . COMBIVENT RESPIMAT 20-100 MCG/ACT AERS respimat INHALE 1 PUFF INTO THE LUNGS EVERY 6 (SIX) HOURS. 1 Inhaler 11  . ferrous sulfate 325 (65 FE) MG tablet Take 1 tablet (325 mg total) by mouth daily with breakfast.  3  . fluocinonide ointment (LIDEX) 9.67 % Apply 1 application topically 2 (two) times daily. 30 g 0  . guaiFENesin (MUCINEX) 600 MG 12 hr tablet Take 600 mg by mouth 2 (two) times daily.    Marland Kitchen HYDROcodone-acetaminophen (NORCO) 5-325 MG tablet Take 1 tablet by mouth every 6 (six) hours as needed for moderate pain. 30 tablet 0  . insulin NPH-regular Human (NOVOLIN 70/30) (70-30) 100 UNIT/ML injection Inject 8 Units into the skin 2 (two) times daily with a meal. (Patient taking differently: Inject 8-12 Units into the skin See  admin instructions. Inject 8 units SQ in the morning and inject 12 units SQ at bedtime) 10 mL 3  . meloxicam (MOBIC) 7.5 MG tablet Take 1 tablet (7.5 mg total) by mouth daily. 30 tablet 2  . Menthol, Topical Analgesic, (ICY HOT EX) Apply 1 application topically daily.    . metoprolol succinate (TOPROL-XL) 25 MG 24 hr tablet Take 1 tablet (25 mg total) by mouth daily. (Patient taking differently: Take 12.5 mg by mouth daily. ) 30 tablet 3  . mirtazapine (REMERON) 30 MG tablet Take 1 tablet (30 mg total) by mouth at bedtime. 30 tablet 2  . nitroGLYCERIN (NITROSTAT) 0.4 MG SL tablet Place 0.4 mg under the tongue every 5 (five) minutes as needed for chest pain.    Glory Rosebush DELICA LANCETS 89F MISC USE TO CHECK BLOOD SUAGR 3 TIMES A DAY DX:E11.9 100 each 5  . pantoprazole (PROTONIX) 40 MG tablet Take 30- 60 min before your first and last meals of the day 180 tablet 3  . PARoxetine (PAXIL) 20 MG tablet TAKE 1 TABLET BY MOUTH EVERY DAY 90 tablet 2  . Polyvinyl Alcohol (LIQUID TEARS OP) Place 1 drop into both eyes 2 (two) times daily as needed (for dry eyes).    . pravastatin (PRAVACHOL) 40 MG tablet TAKE 1 TABLET BY MOUTH EVERY DAY 90 tablet 2  . tamsulosin (FLOMAX) 0.4 MG CAPS capsule TAKE 1 CAPSULE BY MOUTH DAILY AFTER SUPPER. 90 capsule 3  No current facility-administered medications for this visit.      Past Medical History:  Diagnosis Date  . Acute venous embolism and thrombosis of unspecified deep vessels of lower extremity   . Atrial flutter (HCC)    and SVT that is post ablation  . CAD (coronary artery disease)   . Chronic kidney disease    stones  . Coronary atherosclerosis 11/19/2008   Qualifier: Diagnosis of  By: Stanford Breed, MD, Kandyce Rud   . Dyslipidemia   . GERD (gastroesophageal reflux disease)   . HTN (hypertension)   . Shortness of breath    on exertion    Past Surgical History:  Procedure Laterality Date  . CARDIAC CATHETERIZATION     s/p cardiac  catheterization and bypass surgery as well as EGD with dilation x3, left lower extremity vein stripping  . CORONARY ARTERY BYPASS GRAFT    . ESOPHAGOGASTRODUODENOSCOPY N/A 12/06/2013   Procedure: ESOPHAGOGASTRODUODENOSCOPY (EGD);  Surgeon: Lear Ng, MD;  Location: [facility];  Service: Endoscopy;  Laterality: N/A;  . left knee anthroscopy    . SECONDARY CLOSURE ARM    . VIDEO BRONCHOSCOPY Bilateral 01/14/2018   Procedure: VIDEO BRONCHOSCOPY WITHOUT FLUORO;  Surgeon: Tanda Rockers, MD;  Location: [facility];  Service: Cardiopulmonary;  Laterality: Bilateral;    Social History   Socioeconomic History  . Marital status: Widowed    Spouse name: Not on file  . Number of children: Not on file  . Years of education: Not on file  . Highest education level: Not on file  Occupational History  . Not on file  Social Needs  . Financial resource strain: Not on file  . Food insecurity:    Worry: Not on file    Inability: Not on file  . Transportation needs:    Medical: Not on file    Non-medical: Not on file  Tobacco Use  . Smoking status: Current Every Day Smoker    Packs/day: 1.00    Years: 60.00    Pack years: 60.00    Types: Cigarettes  . Smokeless tobacco: Never Used  Substance and Sexual Activity  . Alcohol use: No  . Drug use: No  . Sexual activity: Not on file  Lifestyle  . Physical activity:    Days per week: Not on file    Minutes per session: Not on file  . Stress: Not on file  Relationships  . Social connections:    Talks on phone: Not on file    Gets together: Not on file    Attends religious service: Not on file    Active member of club or organization: Not on file    Attends meetings of clubs or organizations: Not on file    Relationship status: Not on file  . Intimate partner violence:    Fear of current or ex partner: Not on file    Emotionally abused: Not on file    Physically abused: Not on file    Forced sexual activity: Not on file  Other  Topics Concern  . 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     History reviewed. No pertinent family history.  ROS: no fevers or chills, productive cough, hemoptysis, dysphasia, odynophagia, melena, hematochezia, dysuria, hematuria, rash, seizure activity, orthopnea, PND, pedal edema, claudication. Remaining systems are negative.  Physical Exam: Well-developed well-nourished  in no acute distress.  Skin is warm and dry.  HEENT is normal.  Neck is supple.  Chest diminished breath sounds throughout. Cardiovascular exam is regular rate and rhythm.  Abdominal exam nontender or distended. No masses palpated. Extremities show no edema. neuro grossly intact  A/P  1 coronary artery disease status post coronary artery bypass graft-continue aspirin (decrease to 81 mg daily); resume statin.  2 hypertension-patient's blood pressure is controlled.  Continue present medications and follow.  3 hyperlipidemia-resume pravastatin 40 mg daily.  Check lipids and liver in 4 weeks.  As outlined in previous notes he has not tolerated high-dose statins previously.  4 carotid artery disease-follow-up carotid Dopplers Feb 2020.  5 tobacco abuse-patient counseled on discontinuing.  6 history of atrial flutter ablation-patient remains in sinus rhythm.  Kirk Ruths, MD

## 2018-03-09 NOTE — Telephone Encounter (Signed)
Patient is calling to get refill on his hydrocodone  [facility]

## 2018-03-12 DIAGNOSIS — R69 Illness, unspecified: Secondary | ICD-10-CM | POA: Diagnosis not present

## 2018-03-17 ENCOUNTER — Ambulatory Visit: Payer: Medicare HMO | Admitting: Cardiology

## 2018-03-17 ENCOUNTER — Encounter: Payer: Self-pay | Admitting: Cardiology

## 2018-03-17 VITALS — BP 130/58 | HR 72 | Ht 74.0 in | Wt 143.0 lb

## 2018-03-17 DIAGNOSIS — I10 Essential (primary) hypertension: Secondary | ICD-10-CM

## 2018-03-17 DIAGNOSIS — I2510 Atherosclerotic heart disease of native coronary artery without angina pectoris: Secondary | ICD-10-CM | POA: Diagnosis not present

## 2018-03-17 DIAGNOSIS — I679 Cerebrovascular disease, unspecified: Secondary | ICD-10-CM | POA: Diagnosis not present

## 2018-03-17 DIAGNOSIS — E785 Hyperlipidemia, unspecified: Secondary | ICD-10-CM | POA: Diagnosis not present

## 2018-03-17 MED ORDER — ASPIRIN 81 MG PO TABS: 81.0000 mg | ORAL_TABLET | Freq: Every day | ORAL | Status: DC

## 2018-03-17 MED ORDER — PRAVASTATIN SODIUM 40 MG PO TABS: 40.0000 mg | ORAL_TABLET | Freq: Every day | ORAL | 3 refills | Status: DC

## 2018-03-17 NOTE — Patient Instructions (Signed)
Medication Instructions:   DECREASE ASPIRIN TO 81 MG ONCE DAILY  START PRAVASTATIN 40 MG ONCE DAILY  Labwork:  Your physician recommends that you return for lab work in: [place]:  Your physician wants you to follow-up in: [place] will receive a reminder letter in the mail two months in advance. If you don't receive a letter, please call our office to schedule the follow-up appointment.   If you need a refill on your cardiac medications before your next appointment, please call your pharmacy.

## 2018-03-19 DIAGNOSIS — H02132 Senile ectropion of right lower eyelid: Secondary | ICD-10-CM | POA: Diagnosis not present

## 2018-03-19 DIAGNOSIS — H02135 Senile ectropion of left lower eyelid: Secondary | ICD-10-CM | POA: Diagnosis not present

## 2018-03-19 DIAGNOSIS — Z9841 Cataract extraction status, right eye: Secondary | ICD-10-CM | POA: Diagnosis not present

## 2018-03-19 DIAGNOSIS — Z9842 Cataract extraction status, left eye: Secondary | ICD-10-CM | POA: Diagnosis not present

## 2018-03-19 DIAGNOSIS — H16213 Exposure keratoconjunctivitis, bilateral: Secondary | ICD-10-CM | POA: Diagnosis not present

## 2018-03-19 DIAGNOSIS — H2011 Chronic iridocyclitis, right eye: Secondary | ICD-10-CM | POA: Diagnosis not present

## 2018-03-19 DIAGNOSIS — H5989 Other postprocedural complications and disorders of eye and adnexa, not elsewhere classified: Secondary | ICD-10-CM | POA: Diagnosis not present

## 2018-03-24 DIAGNOSIS — R69 Illness, unspecified: Secondary | ICD-10-CM | POA: Diagnosis not present

## 2018-03-24 LAB — CBC WITH DIFFERENTIAL/PLATELET
Basophils Absolute: 66 cells/uL (ref 0–200)
Basophils Relative: 0.4 %
EOS ABS: 297 {cells}/uL (ref 15–500)
Eosinophils Relative: 1.8 %
HCT: 31.8 % — ABNORMAL LOW (ref 38.5–50.0)
Hemoglobin: 10.8 g/dL — ABNORMAL LOW (ref 13.2–17.1)
Lymphs Abs: 1568 cells/uL (ref 850–3900)
MCH: 28.5 pg (ref 27.0–33.0)
MCHC: 34 g/dL (ref 32.0–36.0)
MCV: 83.9 fL (ref 80.0–100.0)
MPV: 10.2 fL (ref 7.5–12.5)
Monocytes Relative: 11.1 %
NEUTROS PCT: 77.2 %
Neutro Abs: 12738 cells/uL — ABNORMAL HIGH (ref 1500–7800)
PLATELETS: 321 10*3/uL (ref 140–400)
RBC: 3.79 10*6/uL — ABNORMAL LOW (ref 4.20–5.80)
RDW: 14.2 % (ref 11.0–15.0)
TOTAL LYMPHOCYTE: 9.5 %
WBC: 16.5 10*3/uL — AB (ref 3.8–10.8)
WBCMIX: 1832 {cells}/uL — AB (ref 200–950)

## 2018-03-24 LAB — COMPLETE METABOLIC PANEL WITH GFR
AG Ratio: 0.8 (calc) — ABNORMAL LOW (ref 1.0–2.5)
ALKALINE PHOSPHATASE (APISO): 51 U/L (ref 40–115)
ALT: 8 U/L — AB (ref 9–46)
AST: 14 U/L (ref 10–35)
Albumin: 3.2 g/dL — ABNORMAL LOW (ref 3.6–5.1)
BUN/Creatinine Ratio: 21 (calc) (ref 6–22)
BUN: 27 mg/dL — ABNORMAL HIGH (ref 7–25)
CALCIUM: 8.7 mg/dL (ref 8.6–10.3)
CO2: 27 mmol/L (ref 20–32)
CREATININE: 1.31 mg/dL — AB (ref 0.70–1.11)
Chloride: 103 mmol/L (ref 98–110)
GFR, EST NON AFRICAN AMERICAN: 51 mL/min/{1.73_m2} — AB (ref >=60)
GFR, Est African American: 59 mL/min/{1.73_m2} — ABNORMAL LOW (ref >=60)
GLOBULIN: 3.8 g/dL — AB (ref 1.9–3.7)
GLUCOSE: 157 mg/dL — AB (ref 65–99)
POTASSIUM: 4.7 mmol/L (ref 3.5–5.3)
SODIUM: 137 mmol/L (ref 135–146)
Total Bilirubin: 0.5 mg/dL (ref 0.2–1.2)
Total Protein: 7 g/dL (ref 6.1–8.1)

## 2018-03-24 LAB — SEDIMENTATION RATE: SED RATE: 70 mm/h — AB (ref 0–20)

## 2018-04-08 ENCOUNTER — Other Ambulatory Visit: Payer: Self-pay | Admitting: Family Medicine

## 2018-04-08 NOTE — Telephone Encounter (Signed)
Patient is requesting a refill on Hydrocodone   LOV: 12/24/17  LRF:  03/09/18

## 2018-04-08 NOTE — Telephone Encounter (Signed)
Patient is calling to get refill on his hydrocodone  To be sent to [facility]

## 2018-04-09 MED ORDER — HYDROCODONE-ACETAMINOPHEN 5-325 MG PO TABS: 1.0000 | ORAL_TABLET | Freq: Four times a day (QID) | ORAL | 0 refills | Status: DC | PRN

## 2018-04-15 ENCOUNTER — Other Ambulatory Visit: Payer: Self-pay | Admitting: Family Medicine

## 2018-04-15 ENCOUNTER — Telehealth: Payer: Self-pay | Admitting: Family Medicine

## 2018-04-15 MED ORDER — TRIAMCINOLONE ACETONIDE 0.1 % EX CREA: 1.0000 "application " | TOPICAL_CREAM | Freq: Two times a day (BID) | CUTANEOUS | 0 refills | Status: DC

## 2018-04-15 NOTE — Telephone Encounter (Signed)
Medication called/sent to requested pharmacy  

## 2018-04-15 NOTE — Telephone Encounter (Signed)
Med refill on triamcinolone actonide cream to [facility]

## 2018-04-17 ENCOUNTER — Other Ambulatory Visit: Payer: Self-pay | Admitting: Family Medicine

## 2018-04-20 DIAGNOSIS — R69 Illness, unspecified: Secondary | ICD-10-CM | POA: Diagnosis not present

## 2018-04-23 ENCOUNTER — Telehealth: Payer: Self-pay | Admitting: Family Medicine

## 2018-04-23 NOTE — Telephone Encounter (Signed)
Mail order pharmacy called and needs [place] to call and verify prescription information.

## 2018-04-24 NOTE — Telephone Encounter (Signed)
Pharmacy wanted to confirm patients current medications for insurance requirements.

## 2018-05-07 DIAGNOSIS — E785 Hyperlipidemia, unspecified: Secondary | ICD-10-CM | POA: Diagnosis not present

## 2018-05-07 LAB — LIPID PANEL
CHOLESTEROL TOTAL: 114 mg/dL (ref 100–199)
Chol/HDL Ratio: 2.9 ratio (ref 0.0–5.0)
HDL: 40 mg/dL (ref >39)
LDL Calculated: 61 mg/dL (ref 0–99)
Triglycerides: 67 mg/dL (ref 0–149)
VLDL CHOLESTEROL CAL: 13 mg/dL (ref 5–40)

## 2018-05-07 LAB — HEPATIC FUNCTION PANEL
ALBUMIN: 3.7 g/dL (ref 3.5–4.7)
ALT: 9 IU/L (ref 0–44)
AST: 13 IU/L (ref 0–40)
Alkaline Phosphatase: 68 IU/L (ref 39–117)
BILIRUBIN TOTAL: 0.4 mg/dL (ref 0.0–1.2)
BILIRUBIN, DIRECT: 0.12 mg/dL (ref 0.00–0.40)
TOTAL PROTEIN: 7.4 g/dL (ref 6.0–8.5)

## 2018-05-13 DIAGNOSIS — R69 Illness, unspecified: Secondary | ICD-10-CM | POA: Diagnosis not present

## 2018-06-03 ENCOUNTER — Encounter: Payer: Self-pay | Admitting: Internal Medicine

## 2018-06-03 ENCOUNTER — Ambulatory Visit: Payer: Medicare HMO | Admitting: Internal Medicine

## 2018-06-03 VITALS — BP 94/50 | HR 88 | Ht 74.0 in | Wt 138.2 lb

## 2018-06-03 DIAGNOSIS — F17210 Nicotine dependence, cigarettes, uncomplicated: Secondary | ICD-10-CM | POA: Diagnosis not present

## 2018-06-03 DIAGNOSIS — J449 Chronic obstructive pulmonary disease, unspecified: Secondary | ICD-10-CM

## 2018-06-03 DIAGNOSIS — R69 Illness, unspecified: Secondary | ICD-10-CM | POA: Diagnosis not present

## 2018-06-03 DIAGNOSIS — R918 Other nonspecific abnormal finding of lung field: Secondary | ICD-10-CM | POA: Diagnosis not present

## 2018-06-03 NOTE — Patient Instructions (Signed)
Always take your active medications with you to your doctors   Pulmonary follow up is as needed

## 2018-06-03 NOTE — Progress Notes (Addendum)
[name], male    DOB: 10-07-1934, 83 y.o.   MRN: [id number]    Brief patient profile:  43 yowm active smoker with new onset sob/cough x 2014 worse since April 2019  Assoc with chronic dysphagia with documented mild silent aspiration by [facility] 08/29/17 and not able to swallow barium tablet.  Referred 01/08/2018 by Dr Dennard Schaumann for eval infiltrates on cxr RLL ant basal segment first noted 12/11/17 with suggestion of mucus plugging by CT chest 12/24/17    History of Present Illness  01/08/2018 1st pulmonary office eval/ [name] Chief Complaint  Patient presents with  . Pulmonary Consult    Referred by Dr. Dennard Schaumann. Pt states he has had recurrent PNA since April 2019. He states he always feels SOB with or without exertion. He is using albuterol nebs 2 x daily.    Dyspnea: uphill when walks back from mailbox= MMRC1 = can walk nl pace, flat grade, can't hurry or go uphills or steps s sob    Cough: rattling / worse since April  2019 some better with abx transiently each time  / min mucoid sputum esp in am  Sleep: sleeps prone SABA use: not sure they help  Chronic dysphagia, lost about 10 lb since onset of coughing April 2019  Already eval by ST and Gdaughter says he's been following instructions but not helping swallowing rec The key is to stop smoking completely before smoking completely stops you!  Best cough medication mucinex 1200 mg every 12 hours as needed      02/02/18 NP rec Repeat chest xray in 3 weeks  FU visit in 1 month with Dr. Melvyn Novas Continue liquid mucinex Add Ensure shakes twice a day     03/05/2018  f/u ov/[name] re: cough /dysphagia  Chief Complaint  Patient presents with  . Follow-up    Breathing has improved. He has occ cough with white sputum. He has not needed his neb or combivent inhaler.  Dyspnea:  MMRC1 = can walk nl pace, flat grade, can't hurry or go uphills or steps s sob  Improved mb to house  Cough: better / some white in am/ still occ choking on food  Sleeping:   Lies flat/ one pillow SABA use: none used - has combivent not using  02: no  rec Increase pantoprazole to 40 mg Take 30- 60 min before your first and last meals of the day  GERDrx  If throat not some better after 4 weeks on this plan then call me for referral to ENT        06/03/2018  f/u ov/[name] re: chronic cough/ sob /  Chief Complaint  Patient presents with  . Follow-up    Pt states "I stay SOB all the time". He has not used his combivent or neb recently.    Dyspnea:  Able to mb and back s stopping, slow pace at grocery store and yet feels continually sob at rest sitting (not apparent  at ov)  Cough: cough  Is rattling/ congested worse in am and sometimes p meals  Sleeping: lie flat in bed ok  SABA use: none 02: none    No obvious day to day or daytime variability or assoc  purulent sputum or mucus plugs or hemoptysis or cp or chest tightness, subjective wheeze or overt sinus or hb symptoms.   Sleeping as above  without nocturnal   am exacerbation  of respiratory  c/o's or need for noct saba. Also denies any obvious fluctuation of symptoms  with weather or environmental changes or other aggravating or alleviating factors except as outlined above   No unusual exposure hx or h/o childhood pna/ asthma or knowledge of premature birth.  Current Allergies, Complete Past Medical History, Past Surgical History, Family History, and Social History were reviewed in [employer] record.  ROS  The following are not active complaints unless bolded Hoarseness, sore throat, dysphagia, dental problems, itching, sneezing,  nasal congestion or discharge of excess mucus or purulent secretions, ear ache,   fever, chills, sweats, unintended wt loss or wt gain, classically pleuritic or exertional cp,  orthopnea pnd or arm/hand swelling  or leg swelling, presyncope, palpitations, abdominal pain, anorexia, nausea, vomiting, diarrhea  or change in bowel habits or change in bladder  habits, change in stools or change in urine, dysuria, hematuria,  rash, arthralgias, visual complaints, headache, numbness, weakness or ataxia or problems with walking or coordination,  change in mood or  memory.        Current Meds  Medication Sig  . albuterol (PROVENTIL) (2.5 MG/3ML) 0.083% nebulizer solution Take 3 mLs (2.5 mg total) by nebulization every 6 (six) hours as needed for wheezing or shortness of breath. (Patient taking differently: Take 2.5 mg by nebulization 2 (two) times daily. )  . aspirin 81 MG tablet Take 1 tablet (81 mg total) by mouth daily.  . COMBIVENT RESPIMAT 20-100 MCG/ACT AERS respimat INHALE 1 PUFF INTO THE LUNGS EVERY 6 (SIX) HOURS.  . nitroGLYCERIN (NITROSTAT) 0.4 MG SL tablet Place 0.4 mg under the tongue every 5 (five) minutes as needed for chest pain.  . pantoprazole (PROTONIX) 40 MG tablet Take 30- 60 min before your first and last meals of the day                Objective:      amb elderly wm does not appear to be open to recommendations re meds or smoking or diet   06/03/2018     138   03/05/2018       143   01/08/18 138 lb (62.6 kg)  12/23/17 144 lb (65.3 kg)  12/16/17 142 lb (64.4 kg)     Vital signs reviewed - Note on arrival 02 sats  100% on RA  And not bp 94/50 though no orthostatic symptoms     HEENT: Poor dentition/ nl oropharynx. Nl external ear canals without cough reflex -  Mild bilateral non-specific turbinate edema     NECK :  without JVD/Nodes/TM/ nl carotid upstrokes bilaterally   LUNGS: no acc muscle use,  Mild barrel  contour chest wall with bilateral  Distant bs s audible wheeze and  without cough on insp or exp maneuver and mild  Hyperresonant  to  percussion bilaterally     CV:  RRR  no s3 or murmur or increase in P2, and no edema   ABD:  soft and nontender with pos end  insp Hoover's  in the supine position. No bruits or organomegaly appreciated, bowel sounds nl  MS:   Nl gait/  ext warm without deformities, calf  tenderness, cyanosis or clubbing No obvious joint restrictions   SKIN: warm and dry without lesions    NEURO:  alert, approp, nl sensorium with  no motor or cerebellar deficits apparent.                 Assessment

## 2018-06-04 ENCOUNTER — Ambulatory Visit: Payer: Medicare HMO | Admitting: Internal Medicine

## 2018-06-07 ENCOUNTER — Encounter: Payer: Self-pay | Admitting: Internal Medicine

## 2018-06-07 DIAGNOSIS — J449 Chronic obstructive pulmonary disease, unspecified: Secondary | ICD-10-CM | POA: Insufficient documentation

## 2018-06-07 NOTE — Assessment & Plan Note (Addendum)
Spirometry 06/03/2018  FEV1 2.6 (77%)  Ratio 72 with atypical curvature - 06/03/2018   Walked RA  2 laps @ 275ft each @ slow to avg pace  stopped due to end of study, minimal sob    Surprisingly does not meet the criteria for copd but has  Emphysematous changes on CT so best characterized as GOLD 0 at risk    I reviewed the Fletcher curve with the patient that basically indicates  if you quit smoking when your best day FEV1 is still well preserved (as is clearly  the case here)  it is highly unlikely you will progress to severe disease and informed the patient there was  no medication on the market that has proven to alter the curve/ its downward trajectory  or the likelihood of progression of their disease(unlike other chronic medical conditions such as atheroclerosis where we do think we can change the natural hx with risk reducing meds)    Therefore stopping smoking and maintaining abstinence are  the most important aspects of care, not choice of inhalers or for that matter, doctors.   Treatment other than smoking cessation  is entirely directed by severity of symptoms and focused also on reducing exacerbations, not attempting to change the natural history of the disease.      >>>> Since not likely he is having limiting sob from copd with an FEV1 > 2 liters and exac are due to aspiration/ smoking I rec he work on these issues and just use saba prn     I had an extended discussion with the patient  /fm  reviewing all relevant studies completed to date and  lasting 15 to 20 minutes of a 25 minute visit  which included directly observing ambulatory 02 saturation study documented in a/p section of  today's  office note.  Each maintenance medication was reviewed in detail including most importantly the difference between maintenance and prns and under what circumstances the prns are to be triggered using an action plan format that is not reflected in the computer generated alphabetically organized  AVS.     Please see AVS for specific instructions unique to this visit that I personally wrote and verbalized to the the pt in detail and then reviewed with pt  by my nurse highlighting any changes in therapy recommended at today's visit .

## 2018-06-07 NOTE — Assessment & Plan Note (Signed)
Counseled re importance of smoking cessation but did not meet time criteria for separate billing   °

## 2018-07-16 DIAGNOSIS — R69 Illness, unspecified: Secondary | ICD-10-CM | POA: Diagnosis not present

## 2018-07-19 DIAGNOSIS — R69 Illness, unspecified: Secondary | ICD-10-CM | POA: Diagnosis not present

## 2018-07-24 ENCOUNTER — Ambulatory Visit (HOSPITAL_COMMUNITY): Payer: Medicare HMO

## 2018-08-03 ENCOUNTER — Ambulatory Visit (INDEPENDENT_AMBULATORY_CARE_PROVIDER_SITE_OTHER): Payer: Medicare HMO | Admitting: Family Medicine

## 2018-08-03 ENCOUNTER — Encounter: Payer: Self-pay | Admitting: Family Medicine

## 2018-08-03 VITALS — BP 144/66 | HR 80 | Temp 97.6°F | Resp 18 | Ht 74.0 in | Wt 146.0 lb

## 2018-08-03 DIAGNOSIS — E1140 Type 2 diabetes mellitus with diabetic neuropathy, unspecified: Secondary | ICD-10-CM

## 2018-08-03 DIAGNOSIS — Z794 Long term (current) use of insulin: Secondary | ICD-10-CM | POA: Diagnosis not present

## 2018-08-03 MED ORDER — HYDROXYZINE HCL 25 MG PO TABS: 25.0000 mg | ORAL_TABLET | Freq: Three times a day (TID) | ORAL | 0 refills | Status: DC | PRN

## 2018-08-03 NOTE — Progress Notes (Signed)
Subjective:    Patient ID: [name], male    DOB: Mar 25, 1935, 84 y.o.   MRN: [id number]  HPI  Patient reports itching all over his body.  He reports itching on the back of his neck.  There is no visible rash.  However there are numerous areas where he is scratched and excoriated the skin on the back of his neck.  He reports itching on both arms.  There are excoriations on his arms but no visible rash.  There is dry skin throughout.  There are no papules or vesicles or macules.  He also reports itching on the posterior aspects of both calves.  Again there is no visible rash other than dry skin and excoriation and irritation from itching and scratching.  Therefore I believe that this is neuropathic itching likely due to neuropathy.  Patient has not been seen since July of last year.  He states that his blood sugars been relatively well controlled.  He states that his sugars are generally between 100-200.  He denies any hypoglycemic episodes.  He denies any chest pain or shortness of breath.  He does complain of feeling weak and tired.  He is aching all over his body.  He has to take 1-2 pain pills a day due to the pain in his neck and shoulders. Past Medical History:  Diagnosis Date  . Acute venous embolism and thrombosis of unspecified deep vessels of lower extremity   . Atrial flutter (HCC)    and SVT that is post ablation  . CAD (coronary artery disease)   . Chronic kidney disease    stones  . Coronary atherosclerosis 11/19/2008   Qualifier: Diagnosis of  By: Stanford Breed, MD, Kandyce Rud   . Dyslipidemia   . GERD (gastroesophageal reflux disease)   . HTN (hypertension)   . Shortness of breath    on exertion   Past Surgical History:  Procedure Laterality Date  . CARDIAC CATHETERIZATION     s/p cardiac catheterization and bypass surgery as well as EGD with dilation x3, left lower extremity vein stripping  . CORONARY ARTERY BYPASS GRAFT    . ESOPHAGOGASTRODUODENOSCOPY N/A  12/06/2013   Procedure: ESOPHAGOGASTRODUODENOSCOPY (EGD);  Surgeon: Lear Ng, MD;  Location: [facility];  Service: Endoscopy;  Laterality: N/A;  . left knee anthroscopy    . SECONDARY CLOSURE ARM    . VIDEO BRONCHOSCOPY Bilateral 01/14/2018   Procedure: VIDEO BRONCHOSCOPY WITHOUT FLUORO;  Surgeon: Tanda Rockers, MD;  Location: [facility];  Service: Cardiopulmonary;  Laterality: Bilateral;   Current Outpatient Medications on File Prior to Visit  Medication Sig Dispense Refill  . albuterol (PROVENTIL) (2.5 MG/3ML) 0.083% nebulizer solution Take 3 mLs (2.5 mg total) by nebulization every 6 (six) hours as needed for wheezing or shortness of breath. (Patient taking differently: Take 2.5 mg by nebulization 2 (two) times daily. ) 75 mL 12  . aspirin 81 MG tablet Take 1 tablet (81 mg total) by mouth daily.    . COMBIVENT RESPIMAT 20-100 MCG/ACT AERS respimat INHALE 1 PUFF INTO THE LUNGS EVERY 6 (SIX) HOURS. 1 Inhaler 11  . ferrous sulfate 325 (65 FE) MG tablet Take 1 tablet (325 mg total) by mouth daily with breakfast.  3  . fluocinonide ointment (LIDEX) 2.67 % Apply 1 application topically 2 (two) times daily. 30 g 0  . guaiFENesin (MUCINEX) 600 MG 12 hr tablet Take 600 mg by mouth 2 (two) times daily.    Marland Kitchen HYDROcodone-acetaminophen (NORCO) 5-325  MG tablet Take 1 tablet by mouth every 6 (six) hours as needed for moderate pain. 30 tablet 0  . insulin NPH-regular Human (NOVOLIN 70/30) (70-30) 100 UNIT/ML injection Inject 8-12 Units into the skin See admin instructions. Inject 8 units SQ in the morning and inject 12 units SQ at bedtime 10 mL 11  . meloxicam (MOBIC) 7.5 MG tablet Take 1 tablet (7.5 mg total) by mouth daily. 30 tablet 2  . Menthol, Topical Analgesic, (ICY HOT EX) Apply 1 application topically daily.    . metoprolol succinate (TOPROL-XL) 25 MG 24 hr tablet Take 1 tablet (25 mg total) by mouth daily. (Patient taking differently: Take 12.5 mg by mouth daily. ) 30 tablet 3  .  mirtazapine (REMERON) 30 MG tablet TAKE 1 TABLET BY MOUTH AT BEDTIME. 30 tablet 2  . nitroGLYCERIN (NITROSTAT) 0.4 MG SL tablet Place 0.4 mg under the tongue every 5 (five) minutes as needed for chest pain.    . pantoprazole (PROTONIX) 40 MG tablet Take 30- 60 min before your first and last meals of the day 180 tablet 3  . PARoxetine (PAXIL) 20 MG tablet TAKE 1 TABLET BY MOUTH EVERY DAY 90 tablet 2  . Polyvinyl Alcohol (LIQUID TEARS OP) Place 1 drop into both eyes 2 (two) times daily as needed (for dry eyes).    . pravastatin (PRAVACHOL) 40 MG tablet Take 1 tablet (40 mg total) by mouth daily. 90 tablet 3  . tamsulosin (FLOMAX) 0.4 MG CAPS capsule TAKE 1 CAPSULE BY MOUTH DAILY AFTER SUPPER. 90 capsule 3  . triamcinolone cream (KENALOG) 0.1 % Apply 1 application topically 2 (two) times daily. 454 g 0   No current facility-administered medications on file prior to visit.    Allergies  Allergen Reactions  . Celebrex [Celecoxib] Rash and Other (See Comments)    Blister   Social History   Socioeconomic History  . Marital status: Widowed    Spouse name: Not on file  . Number of children: Not on file  . Years of education: Not on file  . Highest education level: Not on file  Occupational History  . Not on file  Social Needs  . Financial resource strain: Not on file  . Food insecurity:    Worry: Not on file    Inability: Not on file  . Transportation needs:    Medical: Not on file    Non-medical: Not on file  Tobacco Use  . Smoking status: Current Every Day Smoker    Packs/day: 1.00    Years: 60.00    Pack years: 60.00    Types: Cigarettes  . Smokeless tobacco: Never Used  Substance and Sexual Activity  . Alcohol use: No  . Drug use: No  . Sexual activity: Not on file  Lifestyle  . Physical activity:    Days per week: Not on file    Minutes per session: Not on file  . Stress: Not on file  Relationships  . Social connections:    Talks on phone: Not on file    Gets  together: Not on file    Attends religious service: Not on file    Active member of club or organization: Not on file    Attends meetings of clubs or organizations: Not on file    Relationship status: Not on file  . Intimate partner violence:    Fear of current or ex partner: Not on file    Emotionally abused: Not on file    Physically  abused: Not on file    Forced sexual activity: Not on file  Other Topics Concern  . 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       Review of Systems  All other systems reviewed and are negative.      Objective:   Physical Exam  Constitutional: He appears well-developed and well-nourished. No distress.  Neck: Spinous process tenderness and muscular tenderness present.  Cardiovascular: Normal rate, regular rhythm and normal heart sounds. Exam reveals no gallop and no friction rub.  No murmur heard. Pulmonary/Chest: Effort normal. No respiratory distress. He has no wheezes. He has no rales. He exhibits no tenderness.  Skin: No rash noted. He is not diaphoretic. No erythema.  Vitals reviewed.         Assessment & Plan:  Type 2 diabetes mellitus with diabetic neuropathy, with long-term current use of insulin (HCC) - Plan: Hemoglobin A1c, CBC with Differential/Platelet, COMPLETE METABOLIC PANEL WITH GFR   I believe his itching is a combination of atopic dermatitis/eczema and likely neurogenic itching/paresthesias.  We have tried several corticosteroid ointments for this in the past with no success.  Therefore I will start the patient on hydroxyzine 25 mg every 8 hours as needed for itching.  I cautioned the patient about hypersomnolence on this medication.  Monitor for dizziness or falls.  Also recommended he use moisturizer such as Vaseline on his neck and other surfaces that itch due to the dry skin.  While the  patient is here I will check lab work regarding his diabetes including a CBC, CMP, and A1c.  I will also check these to rule out other sources of pruritus including polycythemia, uremia, and liver dysfunction

## 2018-08-04 LAB — CBC WITH DIFFERENTIAL/PLATELET
ABSOLUTE MONOCYTES: 741 {cells}/uL (ref 200–950)
BASOS ABS: 61 {cells}/uL (ref 0–200)
Basophils Relative: 0.9 %
EOS ABS: 408 {cells}/uL (ref 15–500)
Eosinophils Relative: 6 %
HCT: 35.5 % — ABNORMAL LOW (ref 38.5–50.0)
HEMOGLOBIN: 11.6 g/dL — AB (ref 13.2–17.1)
Lymphs Abs: 1530 cells/uL (ref 850–3900)
MCH: 29.1 pg (ref 27.0–33.0)
MCHC: 32.7 g/dL (ref 32.0–36.0)
MCV: 89 fL (ref 80.0–100.0)
MONOS PCT: 10.9 %
MPV: 10.1 fL (ref 7.5–12.5)
Neutro Abs: 4060 cells/uL (ref 1500–7800)
Neutrophils Relative %: 59.7 %
Platelets: 215 10*3/uL (ref 140–400)
RBC: 3.99 10*6/uL — ABNORMAL LOW (ref 4.20–5.80)
RDW: 15 % (ref 11.0–15.0)
Total Lymphocyte: 22.5 %
WBC: 6.8 10*3/uL (ref 3.8–10.8)

## 2018-08-04 LAB — COMPLETE METABOLIC PANEL WITH GFR
AG Ratio: 1 (calc) (ref 1.0–2.5)
ALKALINE PHOSPHATASE (APISO): 54 U/L (ref 35–144)
ALT: 7 U/L — AB (ref 9–46)
AST: 15 U/L (ref 10–35)
Albumin: 3.8 g/dL (ref 3.6–5.1)
BUN/Creatinine Ratio: 19 (calc) (ref 6–22)
BUN: 24 mg/dL (ref 7–25)
CHLORIDE: 107 mmol/L (ref 98–110)
CO2: 25 mmol/L (ref 20–32)
Calcium: 9.2 mg/dL (ref 8.6–10.3)
Creat: 1.28 mg/dL — ABNORMAL HIGH (ref 0.70–1.11)
GFR, Est African American: 60 mL/min/{1.73_m2} (ref >=60)
GFR, Est Non African American: 52 mL/min/{1.73_m2} — ABNORMAL LOW (ref >=60)
GLUCOSE: 63 mg/dL — AB (ref 65–99)
Globulin: 3.9 g/dL (calc) — ABNORMAL HIGH (ref 1.9–3.7)
Potassium: 4.7 mmol/L (ref 3.5–5.3)
Sodium: 141 mmol/L (ref 135–146)
TOTAL PROTEIN: 7.7 g/dL (ref 6.1–8.1)
Total Bilirubin: 0.6 mg/dL (ref 0.2–1.2)

## 2018-08-04 LAB — HEMOGLOBIN A1C
EAG (MMOL/L): 6.5 (calc)
HEMOGLOBIN A1C: 5.7 %{Hb} — AB (ref <5.7)
Mean Plasma Glucose: 117 (calc)

## 2018-08-06 ENCOUNTER — Telehealth: Payer: Self-pay | Admitting: Family Medicine

## 2018-08-06 MED ORDER — HYDROXYZINE HCL 25 MG PO TABS: 25.0000 mg | ORAL_TABLET | Freq: Three times a day (TID) | ORAL | 0 refills | Status: DC | PRN

## 2018-08-06 NOTE — Telephone Encounter (Signed)
PA Case: [id number], Status: Approved, Coverage Starts on: 06/22/2018, Coverage Ends on: 06/24/2019. Questions? Contact [phone].   Pharm made aware

## 2018-08-06 NOTE — Telephone Encounter (Signed)
PA Submitted through [email or web address] and received the following:  [employer] has not yet replied to your PA request. You may close this dialog, return to your dashboard, and perform other tasks.  To check for an update later, open this request again from your dashboard.  If [employer] has not replied to your request within 24 hours please contact [employer] at [phone].

## 2018-08-17 ENCOUNTER — Ambulatory Visit (HOSPITAL_COMMUNITY)
Admission: RE | Admit: 2018-08-17 | Discharge: 2018-08-17 | Disposition: A | Discharge status: Home / Self Care | Payer: Medicare HMO | Source: Ambulatory Visit | Attending: Cardiovascular Disease | Admitting: Cardiovascular Disease

## 2018-08-17 DIAGNOSIS — I679 Cerebrovascular disease, unspecified: Secondary | ICD-10-CM | POA: Diagnosis not present

## 2018-08-19 ENCOUNTER — Other Ambulatory Visit: Payer: Self-pay | Admitting: *Deleted

## 2018-08-19 DIAGNOSIS — I679 Cerebrovascular disease, unspecified: Secondary | ICD-10-CM

## 2018-08-27 ENCOUNTER — Other Ambulatory Visit: Payer: Self-pay | Admitting: Family Medicine

## 2018-08-27 ENCOUNTER — Ambulatory Visit (INDEPENDENT_AMBULATORY_CARE_PROVIDER_SITE_OTHER): Payer: Medicare HMO | Admitting: Family Medicine

## 2018-08-27 ENCOUNTER — Encounter: Payer: Self-pay | Admitting: Family Medicine

## 2018-08-27 VITALS — BP 130/56 | HR 78 | Temp 97.8°F | Resp 20 | Ht 74.0 in | Wt 146.0 lb

## 2018-08-27 DIAGNOSIS — N481 Balanitis: Secondary | ICD-10-CM

## 2018-08-27 DIAGNOSIS — L309 Dermatitis, unspecified: Secondary | ICD-10-CM | POA: Diagnosis not present

## 2018-08-27 MED ORDER — CLOTRIMAZOLE 1 % EX CREA: 1.0000 "application " | TOPICAL_CREAM | Freq: Two times a day (BID) | CUTANEOUS | 0 refills | Status: DC

## 2018-08-27 MED ORDER — CEPHALEXIN 500 MG PO CAPS: 500.0000 mg | ORAL_CAPSULE | Freq: Four times a day (QID) | ORAL | 0 refills | Status: DC

## 2018-08-27 MED ORDER — CLOBETASOL PROP EMOLLIENT BASE 0.05 % EX CREA: 1.0000 "application " | TOPICAL_CREAM | Freq: Two times a day (BID) | CUTANEOUS | 1 refills | Status: DC

## 2018-08-27 NOTE — Progress Notes (Signed)
Subjective:    Patient ID: [name], male    DOB: Oct 11, 1934, 84 y.o.   MRN: [id number]  HPI  08/03/18 Patient reports itching all over his body.  He reports itching on the back of his neck.  There is no visible rash.  However there are numerous areas where he is scratched and excoriated the skin on the back of his neck.  He reports itching on both arms.  There are excoriations on his arms but no visible rash.  There is dry skin throughout.  There are no papules or vesicles or macules.  He also reports itching on the posterior aspects of both calves.  Again there is no visible rash other than dry skin and excoriation and irritation from itching and scratching.  Therefore I believe that this is neuropathic itching likely due to neuropathy.  Patient has not been seen since July of last year.  He states that his blood sugars been relatively well controlled.  He states that his sugars are generally between 100-200.  He denies any hypoglycemic episodes.  He denies any chest pain or shortness of breath.  He does complain of feeling weak and tired.  He is aching all over his body.  He has to take 1-2 pain pills a day due to the pain in his neck and shoulders.  At that time, my plan was: I believe his itching is a combination of atopic dermatitis/eczema and likely neurogenic itching/paresthesias.  We have tried several corticosteroid ointments for this in the past with no success.  Therefore I will start the patient on hydroxyzine 25 mg every 8 hours as needed for itching.  I cautioned the patient about hypersomnolence on this medication.  Monitor for dizziness or falls.  Also recommended he use moisturizer such as Vaseline on his neck and other surfaces that itch due to the dry skin.  While the patient is here I will check lab work regarding his diabetes including a CBC, CMP, and A1c.  I will also check these to rule out other sources of pruritus including polycythemia, uremia, and liver  dysfunction  08/27/18 Patient presents today complaining of a rash on the head of his penis.  He states is been there for less than a week.  On examination, the glans of the penis is bright erythematous red hot and painful.  The patient clearly has balanitis.  There is also some slight erythema to the foreskin with a trace white exudate behind the glans of the penis and underneath the foreskin.  I suspect that the patient had Candida balanitis that has subsequently led to a bacterial infection.  Patient also continues to report itching all over his body however the itching in his legs has improved.  Now the itching is primarily confined to his upper back. Past Medical History:  Diagnosis Date  . Acute venous embolism and thrombosis of unspecified deep vessels of lower extremity   . Atrial flutter (HCC)    and SVT that is post ablation  . CAD (coronary artery disease)   . Chronic kidney disease    stones  . Coronary atherosclerosis 11/19/2008   Qualifier: Diagnosis of  By: Stanford Breed, MD, Kandyce Rud   . Dyslipidemia   . GERD (gastroesophageal reflux disease)   . HTN (hypertension)   . Shortness of breath    on exertion   Past Surgical History:  Procedure Laterality Date  . CARDIAC CATHETERIZATION     s/p cardiac catheterization and bypass surgery as  well as EGD with dilation x3, left lower extremity vein stripping  . CORONARY ARTERY BYPASS GRAFT    . ESOPHAGOGASTRODUODENOSCOPY N/A 12/06/2013   Procedure: ESOPHAGOGASTRODUODENOSCOPY (EGD);  Surgeon: Lear Ng, MD;  Location: [facility];  Service: Endoscopy;  Laterality: N/A;  . left knee anthroscopy    . SECONDARY CLOSURE ARM    . VIDEO BRONCHOSCOPY Bilateral 01/14/2018   Procedure: VIDEO BRONCHOSCOPY WITHOUT FLUORO;  Surgeon: Tanda Rockers, MD;  Location: [facility];  Service: Cardiopulmonary;  Laterality: Bilateral;   Current Outpatient Medications on File Prior to Visit  Medication Sig Dispense Refill  . aspirin  81 MG tablet Take 1 tablet (81 mg total) by mouth daily.    Marland Kitchen HYDROcodone-acetaminophen (NORCO) 5-325 MG tablet Take 1 tablet by mouth every 6 (six) hours as needed for moderate pain. 30 tablet 0  . hydrOXYzine (ATARAX/VISTARIL) 25 MG tablet Take 1 tablet (25 mg total) by mouth 3 (three) times daily as needed for itching. 30 tablet 0  . insulin NPH-regular Human (NOVOLIN 70/30) (70-30) 100 UNIT/ML injection Inject 8-12 Units into the skin See admin instructions. Inject 8 units SQ in the morning and inject 12 units SQ at bedtime 10 mL 11  . nitroGLYCERIN (NITROSTAT) 0.4 MG SL tablet Place 0.4 mg under the tongue every 5 (five) minutes as needed for chest pain.    . pantoprazole (PROTONIX) 40 MG tablet Take 30- 60 min before your first and last meals of the day 180 tablet 3  . pravastatin (PRAVACHOL) 40 MG tablet Take 1 tablet (40 mg total) by mouth daily. 90 tablet 3  . tamsulosin (FLOMAX) 0.4 MG CAPS capsule TAKE 1 CAPSULE BY MOUTH DAILY AFTER SUPPER. 90 capsule 3   No current facility-administered medications on file prior to visit.    Allergies  Allergen Reactions  . Celebrex [Celecoxib] Rash and Other (See Comments)    Blister   Social History   Socioeconomic History  . Marital status: Widowed    Spouse name: Not on file  . Number of children: Not on file  . Years of education: Not on file  . Highest education level: Not on file  Occupational History  . Not on file  Social Needs  . Financial resource strain: Not on file  . Food insecurity:    Worry: Not on file    Inability: Not on file  . Transportation needs:    Medical: Not on file    Non-medical: Not on file  Tobacco Use  . Smoking status: Current Every Day Smoker    Packs/day: 1.00    Years: 60.00    Pack years: 60.00    Types: Cigarettes  . Smokeless tobacco: Never Used  Substance and Sexual Activity  . Alcohol use: No  . Drug use: No  . Sexual activity: Not on file  Lifestyle  . Physical activity:    Days per  week: Not on file    Minutes per session: Not on file  . Stress: Not on file  Relationships  . Social connections:    Talks on phone: Not on file    Gets together: Not on file    Attends religious service: Not on file    Active member of club or organization: Not on file    Attends meetings of clubs or organizations: Not on file    Relationship status: Not on file  . Intimate partner violence:    Fear of current or ex partner: Not on file  Emotionally abused: Not on file    Physically abused: Not on file    Forced sexual activity: Not on file  Other Topics Concern  . 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       Review of Systems  All other systems reviewed and are negative.      Objective:   Physical Exam  Constitutional: He appears well-developed and well-nourished. No distress.  Cardiovascular: Normal rate, regular rhythm and normal heart sounds. Exam reveals no gallop and no friction rub.  No murmur heard. Pulmonary/Chest: Effort normal. No respiratory distress. He has no wheezes. He has no rales. He exhibits no tenderness.  Genitourinary:    Penile erythema and penile tenderness present.  Skin: No rash noted. He is not diaphoretic.  Vitals reviewed.         Assessment & Plan:  Balanitis  Eczema, unspecified type  I believe the patient originally had Candida balanitis that has subsequently turned into a secondary bacterial infection.  Begin Lotrimin cream applied to the head of the penis twice daily to cover for any fungal infection and also as an emollient.  Add Keflex 500 mg 4 times daily for 1 week to cover for cellulitis and reassess the patient next week if no better or sooner if worse.  Treat the chronic dry skin and itching and eczema in his upper back with clobetasol cream with an emollient base.  He can apply this  twice daily as needed for itching.

## 2018-09-16 ENCOUNTER — Other Ambulatory Visit: Payer: Self-pay | Admitting: Family Medicine

## 2018-09-16 DIAGNOSIS — R69 Illness, unspecified: Secondary | ICD-10-CM | POA: Diagnosis not present

## 2018-09-17 DIAGNOSIS — R69 Illness, unspecified: Secondary | ICD-10-CM | POA: Diagnosis not present

## 2018-09-28 ENCOUNTER — Other Ambulatory Visit: Payer: Self-pay | Admitting: Family Medicine

## 2018-09-28 MED ORDER — HYDROCODONE-ACETAMINOPHEN 5-325 MG PO TABS: 1.0000 | ORAL_TABLET | Freq: Four times a day (QID) | ORAL | 0 refills | Status: DC | PRN

## 2018-09-28 NOTE — Telephone Encounter (Signed)
Refill on hydrocodone to [address]

## 2018-09-28 NOTE — Telephone Encounter (Signed)
Patient is requesting a refill on Hydrocodone   LOV: 08/27/18  LRF:   04/09/18

## 2018-10-13 DIAGNOSIS — R69 Illness, unspecified: Secondary | ICD-10-CM | POA: Diagnosis not present

## 2018-10-22 ENCOUNTER — Other Ambulatory Visit: Payer: Self-pay | Admitting: Family Medicine

## 2018-10-23 ENCOUNTER — Telehealth: Payer: Self-pay | Admitting: Cardiology

## 2018-10-23 NOTE — Telephone Encounter (Signed)
Left message for patient to call and schedule 6 mos telehealth visit with Dr. Stanford Breed

## 2018-10-24 DIAGNOSIS — R69 Illness, unspecified: Secondary | ICD-10-CM | POA: Diagnosis not present

## 2018-10-26 ENCOUNTER — Telehealth (INDEPENDENT_AMBULATORY_CARE_PROVIDER_SITE_OTHER): Payer: Medicare HMO | Admitting: Cardiology

## 2018-10-26 ENCOUNTER — Encounter: Payer: Self-pay | Admitting: Cardiology

## 2018-10-26 ENCOUNTER — Encounter: Payer: Self-pay | Admitting: *Deleted

## 2018-10-26 VITALS — BP 121/64 | HR 70 | Ht 74.0 in | Wt 148.0 lb

## 2018-10-26 DIAGNOSIS — R072 Precordial pain: Secondary | ICD-10-CM | POA: Diagnosis not present

## 2018-10-26 DIAGNOSIS — I2510 Atherosclerotic heart disease of native coronary artery without angina pectoris: Secondary | ICD-10-CM

## 2018-10-26 DIAGNOSIS — E7800 Pure hypercholesterolemia, unspecified: Secondary | ICD-10-CM

## 2018-10-26 DIAGNOSIS — I10 Essential (primary) hypertension: Secondary | ICD-10-CM

## 2018-10-26 NOTE — Patient Instructions (Signed)
Medication Instructions:  NO CHANGE If you need a refill on your cardiac medications before your next appointment, please call your pharmacy.   Lab work: If you have labs (blood work) drawn today and your tests are completely normal, you will receive your results only by: Marland Kitchen MyChart Message (if you have MyChart) OR . A paper copy in the mail If you have any lab test that is abnormal or we need to change your treatment, we will call you to review the results.  Testing/Procedures: Your physician has requested that you have a lexiscan myoview. For further information please visit [email or web address]. Please follow instruction sheet, as given. Allison    Follow-Up: At [facility], you and your health needs are our priority.  As part of our continuing mission to provide you with exceptional heart care, we have created designated Provider Care Teams.  These Care Teams include your primary Cardiologist (physician) and Advanced Practice Providers (APPs -  Physician Assistants and Nurse Practitioners) who all work together to provide you with the care you need, when you need it. You will need a follow up appointment in 6 months.  Please call our office 2 months in advance to schedule this appointment.  You may see Kirk Ruths MD or one of the following Advanced Practice Providers on your designated Care Team:   Kerin Ransom, PA-C Roby Lofts, [place] . Sande Rives, PA-C

## 2018-10-26 NOTE — Telephone Encounter (Signed)
[name] patient's granddaughter is returning nurse's call.

## 2018-10-26 NOTE — Progress Notes (Signed)
Virtual Visit via Video Note   This visit type was conducted due to national recommendations for restrictions regarding the COVID-19 Pandemic (e.g. social distancing) in an effort to limit this patient's exposure and mitigate transmission in our community.  Due to his co-morbid illnesses, this patient is at least at moderate risk for complications without adequate follow up.  This format is felt to be most appropriate for this patient at this time.  All issues noted in this document were discussed and addressed.  A limited physical exam was performed with this format.  Please refer to the patient's chart for his consent to telehealth for [facility].   Date:  10/26/2018   ID:  [name], DOB 1935/04/19, MRN [id number]  Patient Location: Home Provider Location: Home  PCP:  Susy Frizzle, MD  Cardiologist:  Dr Stanford Breed  Evaluation Performed:  Follow-Up Visit  Chief Complaint:  FU CAD  History of Present Illness:    FU coronary artery disease, status post coronary bypassing graft. This was performed in 1998. His last catheterization in October 2008 showed a patent LIMA to the LAD and a patent saphenous vein graft to the right coronary artery. However, he did have significant left main disease that bifurcated into the circumflex. He had a drug-eluting stent to his left main at that time. Abdominal ultrasound in August of 2011 showed no aneurysm. He also has a history of SVT and atrial flutter ablation.Nuclear study October 2018 showed ejection fraction 52% and no ischemia or infarction.  Carotid Dopplers February 2020 showed 40 to 59% right and left stenosis.  Since I last saw himhe occasionally has dyspnea on exertion.  He also has occasional chest pain.  He has had difficulties with this in the past.  Can last up to 5 minutes at a time.  Can occur both at exertion and at rest.  No syncope.  The patient does not have symptoms concerning for COVID-19 infection (fever, chills,  cough, or new shortness of breath).    Past Medical History:  Diagnosis Date  . Acute venous embolism and thrombosis of unspecified deep vessels of lower extremity   . Atrial flutter (HCC)    and SVT that is post ablation  . CAD (coronary artery disease)   . Chronic kidney disease    stones  . Coronary atherosclerosis 11/19/2008   Qualifier: Diagnosis of  By: Stanford Breed, MD, Kandyce Rud   . Dyslipidemia   . GERD (gastroesophageal reflux disease)   . HTN (hypertension)   . Shortness of breath    on exertion   Past Surgical History:  Procedure Laterality Date  . CARDIAC CATHETERIZATION     s/p cardiac catheterization and bypass surgery as well as EGD with dilation x3, left lower extremity vein stripping  . CORONARY ARTERY BYPASS GRAFT    . ESOPHAGOGASTRODUODENOSCOPY N/A 12/06/2013   Procedure: ESOPHAGOGASTRODUODENOSCOPY (EGD);  Surgeon: Lear Ng, MD;  Location: [facility];  Service: Endoscopy;  Laterality: N/A;  . left knee anthroscopy    . SECONDARY CLOSURE ARM    . VIDEO BRONCHOSCOPY Bilateral 01/14/2018   Procedure: VIDEO BRONCHOSCOPY WITHOUT FLUORO;  Surgeon: Tanda Rockers, MD;  Location: [facility];  Service: Cardiopulmonary;  Laterality: Bilateral;     Current Meds  Medication Sig  . aspirin 81 MG tablet Take 1 tablet (81 mg total) by mouth daily.  . BD INSULIN SYRINGE U/F 31G X 5/16" 0.3 ML MISC USE WITH NOVOLIN TWICE DAY WITH A MEAL  .  cephALEXin (KEFLEX) 500 MG capsule Take 1 capsule (500 mg total) by mouth 4 (four) times daily.  . clobetasol cream (TEMOVATE) 0.05 % Please specify directions, refills and quantity  . clotrimazole (LOTRIMIN) 1 % cream Apply 1 application topically 2 (two) times daily. Apply to the head of the penis  . glucose blood (ONE TOUCH ULTRA TEST) test strip USE TO CHECK BLOOD SUGAR 3 TO 6 TIMES DAILY. E11.9  . HYDROcodone-acetaminophen (NORCO) 5-325 MG tablet Take 1 tablet by mouth every 6 (six) hours as needed for moderate  pain.  . hydrOXYzine (ATARAX/VISTARIL) 25 MG tablet TAKE 1 TABLET BY MOUTH 3 TIMES DAILY AS NEEDED FOR ITCHING.  . insulin NPH-regular Human (NOVOLIN 70/30) (70-30) 100 UNIT/ML injection Inject 8-12 Units into the skin See admin instructions. Inject 8 units SQ in the morning and inject 12 units SQ at bedtime  . nitroGLYCERIN (NITROSTAT) 0.4 MG SL tablet Place 0.4 mg under the tongue every 5 (five) minutes as needed for chest pain.  . pantoprazole (PROTONIX) 40 MG tablet Take 30- 60 min before your first and last meals of the day  . pravastatin (PRAVACHOL) 40 MG tablet Take 1 tablet (40 mg total) by mouth daily.  . tamsulosin (FLOMAX) 0.4 MG CAPS capsule TAKE 1 CAPSULE BY MOUTH DAILY AFTER SUPPER.     Allergies:   Celebrex [celecoxib]   Social History   Tobacco Use  . Smoking status: Current Every Day Smoker    Packs/day: 1.00    Years: 60.00    Pack years: 60.00    Types: Cigarettes  . Smokeless tobacco: Never Used  Substance Use Topics  . Alcohol use: No  . Drug use: No     Family Hx: The patient's family history is not on file.  ROS:   Please see the history of present illness.    No fevers, chills or productive cough. All other systems reviewed and are negative.   Recent Labs: 10/31/2017: TSH 2.68 08/03/2018: ALT 7; BUN 24; Creat 1.28; Hemoglobin 11.6; Platelets 215; Potassium 4.7; Sodium 141   Recent Lipid Panel Lab Results  Component Value Date/Time   CHOL 114 05/07/2018 08:26 AM   TRIG 67 05/07/2018 08:26 AM   HDL 40 05/07/2018 08:26 AM   CHOLHDL 2.9 05/07/2018 08:26 AM   CHOLHDL 4.0 01/16/2016 08:41 AM   LDLCALC 61 05/07/2018 08:26 AM   LDLDIRECT 168.0 02/07/2010 12:00 AM    Wt Readings from Last 3 Encounters:  08/27/18 146 lb (66.2 kg)  08/03/18 146 lb (66.2 kg)  06/03/18 138 lb 3.2 oz (62.7 kg)     Objective:    Vital Signs:  There were no vitals taken for this visit.   VITAL SIGNS:  reviewed  No acute distress Normal affect Answers questions  appropriately Remainder of physical examination not performed (telehealth visit; coronavirus pandemic)  ASSESSMENT & PLAN:    1. Coronary artery disease status post coronary artery bypass and graft-plan to continue aspirin and statin.  2. hypertension-patient's blood pressure is well controlled today.  Continue present medications and follow. 3. Hyperlipidemia-plan to continue statin at present dose.  He has not tolerated high-dose statins previously.  Check lipids and liver.  If LDL greater than 80 would consider Repatha or Zetia. 4. Tobacco abuse-patient counseled on discontinuing. 5. Prior atrial flutter ablation-no recurrences by history. 6. Carotid artery disease-plan follow-up carotid Dopplers February 2021.  COVID-19 Education: The importance of social distancing was discussed today.  Time:   Today, I have spent 15 minutes  with the patient with telehealth technology discussing the above problems.     Medication Adjustments/Labs and Tests Ordered: Current medicines are reviewed at length with the patient today.  Concerns regarding medicines are outlined above.   Tests Ordered: No orders of the defined types were placed in this encounter.   Medication Changes: No orders of the defined types were placed in this encounter.   Disposition:  Follow up in 6 month(s)  Signed, Kirk Ruths, MD  10/26/2018 1:00 PM    [place]

## 2018-10-26 NOTE — Telephone Encounter (Signed)
This encounter was created in error - please disregard.

## 2018-11-09 ENCOUNTER — Other Ambulatory Visit: Payer: Self-pay | Admitting: Family Medicine

## 2018-11-09 ENCOUNTER — Telehealth: Payer: Self-pay | Admitting: Family Medicine

## 2018-11-09 DIAGNOSIS — R69 Illness, unspecified: Secondary | ICD-10-CM | POA: Diagnosis not present

## 2018-11-09 MED ORDER — HYDROCODONE-ACETAMINOPHEN 5-325 MG PO TABS: 1.0000 | ORAL_TABLET | Freq: Four times a day (QID) | ORAL | 0 refills | Status: DC | PRN

## 2018-11-09 NOTE — Telephone Encounter (Signed)
Patient also needs refill on cephalexin

## 2018-11-09 NOTE — Telephone Encounter (Signed)
Patient is requesting a refill on Hydrocodone    LOV: 08/27/18  LRF:   09/28/18

## 2018-11-09 NOTE — Telephone Encounter (Signed)
Patient needs refill on hydrocodone  [facility]

## 2018-11-09 NOTE — Telephone Encounter (Signed)
Called and spoke to granddaughter and he does not need the antibx refilled as far as she knows he is not having any issues. Informed her we would refill the pain med but not the antibx.

## 2018-11-23 ENCOUNTER — Telehealth: Payer: Self-pay | Admitting: Family Medicine

## 2018-11-23 MED ORDER — TRIAMCINOLONE ACETONIDE 0.1 % EX CREA: 1.0000 "application " | TOPICAL_CREAM | Freq: Two times a day (BID) | CUTANEOUS | 0 refills | Status: DC

## 2018-11-23 NOTE — Telephone Encounter (Signed)
Medication called/sent to requested pharmacy  

## 2018-11-23 NOTE — Telephone Encounter (Signed)
REFILL ON TRIAMCINOLONE CREAM TO [facility] [address].

## 2018-11-24 DIAGNOSIS — H353131 Nonexudative age-related macular degeneration, bilateral, early dry stage: Secondary | ICD-10-CM | POA: Diagnosis not present

## 2018-11-24 DIAGNOSIS — H02132 Senile ectropion of right lower eyelid: Secondary | ICD-10-CM | POA: Diagnosis not present

## 2018-11-24 DIAGNOSIS — E119 Type 2 diabetes mellitus without complications: Secondary | ICD-10-CM | POA: Diagnosis not present

## 2018-11-24 DIAGNOSIS — Z9842 Cataract extraction status, left eye: Secondary | ICD-10-CM | POA: Diagnosis not present

## 2018-11-24 DIAGNOSIS — H52213 Irregular astigmatism, bilateral: Secondary | ICD-10-CM | POA: Diagnosis not present

## 2018-11-24 DIAGNOSIS — Z9841 Cataract extraction status, right eye: Secondary | ICD-10-CM | POA: Diagnosis not present

## 2018-11-24 DIAGNOSIS — H02135 Senile ectropion of left lower eyelid: Secondary | ICD-10-CM | POA: Diagnosis not present

## 2018-11-24 LAB — HM DIABETES EYE EXAM

## 2018-11-25 ENCOUNTER — Other Ambulatory Visit: Payer: Self-pay | Admitting: Family Medicine

## 2018-12-02 ENCOUNTER — Telehealth: Payer: Self-pay | Admitting: Family Medicine

## 2018-12-02 DIAGNOSIS — E785 Hyperlipidemia, unspecified: Secondary | ICD-10-CM

## 2018-12-02 DIAGNOSIS — R69 Illness, unspecified: Secondary | ICD-10-CM | POA: Diagnosis not present

## 2018-12-02 MED ORDER — PRAVASTATIN SODIUM 40 MG PO TABS: 40.0000 mg | ORAL_TABLET | Freq: Every day | ORAL | 3 refills | Status: DC

## 2018-12-02 MED ORDER — PANTOPRAZOLE SODIUM 40 MG PO TBEC: DELAYED_RELEASE_TABLET | ORAL | 3 refills | Status: DC

## 2018-12-02 NOTE — Telephone Encounter (Signed)
Medication called/sent to requested pharmacy  

## 2018-12-02 NOTE — Telephone Encounter (Signed)
Refill on pravastatin and pantoprazole to [facility]

## 2018-12-04 ENCOUNTER — Other Ambulatory Visit: Payer: Self-pay | Admitting: Family Medicine

## 2018-12-04 MED ORDER — HYDROCODONE-ACETAMINOPHEN 5-325 MG PO TABS: 1.0000 | ORAL_TABLET | Freq: Four times a day (QID) | ORAL | 0 refills | Status: DC | PRN

## 2018-12-04 NOTE — Telephone Encounter (Signed)
Ok to refill??  Last office visit 08/27/2018.  Last refill 11/09/2018.

## 2018-12-04 NOTE — Telephone Encounter (Signed)
Patient needs refill on hydrocodone  [facility]

## 2018-12-08 ENCOUNTER — Telehealth (HOSPITAL_COMMUNITY): Payer: Self-pay | Admitting: *Deleted

## 2018-12-08 NOTE — Telephone Encounter (Signed)
Patient given detailed instructions per Myocardial Perfusion Study Information Sheet for the test on 12/10/18 at 10:45. Patient notified to arrive 15 minutes early and that it is imperative to arrive on time for appointment to keep from having the test rescheduled.  If you need to cancel or reschedule your appointment, please call the office within 24 hours of your appointment. . Patient verbalized understanding.[name]

## 2018-12-10 ENCOUNTER — Encounter (HOSPITAL_COMMUNITY): Payer: Self-pay

## 2018-12-10 ENCOUNTER — Ambulatory Visit (HOSPITAL_COMMUNITY): Payer: Medicare HMO | Attending: Cardiology

## 2018-12-10 ENCOUNTER — Other Ambulatory Visit: Payer: Self-pay

## 2018-12-10 DIAGNOSIS — R072 Precordial pain: Secondary | ICD-10-CM | POA: Diagnosis not present

## 2018-12-10 LAB — MYOCARDIAL PERFUSION IMAGING
LV dias vol: 70 mL (ref 62–150)
LV sys vol: 28 mL
Peak HR: 89 {beats}/min
Rest HR: 72 {beats}/min
SDS: 1
SRS: 0
SSS: 1
TID: 1.13

## 2018-12-10 MED ORDER — TECHNETIUM TC 99M TETROFOSMIN IV KIT
31.6000 | PACK | Freq: Once | INTRAVENOUS | Status: AC | PRN
  Administered 2018-12-10: 31.6 via INTRAVENOUS
  Filled 2018-12-10: qty 32

## 2018-12-10 MED ORDER — TECHNETIUM TC 99M TETROFOSMIN IV KIT
9.9000 | PACK | Freq: Once | INTRAVENOUS | Status: AC | PRN
  Administered 2018-12-10: 9.9 via INTRAVENOUS
  Filled 2018-12-10: qty 10

## 2018-12-10 MED ORDER — REGADENOSON 0.4 MG/5ML IV SOLN
0.4000 mg | Freq: Once | INTRAVENOUS | Status: AC
  Administered 2018-12-10: 0.4 mg via INTRAVENOUS

## 2018-12-25 DIAGNOSIS — R69 Illness, unspecified: Secondary | ICD-10-CM | POA: Diagnosis not present

## 2019-01-06 ENCOUNTER — Telehealth: Payer: Self-pay | Admitting: Family Medicine

## 2019-01-06 MED ORDER — TRIAMCINOLONE ACETONIDE 0.1 % EX CREA: 1.0000 "application " | TOPICAL_CREAM | Freq: Two times a day (BID) | CUTANEOUS | 0 refills | Status: DC

## 2019-01-06 NOTE — Telephone Encounter (Signed)
Patient is requesting a refill on Hydrocodone   LOV: 08/27/18  LRF: 12/04/18

## 2019-01-06 NOTE — Telephone Encounter (Signed)
Patient needs refill on hydrocodone and triamcinolone  [facility]

## 2019-01-07 MED ORDER — HYDROCODONE-ACETAMINOPHEN 5-325 MG PO TABS: 1.0000 | ORAL_TABLET | Freq: Four times a day (QID) | ORAL | 0 refills | Status: DC | PRN

## 2019-01-11 ENCOUNTER — Other Ambulatory Visit: Payer: Self-pay | Admitting: Family Medicine

## 2019-01-11 MED ORDER — HYDROCODONE-ACETAMINOPHEN 5-325 MG PO TABS: 1.0000 | ORAL_TABLET | Freq: Four times a day (QID) | ORAL | 0 refills | Status: DC | PRN

## 2019-01-11 NOTE — Telephone Encounter (Signed)
This was ok's on 01/07/19 - please resend pharm did not get.

## 2019-01-11 NOTE — Telephone Encounter (Signed)
Patient calling to get refill on hydrocodone  [facility]

## 2019-01-17 ENCOUNTER — Other Ambulatory Visit: Payer: Self-pay | Admitting: Family Medicine

## 2019-01-18 DIAGNOSIS — R69 Illness, unspecified: Secondary | ICD-10-CM | POA: Diagnosis not present

## 2019-01-27 ENCOUNTER — Other Ambulatory Visit: Payer: Self-pay | Admitting: Family Medicine

## 2019-01-27 MED ORDER — TAMSULOSIN HCL 0.4 MG PO CAPS: ORAL_CAPSULE | ORAL | 3 refills | Status: DC

## 2019-02-01 ENCOUNTER — Other Ambulatory Visit: Payer: Self-pay | Admitting: Family Medicine

## 2019-02-01 MED ORDER — HYDROCODONE-ACETAMINOPHEN 5-325 MG PO TABS: 1.0000 | ORAL_TABLET | Freq: Four times a day (QID) | ORAL | 0 refills | Status: DC | PRN

## 2019-02-01 MED ORDER — HYDROXYZINE HCL 25 MG PO TABS: ORAL_TABLET | ORAL | 0 refills | Status: DC

## 2019-02-01 NOTE — Telephone Encounter (Signed)
Patient needs refill on tamsulosin hydrocodone and hydroxyzine and triamcinolone  [facility]

## 2019-02-01 NOTE — Telephone Encounter (Signed)
Requested Prescriptions   Pending Prescriptions Disp Refills  . HYDROcodone-acetaminophen (NORCO) 5-325 MG tablet 30 tablet 0    Sig: Take 1 tablet by mouth every 6 (six) hours as needed for moderate pain.   Signed Prescriptions Disp Refills  . hydrOXYzine (ATARAX/VISTARIL) 25 MG tablet 30 tablet 0    Sig: TAKE 1 TABLET BY MOUTH 3 TIMES DAILY AS NEEDED FOR ITCHING.    Authorizing Provider: Susy Frizzle    Ordering User: Vanice Sarah    Last OV 08/27/2018  Last written 01/11/2019

## 2019-02-04 ENCOUNTER — Other Ambulatory Visit: Payer: Self-pay

## 2019-02-04 ENCOUNTER — Encounter: Payer: Self-pay | Admitting: Family Medicine

## 2019-02-04 ENCOUNTER — Ambulatory Visit (INDEPENDENT_AMBULATORY_CARE_PROVIDER_SITE_OTHER): Payer: Medicare HMO | Admitting: Family Medicine

## 2019-02-04 ENCOUNTER — Ambulatory Visit
Admission: RE | Admit: 2019-02-04 | Discharge: 2019-02-04 | Disposition: A | Discharge status: Home / Self Care | Payer: Medicare HMO | Source: Ambulatory Visit | Attending: Family Medicine | Admitting: Family Medicine

## 2019-02-04 VITALS — BP 110/52 | HR 80 | Temp 98.8°F | Resp 18 | Ht 74.0 in | Wt 138.0 lb

## 2019-02-04 DIAGNOSIS — E1140 Type 2 diabetes mellitus with diabetic neuropathy, unspecified: Secondary | ICD-10-CM | POA: Diagnosis not present

## 2019-02-04 DIAGNOSIS — E7800 Pure hypercholesterolemia, unspecified: Secondary | ICD-10-CM | POA: Diagnosis not present

## 2019-02-04 DIAGNOSIS — R509 Fever, unspecified: Secondary | ICD-10-CM

## 2019-02-04 DIAGNOSIS — R1030 Lower abdominal pain, unspecified: Secondary | ICD-10-CM | POA: Diagnosis not present

## 2019-02-04 DIAGNOSIS — R059 Cough, unspecified: Secondary | ICD-10-CM

## 2019-02-04 DIAGNOSIS — R05 Cough: Secondary | ICD-10-CM

## 2019-02-04 DIAGNOSIS — Z20822 Contact with and (suspected) exposure to covid-19: Secondary | ICD-10-CM

## 2019-02-04 DIAGNOSIS — Z794 Long term (current) use of insulin: Secondary | ICD-10-CM

## 2019-02-04 LAB — URINALYSIS, ROUTINE W REFLEX MICROSCOPIC
Bacteria, UA: NONE SEEN /HPF
Bilirubin Urine: NEGATIVE
Glucose, UA: NEGATIVE
Hyaline Cast: NONE SEEN /LPF
Ketones, ur: NEGATIVE
Leukocytes,Ua: NEGATIVE
Nitrite: NEGATIVE
Specific Gravity, Urine: 1.02 (ref 1.001–1.03)
Squamous Epithelial / HPF: NONE SEEN /HPF (ref <=5)
pH: 5.5 (ref 5.0–8.0)

## 2019-02-04 LAB — MICROSCOPIC MESSAGE

## 2019-02-04 MED ORDER — LEVOFLOXACIN 500 MG PO TABS: 500.0000 mg | ORAL_TABLET | Freq: Every day | ORAL | 0 refills | Status: DC

## 2019-02-04 NOTE — Progress Notes (Signed)
Subjective:    Patient ID: [name], male    DOB: 08/19/1934, 84 y.o.   MRN: [id number]  HPI  Patient presents today with his granddaughter.  He states that he feels sick.  He feels weak.  He reports shortness of breath however this is a chronic finding.  His shortness of breath is been present for months.  He does report cough.  Over the last 2 days he has developed chills and a low-grade temperature of 99.1 which is abnormally high for this patient per his granddaughter.  He reports left flank pain.  On exam today he has left-sided CVA tenderness.  He also reports left-sided pelvic discomfort.  He reports occasional dysuria.  He reports hesitancy and weak stream.  Urinalysis today does show blood however otherwise is completely normal.  His abdomen is soft, nondistended, nontender with normal bowel sounds.  He denies any constipation or diarrhea or nausea or vomiting. Past Medical History:  Diagnosis Date  . Acute venous embolism and thrombosis of unspecified deep vessels of lower extremity   . Atrial flutter (HCC)    and SVT that is post ablation  . CAD (coronary artery disease)   . Chronic kidney disease    stones  . Coronary atherosclerosis 11/19/2008   Qualifier: Diagnosis of  By: Stanford Breed, MD, Kandyce Rud   . Dyslipidemia   . GERD (gastroesophageal reflux disease)   . HTN (hypertension)   . Shortness of breath    on exertion   Past Surgical History:  Procedure Laterality Date  . CARDIAC CATHETERIZATION     s/p cardiac catheterization and bypass surgery as well as EGD with dilation x3, left lower extremity vein stripping  . CORONARY ARTERY BYPASS GRAFT    . ESOPHAGOGASTRODUODENOSCOPY N/A 12/06/2013   Procedure: ESOPHAGOGASTRODUODENOSCOPY (EGD);  Surgeon: Lear Ng, MD;  Location: [facility];  Service: Endoscopy;  Laterality: N/A;  . left knee anthroscopy    . SECONDARY CLOSURE ARM    . VIDEO BRONCHOSCOPY Bilateral 01/14/2018   Procedure: VIDEO  BRONCHOSCOPY WITHOUT FLUORO;  Surgeon: Tanda Rockers, MD;  Location: [facility];  Service: Cardiopulmonary;  Laterality: Bilateral;   Current Outpatient Medications on File Prior to Visit  Medication Sig Dispense Refill  . aspirin 81 MG tablet Take 1 tablet (81 mg total) by mouth daily.    . BD INSULIN SYRINGE U/F 31G X 5/16" 0.3 ML MISC USE WITH NOVOLIN TWICE DAY WITH A MEAL 100 each 5  . cephALEXin (KEFLEX) 500 MG capsule Take 1 capsule (500 mg total) by mouth 4 (four) times daily. 28 capsule 0  . clobetasol cream (TEMOVATE) 0.05 % Please specify directions, refills and quantity 1 g 0  . clotrimazole (LOTRIMIN) 1 % cream Apply 1 application topically 2 (two) times daily. Apply to the head of the penis 30 g 0  . HYDROcodone-acetaminophen (NORCO) 5-325 MG tablet Take 1 tablet by mouth every 6 (six) hours as needed for moderate pain. 30 tablet 0  . hydrOXYzine (ATARAX/VISTARIL) 25 MG tablet TAKE 1 TABLET BY MOUTH 3 TIMES DAILY AS NEEDED FOR ITCHING. 30 tablet 0  . insulin NPH-regular Human (NOVOLIN 70/30) (70-30) 100 UNIT/ML injection Inject 8-12 Units into the skin See admin instructions. Inject 8 units SQ in the morning and inject 12 units SQ at bedtime 10 mL 11  . nitroGLYCERIN (NITROSTAT) 0.4 MG SL tablet Place 0.4 mg under the tongue every 5 (five) minutes as needed for chest pain.    Donald Siva  test strip USE TO CHECK BLOOD SUGAR 3 TO 6 TIMES DAILY. E11.9 100 strip 4  . pantoprazole (PROTONIX) 40 MG tablet TAKE 1 TABLET BY MOUTH EVERY DAY 90 tablet 3  . pravastatin (PRAVACHOL) 40 MG tablet Take 1 tablet (40 mg total) by mouth daily. 90 tablet 3  . tamsulosin (FLOMAX) 0.4 MG CAPS capsule TAKE 1 CAPSULE BY MOUTH DAILY AFTER SUPPER. 90 capsule 3  . triamcinolone cream (KENALOG) 0.1 % Apply 1 application topically 2 (two) times daily. 453 g 0   No current facility-administered medications on file prior to visit.    Allergies  Allergen Reactions  . Celebrex [Celecoxib] Rash and Other  (See Comments)    Blister   Social History   Socioeconomic History  . Marital status: Widowed    Spouse name: Not on file  . Number of children: Not on file  . Years of education: Not on file  . Highest education level: Not on file  Occupational History  . Not on file  Social Needs  . Financial resource strain: Not on file  . Food insecurity    Worry: Not on file    Inability: Not on file  . Transportation needs    Medical: Not on file    Non-medical: Not on file  Tobacco Use  . Smoking status: Current Every Day Smoker    Packs/day: 1.00    Years: 60.00    Pack years: 60.00    Types: Cigarettes  . Smokeless tobacco: Never Used  Substance and Sexual Activity  . Alcohol use: No  . Drug use: No  . Sexual activity: Not on file  Lifestyle  . Physical activity    Days per week: Not on file    Minutes per session: Not on file  . Stress: Not on file  Relationships  . Social Herbalist on phone: Not on file    Gets together: Not on file    Attends religious service: Not on file    Active member of club or organization: Not on file    Attends meetings of clubs or organizations: Not on file    Relationship status: Not on file  . Intimate partner violence    Fear of current or ex partner: Not on file    Emotionally abused: Not on file    Physically abused: Not on file    Forced sexual activity: Not on file  Other Topics Concern  . 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       Review of Systems  All other systems reviewed and are negative.      Objective:   Physical Exam  Constitutional: He appears well-developed and well-nourished. No distress.  Cardiovascular: Normal rate, regular rhythm and normal heart sounds. Exam reveals no gallop and no friction rub.  No murmur heard. Pulmonary/Chest: Effort normal. No  respiratory distress. He has no wheezes. He has no rales. He exhibits no tenderness.  Abdominal: Soft. Bowel sounds are normal. He exhibits no distension and no mass. There is no abdominal tenderness. There is CVA tenderness. There is no rebound and no guarding.  Musculoskeletal:     Thoracic back: He exhibits tenderness and pain.       Back:  Skin: No rash noted. He is not diaphoretic. No erythema.  Vitals reviewed.  Assessment & Plan:  1. Type 2 diabetes mellitus with diabetic neuropathy, with long-term current use of insulin (HCC) Patient can return fasting at any time to check a CBC, CMP, and fasting lipid panel along with a hemoglobin A1c. - Hemoglobin A1c - CBC with Differential/Platelet - COMPLETE METABOLIC PANEL WITH GFR  2. Pure hypercholesterolemia Return fasting for a fasting lipid panel. 3. Cough Cough is chronic.  There is been no acute change.  However given his fever I would like to rule out pneumonia by obtaining a chest x-ray.  Patient has been quarantined at home so I have a very low suspicion for coronavirus however I will also test for coronavirus.  However I suspect that his cough is likely his chronic smoker's cough and has nothing to do with his fever - DG Chest 2 View; Future - Novel Coronavirus, NAA ([facility])  4. Fever, unspecified fever cause Urinalysis does show blood.  I question if the patient may have blood in his urine due to his kidney stones which have been seen on his CT scan last year in May.  However I believe the patient most likely has prostatitis.  I recommended Levaquin 500 mg p.o. daily for 7 days and await the results of his chest x-ray and coronavirus screening

## 2019-02-04 NOTE — Addendum Note (Signed)
Addended by: Shary Decamp B on: 02/04/2019 11:21 AM   Modules accepted: Orders

## 2019-02-06 LAB — NOVEL CORONAVIRUS, NAA: SARS-CoV-2, NAA: NOT DETECTED

## 2019-02-06 LAB — SPECIMEN STATUS REPORT

## 2019-02-06 IMAGING — DX DG LUMBAR SPINE COMPLETE 4+V
5 series · 5 of 5 positions shown · non-contrast
Comparison: CT abdomen and pelvis 11/05/2017

CLINICAL DATA: Low back pain for 2 months, no injury, history
kidney stones

EXAM:
LUMBAR SPINE - COMPLETE 4+ VIEW

[dg lumbar spine complete 4 +v (1 of 5)]
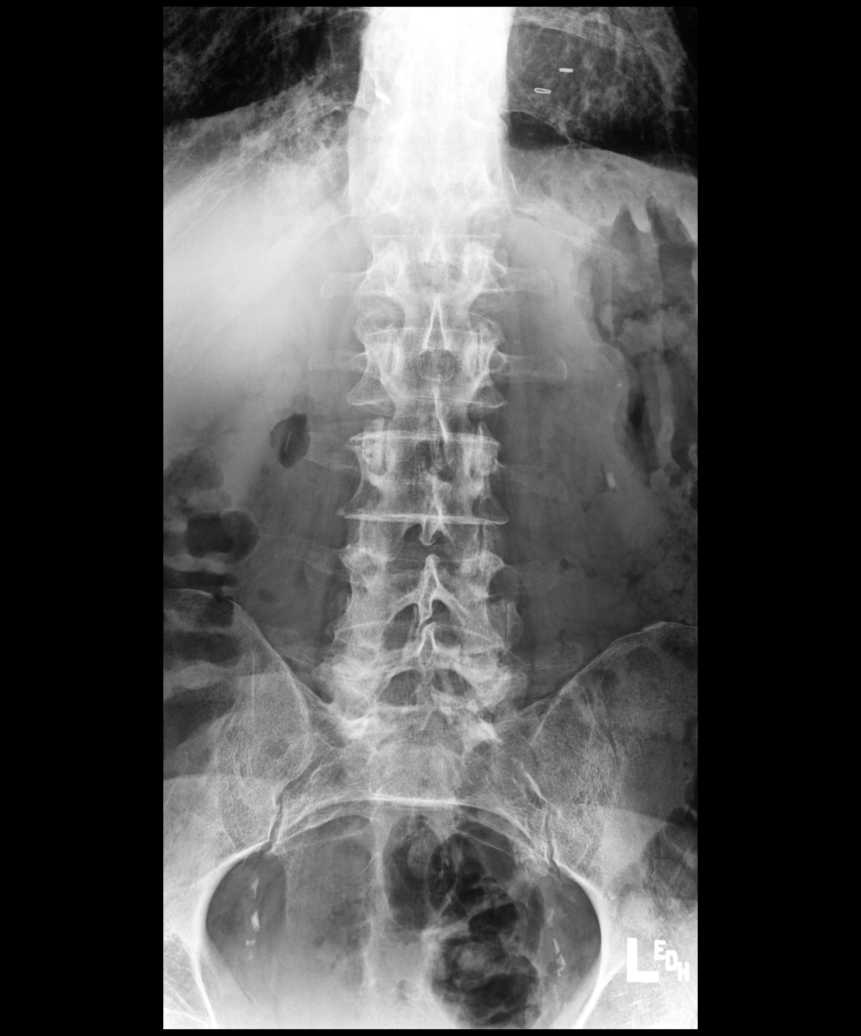

[dg lumbar spine complete 4 +v (2 of 5)]
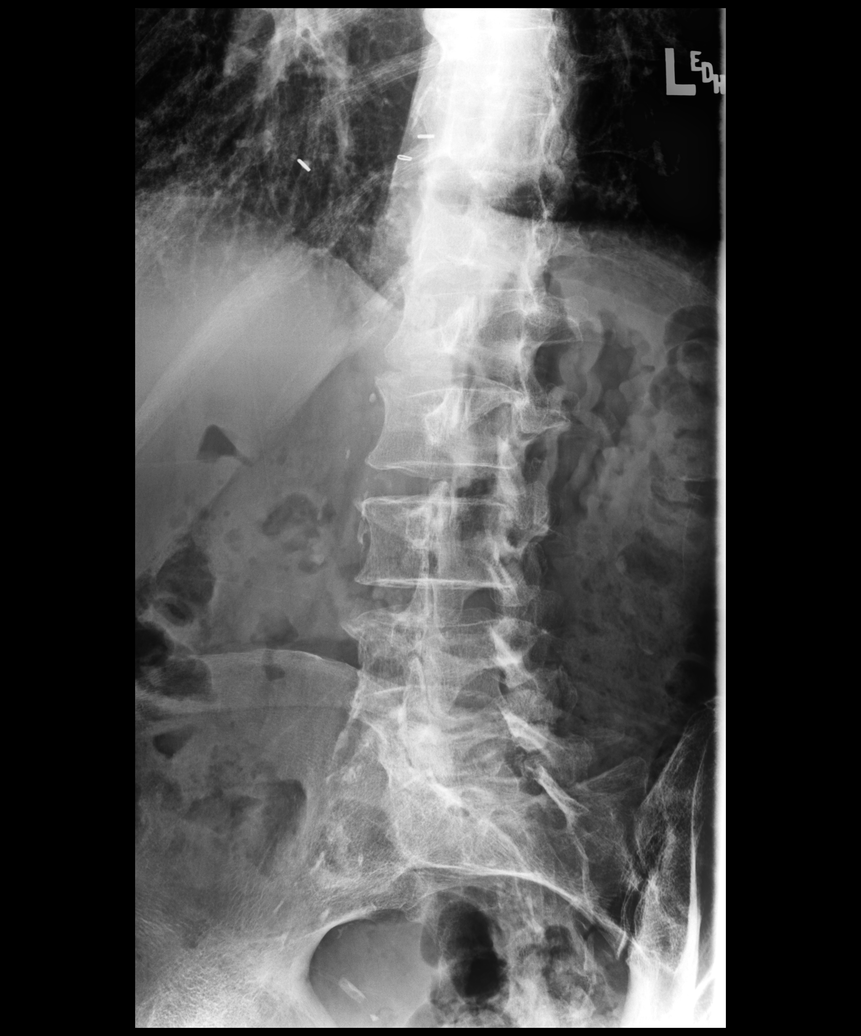

[dg lumbar spine complete 4 +v (3 of 5)]
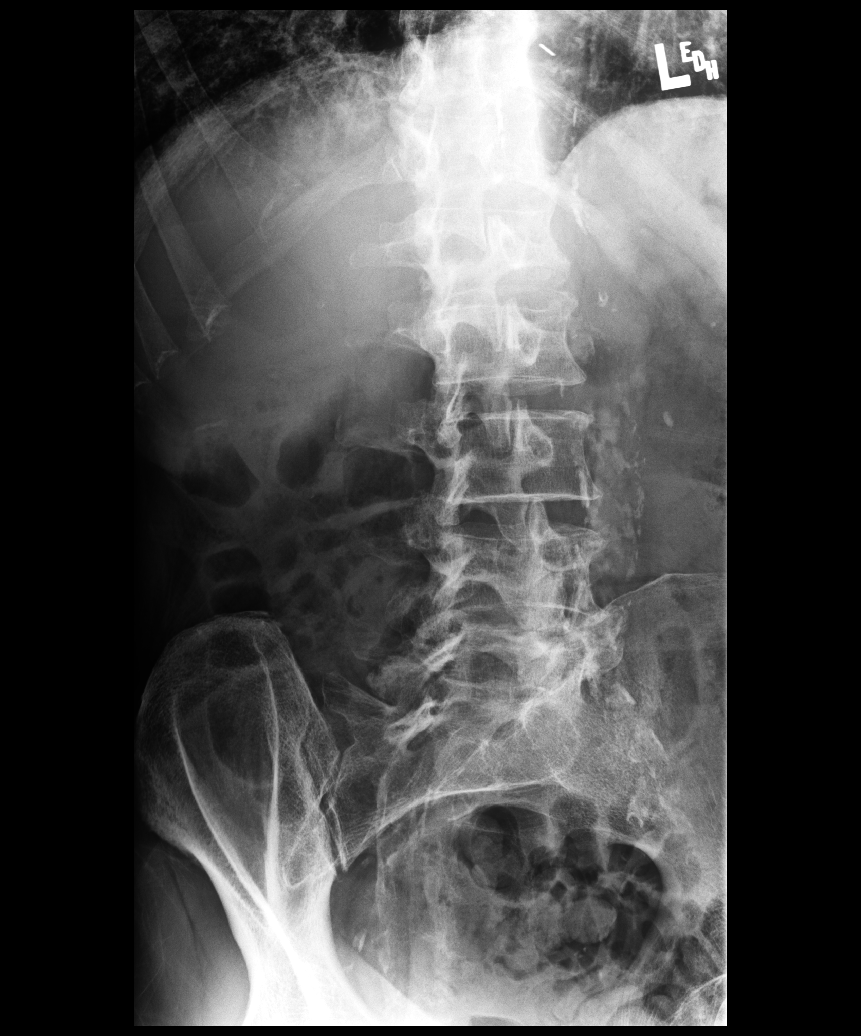

[dg lumbar spine complete 4 +v (4 of 5)]
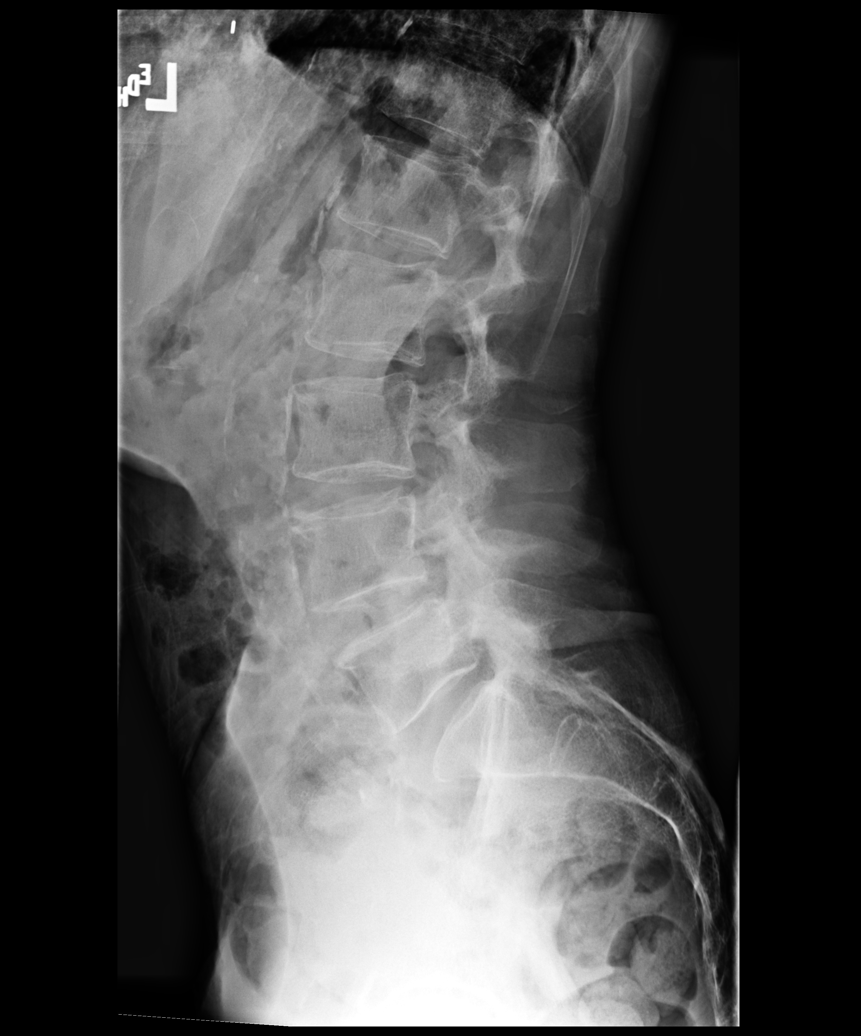

[dg lumbar spine complete 4 +v (5 of 5)]
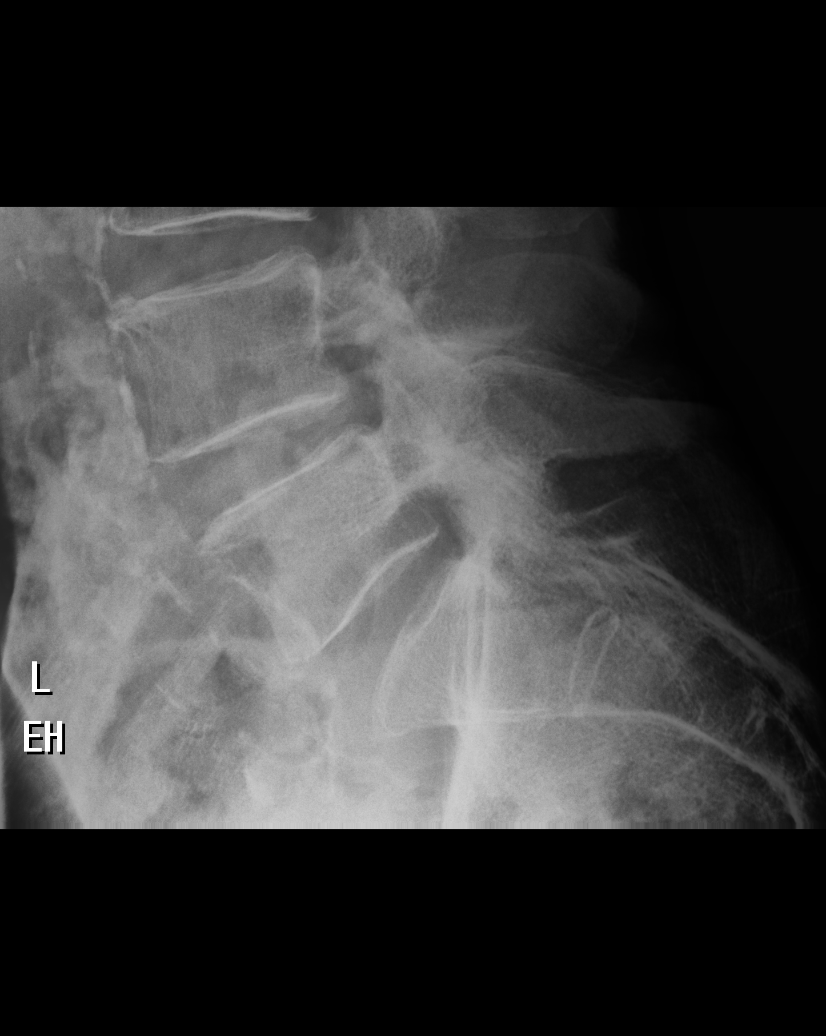

[5 of 5 positions shown; findings below may reference images not displayed]

FINDINGS: Osseous demineralization.

Five non-rib-bearing lumbar vertebra.

Vertebral body and disc space heights maintained.

No acute fracture, subluxation, or bone destruction.

No spondylolysis.

Facet degenerative changes at L4-L5 and L5-S1.

SI joints preserved.

Scattered atherosclerotic calcifications aorta, iliac arteries.

Two LEFT renal calculi, larger calculus 6 x 3 mm diameter.
IMPRESSION: Degenerative facet disease changes of the lower lumbar spine.

No acute lumbar spine abnormalities.

Two nonobstructing LEFT renal calculi, larger measuring 6 x 3 mm.

## 2019-02-10 ENCOUNTER — Other Ambulatory Visit: Payer: Self-pay | Admitting: Family Medicine

## 2019-02-10 DIAGNOSIS — R69 Illness, unspecified: Secondary | ICD-10-CM | POA: Diagnosis not present

## 2019-03-06 DIAGNOSIS — R69 Illness, unspecified: Secondary | ICD-10-CM | POA: Diagnosis not present

## 2019-03-08 ENCOUNTER — Telehealth: Payer: Self-pay | Admitting: Family Medicine

## 2019-03-08 DIAGNOSIS — R69 Illness, unspecified: Secondary | ICD-10-CM | POA: Diagnosis not present

## 2019-03-08 MED ORDER — ONETOUCH ULTRA VI STRP: ORAL_STRIP | 4 refills | Status: DC

## 2019-03-08 NOTE — Telephone Encounter (Signed)
Increase in number of strips sent to pharm

## 2019-03-08 NOTE — Telephone Encounter (Signed)
Patient is calling to say that he is checking his blood sugar 4 to 5 times a day, the quantity of test strips that he gets is not enough to last since he is checking so frequently  Would like to know if more can be call in that what is being called in  [phone] [facility]

## 2019-03-12 ENCOUNTER — Other Ambulatory Visit: Payer: Self-pay | Admitting: Family Medicine

## 2019-03-12 MED ORDER — TRIAMCINOLONE ACETONIDE 0.1 % EX CREA: 1.0000 "application " | TOPICAL_CREAM | Freq: Two times a day (BID) | CUTANEOUS | 0 refills | Status: DC

## 2019-03-12 NOTE — Telephone Encounter (Signed)
Patient is requesting a refill on Hydrocodone   LOV: 02/04/19  LRF:   02/01/19

## 2019-03-12 NOTE — Telephone Encounter (Signed)
Patient needs refill on his hydrocodone  And triamcinol cream  [facility]

## 2019-03-15 MED ORDER — HYDROCODONE-ACETAMINOPHEN 5-325 MG PO TABS: 1.0000 | ORAL_TABLET | Freq: Four times a day (QID) | ORAL | 0 refills | Status: DC | PRN

## 2019-03-30 ENCOUNTER — Telehealth: Payer: Self-pay | Admitting: Family Medicine

## 2019-03-30 NOTE — Telephone Encounter (Signed)
Patient is calling to say that he needs refill on the  one touch ultra test strips , he tests his sugar sometimes 4 times a day and that qty is not sufficient, needs larger quantity if possible  Also needs refill on triamcinolone [facility]

## 2019-03-31 ENCOUNTER — Other Ambulatory Visit: Payer: Self-pay | Admitting: Family Medicine

## 2019-03-31 DIAGNOSIS — R69 Illness, unspecified: Secondary | ICD-10-CM | POA: Diagnosis not present

## 2019-03-31 IMAGING — DX DG CHEST 2V
2 series · 2 of 2 positions shown · non-contrast
Comparison: January 14, 2018

CLINICAL DATA: Follow-up right lower lobe infiltrate

EXAM:
CHEST - 2 VIEW

[chest pa]
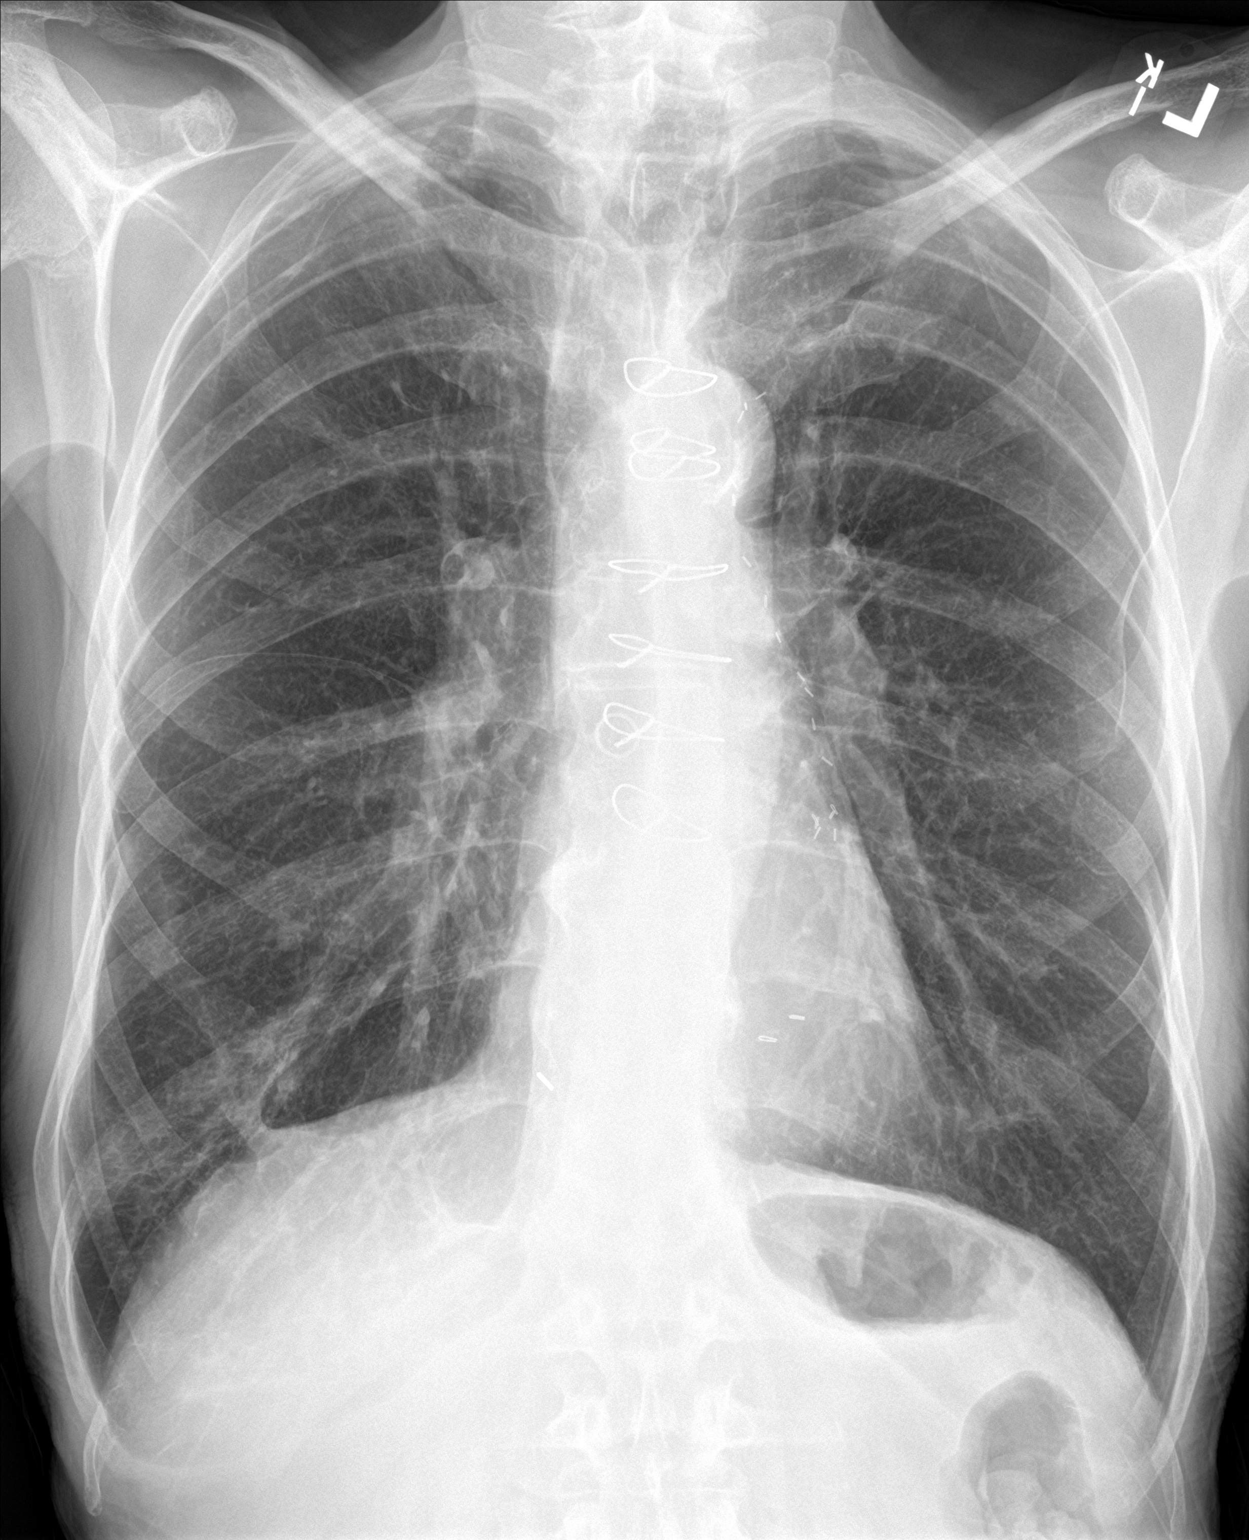

[chest lat]
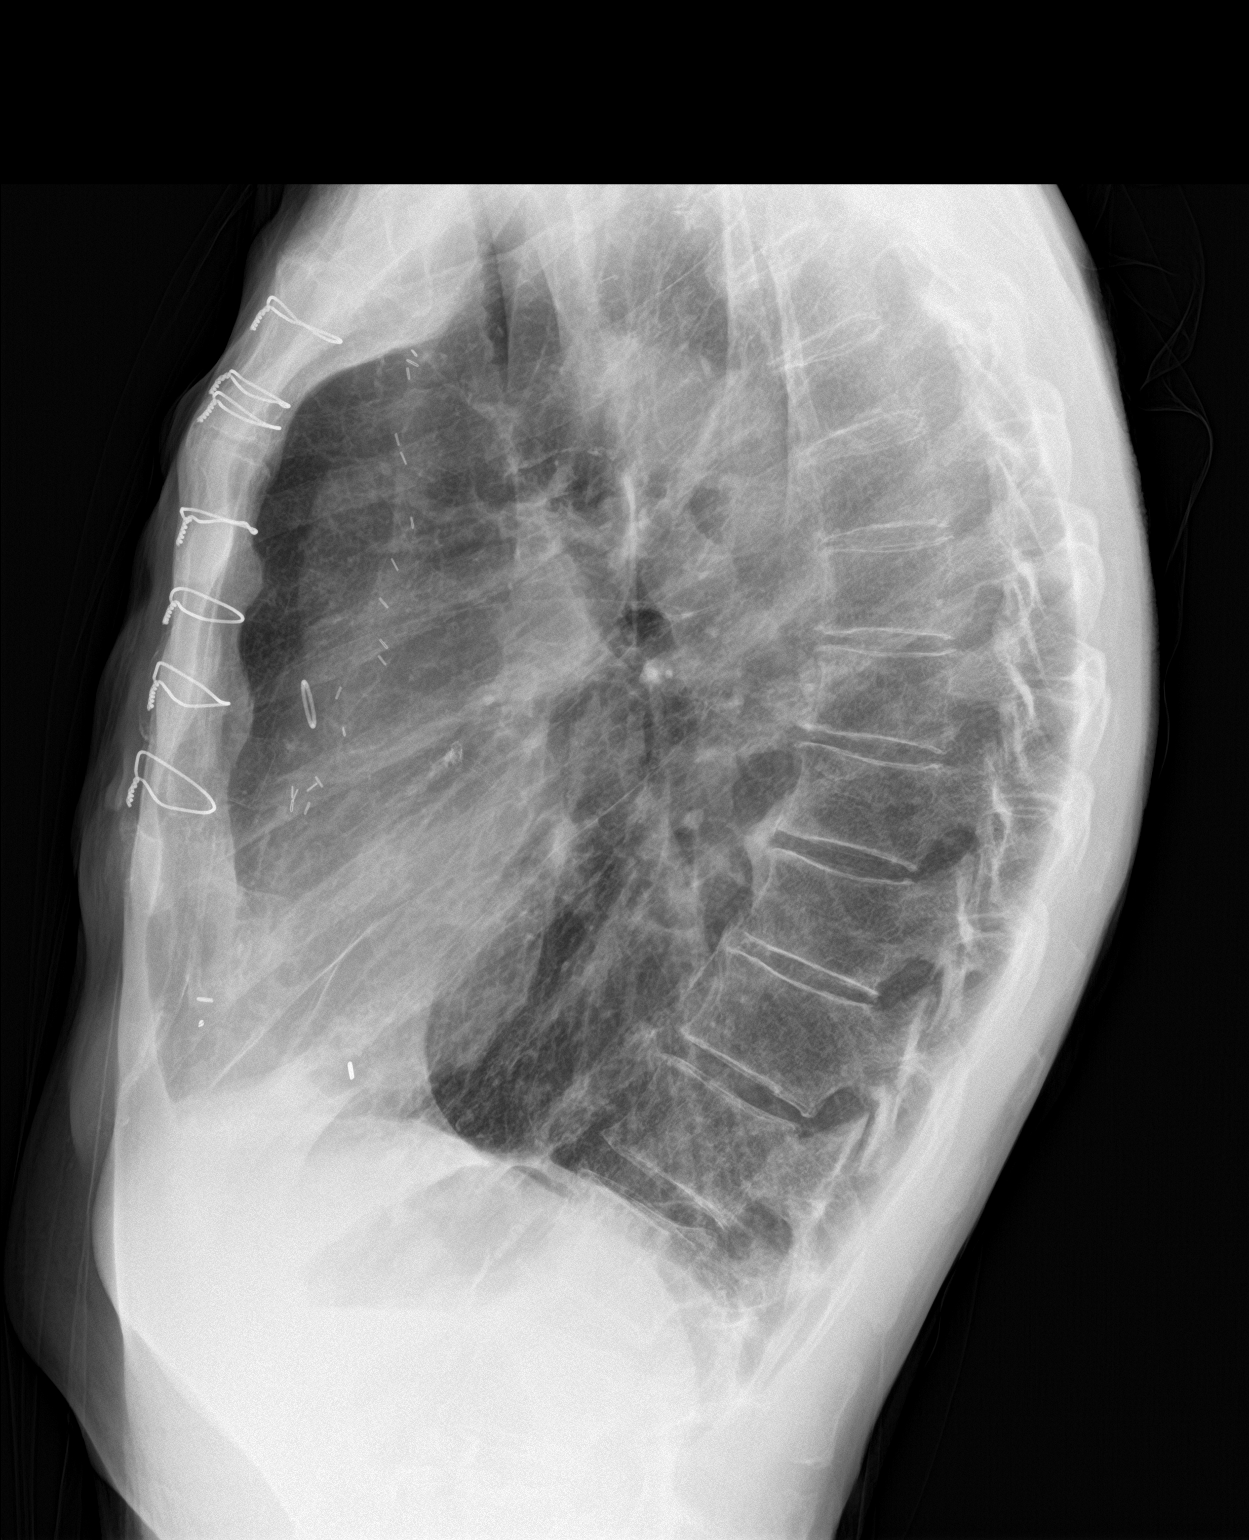

[2 of 2 positions shown; findings below may reference images not displayed]

FINDINGS: The heart size and mediastinal contours are stable. Patient status
post prior median sternotomy and CABG. Previously noted pneumonia in
the right lung base is improved with residual opacity identified.
The left lung is clear. The visualized skeletal structures are
stable.
IMPRESSION: Previously noted pneumonia in the right lung base is improved with
residual opacity identified. Continued follow-up is recommended to
ensure resolution.

## 2019-03-31 MED ORDER — ONETOUCH ULTRA VI STRP: ORAL_STRIP | 4 refills | Status: DC

## 2019-03-31 MED ORDER — TRIAMCINOLONE ACETONIDE 0.1 % EX CREA: 1.0000 "application " | TOPICAL_CREAM | Freq: Two times a day (BID) | CUTANEOUS | 0 refills | Status: DC

## 2019-03-31 NOTE — Telephone Encounter (Signed)
Refilled TAC cream. Sent new rx for qid test strips. Insurance may/will not cover qid testing but will cover tid testing.

## 2019-04-04 ENCOUNTER — Other Ambulatory Visit: Payer: Self-pay | Admitting: Family Medicine

## 2019-04-05 ENCOUNTER — Telehealth: Payer: Self-pay | Admitting: Family Medicine

## 2019-04-05 NOTE — Telephone Encounter (Signed)
Received a fax back from the pharmacy stating that his insurance will not cover him testing his blood sugar 4 times and the script sent in for testing strips was denied. Is there any reason why patient should test 4 times a day?

## 2019-04-06 NOTE — Telephone Encounter (Signed)
Left message return call

## 2019-04-06 NOTE — Telephone Encounter (Signed)
No decrease testing to once a day

## 2019-04-07 NOTE — Telephone Encounter (Signed)
Spoke with patient Granddaughter and informed her that Dr.Pickard recommends testing once a day. Patient's granddaughter verbalized understanding and stated that she will tell him.

## 2019-04-14 DIAGNOSIS — R69 Illness, unspecified: Secondary | ICD-10-CM | POA: Diagnosis not present

## 2019-04-28 ENCOUNTER — Other Ambulatory Visit: Payer: Self-pay | Admitting: Family Medicine

## 2019-04-28 NOTE — Telephone Encounter (Signed)
Patient needs refill on hydrocodone  [facility]

## 2019-04-28 NOTE — Telephone Encounter (Signed)
Patient is requesting a refill on Hydrocodone   LOV:  02/04/19  LRF:  03/15/19

## 2019-04-29 MED ORDER — HYDROCODONE-ACETAMINOPHEN 5-325 MG PO TABS: 1.0000 | ORAL_TABLET | Freq: Four times a day (QID) | ORAL | 0 refills | Status: DC | PRN

## 2019-05-09 DIAGNOSIS — R69 Illness, unspecified: Secondary | ICD-10-CM | POA: Diagnosis not present

## 2019-05-25 DIAGNOSIS — Z9842 Cataract extraction status, left eye: Secondary | ICD-10-CM | POA: Diagnosis not present

## 2019-05-25 DIAGNOSIS — H02132 Senile ectropion of right lower eyelid: Secondary | ICD-10-CM | POA: Diagnosis not present

## 2019-05-25 DIAGNOSIS — H02135 Senile ectropion of left lower eyelid: Secondary | ICD-10-CM | POA: Diagnosis not present

## 2019-05-25 DIAGNOSIS — H5989 Other postprocedural complications and disorders of eye and adnexa, not elsewhere classified: Secondary | ICD-10-CM | POA: Diagnosis not present

## 2019-05-25 DIAGNOSIS — H16213 Exposure keratoconjunctivitis, bilateral: Secondary | ICD-10-CM | POA: Diagnosis not present

## 2019-05-25 DIAGNOSIS — H2011 Chronic iridocyclitis, right eye: Secondary | ICD-10-CM | POA: Diagnosis not present

## 2019-05-25 DIAGNOSIS — Z9841 Cataract extraction status, right eye: Secondary | ICD-10-CM | POA: Diagnosis not present

## 2019-06-01 DIAGNOSIS — R69 Illness, unspecified: Secondary | ICD-10-CM | POA: Diagnosis not present

## 2019-06-14 ENCOUNTER — Other Ambulatory Visit: Payer: Self-pay

## 2019-06-14 MED ORDER — HYDROCODONE-ACETAMINOPHEN 5-325 MG PO TABS: 1.0000 | ORAL_TABLET | Freq: Four times a day (QID) | ORAL | 0 refills | Status: DC | PRN

## 2019-06-14 MED ORDER — HYDROXYZINE HCL 25 MG PO TABS: ORAL_TABLET | ORAL | 0 refills | Status: DC

## 2019-06-14 MED ORDER — "INSULIN SYRINGE-NEEDLE U-100 31G X 5/16"" 0.3 ML MISC": 5 refills | Status: DC

## 2019-06-14 MED ORDER — TRIAMCINOLONE ACETONIDE 0.1 % EX CREA: 1.0000 "application " | TOPICAL_CREAM | Freq: Two times a day (BID) | CUTANEOUS | 0 refills | Status: DC

## 2019-06-14 NOTE — Telephone Encounter (Signed)
Requested Prescriptions   Pending Prescriptions Disp Refills  . HYDROcodone-acetaminophen (NORCO) 5-325 MG tablet 30 tablet 0    Sig: Take 1 tablet by mouth every 6 (six) hours as needed for moderate pain.  . hydrOXYzine (ATARAX/VISTARIL) 25 MG tablet 30 tablet 0    Sig: TAKE 1 TABLET BY MOUTH 3 TIMES DAILY AS NEEDED FOR ITCHING.   Signed Prescriptions Disp Refills  . Insulin Syringe-Needle U-100 (BD INSULIN SYRINGE U/F) 31G X 5/16" 0.3 ML MISC 100 each 5    Sig: USE WITH NOVOLIN TWICE DAY WITH A MEAL    Authorizing Provider: Jenna Luo T    Ordering User: Hercules Hasler C  . triamcinolone cream (KENALOG) 0.1 % 453 g 0    Sig: Apply 1 application topically 2 (two) times daily.    Authorizing Provider: Susy Frizzle    Ordering User: Vanice Sarah     Last OV 02/04/2019   Last written 04/29/2019

## 2019-07-07 DIAGNOSIS — R69 Illness, unspecified: Secondary | ICD-10-CM | POA: Diagnosis not present

## 2019-07-25 ENCOUNTER — Ambulatory Visit: Payer: Medicare HMO

## 2019-07-30 ENCOUNTER — Ambulatory Visit: Payer: Medicare HMO | Attending: Internal Medicine

## 2019-07-30 DIAGNOSIS — Z23 Encounter for immunization: Secondary | ICD-10-CM | POA: Insufficient documentation

## 2019-07-30 NOTE — Progress Notes (Signed)
   Covid-19 Vaccination Clinic  Name:  [name]    MRN: [id number] DOB: [birth year]  07/30/2019  [name] was observed post Covid-19 immunization for 15 minutes without incidence. He was provided with Vaccine Information Sheet and instruction to access the V-Safe system.   [name] was instructed to call 911 with any severe reactions post vaccine: Marland Kitchen Difficulty breathing  . Swelling of your face and throat  . A fast heartbeat  . A bad rash all over your body  . Dizziness and weakness    Immunizations Administered    Name Date Dose VIS Date Route   Pfizer COVID-19 Vaccine 07/30/2019  9:10 AM 0.3 mL 06/04/2019 Intramuscular   Manufacturer: [place]   Lot: [id number]   [place]: [id number]

## 2019-08-03 DIAGNOSIS — R69 Illness, unspecified: Secondary | ICD-10-CM | POA: Diagnosis not present

## 2019-08-15 ENCOUNTER — Ambulatory Visit: Payer: Medicare HMO

## 2019-08-18 ENCOUNTER — Other Ambulatory Visit: Payer: Self-pay

## 2019-08-18 ENCOUNTER — Encounter: Payer: Self-pay | Admitting: *Deleted

## 2019-08-18 ENCOUNTER — Ambulatory Visit (HOSPITAL_COMMUNITY)
Admission: RE | Admit: 2019-08-18 | Discharge: 2019-08-18 | Disposition: A | Discharge status: Home / Self Care | Payer: Medicare HMO | Source: Ambulatory Visit | Attending: Cardiovascular Disease | Admitting: Cardiovascular Disease

## 2019-08-18 ENCOUNTER — Other Ambulatory Visit: Payer: Self-pay | Admitting: *Deleted

## 2019-08-18 DIAGNOSIS — I679 Cerebrovascular disease, unspecified: Secondary | ICD-10-CM

## 2019-08-20 DIAGNOSIS — Z0100 Encounter for examination of eyes and vision without abnormal findings: Secondary | ICD-10-CM | POA: Diagnosis not present

## 2019-08-20 DIAGNOSIS — H524 Presbyopia: Secondary | ICD-10-CM | POA: Diagnosis not present

## 2019-08-20 DIAGNOSIS — E119 Type 2 diabetes mellitus without complications: Secondary | ICD-10-CM | POA: Diagnosis not present

## 2019-08-24 ENCOUNTER — Ambulatory Visit: Payer: Medicare HMO | Attending: Internal Medicine

## 2019-08-24 DIAGNOSIS — Z23 Encounter for immunization: Secondary | ICD-10-CM | POA: Insufficient documentation

## 2019-08-24 NOTE — Progress Notes (Signed)
   Covid-19 Vaccination Clinic  Name:  [name]    MRN: [id number] DOB: [birth year]  08/24/2019  [name] was observed post Covid-19 immunization for 15 minutes without incident. He was provided with Vaccine Information Sheet and instruction to access the V-Safe system.   [name] was instructed to call 911 with any severe reactions post vaccine: Marland Kitchen Difficulty breathing  . Swelling of face and throat  . A fast heartbeat  . A bad rash all over body  . Dizziness and weakness   Immunizations Administered    Name Date Dose VIS Date Route   Pfizer COVID-19 Vaccine 08/24/2019  9:45 AM 0.3 mL 06/04/2019 Intramuscular   Manufacturer: [place]   Lot: [id number]   [place]: [id number]

## 2019-08-27 ENCOUNTER — Telehealth: Payer: Self-pay | Admitting: Family Medicine

## 2019-08-27 MED ORDER — HYDROXYZINE HCL 25 MG PO TABS: ORAL_TABLET | ORAL | 0 refills | Status: DC

## 2019-08-27 MED ORDER — TRIAMCINOLONE ACETONIDE 0.1 % EX CREA: 1.0000 "application " | TOPICAL_CREAM | Freq: Two times a day (BID) | CUTANEOUS | 0 refills | Status: DC

## 2019-08-27 NOTE — Telephone Encounter (Signed)
Patient needs refill on his triamcinolone and also hydroxyzine  [facility]

## 2019-08-27 NOTE — Telephone Encounter (Signed)
Medication called/sent to requested pharmacy  

## 2019-08-31 DIAGNOSIS — R69 Illness, unspecified: Secondary | ICD-10-CM | POA: Diagnosis not present

## 2019-09-07 ENCOUNTER — Telehealth: Payer: Self-pay | Admitting: Family Medicine

## 2019-09-07 MED ORDER — HYDROXYZINE HCL 25 MG PO TABS: ORAL_TABLET | ORAL | 0 refills | Status: DC

## 2019-09-07 NOTE — Telephone Encounter (Signed)
PA Submitted through [email or web address] and received the following:  Your information has been submitted to [place] [employer] Part D. [employer] will review the request and will issue a decision, typically within 1-3 days from your submission. You can check the updated outcome later by reopening this request.  If [employer] has not responded in 1-3 days or if you have any questions about your ePA request, please contact [place] [employer] Part D at [phone]. If you think there may be a problem with your PA request, use our live chat feature at the bottom right.

## 2019-09-07 NOTE — Telephone Encounter (Signed)
Approved from 06/25/2019 - 06/23/2020. Pharmacy made aware

## 2019-09-23 DIAGNOSIS — R69 Illness, unspecified: Secondary | ICD-10-CM | POA: Diagnosis not present

## 2019-10-07 NOTE — Progress Notes (Signed)
HPI: FU coronary artery disease, status post coronary bypassing graft. This was performed in 1998. His last catheterization in October 2008 showed a patent LIMA to the LAD and a patent saphenous vein graft to the right coronary artery. However, he did have significant left main disease that bifurcated into the circumflex. He had a drug-eluting stent to his left main at that time. Abdominal ultrasound in August of 2011 showed no aneurysm. He also has a history of SVT and atrial flutter ablation. Nuclear study June 2020 showed ejection fraction 60% with normal perfusion. Carotid Dopplers February 2021 showed 40 to 59% right and 60 to 79% left stenosis.  Since I last saw himhe has dyspnea on exertion unchanged.  No orthopnea, PND, pedal edema, palpitations or syncope.  Occasional brief chest pain for seconds.  Current Outpatient Medications  Medication Sig Dispense Refill  . aspirin 81 MG tablet Take 1 tablet (81 mg total) by mouth daily.    . clobetasol cream (TEMOVATE) 0.05 % Please specify directions, refills and quantity 1 g 0  . clotrimazole (LOTRIMIN) 1 % cream Apply 1 application topically 2 (two) times daily. Apply to the head of the penis 30 g 0  . glucose blood (ONETOUCH ULTRA) test strip USE TO CHECK BLOOD SUGAR 4 TIMES DAILY. E11.9 150 strip 4  . HYDROcodone-acetaminophen (NORCO) 5-325 MG tablet Take 1 tablet by mouth every 6 (six) hours as needed for moderate pain. 30 tablet 0  . hydrOXYzine (ATARAX/VISTARIL) 25 MG tablet TAKE 1 TABLET BY MOUTH 3 TIMES DAILY AS NEEDED FOR ITCHING. 30 tablet 0  . Insulin Syringe-Needle U-100 (BD INSULIN SYRINGE U/F) 31G X 5/16" 0.3 ML MISC USE WITH NOVOLIN TWICE DAY WITH A MEAL 100 each 5  . Lancets (ONETOUCH DELICA PLUS [id number]) MISC USE TO CHECK BLOOD SUAGR 3 TIMES A DAY DX:E11.9 100 each 5  . levofloxacin (LEVAQUIN) 500 MG tablet Take 1 tablet (500 mg total) by mouth daily. 7 tablet 0  . nitroGLYCERIN (NITROSTAT) 0.4 MG SL tablet Place 0.4 mg  under the tongue every 5 (five) minutes as needed for chest pain.    Marland Kitchen NOVOLIN 70/30 (70-30) 100 UNIT/ML injection INJECT 8 UNITS SQ IN THE MORNING AND INJECT 12 UNITS SQ AT BEDTIME 10 mL 11  . pantoprazole (PROTONIX) 40 MG tablet TAKE 1 TABLET BY MOUTH EVERY DAY 90 tablet 3  . pravastatin (PRAVACHOL) 40 MG tablet Take 1 tablet (40 mg total) by mouth daily. 90 tablet 3  . tamsulosin (FLOMAX) 0.4 MG CAPS capsule TAKE 1 CAPSULE BY MOUTH DAILY AFTER SUPPER. 90 capsule 3  . triamcinolone cream (KENALOG) 0.1 % Apply 1 application topically 2 (two) times daily. 453 g 0   No current facility-administered medications for this visit.     Past Medical History:  Diagnosis Date  . Acute venous embolism and thrombosis of unspecified deep vessels of lower extremity   . Atrial flutter (HCC)    and SVT that is post ablation  . CAD (coronary artery disease)   . Chronic kidney disease    stones  . Coronary atherosclerosis 11/19/2008   Qualifier: Diagnosis of  By: Stanford Breed, MD, Kandyce Rud   . Dyslipidemia   . GERD (gastroesophageal reflux disease)   . HTN (hypertension)   . Shortness of breath    on exertion    Past Surgical History:  Procedure Laterality Date  . CARDIAC CATHETERIZATION     s/p cardiac catheterization and bypass surgery as well as EGD  with dilation x3, left lower extremity vein stripping  . CORONARY ARTERY BYPASS GRAFT    . ESOPHAGOGASTRODUODENOSCOPY N/A 12/06/2013   Procedure: ESOPHAGOGASTRODUODENOSCOPY (EGD);  Surgeon: Lear Ng, MD;  Location: [facility];  Service: Endoscopy;  Laterality: N/A;  . left knee anthroscopy    . SECONDARY CLOSURE ARM    . VIDEO BRONCHOSCOPY Bilateral 01/14/2018   Procedure: VIDEO BRONCHOSCOPY WITHOUT FLUORO;  Surgeon: Tanda Rockers, MD;  Location: [facility];  Service: Cardiopulmonary;  Laterality: Bilateral;    Social History   Socioeconomic History  . Marital status: Widowed    Spouse name: Not on file  . Number of  children: Not on file  . Years of education: Not on file  . Highest education level: Not on file  Occupational History  . Not on file  Tobacco Use  . Smoking status: Current Every Day Smoker    Packs/day: 1.00    Years: 60.00    Pack years: 60.00    Types: Cigarettes  . Smokeless tobacco: Never Used  Substance and Sexual Activity  . Alcohol use: No  . Drug use: No  . Sexual activity: Not on file  Other Topics Concern  . 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    Social Determinants of Health   Financial Resource Strain:   . Difficulty of Paying Living Expenses:   Food Insecurity:   . Worried About Charity fundraiser in the Last Year:   . Arboriculturist in the Last Year:   Transportation Needs:   . Film/video editor (Medical):   Marland Kitchen Lack of Transportation (Non-Medical):   Physical Activity:   . Days of Exercise per Week:   . Minutes of Exercise per Session:   Stress:   . Feeling of Stress :   Social Connections:   . Frequency of Communication with Friends and Family:   . Frequency of Social Gatherings with Friends and Family:   . Attends Religious Services:   . Active Member of Clubs or Organizations:   . Attends Archivist Meetings:   Marland Kitchen Marital Status:   Intimate Partner Violence:   . Fear of Current or Ex-Partner:   . Emotionally Abused:   Marland Kitchen Physically Abused:   . Sexually Abused:     History reviewed. No pertinent family history.  ROS: no fevers or chills, productive cough, hemoptysis, dysphasia, odynophagia, melena, hematochezia, dysuria, hematuria, rash, seizure activity, orthopnea, PND, pedal edema, claudication. Remaining systems are negative.  Physical Exam: Well-developed well-nourished in no acute distress.  Skin is warm and dry.  HEENT is normal.  Neck is supple.  Chest is clear to auscultation with  normal expansion.  Cardiovascular exam is regular rate and rhythm.  Abdominal exam nontender or distended. No masses palpated. Extremities show no edema. neuro grossly intact  ECG-sinus rhythm at a rate of 65, no ST changes.  Personally reviewed  A/P  1 coronary artery disease status post coronary artery bypass and graft-patient denies chest pain.  Plan medical therapy with aspirin and statin.  2 hypertension-blood pressure controlled on no medications.  3 hyperlipidemia-continue statin.  Note he has not tolerated higher doses previously.  Check lipids and liver.  4 tobacco abuse-patient again counseled on discontinuing.  5 carotid artery disease-we will arrange follow-up carotid Dopplers 2/22.  6 prior atrial flutter-patient has  had no recurrences documented.  Kirk Ruths, MD

## 2019-10-11 ENCOUNTER — Ambulatory Visit: Payer: Medicare HMO | Admitting: Cardiology

## 2019-10-11 ENCOUNTER — Other Ambulatory Visit: Payer: Self-pay

## 2019-10-11 ENCOUNTER — Encounter: Payer: Self-pay | Admitting: Cardiology

## 2019-10-11 VITALS — BP 136/60 | HR 67 | Temp 97.8°F | Resp 20 | Ht 74.0 in | Wt 148.2 lb

## 2019-10-11 DIAGNOSIS — I679 Cerebrovascular disease, unspecified: Secondary | ICD-10-CM | POA: Diagnosis not present

## 2019-10-11 DIAGNOSIS — E7800 Pure hypercholesterolemia, unspecified: Secondary | ICD-10-CM

## 2019-10-11 DIAGNOSIS — I2510 Atherosclerotic heart disease of native coronary artery without angina pectoris: Secondary | ICD-10-CM | POA: Diagnosis not present

## 2019-10-11 DIAGNOSIS — I10 Essential (primary) hypertension: Secondary | ICD-10-CM | POA: Diagnosis not present

## 2019-10-11 NOTE — Patient Instructions (Signed)
Medication Instructions:  NO CHANGE *If you need a refill on your cardiac medications before your next appointment, please call your pharmacy*   Lab Work: Your physician recommends that you return for lab work PRIOR TO EATING If you have labs (blood work) drawn today and your tests are completely normal, you will receive your results only by: Marland Kitchen MyChart Message (if you have MyChart) OR . A paper copy in the mail If you have any lab test that is abnormal or we need to change your treatment, we will call you to review the results   Follow-Up: At [facility], you and your health needs are our priority.  As part of our continuing mission to provide you with exceptional heart care, we have created designated Provider Care Teams.  These Care Teams include your primary Cardiologist (physician) and Advanced Practice Providers (APPs -  Physician Assistants and Nurse Practitioners) who all work together to provide you with the care you need, when you need it.  We recommend signing up for the patient portal called "MyChart".  Sign up information is provided on this After Visit Summary.  MyChart is used to connect with patients for Virtual Visits (Telemedicine).  Patients are able to view lab/test results, encounter notes, upcoming appointments, etc.  Non-urgent messages can be sent to your provider as well.   To learn more about what you can do with MyChart, go to [email or web address].    Your next appointment:   12 month(s)  The format for your next appointment:   Either In Person or Virtual  Provider:   Kirk Ruths MD

## 2019-10-13 DIAGNOSIS — E7800 Pure hypercholesterolemia, unspecified: Secondary | ICD-10-CM | POA: Diagnosis not present

## 2019-10-14 LAB — HEPATIC FUNCTION PANEL
ALT: 6 IU/L (ref 0–44)
AST: 16 IU/L (ref 0–40)
Albumin: 4.2 g/dL (ref 3.6–4.6)
Alkaline Phosphatase: 72 IU/L (ref 39–117)
Bilirubin Total: 0.4 mg/dL (ref 0.0–1.2)
Bilirubin, Direct: 0.11 mg/dL (ref 0.00–0.40)
Total Protein: 7.7 g/dL (ref 6.0–8.5)

## 2019-10-14 LAB — LIPID PANEL
Chol/HDL Ratio: 3.4 ratio (ref 0.0–5.0)
Cholesterol, Total: 134 mg/dL (ref 100–199)
HDL: 39 mg/dL — ABNORMAL LOW (ref >39)
LDL Chol Calc (NIH): 81 mg/dL (ref 0–99)
Triglycerides: 68 mg/dL (ref 0–149)
VLDL Cholesterol Cal: 14 mg/dL (ref 5–40)

## 2019-10-26 ENCOUNTER — Other Ambulatory Visit: Payer: Self-pay | Admitting: Family Medicine

## 2019-10-26 MED ORDER — TRIAMCINOLONE ACETONIDE 0.1 % EX CREA: 1.0000 "application " | TOPICAL_CREAM | Freq: Two times a day (BID) | CUTANEOUS | 0 refills | Status: DC

## 2019-10-26 MED ORDER — HYDROCODONE-ACETAMINOPHEN 5-325 MG PO TABS: 1.0000 | ORAL_TABLET | Freq: Four times a day (QID) | ORAL | 0 refills | Status: DC | PRN

## 2019-10-26 MED ORDER — HYDROXYZINE HCL 25 MG PO TABS: ORAL_TABLET | ORAL | 0 refills | Status: DC

## 2019-10-26 NOTE — Telephone Encounter (Signed)
CB# [phone] Refill Hydrocodone,Hydroxyzine,Triamcinolone

## 2019-10-26 NOTE — Telephone Encounter (Signed)
Patient is requesting a refill on Hydrocodone   LOV: 02/04/2019  LRF:   06/14/2019

## 2019-10-29 DIAGNOSIS — R69 Illness, unspecified: Secondary | ICD-10-CM | POA: Diagnosis not present

## 2019-11-23 DIAGNOSIS — H52213 Irregular astigmatism, bilateral: Secondary | ICD-10-CM | POA: Diagnosis not present

## 2019-11-23 DIAGNOSIS — E119 Type 2 diabetes mellitus without complications: Secondary | ICD-10-CM | POA: Diagnosis not present

## 2019-11-23 DIAGNOSIS — Z9841 Cataract extraction status, right eye: Secondary | ICD-10-CM | POA: Diagnosis not present

## 2019-11-23 DIAGNOSIS — H353131 Nonexudative age-related macular degeneration, bilateral, early dry stage: Secondary | ICD-10-CM | POA: Diagnosis not present

## 2019-11-23 DIAGNOSIS — Z9842 Cataract extraction status, left eye: Secondary | ICD-10-CM | POA: Diagnosis not present

## 2019-11-23 DIAGNOSIS — H02132 Senile ectropion of right lower eyelid: Secondary | ICD-10-CM | POA: Diagnosis not present

## 2019-11-23 DIAGNOSIS — H02135 Senile ectropion of left lower eyelid: Secondary | ICD-10-CM | POA: Diagnosis not present

## 2019-11-26 ENCOUNTER — Other Ambulatory Visit: Payer: Self-pay | Admitting: Family Medicine

## 2019-11-26 MED ORDER — TRIAMCINOLONE ACETONIDE 0.1 % EX CREA: 1.0000 "application " | TOPICAL_CREAM | Freq: Two times a day (BID) | CUTANEOUS | 0 refills | Status: DC

## 2019-11-26 MED ORDER — HYDROXYZINE HCL 25 MG PO TABS: ORAL_TABLET | ORAL | 0 refills | Status: DC

## 2019-11-26 MED ORDER — HYDROCODONE-ACETAMINOPHEN 5-325 MG PO TABS: 1.0000 | ORAL_TABLET | Freq: Four times a day (QID) | ORAL | 0 refills | Status: DC | PRN

## 2019-11-26 NOTE — Telephone Encounter (Signed)
Routed to Dr. Dennard Schaumann

## 2019-11-26 NOTE — Telephone Encounter (Signed)
#  CB [phone] Refill Hydroxyzine hcl, Hydrocodone Acetamin, Triamcinolone

## 2019-12-10 ENCOUNTER — Other Ambulatory Visit: Payer: Self-pay | Admitting: Family Medicine

## 2019-12-10 DIAGNOSIS — E785 Hyperlipidemia, unspecified: Secondary | ICD-10-CM

## 2019-12-14 ENCOUNTER — Encounter: Payer: Self-pay | Admitting: Family Medicine

## 2019-12-14 ENCOUNTER — Other Ambulatory Visit: Payer: Self-pay

## 2019-12-14 ENCOUNTER — Ambulatory Visit
Admission: RE | Admit: 2019-12-14 | Discharge: 2019-12-14 | Disposition: A | Discharge status: Home / Self Care | Payer: Medicare HMO | Source: Ambulatory Visit | Attending: Family Medicine | Admitting: Family Medicine

## 2019-12-14 ENCOUNTER — Ambulatory Visit (INDEPENDENT_AMBULATORY_CARE_PROVIDER_SITE_OTHER): Payer: Medicare HMO | Admitting: Family Medicine

## 2019-12-14 VITALS — BP 144/70 | HR 66 | Temp 97.0°F | Ht 74.0 in | Wt 151.0 lb

## 2019-12-14 DIAGNOSIS — E1140 Type 2 diabetes mellitus with diabetic neuropathy, unspecified: Secondary | ICD-10-CM

## 2019-12-14 DIAGNOSIS — R0602 Shortness of breath: Secondary | ICD-10-CM | POA: Diagnosis not present

## 2019-12-14 DIAGNOSIS — Z794 Long term (current) use of insulin: Secondary | ICD-10-CM | POA: Diagnosis not present

## 2019-12-14 DIAGNOSIS — R0600 Dyspnea, unspecified: Secondary | ICD-10-CM

## 2019-12-14 DIAGNOSIS — J449 Chronic obstructive pulmonary disease, unspecified: Secondary | ICD-10-CM

## 2019-12-14 DIAGNOSIS — J984 Other disorders of lung: Secondary | ICD-10-CM | POA: Diagnosis not present

## 2019-12-14 DIAGNOSIS — E7800 Pure hypercholesterolemia, unspecified: Secondary | ICD-10-CM

## 2019-12-14 DIAGNOSIS — R0609 Other forms of dyspnea: Secondary | ICD-10-CM

## 2019-12-14 NOTE — Progress Notes (Signed)
Subjective:    Patient ID: [name], male    DOB: [birth year], 85 y.o.   MRN: [id number]  HPI  Patient states that over the last 2 weeks he feels extremely poorly. He reports shortness of breath with minimal activity. He states that he cannot walk across my parking lot without getting out of breath. He also reports occasional pains in his chest. Around the same time he developed a constant cough. The cough is productive of brown mucus. He reports chest congestion. He reports subjective fevers. He states that he will break out in a sweat and have a fever doing nothing and just sitting around his home. He has a history of coronary artery disease but he also has a history of tobacco abuse and pneumonia. Had stress test 6/20 with EF 60% and no evidence of ischemia.    Patient denies any sore throat or rhinorrhea. He denies any nausea or vomiting or diarrhea. He denies any melena or hematochezia. On examination today he appears frail and underweight. He is pale. He is coughing throughout our exam. He is in no respiratory distress. He reports that his blood sugars are highly variable. He is seeing them as low as 65 and as high as over 200 but typically they are between 160 and 180. Past Medical History:  Diagnosis Date  . Acute venous embolism and thrombosis of unspecified deep vessels of lower extremity   . Atrial flutter (HCC)    and SVT that is post ablation  . CAD (coronary artery disease)   . Chronic kidney disease    stones  . Coronary atherosclerosis 11/19/2008   Qualifier: Diagnosis of  By: Stanford Breed, MD, Kandyce Rud   . Dyslipidemia   . GERD (gastroesophageal reflux disease)   . HTN (hypertension)   . Shortness of breath    on exertion   Past Surgical History:  Procedure Laterality Date  . CARDIAC CATHETERIZATION     s/p cardiac catheterization and bypass surgery as well as EGD with dilation x3, left lower extremity vein stripping  . CORONARY ARTERY BYPASS GRAFT    .  ESOPHAGOGASTRODUODENOSCOPY N/A 12/06/2013   Procedure: ESOPHAGOGASTRODUODENOSCOPY (EGD);  Surgeon: Lear Ng, MD;  Location: [facility];  Service: Endoscopy;  Laterality: N/A;  . left knee anthroscopy    . SECONDARY CLOSURE ARM    . VIDEO BRONCHOSCOPY Bilateral 01/14/2018   Procedure: VIDEO BRONCHOSCOPY WITHOUT FLUORO;  Surgeon: Tanda Rockers, MD;  Location: [facility];  Service: Cardiopulmonary;  Laterality: Bilateral;   Current Outpatient Medications on File Prior to Visit  Medication Sig Dispense Refill  . aspirin 81 MG tablet Take 1 tablet (81 mg total) by mouth daily.    . clobetasol cream (TEMOVATE) 0.05 % Please specify directions, refills and quantity 1 g 0  . clotrimazole (LOTRIMIN) 1 % cream Apply 1 application topically 2 (two) times daily. Apply to the head of the penis 30 g 0  . glucose blood (ONETOUCH ULTRA) test strip USE TO CHECK BLOOD SUGAR 4 TIMES DAILY. E11.9 150 strip 4  . HYDROcodone-acetaminophen (NORCO) 5-325 MG tablet Take 1 tablet by mouth every 6 (six) hours as needed for moderate pain. 30 tablet 0  . hydrOXYzine (ATARAX/VISTARIL) 25 MG tablet TAKE 1 TABLET BY MOUTH 3 TIMES DAILY AS NEEDED FOR ITCHING. 30 tablet 0  . Insulin Syringe-Needle U-100 (BD INSULIN SYRINGE U/F) 31G X 5/16" 0.3 ML MISC USE WITH NOVOLIN TWICE DAY WITH A MEAL 100 each 5  . Lancets [facility]  DELICA PLUS [id number]) MISC USE TO CHECK BLOOD SUAGR 3 TIMES A DAY DX:E11.9 100 each 5  . nitroGLYCERIN (NITROSTAT) 0.4 MG SL tablet Place 0.4 mg under the tongue every 5 (five) minutes as needed for chest pain.    Marland Kitchen NOVOLIN 70/30 (70-30) 100 UNIT/ML injection INJECT 8 UNITS SQ IN THE MORNING AND INJECT 12 UNITS SQ AT BEDTIME 10 mL 11  . pantoprazole (PROTONIX) 40 MG tablet TAKE 1 TABLET BY MOUTH EVERY DAY 90 tablet 3  . pravastatin (PRAVACHOL) 40 MG tablet TAKE 1 TABLET BY MOUTH EVERY DAY 90 tablet 1  . tamsulosin (FLOMAX) 0.4 MG CAPS capsule TAKE 1 CAPSULE BY MOUTH DAILY AFTER SUPPER. 90 capsule  3  . triamcinolone cream (KENALOG) 0.1 % Apply 1 application topically 2 (two) times daily. 453 g 0   No current facility-administered medications on file prior to visit.   Allergies  Allergen Reactions  . Celebrex [Celecoxib] Rash and Other (See Comments)    Blister   Social History   Socioeconomic History  . Marital status: Widowed    Spouse name: Not on file  . Number of children: Not on file  . Years of education: Not on file  . Highest education level: Not on file  Occupational History  . Not on file  Tobacco Use  . Smoking status: Current Every Day Smoker    Packs/day: 1.00    Years: 60.00    Pack years: 60.00    Types: Cigarettes  . Smokeless tobacco: Never Used  Substance and Sexual Activity  . Alcohol use: No  . Drug use: No  . Sexual activity: Not on file  Other Topics Concern  . 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    Social Determinants of Health   Financial Resource Strain:   . Difficulty of Paying Living Expenses:   Food Insecurity:   . Worried About Charity fundraiser in the Last Year:   . Arboriculturist in the Last Year:   Transportation Needs:   . Film/video editor (Medical):   Marland Kitchen Lack of Transportation (Non-Medical):   Physical Activity:   . Days of Exercise per Week:   . Minutes of Exercise per Session:   Stress:   . Feeling of Stress :   Social Connections:   . Frequency of Communication with Friends and Family:   . Frequency of Social Gatherings with Friends and Family:   . Attends Religious Services:   . Active Member of Clubs or Organizations:   . Attends Archivist Meetings:   Marland Kitchen Marital Status:   Intimate Partner Violence:   . Fear of Current or Ex-Partner:   . Emotionally Abused:   Marland Kitchen Physically Abused:   . Sexually Abused:       Review of Systems  All other systems reviewed  and are negative.      Objective:   Physical Exam Vitals reviewed.  Constitutional:      General: He is not in acute distress.    Appearance: He is well-developed. He is not diaphoretic.  Cardiovascular:     Rate and Rhythm: Normal rate and regular rhythm.     Heart sounds: Normal heart sounds. No murmur heard.  No friction rub. No gallop.   Pulmonary:     Effort: Pulmonary effort is normal. No tachypnea, accessory  muscle usage or respiratory distress.     Breath sounds: Examination of the right-upper field reveals decreased breath sounds. Examination of the left-upper field reveals decreased breath sounds. Examination of the right-middle field reveals decreased breath sounds. Examination of the left-middle field reveals decreased breath sounds. Examination of the right-lower field reveals decreased breath sounds. Examination of the left-lower field reveals decreased breath sounds. Decreased breath sounds and wheezing present. No rhonchi or rales.  Chest:     Chest wall: No tenderness.  Abdominal:     General: Bowel sounds are normal. There is no distension.     Palpations: Abdomen is soft. There is no mass.     Tenderness: There is no abdominal tenderness. There is no guarding or rebound.  Musculoskeletal:     Right lower leg: No edema.     Left lower leg: No edema.  Skin:    Findings: No erythema or rash.           Assessment & Plan:  Type 2 diabetes mellitus with diabetic neuropathy, with long-term current use of insulin ([place]) - Plan: CBC with Differential/Platelet, COMPLETE METABOLIC PANEL WITH GFR, Lipid panel, Hemoglobin A1c, CANCELED: Microalbumin, urine  Pure hypercholesterolemia  Shortness of breath - Plan: EKG 12-Lead, DG Chest 2 View  COPD GOLD 0 still smoking   Dyspnea on exertion  Given his clinical appearance, his history, and the normal stress test 1 year ago, I suspect most likely his issues are pulmonary in nature either COPD versus community-acquired  pneumonia versus potentially lung cancer.  Obtain CBC to rule out anemia.  While checking lab work also check a CMP, fasting lipid panel, and hemoglobin A1c.  Given his age, I would be happy with an A1c less than 7.5.  Given the atypical chest pain and dyspnea on exertion, I will obtain an EKG today.  However I want to send the patient immediately for chest x-ray to evaluate further.  Pulmonary exam is significant for diminished breath sounds and expiratory wheezing consistent with emphysema however I do not appreciate any crackles or rails or suggestion of pneumonia on exam.  Patient has a strong history of pneumonia with similar symptoms in the past and therefore I am concerned about a recurrent pulmonary infiltrate.  Unfortunately he continues to smoke.  Compared to EKG from 4/21, EKG today shows no significant changes.  EKG shows normal sinus rhythm with normal intervals and a normal axis.  There is no ST segment changes concerning for ischemia and there are no Q waves concerning for myocardial infarction.  He does have some nonspecific ST segment changes in lead I.

## 2019-12-15 LAB — COMPLETE METABOLIC PANEL WITH GFR
AG Ratio: 1.2 (calc) (ref 1.0–2.5)
ALT: 6 U/L — ABNORMAL LOW (ref 9–46)
AST: 11 U/L (ref 10–35)
Albumin: 3.8 g/dL (ref 3.6–5.1)
Alkaline phosphatase (APISO): 58 U/L (ref 35–144)
BUN/Creatinine Ratio: 17 (calc) (ref 6–22)
BUN: 23 mg/dL (ref 7–25)
CO2: 26 mmol/L (ref 20–32)
Calcium: 9.2 mg/dL (ref 8.6–10.3)
Chloride: 108 mmol/L (ref 98–110)
Creat: 1.33 mg/dL — ABNORMAL HIGH (ref 0.70–1.11)
GFR, Est African American: 57 mL/min/{1.73_m2} — ABNORMAL LOW (ref >=60)
GFR, Est Non African American: 49 mL/min/{1.73_m2} — ABNORMAL LOW (ref >=60)
Globulin: 3.3 g/dL (calc) (ref 1.9–3.7)
Glucose, Bld: 140 mg/dL — ABNORMAL HIGH (ref 65–99)
Potassium: 5.1 mmol/L (ref 3.5–5.3)
Sodium: 140 mmol/L (ref 135–146)
Total Bilirubin: 0.5 mg/dL (ref 0.2–1.2)
Total Protein: 7.1 g/dL (ref 6.1–8.1)

## 2019-12-15 LAB — LIPID PANEL
Cholesterol: 128 mg/dL (ref <200)
HDL: 37 mg/dL — ABNORMAL LOW (ref >=40)
LDL Cholesterol (Calc): 71 mg/dL (calc)
Non-HDL Cholesterol (Calc): 91 mg/dL (calc) (ref <130)
Total CHOL/HDL Ratio: 3.5 (calc) (ref <5.0)
Triglycerides: 117 mg/dL (ref <150)

## 2019-12-15 LAB — CBC WITH DIFFERENTIAL/PLATELET
Absolute Monocytes: 948 cells/uL (ref 200–950)
Basophils Absolute: 73 cells/uL (ref 0–200)
Basophils Relative: 0.9 %
Eosinophils Absolute: 421 cells/uL (ref 15–500)
Eosinophils Relative: 5.2 %
HCT: 37.2 % — ABNORMAL LOW (ref 38.5–50.0)
Hemoglobin: 12.2 g/dL — ABNORMAL LOW (ref 13.2–17.1)
Lymphs Abs: 1879 cells/uL (ref 850–3900)
MCH: 29.8 pg (ref 27.0–33.0)
MCHC: 32.8 g/dL (ref 32.0–36.0)
MCV: 90.7 fL (ref 80.0–100.0)
MPV: 10.1 fL (ref 7.5–12.5)
Monocytes Relative: 11.7 %
Neutro Abs: 4779 cells/uL (ref 1500–7800)
Neutrophils Relative %: 59 %
Platelets: 229 10*3/uL (ref 140–400)
RBC: 4.1 10*6/uL — ABNORMAL LOW (ref 4.20–5.80)
RDW: 14.3 % (ref 11.0–15.0)
Total Lymphocyte: 23.2 %
WBC: 8.1 10*3/uL (ref 3.8–10.8)

## 2019-12-15 LAB — HEMOGLOBIN A1C
Hgb A1c MFr Bld: 6.6 % of total Hgb — ABNORMAL HIGH (ref <5.7)
Mean Plasma Glucose: 143 (calc)
eAG (mmol/L): 7.9 (calc)

## 2019-12-16 ENCOUNTER — Other Ambulatory Visit: Payer: Self-pay | Admitting: *Deleted

## 2019-12-16 ENCOUNTER — Other Ambulatory Visit: Payer: Self-pay | Admitting: Family Medicine

## 2019-12-16 DIAGNOSIS — R911 Solitary pulmonary nodule: Secondary | ICD-10-CM

## 2019-12-24 ENCOUNTER — Telehealth: Payer: Self-pay | Admitting: Family Medicine

## 2019-12-24 NOTE — Telephone Encounter (Signed)
CB# [phone] Refill Hydroxyzine HCL , Hydrocodone

## 2019-12-25 DIAGNOSIS — R69 Illness, unspecified: Secondary | ICD-10-CM | POA: Diagnosis not present

## 2019-12-26 DIAGNOSIS — R69 Illness, unspecified: Secondary | ICD-10-CM | POA: Diagnosis not present

## 2019-12-28 ENCOUNTER — Other Ambulatory Visit: Payer: Self-pay | Admitting: Family Medicine

## 2019-12-28 NOTE — Telephone Encounter (Signed)
Requested Prescriptions   Pending Prescriptions Disp Refills  . HYDROcodone-acetaminophen (NORCO) 5-325 MG tablet 30 tablet 0    Sig: Take 1 tablet by mouth every 6 (six) hours as needed for moderate pain.  . hydrOXYzine (ATARAX/VISTARIL) 25 MG tablet 30 tablet 0    Sig: TAKE 1 TABLET BY MOUTH 3 TIMES DAILY AS NEEDED FOR ITCHING.   Signed Prescriptions Disp Refills  . tamsulosin (FLOMAX) 0.4 MG CAPS capsule 90 capsule 1    Sig: TAKE 1 CAPSULE BY MOUTH DAILY AFTER SUPPER.    Authorizing Provider: Jenna Luo T    Ordering User: Vanice Sarah  . triamcinolone cream (KENALOG) 0.1 % 454 g 0    Sig: APPLY TO AFFECTED AREA TWICE A DAY    Authorizing Provider: Susy Frizzle    Ordering User: Dyke Maes C    Last OV 12/14/2019   Last written 11/26/2019

## 2019-12-28 NOTE — Telephone Encounter (Signed)
Refilled already sent in

## 2019-12-29 ENCOUNTER — Other Ambulatory Visit: Payer: Self-pay

## 2019-12-29 NOTE — Telephone Encounter (Signed)
Last refilled 11/26/19

## 2019-12-30 MED ORDER — HYDROXYZINE HCL 25 MG PO TABS: ORAL_TABLET | ORAL | 0 refills | Status: DC

## 2019-12-30 MED ORDER — HYDROCODONE-ACETAMINOPHEN 5-325 MG PO TABS: 1.0000 | ORAL_TABLET | Freq: Four times a day (QID) | ORAL | 0 refills | Status: DC | PRN

## 2019-12-31 ENCOUNTER — Other Ambulatory Visit: Payer: Self-pay

## 2019-12-31 ENCOUNTER — Ambulatory Visit
Admission: RE | Admit: 2019-12-31 | Discharge: 2019-12-31 | Disposition: A | Discharge status: Home / Self Care | Payer: Medicare HMO | Source: Ambulatory Visit | Attending: Family Medicine | Admitting: Family Medicine

## 2019-12-31 DIAGNOSIS — R911 Solitary pulmonary nodule: Secondary | ICD-10-CM | POA: Diagnosis not present

## 2019-12-31 DIAGNOSIS — I700 Atherosclerosis of aorta: Secondary | ICD-10-CM | POA: Diagnosis not present

## 2019-12-31 DIAGNOSIS — J432 Centrilobular emphysema: Secondary | ICD-10-CM | POA: Diagnosis not present

## 2019-12-31 DIAGNOSIS — J984 Other disorders of lung: Secondary | ICD-10-CM | POA: Diagnosis not present

## 2019-12-31 MED ORDER — IOPAMIDOL (ISOVUE-300) INJECTION 61%
75.0000 mL | Freq: Once | INTRAVENOUS | Status: AC | PRN
  Administered 2019-12-31: 75 mL via INTRAVENOUS

## 2020-01-03 ENCOUNTER — Telehealth: Payer: Self-pay | Admitting: Family Medicine

## 2020-01-03 ENCOUNTER — Other Ambulatory Visit: Payer: Self-pay

## 2020-01-03 MED ORDER — PANTOPRAZOLE SODIUM 40 MG PO TBEC: DELAYED_RELEASE_TABLET | ORAL | 3 refills | Status: DC

## 2020-01-03 NOTE — Telephone Encounter (Signed)
Pt rx refilled 

## 2020-01-03 NOTE — Telephone Encounter (Signed)
Cb# [phone] Refill Pantoprazole sod dr 40 mg tab

## 2020-01-10 ENCOUNTER — Telehealth: Payer: Self-pay | Admitting: Family Medicine

## 2020-01-10 NOTE — Telephone Encounter (Signed)
CB# [phone] Hasn't heard from anyone for a referral to a lung specialist

## 2020-01-24 DIAGNOSIS — R69 Illness, unspecified: Secondary | ICD-10-CM | POA: Diagnosis not present

## 2020-01-25 ENCOUNTER — Encounter: Payer: Self-pay | Admitting: Internal Medicine

## 2020-01-25 ENCOUNTER — Other Ambulatory Visit: Payer: Self-pay

## 2020-01-25 ENCOUNTER — Ambulatory Visit: Payer: Medicare HMO | Admitting: Internal Medicine

## 2020-01-25 DIAGNOSIS — F17210 Nicotine dependence, cigarettes, uncomplicated: Secondary | ICD-10-CM | POA: Diagnosis not present

## 2020-01-25 DIAGNOSIS — R918 Other nonspecific abnormal finding of lung field: Secondary | ICD-10-CM

## 2020-01-25 DIAGNOSIS — R69 Illness, unspecified: Secondary | ICD-10-CM | POA: Diagnosis not present

## 2020-01-25 DIAGNOSIS — J449 Chronic obstructive pulmonary disease, unspecified: Secondary | ICD-10-CM | POA: Diagnosis not present

## 2020-01-25 MED ORDER — CIPROFLOXACIN HCL 500 MG PO TABS
500.0000 mg | ORAL_TABLET | Freq: Two times a day (BID) | ORAL | 0 refills | Status: DC
Start: 2020-01-25 — End: 2020-06-19

## 2020-01-25 NOTE — Assessment & Plan Note (Addendum)
[facility] spring 2019 [facility] with documented aspiration on DgEs 08/2017 in pt with poor cough mechanics  - cxr changes 12/11/17 no better 01/08/2018 with "mucus plugs" by CT chest 12/23/17 in active smoker - REC fob 01/14/18 > copious retained mucus> tbbx c/w organizing pna/ neg cyt/ neg gm stain / few pseudomonas sensitive to cipro on culture so rec on 01/19/18 start  750 bid x 10 days then ov in 2 weeks with cxr> marked improvement on cxr 03/05/2018  -- CT 12/31/19 new RUL/apical  partially cavitary nodule  - 01/25/2020 rx cipro 500 mg bid x 10 days then cxr at 2 weeks and if not change then PET next   New  nodule with cavitary changes ?  infection related to recurrent aspiration vs tumor in pt not a great surgical candidate so rec > repeat cipro 500 mg bid x 10 days then cxr in 2 weeks then proceed with PET scan once infection issue addressed.  Discussed in detail all the  indications, usual  risks and alternatives  relative to the benefits with patient/Amanda who agree  to proceed with w/u as outlined.

## 2020-01-25 NOTE — Assessment & Plan Note (Addendum)
Active smoker  - Spirometry 06/03/2018  FEV1 2.6 (77%)  Ratio 72 with atypical curvature - 06/03/2018   Walked RA  2 laps @ 232ft each @ slow to avg pace  stopped due to end of study, minimal sob  -  01/25/2020   Walked RA  2 laps @ approx 250ft each @ moderate pace  stopped due to end of study,  no sob - sats still 96% at end   Doubt there is signicant copd now vs prior study and he declines to take any meds anyway.  Main rec is to stop smoking (see separate a/p)

## 2020-01-25 NOTE — Patient Instructions (Signed)
cipro 500 mg twice daily x 10 days   Cxr here in 2 weeks and I will call with recommendations (PET scan)

## 2020-01-25 NOTE — Progress Notes (Signed)
[name], male    DOB: 1935/06/01, 85 y.o.   MRN: [id number]    Brief patient profile:  34 yowm active smoker with new onset sob/cough x 2014 worse since April 2019  Assoc with chronic dysphagia with documented mild silent aspiration by [facility] 08/29/17 and not able to swallow barium tablet.  Referred 01/08/2018 by Dr Dennard Schaumann for eval infiltrates on cxr RLL ant basal segment first noted 12/11/17 with suggestion of mucus plugging by CT chest 12/24/17    History of Present Illness  01/08/2018 1st pulmonary office eval/ Jaman Aro Chief Complaint  Patient presents with  . Pulmonary Consult    Referred by Dr. Dennard Schaumann. Pt states he has had recurrent PNA since April 2019. He states he always feels SOB with or without exertion. He is using albuterol nebs 2 x daily.    Dyspnea: uphill when walks back from mailbox= MMRC1 = can walk nl pace, flat grade, can't hurry or go uphills or steps s sob    Cough: rattling / worse since April  2019 some better with abx transiently each time  / min mucoid sputum esp in am  Sleep: sleeps prone SABA use: not sure they help  Chronic dysphagia, lost about 10 lb since onset of coughing April 2019  Already eval by ST and Gdaughter says he's been following instructions but not helping swallowing rec The key is to stop smoking completely before smoking completely stops you!  Best cough medication mucinex 1200 mg every 12 hours as needed      02/02/18 NP rec Repeat chest xray in 3 weeks  FU visit in 1 month with Dr. Melvyn Novas Continue liquid mucinex Add Ensure shakes twice a day     03/05/2018  f/u ov/Jamauri Kruzel re: cough /dysphagia  Chief Complaint  Patient presents with  . Follow-up    Breathing has improved. He has occ cough with white sputum. He has not needed his neb or combivent inhaler.  Dyspnea:  MMRC1 = can walk nl pace, flat grade, can't hurry or go uphills or steps s sob  Improved mb to house  Cough: better / some white in am/ still occ choking on food  Sleeping:   Lies flat/ one pillow SABA use: none used - has combivent not using  02: no  rec Increase pantoprazole to 40 mg Take 30- 60 min before your first and last meals of the day  GERDrx  If throat not some better after 4 weeks on this plan then call me for referral to ENT        06/03/2018  f/u ov/Elvert Cumpton re: chronic cough/ sob /  Chief Complaint  Patient presents with  . Follow-up    Pt states "I stay SOB all the time". He has not used his combivent or neb recently.    Dyspnea:  Able to walk mb and back s stopping, slow pace at grocery store and yet feels continually sob at rest sitting (not apparent  at ov)  Cough: cough  Is rattling/ congested worse in am and sometimes p meals  Sleeping: lie flat in bed ok  SABA use: none 02: none   rec F/u prn    01/25/2020  f/u ov/Ashleyann Shoun re:  GOLD 0 copd/ still smoking / consult Dr Dennard Schaumann re RUL nodule  Chief Complaint  Patient presents with  . Consult    Referred  by Dr. Dennard Schaumann-  concerned about recent CT Chest done 12/31/19- nodule. He states breathing has been worse since the last  visit. He has occ cough- sometimes prod with white to yellow sputum.   Dyspnea:  MMRC3 = can't walk 100 yards even at a slow pace at a flat grade s stopping due to sob   Cough: some more than usual x sev months / never bloody but sometime yellow  Sleeping: ok flat in bed  SABA use: none  02: none    No obvious day to day or daytime variability or assoc  mucus plugs or hemoptysis or cp or chest tightness, subjective wheeze or overt  hb symptoms.   Sleeping as above  without nocturnal  or early am exacerbation  of respiratory  c/o's or need for noct saba. Also denies any obvious fluctuation of symptoms with weather or environmental changes or other aggravating or alleviating factors except as outlined above   No unusual exposure hx or h/o childhood pna/ asthma or knowledge of premature birth.  Current Allergies, Complete Past Medical History, Past Surgical History,  Family History, and Social History were reviewed in [employer] record.  ROS  The following are not active complaints unless bolded Hoarseness, sore throat, dysphagia, dental problems, itching, sneezing,  nasal congestion or discharge of excess mucus or purulent secretions, ear ache,   fever, chills, sweats, unintended wt loss or wt gain, classically pleuritic or exertional cp,  orthopnea pnd or arm/hand swelling  or leg swelling, presyncope, palpitations, abdominal pain, anorexia, nausea, vomiting, diarrhea  or change in bowel habits or change in bladder habits, change in stools or change in urine, dysuria, hematuria,  rash, arthralgias, visual complaints, headache, numbness, weakness or ataxia or problems with walking or coordination,  change in mood or  memory.        Current Meds  Medication Sig  . aspirin 81 MG tablet Take 1 tablet (81 mg total) by mouth daily.  . clobetasol cream (TEMOVATE) 0.05 % Please specify directions, refills and quantity  . clotrimazole (LOTRIMIN) 1 % cream Apply 1 application topically 2 (two) times daily. Apply to the head of the penis  . glucose blood (ONETOUCH ULTRA) test strip USE TO CHECK BLOOD SUGAR 4 TIMES DAILY. E11.9  . HYDROcodone-acetaminophen (NORCO) 5-325 MG tablet Take 1 tablet by mouth every 6 (six) hours as needed for moderate pain.  . hydrOXYzine (ATARAX/VISTARIL) 25 MG tablet TAKE 1 TABLET BY MOUTH 3 TIMES DAILY AS NEEDED FOR ITCHING.  . Insulin Syringe-Needle U-100 (BD INSULIN SYRINGE U/F) 31G X 5/16" 0.3 ML MISC USE WITH NOVOLIN TWICE DAY WITH A MEAL  . Lancets (ONETOUCH DELICA PLUS [id number]) MISC USE TO CHECK BLOOD SUAGR 3 TIMES A DAY DX:E11.9  . nitroGLYCERIN (NITROSTAT) 0.4 MG SL tablet Place 0.4 mg under the tongue every 5 (five) minutes as needed for chest pain.  Marland Kitchen NOVOLIN 70/30 (70-30) 100 UNIT/ML injection INJECT 8 UNITS SQ IN THE MORNING AND INJECT 12 UNITS SQ AT BEDTIME  . pantoprazole (PROTONIX) 40 MG tablet TAKE 1  TABLET BY MOUTH EVERY DAY  . pravastatin (PRAVACHOL) 40 MG tablet TAKE 1 TABLET BY MOUTH EVERY DAY  . tamsulosin (FLOMAX) 0.4 MG CAPS capsule TAKE 1 CAPSULE BY MOUTH DAILY AFTER SUPPER.  Marland Kitchen triamcinolone cream (KENALOG) 0.1 % APPLY TO AFFECTED AREA TWICE A DAY                  Objective:      Elderly wm extremely hard of hearing, lets his grand daughter do all the talking    01/25/2020  152  06/03/2018      138   03/05/2018       143   01/08/18 138 lb (62.6 kg)  12/23/17 144 lb (65.3 kg)  12/16/17 142 lb (64.4 kg)    Vital signs reviewed  01/25/2020  - Note at rest 02 sats  100% on RA    HEENT : pt wearing mask not removed for exam due to covid - 19 concerns.    NECK :  without JVD/Nodes/TM/ nl carotid upstrokes bilaterally   LUNGS: no acc muscle use,  Mild barrel  contour chest wall with bilateral  Distant bs s audible wheeze and  without cough on insp or exp maneuvers  and mild  Hyperresonant  to  percussion bilaterally     CV:  RRR  no s3 or murmur or increase in P2, and no edema   ABD:  soft and nontender with pos end  insp Hoover's  in the supine position. No bruits or organomegaly appreciated, bowel sounds nl  MS:   Nl gait/  ext warm without deformities, calf tenderness, cyanosis or clubbing No obvious joint restrictions   SKIN: warm and dry without lesions    NEURO:  alert, approp, nl sensorium with  no motor or cerebellar deficits apparent.         I personally reviewed images and agree with radiology impression as follows:   Chest CT w contrast  12/31/19 Density seen on prior chest x-ray corresponds to a cavitary nodule in the right upper lobe measuring up to 1.8 cm. This is nonspecific and could be infectious or neoplastic. Recommend further evaluation with PET CT.  Several scattered borderline sized mediastinal and bilateral hilar lymph nodes, the largest in the right hilum measuring 10 mm. Recommend attention on subsequent imaging.      Assessment

## 2020-01-25 NOTE — Assessment & Plan Note (Addendum)
Counseled re importance of smoking cessation but did not meet time criteria for separate billing            Each maintenance medication was reviewed in detail including emphasizing most importantly the difference between maintenance and prns and under what circumstances the prns are to be triggered using an action plan format where appropriate.  Total time for H and P, chart review, counseling,  directly observing portions of ambulatory 02 saturation study/ and generating customized AVS unique to this office visit / charting = 58 min

## 2020-01-26 ENCOUNTER — Telehealth: Payer: Self-pay | Admitting: Family Medicine

## 2020-01-26 NOTE — Telephone Encounter (Signed)
CB# [phone] Refill Hydroxyzine,Clotrimazole 1% Topical Cream

## 2020-01-27 ENCOUNTER — Other Ambulatory Visit: Payer: Self-pay | Admitting: Family Medicine

## 2020-01-27 MED ORDER — CLOTRIMAZOLE 1 % EX CREA: 1.0000 "application " | TOPICAL_CREAM | Freq: Two times a day (BID) | CUTANEOUS | 3 refills | Status: DC

## 2020-01-27 MED ORDER — HYDROXYZINE HCL 25 MG PO TABS: ORAL_TABLET | ORAL | 3 refills | Status: DC

## 2020-01-27 NOTE — Telephone Encounter (Signed)
Prescription sent to pharmacy.

## 2020-01-27 NOTE — Telephone Encounter (Signed)
Medication Pt requested has been sent in

## 2020-01-27 NOTE — Telephone Encounter (Signed)
Pt needs refills on hydrOXYzine (ATARAX/VISTARIL) 25 MG tablet,  clotrimazole (LOTRIMIN) 1 % cream  Sent to [id number] [place], [place] - [address] AT [place]   Pt call back [phone]

## 2020-01-28 DIAGNOSIS — E11620 Type 2 diabetes mellitus with diabetic dermatitis: Secondary | ICD-10-CM | POA: Diagnosis not present

## 2020-01-28 DIAGNOSIS — I25119 Atherosclerotic heart disease of native coronary artery with unspecified angina pectoris: Secondary | ICD-10-CM | POA: Diagnosis not present

## 2020-01-28 DIAGNOSIS — R69 Illness, unspecified: Secondary | ICD-10-CM | POA: Diagnosis not present

## 2020-01-28 DIAGNOSIS — Z794 Long term (current) use of insulin: Secondary | ICD-10-CM | POA: Diagnosis not present

## 2020-01-28 DIAGNOSIS — E1122 Type 2 diabetes mellitus with diabetic chronic kidney disease: Secondary | ICD-10-CM | POA: Diagnosis not present

## 2020-01-28 DIAGNOSIS — E1140 Type 2 diabetes mellitus with diabetic neuropathy, unspecified: Secondary | ICD-10-CM | POA: Diagnosis not present

## 2020-01-28 DIAGNOSIS — E785 Hyperlipidemia, unspecified: Secondary | ICD-10-CM | POA: Diagnosis not present

## 2020-01-28 DIAGNOSIS — J439 Emphysema, unspecified: Secondary | ICD-10-CM | POA: Diagnosis not present

## 2020-01-28 DIAGNOSIS — I4892 Unspecified atrial flutter: Secondary | ICD-10-CM | POA: Diagnosis not present

## 2020-02-08 ENCOUNTER — Other Ambulatory Visit: Payer: Self-pay

## 2020-02-08 ENCOUNTER — Ambulatory Visit (INDEPENDENT_AMBULATORY_CARE_PROVIDER_SITE_OTHER): Payer: Medicare HMO

## 2020-02-08 ENCOUNTER — Other Ambulatory Visit: Payer: Self-pay | Admitting: Internal Medicine

## 2020-02-08 DIAGNOSIS — R918 Other nonspecific abnormal finding of lung field: Secondary | ICD-10-CM

## 2020-02-08 DIAGNOSIS — R911 Solitary pulmonary nodule: Secondary | ICD-10-CM | POA: Diagnosis not present

## 2020-02-08 DIAGNOSIS — J439 Emphysema, unspecified: Secondary | ICD-10-CM | POA: Diagnosis not present

## 2020-02-09 ENCOUNTER — Other Ambulatory Visit: Payer: Self-pay

## 2020-02-09 DIAGNOSIS — R911 Solitary pulmonary nodule: Secondary | ICD-10-CM

## 2020-02-09 NOTE — Progress Notes (Signed)
Called and spoke with Allegra Grana (grand daughter) per [facility] about xray result per Dr Melvyn Novas. PET ordered per Dr Melvyn Novas and [name] made aware. All questions answered and she expressed full understanding. Nothing further needed at this time.

## 2020-02-11 ENCOUNTER — Other Ambulatory Visit: Payer: Self-pay | Admitting: Family Medicine

## 2020-02-11 MED ORDER — HYDROCODONE-ACETAMINOPHEN 5-325 MG PO TABS: 1.0000 | ORAL_TABLET | Freq: Four times a day (QID) | ORAL | 0 refills | Status: DC | PRN

## 2020-02-11 MED ORDER — HYDROXYZINE HCL 25 MG PO TABS: ORAL_TABLET | ORAL | 3 refills | Status: DC

## 2020-02-11 NOTE — Telephone Encounter (Signed)
CB# [phone] Refill Hydrocodone, Hydroxyzine

## 2020-02-11 NOTE — Telephone Encounter (Signed)
Ok to refill Hydrocodone/APAP??  Last office visit 12/14/2019.  Last refill 12/30/2019.

## 2020-02-18 ENCOUNTER — Other Ambulatory Visit: Payer: Self-pay

## 2020-02-18 ENCOUNTER — Encounter (HOSPITAL_COMMUNITY)
Admission: RE | Admit: 2020-02-18 | Discharge: 2020-02-18 | Disposition: A | Discharge status: Home / Self Care | Payer: Medicare HMO | Source: Ambulatory Visit | Attending: Internal Medicine | Admitting: Internal Medicine

## 2020-02-18 ENCOUNTER — Telehealth: Payer: Self-pay | Admitting: Internal Medicine

## 2020-02-18 DIAGNOSIS — R911 Solitary pulmonary nodule: Secondary | ICD-10-CM | POA: Diagnosis not present

## 2020-02-18 DIAGNOSIS — N2889 Other specified disorders of kidney and ureter: Secondary | ICD-10-CM | POA: Diagnosis not present

## 2020-02-18 DIAGNOSIS — J439 Emphysema, unspecified: Secondary | ICD-10-CM | POA: Diagnosis not present

## 2020-02-18 DIAGNOSIS — J984 Other disorders of lung: Secondary | ICD-10-CM | POA: Diagnosis not present

## 2020-02-18 DIAGNOSIS — I700 Atherosclerosis of aorta: Secondary | ICD-10-CM | POA: Insufficient documentation

## 2020-02-18 LAB — GLUCOSE, CAPILLARY: Glucose-Capillary: 103 mg/dL — ABNORMAL HIGH (ref 70–99)

## 2020-02-18 MED ORDER — FLUDEOXYGLUCOSE F - 18 (FDG) INJECTION
7.6000 | Freq: Once | INTRAVENOUS | Status: AC | PRN
  Administered 2020-02-18: 7.6 via INTRAVENOUS

## 2020-02-18 NOTE — Telephone Encounter (Signed)
Noted, see result note

## 2020-02-18 NOTE — Telephone Encounter (Signed)
Patient had a PET scan today. Below are the results:   Narrative & Impression  CLINICAL DATA:  Initial treatment strategy for pulmonary nodule.  EXAM: NUCLEAR MEDICINE PET SKULL BASE TO THIGH  TECHNIQUE: 7.6 mCi F-18 FDG was injected intravenously. Full-ring PET imaging was performed from the skull base to thigh after the radiotracer. CT data was obtained and used for attenuation correction and anatomic localization.  Fasting blood glucose: 103 mg/dl  COMPARISON:  CT chest December 31, 2019 and abdominal imaging from 2016  FINDINGS: Mediastinal blood pool activity: SUV max 2.51  Liver activity: SUV max NA  NECK: Marked hypermetabolic activity noted in the epiglottis or vallecula without definable lesion but with questionable thickening at the base of the epiglottis. (SUVmax = 4.01) no hypermetabolic lymph nodes in the neck.  Incidental CT findings: Extensive atheromatous plaque within the carotid arteries.  CHEST: Cavitary focus in the RIGHT upper lobe (image 65, series 4) 1.6 x 1.0 cm showing nodular features and cavitation as on the previous study. Extending inferiorly there are bandlike changes along bronchovascular structures and extending towards the pleural surface.  (Image 85, series 4) previously identified RIGHT hilar lymph node approximately 11 mm (SUVmax = 4.8)  Smaller lymph nodes along RIGHT lower lobe bronchi with similar appearance grossly when compared to the July 9th CT also show some hypermetabolic activity (image 99, series 4) 7 mm lymph node in this location with maximum SUV of 4.0. Smaller lymph nodes show similar uptake tracking towards the RIGHT hilum. Mild prominence of subcarinal nodal tissue also with similar mild increased FDG uptake.  Increased FDG uptake throughout the RIGHT lower lobe posteriorly in a medially in areas subtended by bronchi which are thickened and show material within the bronchial lumen. Tree-in-bud opacities  are seen peripherally increased since the previous exam.  Incidental CT findings: Generalized atherosclerosis. Signs of CABG and coronary artery disease. No pericardial effusion. Central pulmonary vasculature is stable caliber. Esophagus is patulous.  ABDOMEN/PELVIS: No abnormal hypermetabolic activity within the liver, pancreas, adrenal glands, or spleen. No hypermetabolic lymph nodes in the abdomen or pelvis.  Incidental CT findings: Noncontrast appearance of liver, gallbladder, spleen, adrenals is unremarkable.  Pancreas with pancreatic atrophy.  6 mm LEFT intrarenal calculus. This is in the interpolar aspect of the LEFT kidney.  Mild to moderate RIGHT renal pelvic distension in infundibular distension with nonobstructing RIGHT intrarenal calculi. No perinephric stranding. Some distension of the RIGHT renal pelvis and LEFT renal pelvis on previous imaging studies dating back to 2016.  Extensive aortic atherosclerosis. No aneurysm. No adenopathy in the pelvis or in the retroperitoneum.  Stomach is under distended. Signs of colonic diverticulosis. No acute bowel process.  SKELETON: No acute musculoskeletal process. Mild stranding in the gluteal folds and medial gluteal region bilaterally overlying the ischium and inferior pubic rami, associated with mild increased FDG uptake.  Mild uptake about the LEFT shoulder likely related to arthropathy. No lesion to indicate suspicious, metastatic bone process.  Incidental CT findings: Spinal degenerative changes. Changes of median sternotomy.  IMPRESSION: 1. Mild FDG uptake associated with cavitary lesion in the RIGHT upper lobe. Infection or neoplasm could have this appearance. Bronchogenic neoplasm remains in the differential with hypermetabolic RIGHT hilar nodal tissue. 2. RIGHT lower lobe process including small lymph nodes along the bronchovascular structures compatible with infectious or inflammatory changes,  associated with tree-in-bud opacities in the RIGHT lower lobe. The extent to which nodal disease in the chest is related to potential neoplasm or inflammation is unclear.  Would also correlate with any respiratory symptoms or risk factors for aspiration. 3. Vallecular activity with suggestion of subtle soft tissue thickening at the base of the epiglottis. While there is a possibility this could be physiologic this is anti dependent on the current study. Direct visualization may be helpful to exclude mucosal lesion. 4. Mild distension of the RIGHT renal pelvis and collecting systems without clear cause. Renal pelvic distension with seen also in 2016 distension of the renal pelvis on the RIGHT is slightly worse on today's study. Consider ultrasound follow-up as an initial means of further evaluating this finding and assessing for stability and or worsening.  These results will be called to the ordering clinician or representative by the Radiologist Assistant, and communication documented in the PACS or [employer].  Aortic Atherosclerosis (ICD10-I70.0) and Emphysema (ICD10-J43.9).   Electronically Signed   By: Zetta Bills M.D.   On: 02/18/2020 10:39    MW, please advise. Thanks!

## 2020-02-20 DIAGNOSIS — R69 Illness, unspecified: Secondary | ICD-10-CM | POA: Diagnosis not present

## 2020-02-21 NOTE — Progress Notes (Signed)
Dr Melvyn Novas- I want to let you know Dr Juline Patch next opening is not until 04/03/20- is this appropriate if pt decides this is the route he wants to go?

## 2020-02-22 ENCOUNTER — Other Ambulatory Visit: Payer: Self-pay | Admitting: *Deleted

## 2020-02-22 DIAGNOSIS — R911 Solitary pulmonary nodule: Secondary | ICD-10-CM

## 2020-02-22 DIAGNOSIS — R918 Other nonspecific abnormal finding of lung field: Secondary | ICD-10-CM

## 2020-02-22 NOTE — Progress Notes (Signed)
I called and spoke with pt's grand daughter [name] and she states that the pt has decided to proceed with having another CT Chest in 3 months rather than seeing Dr Elsworth Soho. Will forward to Dr Melvyn Novas to let him know. Went ahead and placed the CT order.

## 2020-02-22 NOTE — Progress Notes (Signed)
See last entry

## 2020-03-06 ENCOUNTER — Ambulatory Visit: Payer: Medicare HMO | Admitting: Internal Medicine

## 2020-03-09 DIAGNOSIS — R69 Illness, unspecified: Secondary | ICD-10-CM | POA: Diagnosis not present

## 2020-03-13 ENCOUNTER — Other Ambulatory Visit: Payer: Self-pay | Admitting: Family Medicine

## 2020-03-13 MED ORDER — HYDROCODONE-ACETAMINOPHEN 5-325 MG PO TABS: 1.0000 | ORAL_TABLET | Freq: Four times a day (QID) | ORAL | 0 refills | Status: DC | PRN

## 2020-03-13 MED ORDER — TRIAMCINOLONE ACETONIDE 0.1 % EX CREA: TOPICAL_CREAM | CUTANEOUS | 3 refills | Status: DC

## 2020-03-13 NOTE — Telephone Encounter (Signed)
Ok to refill??  Last office visit 12/14/2019.  Last refill 02/11/2020.

## 2020-03-13 NOTE — Telephone Encounter (Signed)
CB# [phone] Refill Hydrocodone, Triamcinolone Cream

## 2020-03-14 ENCOUNTER — Other Ambulatory Visit: Payer: Self-pay | Admitting: Family Medicine

## 2020-03-23 ENCOUNTER — Other Ambulatory Visit: Payer: Self-pay | Admitting: Family Medicine

## 2020-03-27 ENCOUNTER — Other Ambulatory Visit: Payer: Self-pay | Admitting: Family Medicine

## 2020-03-27 DIAGNOSIS — E785 Hyperlipidemia, unspecified: Secondary | ICD-10-CM

## 2020-03-29 ENCOUNTER — Other Ambulatory Visit: Payer: Self-pay

## 2020-03-29 ENCOUNTER — Other Ambulatory Visit: Payer: Self-pay | Admitting: Family Medicine

## 2020-03-29 DIAGNOSIS — R69 Illness, unspecified: Secondary | ICD-10-CM | POA: Diagnosis not present

## 2020-03-29 MED ORDER — ONETOUCH ULTRA VI STRP: ORAL_STRIP | 9 refills | Status: DC

## 2020-03-29 NOTE — Telephone Encounter (Signed)
Refill One [employer]

## 2020-03-29 NOTE — Telephone Encounter (Signed)
Prescription sent to pharmacy.

## 2020-04-21 DIAGNOSIS — R69 Illness, unspecified: Secondary | ICD-10-CM | POA: Diagnosis not present

## 2020-04-27 ENCOUNTER — Ambulatory Visit: Payer: Medicare HMO | Attending: Internal Medicine

## 2020-04-27 DIAGNOSIS — Z23 Encounter for immunization: Secondary | ICD-10-CM

## 2020-04-27 NOTE — Progress Notes (Signed)
   Covid-19 Vaccination Clinic  Name:  [name]    MRN: [id number] DOB: May 07, 1935  04/27/2020  [name] was observed post Covid-19 immunization for 15 minutes without incident. He was provided with Vaccine Information Sheet and instruction to access the V-Safe system.   [name] was instructed to call 911 with any severe reactions post vaccine: Marland Kitchen Difficulty breathing  . Swelling of face and throat  . A fast heartbeat  . A bad rash all over body  . Dizziness and weakness

## 2020-05-02 ENCOUNTER — Other Ambulatory Visit: Payer: Self-pay | Admitting: Family Medicine

## 2020-05-02 ENCOUNTER — Telehealth: Payer: Self-pay | Admitting: Family Medicine

## 2020-05-02 MED ORDER — HYDROCODONE-ACETAMINOPHEN 5-325 MG PO TABS: 1.0000 | ORAL_TABLET | Freq: Four times a day (QID) | ORAL | 0 refills | Status: DC | PRN

## 2020-05-02 MED ORDER — HYDROXYZINE HCL 25 MG PO TABS: ORAL_TABLET | ORAL | 3 refills | Status: DC

## 2020-05-02 NOTE — Telephone Encounter (Signed)
Refill Hydrocodone,Hydroxyzine

## 2020-05-02 NOTE — Telephone Encounter (Signed)
Please Advise

## 2020-05-08 ENCOUNTER — Other Ambulatory Visit: Payer: Self-pay

## 2020-05-08 ENCOUNTER — Ambulatory Visit (INDEPENDENT_AMBULATORY_CARE_PROVIDER_SITE_OTHER): Payer: Medicare HMO | Admitting: Family Medicine

## 2020-05-08 ENCOUNTER — Other Ambulatory Visit: Payer: Medicare HMO

## 2020-05-08 VITALS — BP 140/64 | HR 77 | Temp 97.7°F | Ht 74.0 in | Wt 146.0 lb

## 2020-05-08 DIAGNOSIS — R0609 Other forms of dyspnea: Secondary | ICD-10-CM

## 2020-05-08 DIAGNOSIS — E1140 Type 2 diabetes mellitus with diabetic neuropathy, unspecified: Secondary | ICD-10-CM | POA: Diagnosis not present

## 2020-05-08 DIAGNOSIS — J449 Chronic obstructive pulmonary disease, unspecified: Secondary | ICD-10-CM

## 2020-05-08 DIAGNOSIS — R0600 Dyspnea, unspecified: Secondary | ICD-10-CM

## 2020-05-08 DIAGNOSIS — Z0000 Encounter for general adult medical examination without abnormal findings: Secondary | ICD-10-CM

## 2020-05-08 DIAGNOSIS — R911 Solitary pulmonary nodule: Secondary | ICD-10-CM | POA: Diagnosis not present

## 2020-05-08 DIAGNOSIS — Z794 Long term (current) use of insulin: Secondary | ICD-10-CM | POA: Diagnosis not present

## 2020-05-08 DIAGNOSIS — E785 Hyperlipidemia, unspecified: Secondary | ICD-10-CM

## 2020-05-08 DIAGNOSIS — Z0001 Encounter for general adult medical examination with abnormal findings: Secondary | ICD-10-CM | POA: Diagnosis not present

## 2020-05-08 DIAGNOSIS — I6523 Occlusion and stenosis of bilateral carotid arteries: Secondary | ICD-10-CM | POA: Diagnosis not present

## 2020-05-08 NOTE — Progress Notes (Signed)
Subjective:    Patient ID: [name], male    DOB: 09-18-1934, 85 y.o.   MRN: [id number]  HPI   Patient is a pleasant 85 year old Caucasian male who is here today for annual wellness visit.  He recently had a PET scan of his lungs/body in August due to a lesion seen in his lungs.  The results are included below:   IMPRESSION: 1. Mild FDG uptake associated with cavitary lesion in the RIGHT upper lobe. Infection or neoplasm could have this appearance. Bronchogenic neoplasm remains in the differential with hypermetabolic RIGHT hilar nodal tissue. 2. RIGHT lower lobe process including small lymph nodes along the bronchovascular structures compatible with infectious or inflammatory changes, associated with tree-in-bud opacities in the RIGHT lower lobe. The extent to which nodal disease in the chest is related to potential neoplasm or inflammation is unclear. Would also correlate with any respiratory symptoms or risk factors for aspiration. 3. Vallecular activity with suggestion of subtle soft tissue thickening at the base of the epiglottis. While there is a possibility this could be physiologic this is anti dependent on the current study. Direct visualization may be helpful to exclude mucosal lesion. 4. Mild distension of the RIGHT renal pelvis and collecting systems without clear cause. Renal pelvic distension with seen also in 2016 distension of the renal pelvis on the RIGHT is slightly worse on today's study. Consider ultrasound follow-up as an initial means of further evaluating this finding and assessing for stability and or Worsening.  After discussion with his pulmonologist they have decided to repeat a CAT scan of the lungs in 3 months to monitor for stability rather than proceeding with a biopsy.  This should be occurring in the next month or so.  He does report dyspnea on exertion.  He reports a cough throughout the day.  He denies any hemoptysis.  He denies any fevers  or chills.  Pulse oximetry is 92% on room air.  He has never had an overnight pulse oximetry.  He denies any falls.  He denies any depression.  He denies any memory loss.  He does report feeling weak and tired all throughout the day.  He also reports nocturia 2 times every evening.  Prostate exam was performed today and is normal with no significant swelling or nodularity or tenderness.  Patient had carotid Dopplers in March that showed less than 60% blockage in the carotid artery.  He is on a statin and aspirin.  Unfortunately he continues to smoke 1 pack of cigarettes per day. Immunization History  Administered Date(s) Administered  . Fluad Quad(high Dose 65+) 03/31/2019  . Influenza, High Dose Seasonal PF 03/31/2017, 03/12/2018  . Influenza,inj,Quad PF,6+ Mos 05/03/2016  . PFIZER SARS-COV-2 Vaccination 07/30/2019, 08/24/2019, 04/27/2020  . Pneumococcal Polysaccharide-23 05/02/2016  . Tdap 04/24/2015   Patient has also had a shingles vaccine as well as his flu shot. Past Medical History:  Diagnosis Date  . Acute venous embolism and thrombosis of unspecified deep vessels of lower extremity   . Atrial flutter (HCC)    and SVT that is post ablation  . CAD (coronary artery disease)   . Chronic kidney disease    stones  . Coronary atherosclerosis 11/19/2008   Qualifier: Diagnosis of  By: Stanford Breed, MD, Kandyce Rud   . Dyslipidemia   . GERD (gastroesophageal reflux disease)   . HTN (hypertension)   . Shortness of breath    on exertion   Past Surgical History:  Procedure Laterality Date  .  CARDIAC CATHETERIZATION     s/p cardiac catheterization and bypass surgery as well as EGD with dilation x3, left lower extremity vein stripping  . CORONARY ARTERY BYPASS GRAFT    . ESOPHAGOGASTRODUODENOSCOPY N/A 12/06/2013   Procedure: ESOPHAGOGASTRODUODENOSCOPY (EGD);  Surgeon: Lear Ng, MD;  Location: [facility];  Service: Endoscopy;  Laterality: N/A;  . left knee anthroscopy    .  SECONDARY CLOSURE ARM    . VIDEO BRONCHOSCOPY Bilateral 01/14/2018   Procedure: VIDEO BRONCHOSCOPY WITHOUT FLUORO;  Surgeon: Tanda Rockers, MD;  Location: [facility];  Service: Cardiopulmonary;  Laterality: Bilateral;   Current Outpatient Medications on File Prior to Visit  Medication Sig Dispense Refill  . aspirin 81 MG tablet Take 1 tablet (81 mg total) by mouth daily.    . ciprofloxacin (CIPRO) 500 MG tablet Take 1 tablet (500 mg total) by mouth 2 (two) times daily. 20 tablet 0  . clobetasol cream (TEMOVATE) 0.05 % Please specify directions, refills and quantity 1 g 0  . clotrimazole (LOTRIMIN) 1 % cream Apply 1 application topically 2 (two) times daily. Apply to the head of the penis 30 g 3  . glucose blood (ONETOUCH ULTRA) test strip USE TO CHECK BLOOD SUGAR 4  TIMES DAILY. E11.9 100 strip 9  . HYDROcodone-acetaminophen (NORCO) 5-325 MG tablet Take 1 tablet by mouth every 6 (six) hours as needed for moderate pain. 30 tablet 0  . hydrOXYzine (ATARAX/VISTARIL) 25 MG tablet TAKE 1 TABLET BY MOUTH 3 TIMES DAILY AS NEEDED FOR ITCHING. 30 tablet 3  . Insulin Syringe-Needle U-100 (BD INSULIN SYRINGE U/F) 31G X 5/16" 0.3 ML MISC USE WITH NOVOLIN TWICE DAY WITH A MEAL 100 each 5  . Lancets (ONETOUCH DELICA PLUS [id number]) MISC USE TO CHECK BLOOD SUAGR 3 TIMES A DAY DX:E11.9 100 each 5  . nitroGLYCERIN (NITROSTAT) 0.4 MG SL tablet Place 0.4 mg under the tongue every 5 (five) minutes as needed for chest pain.    Marland Kitchen NOVOLIN 70/30 (70-30) 100 UNIT/ML injection INJECT 8 UNITS SQ IN THE MORNING AND INJECT 12 UNITS SQ AT BEDTIME 10 mL 11  . pantoprazole (PROTONIX) 40 MG tablet TAKE 1 TABLET BY MOUTH EVERY DAY 90 tablet 3  . pravastatin (PRAVACHOL) 40 MG tablet TAKE 1 TABLET BY MOUTH EVERY DAY 90 tablet 1  . tamsulosin (FLOMAX) 0.4 MG CAPS capsule TAKE 1 CAPSULE BY MOUTH DAILY AFTER SUPPER. 90 capsule 1  . triamcinolone cream (KENALOG) 0.1 % APPLY TO AFFECTED AREA TWICE A DAY 454 g 3   No current  facility-administered medications on file prior to visit.   Allergies  Allergen Reactions  . Celebrex [Celecoxib] Rash and Other (See Comments)    Blister   Social History   Socioeconomic History  . Marital status: Widowed    Spouse name: Not on file  . Number of children: Not on file  . Years of education: Not on file  . Highest education level: Not on file  Occupational History  . Not on file  Tobacco Use  . Smoking status: Current Every Day Smoker    Packs/day: 1.00    Years: 60.00    Pack years: 60.00    Types: Cigarettes  . Smokeless tobacco: Never Used  Substance and Sexual Activity  . Alcohol use: No  . Drug use: No  . Sexual activity: Not on file  Other Topics Concern  . 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    Social Determinants of Health   Financial Resource Strain:   . Difficulty of Paying Living Expenses: Not on file  Food Insecurity:   . Worried About Charity fundraiser in the Last Year: Not on file  . Ran Out of Food in the Last Year: Not on file  Transportation Needs:   . Lack of Transportation (Medical): Not on file  . Lack of Transportation (Non-Medical): Not on file  Physical Activity:   . Days of Exercise per Week: Not on file  . Minutes of Exercise per Session: Not on file  Stress:   . Feeling of Stress : Not on file  Social Connections:   . Frequency of Communication with Friends and Family: Not on file  . Frequency of Social Gatherings with Friends and Family: Not on file  . Attends Religious Services: Not on file  . Active Member of Clubs or Organizations: Not on file  . Attends Archivist Meetings: Not on file  . Marital Status: Not on file  Intimate Partner Violence:   . Fear of Current or Ex-Partner: Not on file  . Emotionally Abused: Not on file  . Physically Abused: Not on file  .  Sexually Abused: Not on file      Review of Systems  All other systems reviewed and are negative.      Objective:   Physical Exam Vitals reviewed.  Constitutional:      General: He is not in acute distress.    Appearance: He is well-developed. He is not diaphoretic.  Cardiovascular:     Rate and Rhythm: Normal rate and regular rhythm.     Heart sounds: Normal heart sounds. No murmur heard.  No friction rub. No gallop.   Pulmonary:     Effort: Pulmonary effort is normal. No tachypnea, accessory muscle usage or respiratory distress.     Breath sounds: Examination of the right-upper field reveals decreased breath sounds. Examination of the left-upper field reveals decreased breath sounds. Examination of the right-middle field reveals decreased breath sounds. Examination of the left-middle field reveals decreased breath sounds. Examination of the right-lower field reveals decreased breath sounds. Examination of the left-lower field reveals decreased breath sounds. Decreased breath sounds and wheezing present. No rhonchi or rales.  Chest:     Chest wall: No tenderness.  Abdominal:     General: Bowel sounds are normal. There is no distension.     Palpations: Abdomen is soft. There is no mass.     Tenderness: There is no abdominal tenderness. There is no guarding or rebound.  Musculoskeletal:     Right lower leg: No edema.     Left lower leg: No edema.  Skin:    Findings: No erythema or rash.    Bilateral carotid bruit       Assessment & Plan:  Type 2 diabetes mellitus with diabetic neuropathy, with long-term current use of insulin ([place]) - Plan: CBC with Differential/Platelet, COMPLETE METABOLIC PANEL WITH GFR, Lipid panel, Hemoglobin A1c  COPD GOLD 0 still smoking   Dyspnea on exertion  Lung nodule  Bilateral carotid artery stenosis  General medical exam  Hyperlipidemia, unspecified hyperlipidemia type  Patient has numerous medical problems.  My biggest concern are  the abnormality seen on his PET scan.  He is following up with pulmonology in 1 month for repeat CT scan to monitor for stability or growth.  Certainly  cancer is on the differential diagnosis versus scar tissue versus atypical infection.  Unfortunately the patient continues to smoke and has no desire to quit at the present time.  I am concerned that he is developing nocturnal hypoxia which could be contributing to his dyspnea and fatigue.  Therefore I will schedule the patient for an overnight pulse oximetry.  I continue to encourage smoking cessation.  Immunizations are up-to-date.  Given his age I recommended against any prostate cancer screening or colon cancer screening.  Blood pressure today is acceptable.  I will check a CBC, CMP, fasting lipid panel, and an A1c.  I would like to see his LDL cholesterol below 70 and his A1c below 7.  Patient reports that his fasting blood sugars are less than 130 and his 2-hour postprandial sugars are less than 160

## 2020-05-09 LAB — CBC WITH DIFFERENTIAL/PLATELET
Absolute Monocytes: 673 cells/uL (ref 200–950)
Basophils Absolute: 74 cells/uL (ref 0–200)
Basophils Relative: 1 %
Eosinophils Absolute: 474 cells/uL (ref 15–500)
Eosinophils Relative: 6.4 %
HCT: 41.4 % (ref 38.5–50.0)
Hemoglobin: 13.3 g/dL (ref 13.2–17.1)
Lymphs Abs: 2457 cells/uL (ref 850–3900)
MCH: 28.9 pg (ref 27.0–33.0)
MCHC: 32.1 g/dL (ref 32.0–36.0)
MCV: 90 fL (ref 80.0–100.0)
MPV: 9.7 fL (ref 7.5–12.5)
Monocytes Relative: 9.1 %
Neutro Abs: 3722 cells/uL (ref 1500–7800)
Neutrophils Relative %: 50.3 %
Platelets: 277 10*3/uL (ref 140–400)
RBC: 4.6 10*6/uL (ref 4.20–5.80)
RDW: 14.1 % (ref 11.0–15.0)
Total Lymphocyte: 33.2 %
WBC: 7.4 10*3/uL (ref 3.8–10.8)

## 2020-05-09 LAB — COMPLETE METABOLIC PANEL WITH GFR
AG Ratio: 1.1 (calc) (ref 1.0–2.5)
ALT: 5 U/L — ABNORMAL LOW (ref 9–46)
AST: 13 U/L (ref 10–35)
Albumin: 4 g/dL (ref 3.6–5.1)
Alkaline phosphatase (APISO): 51 U/L (ref 35–144)
BUN/Creatinine Ratio: 15 (calc) (ref 6–22)
BUN: 21 mg/dL (ref 7–25)
CO2: 26 mmol/L (ref 20–32)
Calcium: 9.3 mg/dL (ref 8.6–10.3)
Chloride: 106 mmol/L (ref 98–110)
Creat: 1.37 mg/dL — ABNORMAL HIGH (ref 0.70–1.11)
GFR, Est African American: 55 mL/min/{1.73_m2} — ABNORMAL LOW (ref >=60)
GFR, Est Non African American: 47 mL/min/{1.73_m2} — ABNORMAL LOW (ref >=60)
Globulin: 3.6 g/dL (calc) (ref 1.9–3.7)
Glucose, Bld: 102 mg/dL — ABNORMAL HIGH (ref 65–99)
Potassium: 5.1 mmol/L (ref 3.5–5.3)
Sodium: 141 mmol/L (ref 135–146)
Total Bilirubin: 0.6 mg/dL (ref 0.2–1.2)
Total Protein: 7.6 g/dL (ref 6.1–8.1)

## 2020-05-09 LAB — HEMOGLOBIN A1C
Hgb A1c MFr Bld: 7.1 % of total Hgb — ABNORMAL HIGH (ref <5.7)
Mean Plasma Glucose: 157 (calc)
eAG (mmol/L): 8.7 (calc)

## 2020-05-09 LAB — LIPID PANEL
Cholesterol: 140 mg/dL (ref <200)
HDL: 41 mg/dL (ref >=40)
LDL Cholesterol (Calc): 80 mg/dL (calc)
Non-HDL Cholesterol (Calc): 99 mg/dL (calc) (ref <130)
Total CHOL/HDL Ratio: 3.4 (calc) (ref <5.0)
Triglycerides: 105 mg/dL (ref <150)

## 2020-05-12 ENCOUNTER — Ambulatory Visit
Admission: RE | Admit: 2020-05-12 | Discharge: 2020-05-12 | Disposition: A | Discharge status: Home / Self Care | Payer: Medicare HMO | Source: Ambulatory Visit | Attending: Internal Medicine | Admitting: Internal Medicine

## 2020-05-12 DIAGNOSIS — R918 Other nonspecific abnormal finding of lung field: Secondary | ICD-10-CM

## 2020-05-12 DIAGNOSIS — R911 Solitary pulmonary nodule: Secondary | ICD-10-CM

## 2020-05-12 DIAGNOSIS — R059 Cough, unspecified: Secondary | ICD-10-CM | POA: Diagnosis not present

## 2020-05-12 DIAGNOSIS — J984 Other disorders of lung: Secondary | ICD-10-CM | POA: Diagnosis not present

## 2020-05-12 DIAGNOSIS — J432 Centrilobular emphysema: Secondary | ICD-10-CM | POA: Diagnosis not present

## 2020-05-12 DIAGNOSIS — I2510 Atherosclerotic heart disease of native coronary artery without angina pectoris: Secondary | ICD-10-CM | POA: Diagnosis not present

## 2020-05-14 ENCOUNTER — Other Ambulatory Visit: Payer: Self-pay | Admitting: Family Medicine

## 2020-05-14 DIAGNOSIS — R69 Illness, unspecified: Secondary | ICD-10-CM | POA: Diagnosis not present

## 2020-05-15 ENCOUNTER — Other Ambulatory Visit: Payer: Self-pay | Admitting: Family Medicine

## 2020-05-15 DIAGNOSIS — R69 Illness, unspecified: Secondary | ICD-10-CM | POA: Diagnosis not present

## 2020-05-16 NOTE — Progress Notes (Signed)
I spoke with pt's granddaughter, [name] ok per DPR and notified of results/recs per Dr Melvyn Novas. She will discuss with pt and call if any questions. I tried to schedule pt for next available with [facility] 12/13 but he is not able to come in until 06/19/20.

## 2020-06-12 DIAGNOSIS — R69 Illness, unspecified: Secondary | ICD-10-CM | POA: Diagnosis not present

## 2020-06-15 ENCOUNTER — Other Ambulatory Visit: Payer: Self-pay | Admitting: Family Medicine

## 2020-06-15 ENCOUNTER — Telehealth: Payer: Self-pay | Admitting: Family Medicine

## 2020-06-15 MED ORDER — HYDROCODONE-ACETAMINOPHEN 5-325 MG PO TABS: 1.0000 | ORAL_TABLET | Freq: Four times a day (QID) | ORAL | 0 refills | Status: DC | PRN

## 2020-06-15 NOTE — Telephone Encounter (Signed)
Send to PCP.

## 2020-06-15 NOTE — Telephone Encounter (Signed)
refill Hydrocodne

## 2020-06-19 ENCOUNTER — Ambulatory Visit: Payer: Medicare HMO | Admitting: Pulmonary Disease

## 2020-06-19 ENCOUNTER — Encounter: Payer: Self-pay | Admitting: Pulmonary Disease

## 2020-06-19 ENCOUNTER — Other Ambulatory Visit: Payer: Self-pay

## 2020-06-19 VITALS — BP 120/68 | HR 67 | Ht 74.0 in | Wt 148.0 lb

## 2020-06-19 DIAGNOSIS — R911 Solitary pulmonary nodule: Secondary | ICD-10-CM | POA: Diagnosis not present

## 2020-06-19 DIAGNOSIS — F17200 Nicotine dependence, unspecified, uncomplicated: Secondary | ICD-10-CM | POA: Diagnosis not present

## 2020-06-19 DIAGNOSIS — J984 Other disorders of lung: Secondary | ICD-10-CM

## 2020-06-19 DIAGNOSIS — J432 Centrilobular emphysema: Secondary | ICD-10-CM | POA: Diagnosis not present

## 2020-06-19 DIAGNOSIS — R69 Illness, unspecified: Secondary | ICD-10-CM | POA: Diagnosis not present

## 2020-06-19 NOTE — Patient Instructions (Addendum)
Thank you for visiting Dr. Valeta Harms at [facility]. Today we recommend the following:  Orders Placed This Encounter  Procedures  . Ambulatory referral to Pulmonology   Planned bronchoscopy for Jan. 7th.  Nothing to eat the night before after midnight.   Return in about 2 months (around 08/20/2020) for W/ Dr. Melvyn Novas or Dr. Valeta Harms .    Please do your part to reduce the spread of COVID-19.

## 2020-06-19 NOTE — Progress Notes (Signed)
Synopsis: Referred in December 2021 for lung mass, Dr. Melvyn Novas, PCP: By Susy Frizzle, MD  Subjective:   PATIENT ID: [name] GENDER: male DOB: [birth year], MRN: [id number]  Chief Complaint  Patient presents with  . Follow-up    Pt states that he is about the same since last visit. Pt seen by Dr. Melvyn Novas and was told to see Dr. Valeta Harms due to lung mass.    This is an 85 year old gentleman past medical history of DVT, atrial flutter, CKD, reflux, hypertension, COPD.  Last seen by Dr. Melvyn Novas in office August 2021, active smoker.  Prior office spirometry completed.  CT scan of the chest was completed in November 2021.  This revealed a 17 mm thick walled cavitary lesion of the anterior right upper lobe concerning for a primary bronchogenic carcinoma.  Patient was referred by one of my partners for evaluation of consideration for navigational bronchoscopy and tissue sampling.  Today, patient presents to the office to discuss CT scan findings and next steps.  Unfortunately he still smoking a pack a day.  He has had serial CT imaging as well as a PET scan that showed moderate uptake in August.  Initial CT imaging in July 2021 which showed the cavitary lesion concerning either for an infectious etiology versus neoplasm.  This has been followed for the past 6 months which shows persistent size as well as thickening of the wall suspicious for a primary bronchogenic carcinoma.  Patient denies hemoptysis or weight loss at this time.  He is anxious about having another bronchoscopy.  He had one several years ago for a pneumonia evaluation.  This was done under conscious sedation.  I explained the difference today of the type of procedure we would be pursuing would include general anesthesia.  The patient's granddaughter was in the room as well as son on the telephone we had a conversation explaining all of this today and the various alternatives to include percutaneous sampling versus watchful waiting.   Past  Medical History:  Diagnosis Date  . Acute venous embolism and thrombosis of unspecified deep vessels of lower extremity   . Atrial flutter (HCC)    and SVT that is post ablation  . CAD (coronary artery disease)   . Chronic kidney disease    stones  . Coronary atherosclerosis 11/19/2008   Qualifier: Diagnosis of  By: Stanford Breed, MD, Kandyce Rud   . Dyslipidemia   . GERD (gastroesophageal reflux disease)   . HTN (hypertension)   . Shortness of breath    on exertion     No family history on file.   Past Surgical History:  Procedure Laterality Date  . CARDIAC CATHETERIZATION     s/p cardiac catheterization and bypass surgery as well as EGD with dilation x3, left lower extremity vein stripping  . CORONARY ARTERY BYPASS GRAFT    . ESOPHAGOGASTRODUODENOSCOPY N/A 12/06/2013   Procedure: ESOPHAGOGASTRODUODENOSCOPY (EGD);  Surgeon: Lear Ng, MD;  Location: [facility];  Service: Endoscopy;  Laterality: N/A;  . left knee anthroscopy    . SECONDARY CLOSURE ARM    . VIDEO BRONCHOSCOPY Bilateral 01/14/2018   Procedure: VIDEO BRONCHOSCOPY WITHOUT FLUORO;  Surgeon: Tanda Rockers, MD;  Location: [facility];  Service: Cardiopulmonary;  Laterality: Bilateral;    Social History   Socioeconomic History  . Marital status: Widowed    Spouse name: Not on file  . Number of children: Not on file  . Years of education: Not on file  . Highest  education level: Not on file  Occupational History  . Not on file  Tobacco Use  . Smoking status: Current Every Day Smoker    Packs/day: 1.00    Years: 60.00    Pack years: 60.00    Types: Cigarettes  . Smokeless tobacco: Never Used  Substance and Sexual Activity  . Alcohol use: No  . Drug use: No  . Sexual activity: Not on file  Other Topics Concern  . 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     Social Determinants of Health   Financial Resource Strain: Not on file  Food Insecurity: Not on file  Transportation Needs: Not on file  Physical Activity: Not on file  Stress: Not on file  Social Connections: Not on file  Intimate Partner Violence: Not on file     Allergies  Allergen Reactions  . Celebrex [Celecoxib] Rash and Other (See Comments)    Blister     Outpatient Medications Prior to Visit  Medication Sig Dispense Refill  . aspirin 81 MG tablet Take 1 tablet (81 mg total) by mouth daily.    . clobetasol cream (TEMOVATE) 0.05 % Please specify directions, refills and quantity 1 g 0  . clotrimazole (LOTRIMIN) 1 % cream Apply 1 application topically 2 (two) times daily. Apply to the head of the penis 30 g 3  . glucose blood (ONETOUCH ULTRA) test strip USE TO CHECK BLOOD SUGAR 4  TIMES DAILY. E11.9 100 strip 9  . HYDROcodone-acetaminophen (NORCO) 5-325 MG tablet Take 1 tablet by mouth every 6 (six) hours as needed for moderate pain. 30 tablet 0  . hydrOXYzine (ATARAX/VISTARIL) 25 MG tablet TAKE 1 TABLET BY MOUTH 3 TIMES DAILY AS NEEDED FOR ITCHING. 30 tablet 3  . Insulin Syringe-Needle U-100 (BD INSULIN SYRINGE U/F) 31G X 5/16" 0.3 ML MISC USE WITH NOVOLIN TWICE DAY WITH A MEAL 100 each 5  . nitroGLYCERIN (NITROSTAT) 0.4 MG SL tablet Place 0.4 mg under the tongue every 5 (five) minutes as needed for chest pain.    Marland Kitchen NOVOLIN 70/30 (70-30) 100 UNIT/ML injection INJECT 8 UNITS SQ IN THE MORNING AND INJECT 12 UNITS SQ AT BEDTIME 10 mL 11  . OneTouch Delica Lancets 31D MISC USE TO CHECK BLOOD SUAGR 3 TIMES A DAY DX:E11.9 100 each 5  . pantoprazole (PROTONIX) 40 MG tablet TAKE 1 TABLET BY MOUTH EVERY DAY 90 tablet 3  . pravastatin (PRAVACHOL) 40 MG tablet TAKE 1 TABLET BY MOUTH EVERY DAY 90 tablet 1  . tamsulosin (FLOMAX) 0.4 MG CAPS capsule TAKE 1 CAPSULE BY MOUTH DAILY AFTER SUPPER. 90 capsule 1  . triamcinolone cream (KENALOG) 0.1 % APPLY TO AFFECTED AREA TWICE A DAY 454 g 3  .  ciprofloxacin (CIPRO) 500 MG tablet Take 1 tablet (500 mg total) by mouth 2 (two) times daily. 20 tablet 0   No facility-administered medications prior to visit.    Review of Systems  Constitutional: Negative for chills, fever, malaise/fatigue and weight loss.  HENT: Negative for hearing loss, sore throat and tinnitus.   Eyes: Negative for blurred vision and double vision.  Respiratory: Positive for cough and shortness of breath. Negative for hemoptysis, sputum production, wheezing and stridor.   Cardiovascular: Negative for chest pain, palpitations, orthopnea, leg swelling and PND.  Gastrointestinal: Negative for abdominal pain, constipation, diarrhea, heartburn, nausea and vomiting.  Genitourinary: Negative for dysuria, hematuria and urgency.  Musculoskeletal: Negative for joint pain and myalgias.  Skin: Negative for itching and rash.  Neurological: Negative for dizziness, tingling, weakness and headaches.  Endo/Heme/Allergies: Negative for environmental allergies. Does not bruise/bleed easily.  Psychiatric/Behavioral: Negative for depression. The patient is not nervous/anxious and does not have insomnia.   All other systems reviewed and are negative.    Objective:  Physical Exam Vitals reviewed.  Constitutional:      General: He is not in acute distress.    Appearance: He is well-developed.  HENT:     Head: Normocephalic and atraumatic.     Mouth/Throat:     Mouth: Oropharynx is clear and moist.     Pharynx: No oropharyngeal exudate.  Eyes:     Extraocular Movements: EOM normal.     Conjunctiva/sclera: Conjunctivae normal.     Pupils: Pupils are equal, round, and reactive to light.  Neck:     Vascular: No JVD.     Trachea: No tracheal deviation.     Comments: Loss of supraclavicular fat Cardiovascular:     Rate and Rhythm: Normal rate and regular rhythm.     Pulses: Intact distal pulses.     Heart sounds: S1 normal and S2 normal.     Comments: Distant heart  tones Pulmonary:     Effort: No tachypnea or accessory muscle usage.     Breath sounds: No stridor. Decreased breath sounds (throughout all lung fields) present. No wheezing, rhonchi or rales.     Comments: Increased AP chest diameter Abdominal:     General: Bowel sounds are normal. There is no distension.     Palpations: Abdomen is soft.     Tenderness: There is no abdominal tenderness.  Musculoskeletal:        General: Deformity (muscle wasting ) present. No edema.  Skin:    General: Skin is warm and dry.     Capillary Refill: Capillary refill takes less than 2 seconds.     Findings: No rash.  Neurological:     Mental Status: He is alert and oriented to person, place, and time.  Psychiatric:        Mood and Affect: Mood and affect normal.        Behavior: Behavior normal.      Vitals:   06/19/20 1021  BP: 120/68  Pulse: 67  SpO2: 97%  Weight: 148 lb (67.1 kg)  Height: 6\' 2"  (1.88 m)   97% on RA BMI Readings from Last 3 Encounters:  06/19/20 19.00 kg/m  05/08/20 18.75 kg/m  01/25/20 19.52 kg/m   Wt Readings from Last 3 Encounters:  06/19/20 148 lb (67.1 kg)  05/08/20 146 lb (66.2 kg)  01/25/20 152 lb (68.9 kg)     CBC    Component Value Date/Time   WBC 7.4 05/08/2020 0851   RBC 4.60 05/08/2020 0851   HGB 13.3 05/08/2020 0851   HCT 41.4 05/08/2020 0851   PLT 277 05/08/2020 0851   MCV 90.0 05/08/2020 0851   MCH 28.9 05/08/2020 0851   MCHC 32.1 05/08/2020 0851   RDW 14.1 05/08/2020 0851   LYMPHSABS 2,457 05/08/2020 0851   MONOABS 1.1 (H) 07/31/2017 0325   EOSABS 474 05/08/2020 0851   BASOSABS 74 05/08/2020 0851    Chest Imaging: CT chest 05/15/2020: 17 mm thick walled cavitary lesion of the right anterior upper lobe concerning for primary bronchogenic carcinoma. The patient's images have been independently reviewed by me.     Pulmonary  Functions Testing Results: No flowsheet data found.  FeNO:   Pathology:   Echocardiogram:   Heart  Catheterization:     Assessment & Plan:     ICD-10-CM   1. Right upper lobe pulmonary nodule  R91.1 Ambulatory referral to Pulmonology  2. Pulmonary cavitary lesion  J98.4   3. Smoker  F17.200   4. Centrilobular emphysema (HCC)  J43.2    Discussion:  85 year old gentleman with a right upper lobe cavitary pulmonary nodule, active smoker with associated centrilobular emphysema.  Previous CT imaging in July 2021.  Today in the office we reviewed imaging from July, the nuclear medicine pet imaging that was completed in August as well as the recent follow-up imaging in November 2021.  Plan: We discussed the risk benefits and alternatives of proceeding with navigational bronchoscopy. We also discussed alternatives to include percutaneous needle biopsy. We discussed risk of pneumothorax, bleeding associated with both procedures. I believe the next appropriate step would be consideration for navigational bronchoscopy with tissue sampling if we prove this was a malignancy consider placement of fiducial markers and referral to radiation oncology for SBRT. Patient was also encouraged to quit smoking.  We will tentatively plan for bronchoscopy on January 7. Orders placed.   Current Outpatient Medications:  .  aspirin 81 MG tablet, Take 1 tablet (81 mg total) by mouth daily., Disp: , Rfl:  .  clobetasol cream (TEMOVATE) 0.05 %, Please specify directions, refills and quantity, Disp: 1 g, Rfl: 0 .  clotrimazole (LOTRIMIN) 1 % cream, Apply 1 application topically 2 (two) times daily. Apply to the head of the penis, Disp: 30 g, Rfl: 3 .  glucose blood (ONETOUCH ULTRA) test strip, USE TO CHECK BLOOD SUGAR 4  TIMES DAILY. E11.9, Disp: 100 strip, Rfl: 9 .  HYDROcodone-acetaminophen (NORCO) 5-325 MG tablet, Take 1 tablet by mouth every 6 (six) hours as needed for moderate pain., Disp: 30 tablet, Rfl: 0 .  hydrOXYzine (ATARAX/VISTARIL) 25 MG tablet, TAKE 1 TABLET BY MOUTH 3 TIMES DAILY AS NEEDED FOR  ITCHING., Disp: 30 tablet, Rfl: 3 .  Insulin Syringe-Needle U-100 (BD INSULIN SYRINGE U/F) 31G X 5/16" 0.3 ML MISC, USE WITH NOVOLIN TWICE DAY WITH A MEAL, Disp: 100 each, Rfl: 5 .  nitroGLYCERIN (NITROSTAT) 0.4 MG SL tablet, Place 0.4 mg under the tongue every 5 (five) minutes as needed for chest pain., Disp: , Rfl:  .  NOVOLIN 70/30 (70-30) 100 UNIT/ML injection, INJECT 8 UNITS SQ IN THE MORNING AND INJECT 12 UNITS SQ AT BEDTIME, Disp: 10 mL, Rfl: 11 .  OneTouch Delica Lancets 92Z MISC, USE TO CHECK BLOOD SUAGR 3 TIMES A DAY DX:E11.9, Disp: 100 each, Rfl: 5 .  pantoprazole (PROTONIX) 40 MG tablet, TAKE 1 TABLET BY MOUTH EVERY DAY, Disp: 90 tablet, Rfl: 3 .  pravastatin (PRAVACHOL) 40 MG tablet, TAKE 1 TABLET BY MOUTH EVERY DAY, Disp: 90 tablet, Rfl: 1 .  tamsulosin (FLOMAX) 0.4 MG CAPS capsule, TAKE 1 CAPSULE BY MOUTH DAILY AFTER SUPPER., Disp: 90 capsule, Rfl: 1 .  triamcinolone cream (KENALOG) 0.1 %, APPLY TO AFFECTED AREA TWICE A DAY, Disp: 454 g, Rfl: 3  I spent 61 minutes dedicated to the care of this patient on the date of this encounter to include pre-visit review of records, face-to-face time with the patient discussing conditions above, post visit ordering of testing, clinical documentation with the electronic health record, making appropriate referrals as documented, and communicating necessary findings to members of the patients care team.   Leory Plowman  Marvel Plan, DO [facility] 06/19/2020 10:45 AM

## 2020-06-26 ENCOUNTER — Telehealth: Payer: Self-pay | Admitting: Pulmonary Disease

## 2020-06-26 NOTE — Telephone Encounter (Signed)
PCCM:  I called and spoke with patients grand-daugther.   They would like to CANCEL the procedure that is scheduled for this Friday.   I will forward to Dr. Melvyn Novas who will be seeing this patient in follow up.   If the patient decides to move forward with bronchoscopy will be happy to see and re-schedule in the future.   Garner Nash, DO [facility] 06/26/2020 11:38 AM

## 2020-06-28 ENCOUNTER — Other Ambulatory Visit (HOSPITAL_COMMUNITY): Payer: Medicare HMO

## 2020-06-30 ENCOUNTER — Encounter (HOSPITAL_COMMUNITY): Admission: RE | Payer: Self-pay | Source: Home / Self Care

## 2020-06-30 ENCOUNTER — Ambulatory Visit (HOSPITAL_COMMUNITY): Admission: RE | Admit: 2020-06-30 | Payer: Medicare HMO | Source: Home / Self Care | Admitting: Pulmonary Disease

## 2020-06-30 SURGERY — VIDEO BRONCHOSCOPY WITH ENDOBRONCHIAL NAVIGATION
Anesthesia: General

## 2020-07-03 ENCOUNTER — Telehealth: Payer: Self-pay

## 2020-07-03 ENCOUNTER — Other Ambulatory Visit: Payer: Self-pay

## 2020-07-03 ENCOUNTER — Other Ambulatory Visit: Payer: Self-pay | Admitting: Family Medicine

## 2020-07-03 ENCOUNTER — Telehealth: Payer: Self-pay | Admitting: Family Medicine

## 2020-07-03 MED ORDER — TRIAMCINOLONE ACETONIDE 0.1 % EX CREA: TOPICAL_CREAM | CUTANEOUS | 3 refills | Status: DC

## 2020-07-03 MED ORDER — PREDNISONE 20 MG PO TABS
ORAL_TABLET | ORAL | 0 refills | Status: DC
Start: 2020-07-03 — End: 2021-01-29

## 2020-07-03 MED ORDER — CLOTRIMAZOLE 1 % EX CREA: 1.0000 "application " | TOPICAL_CREAM | Freq: Two times a day (BID) | CUTANEOUS | 3 refills | Status: DC

## 2020-07-03 NOTE — Telephone Encounter (Signed)
Patient granddaughter was called back, rx cream was sent in and pulse oxy order placed and sent

## 2020-07-03 NOTE — Telephone Encounter (Signed)
Patient granddaughter called for pulse oxy order that was placed.

## 2020-07-03 NOTE — Telephone Encounter (Signed)
Pt itching all over  need medication for itching

## 2020-07-04 ENCOUNTER — Telehealth: Payer: Self-pay

## 2020-07-04 NOTE — Telephone Encounter (Signed)
Fax prescription order over to [facility]

## 2020-07-25 ENCOUNTER — Other Ambulatory Visit: Payer: Self-pay | Admitting: Family Medicine

## 2020-08-18 ENCOUNTER — Other Ambulatory Visit: Payer: Self-pay

## 2020-08-18 ENCOUNTER — Telehealth: Payer: Self-pay

## 2020-08-18 ENCOUNTER — Telehealth: Payer: Self-pay | Admitting: Family Medicine

## 2020-08-18 MED ORDER — BLOOD GLUCOSE METER KIT: PACK | 0 refills | Status: AC
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
## 2020-08-18 NOTE — Telephone Encounter (Signed)
Pt came in office stating that he lost his entire glucose monitoring kit, and would like a new one as he can not check his sugar. Please send to   [id number] [place]  Cb# [phone]

## 2020-08-21 ENCOUNTER — Encounter: Payer: Self-pay | Admitting: Internal Medicine

## 2020-08-21 ENCOUNTER — Ambulatory Visit: Payer: Medicare HMO | Admitting: Internal Medicine

## 2020-08-21 ENCOUNTER — Ambulatory Visit (INDEPENDENT_AMBULATORY_CARE_PROVIDER_SITE_OTHER): Payer: Medicare HMO

## 2020-08-21 ENCOUNTER — Other Ambulatory Visit: Payer: Self-pay

## 2020-08-21 DIAGNOSIS — R918 Other nonspecific abnormal finding of lung field: Secondary | ICD-10-CM

## 2020-08-21 DIAGNOSIS — J449 Chronic obstructive pulmonary disease, unspecified: Secondary | ICD-10-CM | POA: Diagnosis not present

## 2020-08-21 DIAGNOSIS — F17210 Nicotine dependence, cigarettes, uncomplicated: Secondary | ICD-10-CM | POA: Diagnosis not present

## 2020-08-21 DIAGNOSIS — R69 Illness, unspecified: Secondary | ICD-10-CM | POA: Diagnosis not present

## 2020-08-21 DIAGNOSIS — R059 Cough, unspecified: Secondary | ICD-10-CM | POA: Diagnosis not present

## 2020-08-21 NOTE — Progress Notes (Addendum)
[name], male    DOB: 26-Oct-1934     MRN: [id number]    Brief patient profile:  52 yowm active smoker with new onset sob/cough x 2014 worse since April 2019  Assoc with chronic dysphagia with documented mild silent aspiration by [facility] 08/29/17 and not able to swallow barium tablet.  Referred 01/08/2018 by Dr Dennard Schaumann for eval infiltrates on cxr RLL ant basal segment first noted 12/11/17 with suggestion of mucus plugging by CT chest 12/24/17    History of Present Illness  01/08/2018 1st pulmonary office eval/ [name] Chief Complaint  Patient presents with  . Pulmonary Consult    Referred by Dr. Dennard Schaumann. Pt states he has had recurrent PNA since April 2019. He states he always feels SOB with or without exertion. He is using albuterol nebs 2 x daily.    Dyspnea: uphill when walks back from mailbox= MMRC1 = can walk nl pace, flat grade, can't hurry or go uphills or steps s sob    Cough: rattling / worse since April  2019 some better with abx transiently each time  / min mucoid sputum esp in am  Sleep: sleeps prone SABA use: not sure they help  Chronic dysphagia, lost about 10 lb since onset of coughing April 2019  Already eval by ST and Gdaughter says he's been following instructions but not helping swallowing rec The key is to stop smoking completely before smoking completely stops you!  Best cough medication mucinex 1200 mg every 12 hours as needed         03/05/2018  f/u ov/[name] re: cough /dysphagia  Chief Complaint  Patient presents with  . Follow-up    Breathing has improved. He has occ cough with white sputum. He has not needed his neb or combivent inhaler.  Dyspnea:  MMRC1 = can walk nl pace, flat grade, can't hurry or go uphills or steps s sob  Improved mb to house  Cough: better / some white in am/ still occ choking on food  Sleeping:  Lies flat/ one pillow SABA use: none used - has combivent not using  02: no  rec Increase pantoprazole to 40 mg Take 30- 60 min before your  first and last meals of the day  GERDrx  If throat not some better after 4 weeks on this plan then call me for referral to ENT    06/03/2018  f/u ov/[name] re: chronic cough/ sob /  Chief Complaint  Patient presents with  . Follow-up    Pt states "I stay SOB all the time". He has not used his combivent or neb recently.    Dyspnea:  Able to walk mb and back s stopping, slow pace at grocery store and yet feels continually sob at rest sitting (not apparent  at ov)  Cough: cough  Is rattling/ congested worse in am and sometimes p meals  Sleeping: lie flat in bed ok  SABA use: none 02: none   rec F/u prn    01/25/2020  f/u ov/[name] re:  GOLD 0 copd/ still smoking / consult Dr Dennard Schaumann re RUL nodule  Chief Complaint  Patient presents with  . Consult    Referred  by Dr. Dennard Schaumann-  concerned about recent CT Chest done 12/31/19- nodule. He states breathing has been worse since the last visit. He has occ cough- sometimes prod with white to yellow sputum.   Dyspnea:  MMRC3 = can't walk 100 yards even at a slow pace at a flat grade s  stopping due to sob   Cough: some more than usual x sev months / never bloody but sometime yellow  Sleeping: ok flat in bed  SABA use: none  02: none  rec cipro 500 mg twice daily x 10 days     CT chest 05/12/20  17 mm thick-walled cavitary lesion in the anterior right upper lobe, unchanged, suspicious for primary bronchogenic neoplasm such as squamous cell carcinoma. No findings specific for metastatic disease.> rec fob    06/26/20  Called Dr Valeta Harms to cancel FOB    08/21/2020  f/u ov/[name] re: copd/ lung nodule Chief Complaint  Patient presents with  . Follow-up    SOB and coughing   Dyspnea: still able to shop/ drive but very slow pace  Cough: nothing bloody  Sleeping:  Flat/ one pillow  SABA use: none  02: none Covid status:   All 3 vax    No obvious day to day or daytime variability or assoc excess/ purulent sputum or mucus plugs or hemoptysis or cp  or chest tightness, subjective wheeze or overt sinus or hb symptoms.   Sleeping  without nocturnal  or early am exacerbation  of respiratory  c/o's or need for noct saba. Also denies any obvious fluctuation of symptoms with weather or environmental changes or other aggravating or alleviating factors except as outlined above   No unusual exposure hx or h/o childhood pna/ asthma or knowledge of premature birth.  Current Allergies, Complete Past Medical History, Past Surgical History, Family History, and Social History were reviewed in [employer] record.  ROS  The following are not active complaints unless bolded Hoarseness, sore throat, dysphagia, dental problems, itching, sneezing,  nasal congestion or discharge of excess mucus or purulent secretions, ear ache,   fever, chills, sweats, unintended wt loss or wt gain, classically pleuritic or exertional cp,  orthopnea pnd or arm/hand swelling  or leg swelling, presyncope, palpitations, abdominal pain, anorexia, nausea, vomiting, diarrhea  or change in bowel habits or change in bladder habits, change in stools or change in urine, dysuria, hematuria,  rash, arthralgias, visual complaints, headache, numbness, weakness or ataxia or problems with walking or coordination,  change in mood or  memory.        Current Meds  Medication Sig  . aspirin 81 MG tablet Take 1 tablet (81 mg total) by mouth daily.  . blood glucose meter kit and supplies Dispense based on patient and insurance preference. Use up to four times daily as directed. (FOR ICD-10 E10.9, E11.9).  . clobetasol cream (TEMOVATE) 0.05 % Please specify directions, refills and quantity  . clotrimazole (LOTRIMIN) 1 % cream Apply 1 application topically 2 (two) times daily. Apply to the head of the penis  . glucose blood (ONETOUCH ULTRA) test strip USE TO CHECK BLOOD SUGAR 4  TIMES DAILY. E11.9  . HYDROcodone-acetaminophen (NORCO) 5-325 MG tablet Take 1 tablet by mouth every 6  (six) hours as needed for moderate pain.  . hydrOXYzine (ATARAX/VISTARIL) 25 MG tablet TAKE 1 TABLET BY MOUTH 3 TIMES DAILY AS NEEDED FOR ITCHING.  . Insulin Syringe-Needle U-100 (BD INSULIN SYRINGE U/F) 31G X 5/16" 0.3 ML MISC USE WITH NOVOLIN TWICE DAY WITH A MEAL  . nitroGLYCERIN (NITROSTAT) 0.4 MG SL tablet Place 0.4 mg under the tongue every 5 (five) minutes as needed for chest pain.  Marland Kitchen NOVOLIN 70/30 (70-30) 100 UNIT/ML injection INJECT 8 UNITS SQ IN THE MORNING AND INJECT 12 UNITS SQ AT BEDTIME  . OneTouch Delica  Lancets 33G MISC USE TO CHECK BLOOD SUAGR 3 TIMES A DAY DX:E11.9  . pantoprazole (PROTONIX) 40 MG tablet TAKE 1 TABLET BY MOUTH EVERY DAY  . pravastatin (PRAVACHOL) 40 MG tablet TAKE 1 TABLET BY MOUTH EVERY DAY  . predniSONE (DELTASONE) 20 MG tablet 3 tabs poqday 1-2, 2 tabs poqday 3-4, 1 tab poqday 5-6  . tamsulosin (FLOMAX) 0.4 MG CAPS capsule TAKE 1 CAPSULE BY MOUTH DAILY AFTER SUPPER.  Marland Kitchen triamcinolone (KENALOG) 0.1 % APPLY TO AFFECTED AREA TWICE A DAY                   Objective:        08/21/2020        145  01/25/2020          152  06/03/2018      138   03/05/2018       143   01/08/18 138 lb (62.6 kg)  12/23/17 144 lb (65.3 kg)    Vital signs reviewed  08/21/2020  - Note at rest 02 sats  98% on RA   General appearance:    Elderly wm / slow talking / very hard of hearing/ rattling cough      HEENT : pt wearing mask not removed for exam due to covid - 19 concerns.    NECK :  without JVD/Nodes/TM/ nl carotid upstrokes bilaterally   LUNGS: no acc muscle use,  Mild barrel  contour chest wall with bilateral  Distant bs s audible wheeze and  without cough on insp or exp maneuvers  and mild  Hyperresonant  to  percussion bilaterally     CV:  RRR  no s3 or murmur or increase in P2, and no edema   ABD:  soft and nontender with pos end  insp Hoover's  in the supine position. No bruits or organomegaly appreciated, bowel sounds nl  MS:   Nl gait/  ext warm without  deformities, calf tenderness, cyanosis or clubbing No obvious joint restrictions   SKIN: warm and dry without lesions    NEURO:  alert, approp, nl sensorium with  no motor or cerebellar deficits apparent.        CXR PA and Lateral:   08/21/2020 :    I personally reviewed images and agree with radiology impression as follows:    1. Chronic bronchial thickening and hyperinflation most consistent with COPD. No acute pulmonary opacity. 2. Cavitary lesion at the right lung apex on prior CT and PET is not well-defined by radiograph. Apical pleuroparenchymal scarring is unchanged.     Lab Results  Component Value Date   ESRSEDRATE 18 08/21/2020   ESRSEDRATE 70 (H) 12/10/2017   ESRSEDRATE 33 (H) 10/14/2017     Labs ordered 08/21/2020  :   Quant gold tb crypto and histo antigens         Assessment

## 2020-08-21 NOTE — Patient Instructions (Signed)
The key is to stop smoking completely before smoking completely stops you!  Please remember to go to the lab and x-ray department  for your tests - we will call you with the results when they are available.       Please schedule a follow up visit in  6 months but call sooner if needed

## 2020-08-22 ENCOUNTER — Telehealth: Payer: Self-pay | Admitting: *Deleted

## 2020-08-22 ENCOUNTER — Ambulatory Visit (HOSPITAL_COMMUNITY)
Admission: RE | Admit: 2020-08-22 | Discharge: 2020-08-22 | Disposition: A | Discharge status: Home / Self Care | Payer: Medicare HMO | Source: Ambulatory Visit | Attending: Cardiology | Admitting: Cardiology

## 2020-08-22 ENCOUNTER — Encounter: Payer: Self-pay | Admitting: *Deleted

## 2020-08-22 ENCOUNTER — Other Ambulatory Visit: Payer: Self-pay | Admitting: Cardiology

## 2020-08-22 DIAGNOSIS — I679 Cerebrovascular disease, unspecified: Secondary | ICD-10-CM | POA: Insufficient documentation

## 2020-08-22 DIAGNOSIS — J449 Chronic obstructive pulmonary disease, unspecified: Secondary | ICD-10-CM

## 2020-08-22 LAB — SEDIMENTATION RATE: Sed Rate: 18 mm/hr (ref 0–20)

## 2020-08-22 NOTE — Telephone Encounter (Signed)
-----   Message from Satira Anis sent at 08/22/2020  9:59 AM EST ----- Regarding: Harlon Flor.    Dr. Melvyn Novas placed an ONO order for this patient yesterday & did not include the instructions on how to administer the ONO.  Will you please either update the order w/ the instructions or place a new order for this?  Thank you,  [facility]

## 2020-08-22 NOTE — Telephone Encounter (Signed)
New order placed for ONO on RA. Forwarding to Selmer to let her know that this was done. Thanks.

## 2020-08-22 NOTE — Telephone Encounter (Signed)
Yes room air is fine

## 2020-08-22 NOTE — Telephone Encounter (Signed)
Dr Melvyn Novas, I assume this ONO is meant to be on RA but can you please just confirm that and then I can place new order? thanks!

## 2020-08-23 ENCOUNTER — Encounter: Payer: Self-pay | Admitting: Internal Medicine

## 2020-08-23 LAB — CRYPTOCOCCAL ANTIGEN: Cryptococcus Antigen, Serum: NEGATIVE

## 2020-08-23 NOTE — Assessment & Plan Note (Signed)
Counseled re importance of smoking cessation but did not meet time criteria for separate billing            Each maintenance medication was reviewed in detail including emphasizing most importantly the difference between maintenance and prns and under what circumstances the prns are to be triggered using an action plan format where appropriate.  Total time for H and P, chart review, counseling,   and generating customized AVS unique to this office visit / same day charting = 53min

## 2020-08-23 NOTE — Assessment & Plan Note (Signed)
Onset spring 2019 assoc with documented aspiration on DgEs 08/2017 in pt with poor cough mechanics  - cxr changes 12/11/17 no better 01/08/2018 with "mucus plugs" by CT chest 12/23/17 in active smoker - REC fob 01/14/18 > copious retained mucus> tbbx c/w organizing pna/ neg cyt/ neg gm stain / few pseudomonas sensitive to cipro on culture so rec on 01/19/18 start  750 bid x 10 days then ov in 2 weeks with cxr> marked improvement on cxr 03/05/2018  - CT 12/31/19 new RUL/apical  partially cavitary nodule  - 01/25/2020 rx cipro 500 mg bid x 10 days then cxr at 2 weeks and if not change then PET to follow - PET  done 02/18/20: 1. Mild FDG uptake associated with cavitary lesion in the RIGHT upper lobe. Infection or neoplasm could have this appearance. Bronchogenic neoplasm remains in the differential with hypermetabolic RIGHT hilar nodal tissue. 2. RIGHT lower lobe process including small lymph nodes along the bronchovascular structures compatible with infectious or inflammatory changes, associated with tree-in-bud opacities in the RIGHT lower lobe. The extent to which nodal disease in the chest is related to potential neoplasm or inflammation is unclear. Would also correlate with any respiratory symptoms or risk factors for Aspiration   3. Vallecular activity with suggestion of subtle soft tissue thickening at the base of the epiglottis >>> rec repeat CT chest in 3 months vs refer for navigational bx vs ebus  - CT 05/12/20 17 mm thick-walled cavitary lesion in the anterior right upper lobe, unchanged, suspicious for primary bronchogenic neoplasm such as squamous cell carcinoma. > referred for navigational bx with ebus  Also  > declined  - Quant TB 08/21/2020  - Histo ag 08/21/2020  - Crypto Ang 08/21/2020    Discussed in detail all the  indications, usual  risks and alternatives  relative to the benefits with patient/daughter  >  Due to hearing deficit it's not really clear he understands the full picutre but  Dr Windell Norfolk and I have tried the best we can to communicate this > he declines intervention for now

## 2020-08-23 NOTE — Assessment & Plan Note (Signed)
Active smoker - Spirometry 06/03/2018  FEV1 2.6 (77%)  Ratio 72 with atypical curvature - 06/03/2018   Walked RA  2 laps @ 256ft each @ slow to avg pace  stopped due to end of study, minimal sob  -  01/25/2020   Walked RA  2 laps @ approx 251ft each @ moderate pace  stopped due to end of study,   no sob - sats still 96% at end   No limiting sob or aecopd >  No rx needed other than breathe clean air

## 2020-08-24 ENCOUNTER — Encounter: Payer: Self-pay | Admitting: *Deleted

## 2020-08-24 LAB — QUANTIFERON-TB GOLD PLUS
Mitogen-NIL: >10 IU/mL
NIL: 0.02 IU/mL
QuantiFERON-TB Gold Plus: NEGATIVE
TB1-NIL: 0.01 IU/mL
TB2-NIL: 0.01 IU/mL

## 2020-08-24 LAB — EXTRA SPECIMEN

## 2020-08-24 LAB — HISTOPLASMA ANTIGEN (BLD, CSF, BRONCH WASH, OTHER)
Interpretation:: NEGATIVE
RESULT:: NOT DETECTED ng/mL

## 2020-08-29 DIAGNOSIS — G4730 Sleep apnea, unspecified: Secondary | ICD-10-CM | POA: Diagnosis not present

## 2020-08-29 DIAGNOSIS — R0683 Snoring: Secondary | ICD-10-CM | POA: Diagnosis not present

## 2020-09-05 ENCOUNTER — Encounter: Payer: Self-pay | Admitting: Internal Medicine

## 2020-09-05 ENCOUNTER — Telehealth: Payer: Self-pay | Admitting: Internal Medicine

## 2020-09-05 DIAGNOSIS — G4734 Idiopathic sleep related nonobstructive alveolar hypoventilation: Secondary | ICD-10-CM

## 2020-09-05 NOTE — Telephone Encounter (Signed)
ONO on RA done by [facility] 08/29/20 reviewed by Dr Melvyn Novas- Per Dr Melvyn Novas pt qualifies for o2 2lpm, but this is not mandatory as the results are relatively mild. LMTCB for the pt.

## 2020-09-11 NOTE — Telephone Encounter (Signed)
Spoke with the pt's grand daughter, [name], [place] per DPR and notified of results/recs  She will discuss with the pt and then call us back  Will hold in my basket

## 2020-09-25 DIAGNOSIS — G4736 Sleep related hypoventilation in conditions classified elsewhere: Secondary | ICD-10-CM | POA: Diagnosis not present

## 2020-09-25 NOTE — Telephone Encounter (Signed)
Spoke with [name], ok per DPR  She states that the pt decided that he does want the nocturnal o2  I have sent order for this to DME

## 2020-10-25 ENCOUNTER — Other Ambulatory Visit: Payer: Self-pay | Admitting: Family Medicine

## 2020-10-25 DIAGNOSIS — G4736 Sleep related hypoventilation in conditions classified elsewhere: Secondary | ICD-10-CM | POA: Diagnosis not present

## 2020-11-03 ENCOUNTER — Other Ambulatory Visit: Payer: Self-pay | Admitting: Family Medicine

## 2020-11-03 MED ORDER — HYDROCODONE-ACETAMINOPHEN 5-325 MG PO TABS: 1.0000 | ORAL_TABLET | Freq: Four times a day (QID) | ORAL | 0 refills | Status: DC | PRN

## 2020-11-03 NOTE — Telephone Encounter (Signed)
Patient came to the office to request a refill of HYDROcodone-acetaminophen [facility]) 5-325 MG tablet [[id number]] Pharmacy confirmed as   [id number] - [place], [place]  [address] [place] [address]  Phone:  [phone] Fax:  [phone]  DEA #:  [id number]  Please contact patient when refill called in at [phone] or cell [phone].

## 2020-11-03 NOTE — Telephone Encounter (Signed)
Ok to refill??  Last office visit 05/08/2020.  Last refill 06/15/2020.

## 2020-11-24 NOTE — Progress Notes (Signed)
HPI: FU coronary artery disease, status post coronary bypassing graft. This was performed in 1998. His last catheterization in October 2008 showed a patent LIMA to the LAD and a patent saphenous vein graft to the right coronary artery. However, he did have significant left main disease that bifurcated into the circumflex. He had a drug-eluting stent to his left main at that time. Abdominal ultrasound in August of 2011 showed no aneurysm. He also has a history of SVT and atrial flutter ablation. Nuclear study June 2020 showed ejection fraction 60% with normal perfusion. Carotid Dopplers March 2022 showed 1 to 39% right and 60 to 79% left stenosis. Since I last saw him he has occasional chest pain which is a chronic issue for him.  It is in the left chest area and not related to exertion.  Can last up to 20 minutes at a time.  He does not have exertional chest pain.  He denies syncope.  He has some dyspnea on exertion.  Current Outpatient Medications  Medication Sig Dispense Refill   aspirin 81 MG tablet Take 1 tablet (81 mg total) by mouth daily.     blood glucose meter kit and supplies Dispense based on patient and insurance preference. Use up to four times daily as directed. (FOR ICD-10 E10.9, E11.9). 1 each 0   clobetasol cream (TEMOVATE) 0.05 % Please specify directions, refills and quantity 1 g 0   clotrimazole (LOTRIMIN) 1 % cream Apply 1 application topically 2 (two) times daily. Apply to the head of the penis 30 g 3   glucose blood (ONETOUCH ULTRA) test strip USE TO CHECK BLOOD SUGAR 4  TIMES DAILY. E11.9 100 strip 9   HYDROcodone-acetaminophen (NORCO) 5-325 MG tablet Take 1 tablet by mouth every 6 (six) hours as needed for moderate pain. 30 tablet 0   hydrOXYzine (ATARAX/VISTARIL) 25 MG tablet TAKE 1 TABLET BY MOUTH 3 TIMES DAILY AS NEEDED FOR ITCHING. 30 tablet 3   Insulin Syringe-Needle U-100 (BD INSULIN SYRINGE U/F) 31G X 5/16" 0.3 ML MISC USE WITH NOVOLIN TWICE DAY WITH A MEAL 100  each 5   nitroGLYCERIN (NITROSTAT) 0.4 MG SL tablet Place 0.4 mg under the tongue every 5 (five) minutes as needed for chest pain.     NOVOLIN 70/30 (70-30) 100 UNIT/ML injection INJECT 8 UNITS SQ IN THE MORNING AND INJECT 12 UNITS SQ AT BEDTIME 10 mL 11   OneTouch Delica Lancets 09B MISC USE TO CHECK BLOOD SUAGR 3 TIMES A DAY DX:E11.9 100 each 5   pantoprazole (PROTONIX) 40 MG tablet TAKE 1 TABLET BY MOUTH EVERY DAY 90 tablet 3   pravastatin (PRAVACHOL) 40 MG tablet TAKE 1 TABLET BY MOUTH EVERY DAY 90 tablet 1   predniSONE (DELTASONE) 20 MG tablet 3 tabs poqday 1-2, 2 tabs poqday 3-4, 1 tab poqday 5-6 12 tablet 0   tamsulosin (FLOMAX) 0.4 MG CAPS capsule TAKE 1 CAPSULE BY MOUTH DAILY AFTER SUPPER. 90 capsule 1   triamcinolone (KENALOG) 0.1 % APPLY TO AFFECTED AREA TWICE A DAY 454 g 3   No current facility-administered medications for this visit.     Past Medical History:  Diagnosis Date   Acute venous embolism and thrombosis of unspecified deep vessels of lower extremity    Atrial flutter (HCC)    and SVT that is post ablation   CAD (coronary artery disease)    Chronic kidney disease    stones   Coronary atherosclerosis 11/19/2008   Qualifier: Diagnosis of  ByStanford Breed, MD, Kandyce Rud    Dyslipidemia    GERD (gastroesophageal reflux disease)    HTN (hypertension)    Shortness of breath    on exertion    Past Surgical History:  Procedure Laterality Date   CARDIAC CATHETERIZATION     s/p cardiac catheterization and bypass surgery as well as EGD with dilation x3, left lower extremity vein stripping   CORONARY ARTERY BYPASS GRAFT     ESOPHAGOGASTRODUODENOSCOPY N/A 12/06/2013   Procedure: ESOPHAGOGASTRODUODENOSCOPY (EGD);  Surgeon: Lear Ng, MD;  Location: [facility];  Service: Endoscopy;  Laterality: N/A;   left knee anthroscopy     SECONDARY CLOSURE ARM     VIDEO BRONCHOSCOPY Bilateral 01/14/2018   Procedure: VIDEO BRONCHOSCOPY WITHOUT FLUORO;  Surgeon:  Tanda Rockers, MD;  Location: [facility];  Service: Cardiopulmonary;  Laterality: Bilateral;    Social History   Socioeconomic History   Marital status: Widowed    Spouse name: Not on file   Number of children: Not on file   Years of education: Not on file   Highest education level: Not on file  Occupational History   Not on file  Tobacco Use   Smoking status: Every Day    Packs/day: 1.00    Years: 60.00    Pack years: 60.00    Types: Cigarettes   Smokeless tobacco: Never  Substance and Sexual Activity   Alcohol use: No   Drug use: No   Sexual activity: Not on file  Other Topics Concern   Not on file  Social History Narrative   Lives in [place] with his wife and 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    Social Determinants of Health   Financial Resource Strain: Not on file  Food Insecurity: Not on file  Transportation Needs: Not on file  Physical Activity: Not on file  Stress: Not on file  Social Connections: Not on file  Intimate Partner Violence: Not on file    History reviewed. No pertinent family history.  ROS: no fevers or chills, productive cough, hemoptysis, dysphasia, odynophagia, melena, hematochezia, dysuria, hematuria, rash, seizure activity, orthopnea, PND, pedal edema, claudication. Remaining systems are negative.  Physical Exam: Well-developed well-nourished in no acute distress.  Skin is warm and dry.  HEENT is normal.  Neck is supple.  Chest is clear to auscultation with normal expansion.  Cardiovascular exam is regular rate and rhythm.  Abdominal exam nontender or distended. No masses palpated. Extremities show no edema. neuro grossly intact  ECG-sinus bradycardia at a rate of 58, no ST changes.  Personally reviewed  A/P  1 coronary artery disease-Continue aspirin and statin.  2 carotid artery disease-patient will need follow-up carotid Dopplers March 2023.  3  hypertension-blood pressure elevated;   4 hyperlipidemia-continue statin.  Note he did not tolerate higher doses previously.  5 tobacco abuse-patient counseled on discontinuing.  6 history of atrial flutter-no recurrences documented.  7 chest pain-symptoms are atypical.  Electrocardiogram shows no ST changes.  We will repeat [place] nuclear study for risk stratification.  Kirk Ruths, MD

## 2020-11-25 DIAGNOSIS — G4736 Sleep related hypoventilation in conditions classified elsewhere: Secondary | ICD-10-CM | POA: Diagnosis not present

## 2020-11-25 DIAGNOSIS — J449 Chronic obstructive pulmonary disease, unspecified: Secondary | ICD-10-CM | POA: Diagnosis not present

## 2020-11-30 DIAGNOSIS — Z9842 Cataract extraction status, left eye: Secondary | ICD-10-CM | POA: Diagnosis not present

## 2020-11-30 DIAGNOSIS — Z9841 Cataract extraction status, right eye: Secondary | ICD-10-CM | POA: Diagnosis not present

## 2020-11-30 DIAGNOSIS — E119 Type 2 diabetes mellitus without complications: Secondary | ICD-10-CM | POA: Diagnosis not present

## 2020-11-30 DIAGNOSIS — H353131 Nonexudative age-related macular degeneration, bilateral, early dry stage: Secondary | ICD-10-CM | POA: Diagnosis not present

## 2020-11-30 DIAGNOSIS — H02132 Senile ectropion of right lower eyelid: Secondary | ICD-10-CM | POA: Diagnosis not present

## 2020-11-30 DIAGNOSIS — H52213 Irregular astigmatism, bilateral: Secondary | ICD-10-CM | POA: Diagnosis not present

## 2020-11-30 DIAGNOSIS — H02135 Senile ectropion of left lower eyelid: Secondary | ICD-10-CM | POA: Diagnosis not present

## 2020-12-08 ENCOUNTER — Ambulatory Visit: Payer: Medicare HMO | Admitting: Cardiology

## 2020-12-08 ENCOUNTER — Encounter: Payer: Self-pay | Admitting: Cardiology

## 2020-12-08 ENCOUNTER — Other Ambulatory Visit: Payer: Self-pay

## 2020-12-08 VITALS — BP 150/62 | HR 58 | Ht 74.0 in | Wt 147.6 lb

## 2020-12-08 DIAGNOSIS — I10 Essential (primary) hypertension: Secondary | ICD-10-CM

## 2020-12-08 DIAGNOSIS — R072 Precordial pain: Secondary | ICD-10-CM | POA: Diagnosis not present

## 2020-12-08 DIAGNOSIS — I2510 Atherosclerotic heart disease of native coronary artery without angina pectoris: Secondary | ICD-10-CM

## 2020-12-08 DIAGNOSIS — E7800 Pure hypercholesterolemia, unspecified: Secondary | ICD-10-CM

## 2020-12-08 DIAGNOSIS — I679 Cerebrovascular disease, unspecified: Secondary | ICD-10-CM

## 2020-12-08 NOTE — Patient Instructions (Signed)
  Testing/Procedures:  Your physician has requested that you have a lexiscan myoview. For further information please visit [email or web address]. Please follow instruction sheet, as given. [place]   Follow-Up: At [facility], you and your health needs are our priority.  As part of our continuing mission to provide you with exceptional heart care, we have created designated Provider Care Teams.  These Care Teams include your primary Cardiologist (physician) and Advanced Practice Providers (APPs -  Physician Assistants and Nurse Practitioners) who all work together to provide you with the care you need, when you need it.  We recommend signing up for the patient portal called "MyChart".  Sign up information is provided on this After Visit Summary.  MyChart is used to connect with patients for Virtual Visits (Telemedicine).  Patients are able to view lab/test results, encounter notes, upcoming appointments, etc.  Non-urgent messages can be sent to your provider as well.   To learn more about what you can do with MyChart, go to [email or web address].    Your next appointment:   12 month(s)  The format for your next appointment:   In Person  Provider:   Kirk Ruths, MD

## 2020-12-16 ENCOUNTER — Other Ambulatory Visit: Payer: Self-pay | Admitting: Family Medicine

## 2020-12-16 DIAGNOSIS — E785 Hyperlipidemia, unspecified: Secondary | ICD-10-CM

## 2020-12-20 ENCOUNTER — Telehealth (HOSPITAL_COMMUNITY): Payer: Self-pay

## 2020-12-20 NOTE — Telephone Encounter (Signed)
Spoke with the patient's granddaughter, per DPR. She stated that she would have him her for his appointment. Asked to call back with any questions. S.Lateshia Schmoker EMTP

## 2020-12-25 DIAGNOSIS — G4736 Sleep related hypoventilation in conditions classified elsewhere: Secondary | ICD-10-CM | POA: Diagnosis not present

## 2020-12-26 ENCOUNTER — Ambulatory Visit (HOSPITAL_COMMUNITY): Payer: Medicare HMO | Attending: Cardiovascular Disease

## 2020-12-26 ENCOUNTER — Other Ambulatory Visit: Payer: Self-pay

## 2020-12-26 DIAGNOSIS — R072 Precordial pain: Secondary | ICD-10-CM | POA: Insufficient documentation

## 2020-12-26 LAB — MYOCARDIAL PERFUSION IMAGING
LV dias vol: 64 mL (ref 62–150)
LV sys vol: 29 mL
Peak HR: 85 {beats}/min
Rest HR: 71 {beats}/min
SDS: 0
SRS: 0
SSS: 0
TID: 0.99

## 2020-12-26 MED ORDER — TECHNETIUM TC 99M TETROFOSMIN IV KIT
32.2000 | PACK | Freq: Once | INTRAVENOUS | Status: AC | PRN
  Administered 2020-12-26: 32.2 via INTRAVENOUS
  Filled 2020-12-26: qty 33

## 2020-12-26 MED ORDER — TECHNETIUM TC 99M TETROFOSMIN IV KIT
10.1000 | PACK | Freq: Once | INTRAVENOUS | Status: AC | PRN
Start: 2020-12-26 — End: 2020-12-26
  Administered 2020-12-26: 10.1 via INTRAVENOUS
  Filled 2020-12-26: qty 11

## 2020-12-26 MED ORDER — REGADENOSON 0.4 MG/5ML IV SOLN
0.4000 mg | Freq: Once | INTRAVENOUS | Status: AC
  Administered 2020-12-26: 0.4 mg via INTRAVENOUS

## 2021-01-11 ENCOUNTER — Other Ambulatory Visit: Payer: Self-pay | Admitting: Family Medicine

## 2021-01-11 DIAGNOSIS — E785 Hyperlipidemia, unspecified: Secondary | ICD-10-CM

## 2021-01-11 MED ORDER — TRIAMCINOLONE ACETONIDE 0.1 % EX CREA: TOPICAL_CREAM | CUTANEOUS | 3 refills | Status: DC

## 2021-01-11 MED ORDER — PRAVASTATIN SODIUM 40 MG PO TABS: 40.0000 mg | ORAL_TABLET | Freq: Every day | ORAL | 1 refills | Status: DC

## 2021-01-11 MED ORDER — HYDROCODONE-ACETAMINOPHEN 5-325 MG PO TABS: 1.0000 | ORAL_TABLET | Freq: Four times a day (QID) | ORAL | 0 refills | Status: DC | PRN

## 2021-01-11 NOTE — Telephone Encounter (Signed)
Pt came in to request a refill of HYDROcodone-acetaminophen (NORCO) 5-325 MG tablet  pantoprazole (PROTONIX) 40 MG tablet  triamcinolone (KENALOG) 0.1 % .

## 2021-01-25 DIAGNOSIS — J449 Chronic obstructive pulmonary disease, unspecified: Secondary | ICD-10-CM | POA: Diagnosis not present

## 2021-01-25 DIAGNOSIS — G4736 Sleep related hypoventilation in conditions classified elsewhere: Secondary | ICD-10-CM | POA: Diagnosis not present

## 2021-01-28 ENCOUNTER — Other Ambulatory Visit: Payer: Self-pay | Admitting: Family Medicine

## 2021-01-29 ENCOUNTER — Ambulatory Visit (INDEPENDENT_AMBULATORY_CARE_PROVIDER_SITE_OTHER): Payer: Medicare HMO | Admitting: Family Medicine

## 2021-01-29 ENCOUNTER — Encounter: Payer: Self-pay | Admitting: Family Medicine

## 2021-01-29 VITALS — BP 134/60 | HR 74 | Temp 99.9°F | Resp 18 | Ht 74.0 in | Wt 140.0 lb

## 2021-01-29 DIAGNOSIS — Z794 Long term (current) use of insulin: Secondary | ICD-10-CM

## 2021-01-29 DIAGNOSIS — E785 Hyperlipidemia, unspecified: Secondary | ICD-10-CM | POA: Diagnosis not present

## 2021-01-29 DIAGNOSIS — R300 Dysuria: Secondary | ICD-10-CM

## 2021-01-29 DIAGNOSIS — E1140 Type 2 diabetes mellitus with diabetic neuropathy, unspecified: Secondary | ICD-10-CM | POA: Diagnosis not present

## 2021-01-29 DIAGNOSIS — Z125 Encounter for screening for malignant neoplasm of prostate: Secondary | ICD-10-CM | POA: Diagnosis not present

## 2021-01-29 LAB — URINALYSIS, ROUTINE W REFLEX MICROSCOPIC
Bacteria, UA: NONE SEEN /HPF
Bilirubin Urine: NEGATIVE
Glucose, UA: NEGATIVE
Hgb urine dipstick: NEGATIVE
Hyaline Cast: NONE SEEN /LPF
Ketones, ur: NEGATIVE
Leukocytes,Ua: NEGATIVE
Nitrite: NEGATIVE
RBC / HPF: NONE SEEN /HPF (ref 0–2)
Specific Gravity, Urine: 1.02 (ref 1.001–1.035)
WBC, UA: NONE SEEN /HPF (ref 0–5)
pH: 5.5 (ref 5.0–8.0)

## 2021-01-29 LAB — MICROSCOPIC MESSAGE

## 2021-01-29 MED ORDER — TAMSULOSIN HCL 0.4 MG PO CAPS: 0.4000 mg | ORAL_CAPSULE | Freq: Every day | ORAL | 1 refills | Status: DC

## 2021-01-29 MED ORDER — PRAVASTATIN SODIUM 40 MG PO TABS: 40.0000 mg | ORAL_TABLET | Freq: Every day | ORAL | 1 refills | Status: DC

## 2021-01-29 MED ORDER — CIPROFLOXACIN HCL 500 MG PO TABS: 500.0000 mg | ORAL_TABLET | Freq: Two times a day (BID) | ORAL | 0 refills | Status: AC

## 2021-01-29 NOTE — Progress Notes (Signed)
Subjective:    Patient ID: JOSEPHUS HARRIGER, male    DOB: Oct 13, 1934, 86 y.o.   MRN: [id number]  HPI   Patient is a very kind 86 year old Caucasian male who is here today reporting difficulty with urination.  He states that over the last 2 weeks, he has had increased nocturia.  He goes to the bathroom and it burns when he pees.  He reports pain deep in his pelvis whenever he urinates and also burning at the tip of his penis.  He denies any hematuria or fevers or chills.  He denies any back pain.  He denies any nausea or vomiting or diarrhea.  Urinalysis today is negative for any leukocyte esterase, nitrates, or blood.  He is checking his sugars occasionally.  He states that his blood sugars recently were in the 200s however that was during a period of tremendous stress when his granddaughter was killed in an automobile accident.  He states that with dietary changes he is got his blood sugars back under 200 however he is long overdue for lab work.  Patient denies a weak stream.  Although he does report hesitancy Past Medical History:  Diagnosis Date  . Acute venous embolism and thrombosis of unspecified deep vessels of lower extremity   . Atrial flutter (HCC)    and SVT that is post ablation  . CAD (coronary artery disease)   . Chronic kidney disease    stones  . Coronary atherosclerosis 11/19/2008   Qualifier: Diagnosis of  By: Stanford Breed, MD, Kandyce Rud   . Dyslipidemia   . GERD (gastroesophageal reflux disease)   . HTN (hypertension)   . Shortness of breath    on exertion   Past Surgical History:  Procedure Laterality Date  . CARDIAC CATHETERIZATION     s/p cardiac catheterization and bypass surgery as well as EGD with dilation x3, left lower extremity vein stripping  . CORONARY ARTERY BYPASS GRAFT    . ESOPHAGOGASTRODUODENOSCOPY N/A 12/06/2013   Procedure: ESOPHAGOGASTRODUODENOSCOPY (EGD);  Surgeon: Lear Ng, MD;  Location: [facility];  Service: Endoscopy;   Laterality: N/A;  . left knee anthroscopy    . SECONDARY CLOSURE ARM    . VIDEO BRONCHOSCOPY Bilateral 01/14/2018   Procedure: VIDEO BRONCHOSCOPY WITHOUT FLUORO;  Surgeon: Tanda Rockers, MD;  Location: [facility];  Service: Cardiopulmonary;  Laterality: Bilateral;   Current Outpatient Medications on File Prior to Visit  Medication Sig Dispense Refill  . aspirin 325 MG tablet Take 325 mg by mouth daily.    . blood glucose meter kit and supplies Dispense based on patient and insurance preference. Use up to four times daily as directed. (FOR ICD-10 E10.9, E11.9). 1 each 0  . clobetasol cream (TEMOVATE) 0.05 % Please specify directions, refills and quantity 1 g 0  . clotrimazole (LOTRIMIN) 1 % cream Apply 1 application topically 2 (two) times daily. Apply to the head of the penis 30 g 3  . glucose blood (ONETOUCH ULTRA) test strip USE TO CHECK BLOOD SUGAR 4  TIMES DAILY. E11.9 100 strip 9  . HYDROcodone-acetaminophen (NORCO) 5-325 MG tablet Take 1 tablet by mouth every 6 (six) hours as needed for moderate pain. 30 tablet 0  . hydrOXYzine (ATARAX/VISTARIL) 25 MG tablet TAKE 1 TABLET BY MOUTH 3 TIMES DAILY AS NEEDED FOR ITCHING. 30 tablet 3  . Insulin Syringe-Needle U-100 (BD INSULIN SYRINGE U/F) 31G X 5/16" 0.3 ML MISC USE WITH NOVOLIN TWICE DAY WITH A MEAL 100 each 5  .  nitroGLYCERIN (NITROSTAT) 0.4 MG SL tablet Place 0.4 mg under the tongue every 5 (five) minutes as needed for chest pain.    Marland Kitchen NOVOLIN 70/30 (70-30) 100 UNIT/ML injection INJECT 8 UNITS SQ IN THE MORNING AND INJECT 12 UNITS SQ AT BEDTIME 10 mL 11  . OneTouch Delica Lancets 07E MISC USE TO CHECK BLOOD SUAGR 3 TIMES A DAY DX:E11.9 100 each 5  . pantoprazole (PROTONIX) 40 MG tablet TAKE 1 TABLET BY MOUTH EVERY DAY 90 tablet 3  . triamcinolone cream (KENALOG) 0.1 % APPLY TO AFFECTED AREA TWICE A DAY 454 g 3   No current facility-administered medications on file prior to visit.   Allergies  Allergen Reactions  . Celebrex  [Celecoxib] Rash and Other (See Comments)    Blister   Social History   Socioeconomic History  . Marital status: Widowed    Spouse name: Not on file  . Number of children: Not on file  . Years of education: Not on file  . Highest education level: Not on file  Occupational History  . Not on file  Tobacco Use  . Smoking status: Every Day    Packs/day: 1.00    Years: 60.00    Pack years: 60.00    Types: Cigarettes  . Smokeless tobacco: Never  Substance and Sexual Activity  . Alcohol use: No  . Drug use: No  . Sexual activity: Not on file  Other Topics Concern  . 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    Social Determinants of Health   Financial Resource Strain: Not on file  Food Insecurity: Not on file  Transportation Needs: Not on file  Physical Activity: Not on file  Stress: Not on file  Social Connections: Not on file  Intimate Partner Violence: Not on file      Review of Systems     Objective:   Physical Exam Constitutional:      General: He is not in acute distress.    Appearance: Normal appearance. He is normal weight. He is not ill-appearing or toxic-appearing.  Cardiovascular:     Rate and Rhythm: Normal rate and regular rhythm.     Heart sounds: Normal heart sounds. No murmur heard.   No friction rub. No gallop.  Pulmonary:     Effort: Pulmonary effort is normal.     Breath sounds: Normal breath sounds.  Abdominal:     General: Abdomen is flat. Bowel sounds are normal. There is no distension.     Palpations: Abdomen is soft.     Tenderness: There is no abdominal tenderness. There is no guarding.  Neurological:     Mental Status: He is alert.  Bilateral carotid bruit       Assessment & Plan:  Dysuria - Plan: Urinalysis, Routine w reflex microscopic, PSA  Hyperlipidemia - Plan: pravastatin (PRAVACHOL) 40 MG  tablet  Type 2 diabetes mellitus with diabetic neuropathy, with long-term current use of insulin (HCC) - Plan: tamsulosin (FLOMAX) 0.4 MG CAPS capsule, pravastatin (PRAVACHOL) 40 MG tablet, Hemoglobin A1c, COMPLETE METABOLIC PANEL WITH GFR Urinalysis is negative.  Therefore the question is whether these are lower urinary tract symptoms related to BPH or prostate infection or perhaps an enlarging prostate due to malignancy.  I recommended a digital rectal exam today but he declined.  He would like to try Cipro 500 mg  twice daily for 10 days for possible prostatitis first before performing a rectal exam.  I will check a PSA given his refusal for digital rectal exam.  While checking lab work also will check a CBC CMP and a hemoglobin A1c given his history of diabetes to ensure that his blood sugars are adequately controlled.  Recheck in 10 days if no better or sooner if worsening

## 2021-01-30 LAB — COMPLETE METABOLIC PANEL WITH GFR
AG Ratio: 1.1 (calc) (ref 1.0–2.5)
ALT: 6 U/L — ABNORMAL LOW (ref 9–46)
AST: 11 U/L (ref 10–35)
Albumin: 3.7 g/dL (ref 3.6–5.1)
Alkaline phosphatase (APISO): 63 U/L (ref 35–144)
BUN/Creatinine Ratio: 20 (calc) (ref 6–22)
BUN: 29 mg/dL — ABNORMAL HIGH (ref 7–25)
CO2: 25 mmol/L (ref 20–32)
Calcium: 9.1 mg/dL (ref 8.6–10.3)
Chloride: 106 mmol/L (ref 98–110)
Creat: 1.45 mg/dL — ABNORMAL HIGH (ref 0.70–1.22)
Globulin: 3.5 g/dL (calc) (ref 1.9–3.7)
Glucose, Bld: 222 mg/dL — ABNORMAL HIGH (ref 65–99)
Potassium: 5.2 mmol/L (ref 3.5–5.3)
Sodium: 138 mmol/L (ref 135–146)
Total Bilirubin: 0.4 mg/dL (ref 0.2–1.2)
Total Protein: 7.2 g/dL (ref 6.1–8.1)
eGFR: 48 mL/min/{1.73_m2} — ABNORMAL LOW (ref >=60)

## 2021-01-30 LAB — PSA: PSA: 0.86 ng/mL (ref <=4.00)

## 2021-01-30 LAB — HEMOGLOBIN A1C
Hgb A1c MFr Bld: 7.7 % of total Hgb — ABNORMAL HIGH (ref <5.7)
Mean Plasma Glucose: 174 mg/dL
eAG (mmol/L): 9.7 mmol/L

## 2021-02-08 DIAGNOSIS — H16212 Exposure keratoconjunctivitis, left eye: Secondary | ICD-10-CM | POA: Diagnosis not present

## 2021-02-08 DIAGNOSIS — H16142 Punctate keratitis, left eye: Secondary | ICD-10-CM | POA: Diagnosis not present

## 2021-02-19 ENCOUNTER — Ambulatory Visit: Payer: Medicare HMO | Admitting: Internal Medicine

## 2021-02-25 DIAGNOSIS — J449 Chronic obstructive pulmonary disease, unspecified: Secondary | ICD-10-CM | POA: Diagnosis not present

## 2021-02-25 DIAGNOSIS — G4736 Sleep related hypoventilation in conditions classified elsewhere: Secondary | ICD-10-CM | POA: Diagnosis not present

## 2021-02-27 ENCOUNTER — Ambulatory Visit: Payer: Medicare HMO | Admitting: Internal Medicine

## 2021-03-01 ENCOUNTER — Ambulatory Visit: Payer: Medicare HMO | Admitting: Internal Medicine

## 2021-03-07 ENCOUNTER — Other Ambulatory Visit: Payer: Self-pay

## 2021-03-07 ENCOUNTER — Ambulatory Visit: Payer: Medicare HMO | Admitting: Internal Medicine

## 2021-03-07 ENCOUNTER — Encounter: Payer: Self-pay | Admitting: Internal Medicine

## 2021-03-07 DIAGNOSIS — J449 Chronic obstructive pulmonary disease, unspecified: Secondary | ICD-10-CM | POA: Diagnosis not present

## 2021-03-07 DIAGNOSIS — F17210 Nicotine dependence, cigarettes, uncomplicated: Secondary | ICD-10-CM | POA: Diagnosis not present

## 2021-03-07 DIAGNOSIS — R918 Other nonspecific abnormal finding of lung field: Secondary | ICD-10-CM

## 2021-03-07 DIAGNOSIS — R69 Illness, unspecified: Secondary | ICD-10-CM | POA: Diagnosis not present

## 2021-03-07 NOTE — Patient Instructions (Signed)
No change in recommendations  Please schedule a follow up visit in 3 months but call sooner if needed

## 2021-03-07 NOTE — Assessment & Plan Note (Signed)
Active smoker - Spirometry 06/03/2018  FEV1 2.6 (77%)  Ratio 72 with atypical curvature - 06/03/2018   Walked RA  2 laps @ 233ft each @ slow to avg pace  stopped due to end of study, minimal sob  -  01/25/2020   Walked RA  2 laps @ approx 255ft each @ moderate pace  stopped due to end of study,   no sob - sats still 96% at end   Mainly CB with much AB> rx mucinex dm/ stop smoking

## 2021-03-07 NOTE — Progress Notes (Signed)
[name], male    DOB: 1935-04-26     MRN: [id number]    Brief patient profile:  17 yowm active smoker with new onset sob/cough x 2014 worse since April 2019  Assoc with chronic dysphagia with documented mild silent aspiration by DgEs 08/29/17 and not able to swallow barium tablet.  Referred 01/08/2018 by Dr Dennard Schaumann for eval infiltrates on cxr RLL ant basal segment first noted 12/11/17 with suggestion of mucus plugging by CT chest 12/24/17    History of Present Illness  01/08/2018 1st pulmonary office eval/ [name] Chief Complaint  Patient presents with   Pulmonary Consult    Referred by Dr. Dennard Schaumann. Pt states he has had recurrent PNA since April 2019. He states he always feels SOB with or without exertion. He is using albuterol nebs 2 x daily.    Dyspnea: uphill when walks back from mailbox= MMRC1 = can walk nl pace, flat grade, can't hurry or go uphills or steps s sob    Cough: rattling / worse since April  2019 some better with abx transiently each time  / min mucoid sputum esp in am  Sleep: sleeps prone SABA use: not sure they help  Chronic dysphagia, lost about 10 lb since onset of coughing April 2019  Already eval by ST and Gdaughter says he's been following instructions but not helping swallowing rec The key is to stop smoking completely before smoking completely stops you!  Best cough medication mucinex 1200 mg every 12 hours as needed         03/05/2018  f/u ov/[name] re: cough /dysphagia  Chief Complaint  Patient presents with   Follow-up    Breathing has improved. He has occ cough with white sputum. He has not needed his neb or combivent inhaler.  Dyspnea:  MMRC1 = can walk nl pace, flat grade, can't hurry or go uphills or steps s sob  Improved mb to house  Cough: better / some white in am/ still occ choking on food  Sleeping:  Lies flat/ one pillow SABA use: none used - has combivent not using  02: no  rec Increase pantoprazole to 40 mg Take 30- 60 min before your  first and last meals of the day  GERDrx  If throat not some better after 4 weeks on this plan then call me for referral to ENT       01/25/2020  f/u ov/[name] re:  GOLD 0 copd/ still smoking / consult Dr Dennard Schaumann re RUL nodule  Chief Complaint  Patient presents with   Consult    Referred  by Dr. Dennard Schaumann-  concerned about recent CT Chest done 12/31/19- nodule. He states breathing has been worse since the last visit. He has occ cough- sometimes prod with white to yellow sputum.   Dyspnea:  MMRC3 = can't walk 100 yards even at a slow pace at a flat grade s stopping due to sob   Cough: some more than usual x sev months / never bloody but sometime yellow  Sleeping: ok flat in bed  SABA use: none  02: none  rec cipro 500 mg twice daily x 10 days     CT chest 05/12/20  17 mm thick-walled cavitary lesion in the anterior right upper lobe, unchanged, suspicious for primary bronchogenic neoplasm such as squamous cell carcinoma.  No findings specific for metastatic disease.> rec fob    06/26/20  Called Dr Valeta Harms to cancel FOB  Copd   08/21/2020  f/u ov/[name] re:  copd/ lung nodule Chief Complaint  Patient presents with   Follow-up    SOB and coughing   Dyspnea: still able to shop/ drive but very slow pace  Cough: nothing bloody  Sleeping:  Flat/ one pillow  SABA use: none  02: none Covid status:   All 3 vax  Rec The key is to stop smoking completely before smoking completely stops you! Please remember to go to the lab and x-ray department  for your tests - we will call you with the results when they are available.   03/07/2021  f/u ov/[name] re: GOLD 0 copd    maint on no rx   Chief Complaint  Patient presents with   Follow-up    Patient reports increased short of breath with exertion.    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   Sleeping  without nocturnal exacerbation  of respiratory  c/o's or need for noct saba. Also denies any obvious fluctuation of symptoms with weather or environmental changes or other aggravating or alleviating factors except as outlined above   No unusual exposure hx or h/o childhood pna/ asthma or knowledge of premature birth.  Current Allergies, Complete Past Medical History, Past Surgical History, Family History, and Social History were reviewed in [employer] [employer] record.  ROS  The following are not active complaints unless bolded Hoarseness, sore throat, dysphagia, dental problems, itching, sneezing,  nasal congestion or discharge of excess mucus or purulent secretions, ear ache,   fever, chills, sweats, unintended wt loss or wt gain, classically pleuritic or exertional cp,  orthopnea pnd or arm/hand swelling  or leg swelling, presyncope, palpitations, abdominal pain, anorexia, nausea, vomiting, diarrhea  or change in bowel habits or change in bladder habits, change in stools or change in urine, dysuria, hematuria,  rash, arthralgias, visual complaints, headache, numbness, weakness or ataxia or problems with walking or coordination,  change in mood or  memory.        Current Meds  Medication Sig   aspirin 325 MG tablet Take 325 mg by mouth daily.   blood glucose meter kit and supplies Dispense based on patient and insurance preference. Use up to four times daily as directed. (FOR ICD-10 E10.9, E11.9).   clobetasol cream (TEMOVATE) 0.05 % Please specify directions, refills and quantity   clotrimazole (LOTRIMIN) 1 % cream Apply 1 application topically 2 (two) times daily. Apply to the head of the penis   glucose blood (ONETOUCH ULTRA) test strip USE TO CHECK BLOOD SUGAR 4  TIMES DAILY. E11.9   HYDROcodone-acetaminophen (NORCO)  5-325 MG tablet Take 1 tablet by mouth every 6 (six) hours as needed for moderate pain.   hydrOXYzine (ATARAX/VISTARIL) 25 MG tablet TAKE 1 TABLET BY MOUTH 3 TIMES DAILY AS NEEDED FOR ITCHING.   Insulin Syringe-Needle U-100 (BD INSULIN SYRINGE U/F) 31G X 5/16" 0.3 ML MISC USE WITH NOVOLIN TWICE DAY WITH A MEAL   nitroGLYCERIN (NITROSTAT) 0.4 MG SL tablet Place 0.4 mg under the tongue every 5 (five) minutes as needed for chest pain.   NOVOLIN 70/30 (70-30) 100 UNIT/ML injection INJECT 8 UNITS SQ IN THE MORNING AND INJECT  12 UNITS SQ AT BEDTIME   OneTouch Delica Lancets 96E MISC USE TO CHECK BLOOD SUAGR 3 TIMES A DAY DX:E11.9   pantoprazole (PROTONIX) 40 MG tablet TAKE 1 TABLET BY MOUTH EVERY DAY   pravastatin (PRAVACHOL) 40 MG tablet Take 1 tablet (40 mg total) by mouth daily.   tamsulosin (FLOMAX) 0.4 MG CAPS capsule Take 1 capsule (0.4 mg total) by mouth daily.   triamcinolone cream (KENALOG) 0.1 % APPLY TO AFFECTED AREA TWICE A DAY                    Objective:       03/07/2021        139  08/21/2020        145  01/25/2020          152  06/03/2018      138   03/05/2018       143   01/08/18 138 lb (62.6 kg)  12/23/17 144 lb (65.3 kg)     Vital signs reviewed  03/07/2021  - Note at rest 02 sats  99% on RA   General appearance:    chronic cough       HEENT : pt wearing mask not removed for exam due to covid - 19 concerns.    NECK :  without JVD/Nodes/TM/ nl carotid upstrokes bilaterally   LUNGS: no acc muscle use,  Mild barrel  contour chest wall with bilateral  Distant bs s audible wheeze and  without cough on insp or exp maneuvers  and mild  Hyperresonant  to  percussion bilaterally     CV:  RRR  no s3 or murmur or increase in P2, and no edema   ABD:  soft and nontender with pos end  insp Hoover's  in the supine position. No bruits or organomegaly appreciated, bowel sounds nl  MS:   Nl gait/  ext warm without deformities, calf tenderness, cyanosis or clubbing No obvious  joint restrictions   SKIN: warm and dry without lesions    NEURO:  alert, approp, nl sensorium with  no motor or cerebellar deficits apparent.                          Assessment

## 2021-03-07 NOTE — Assessment & Plan Note (Signed)
Onset spring 2019 assoc with documented aspiration on DgEs 08/2017 in pt with poor cough mechanics  - cxr changes 12/11/17 no better 01/08/2018 with "mucus plugs" by CT chest 12/23/17 in active smoker - REC fob 01/14/18 > copious retained mucus> tbbx c/w organizing pna/ neg cyt/ neg gm stain / few pseudomonas sensitive to cipro on culture so rec on 01/19/18 start  750 bid x 10 days then ov in 2 weeks with cxr> marked improvement on cxr 03/05/2018  - CT 12/31/19 new RUL/apical  partially cavitary nodule  - 01/25/2020 rx cipro 500 mg bid x 10 days then cxr at 2 weeks and if not change then PET to follow - PET  done 02/18/20: 1. Mild FDG uptake associated with cavitary lesion in the RIGHT upper lobe. Infection or neoplasm could have this appearance. Bronchogenic neoplasm remains in the differential with hypermetabolic RIGHT hilar nodal tissue. 2. RIGHT lower lobe process including small lymph nodes along the bronchovascular structures compatible with infectious or inflammatory changes, associated with tree-in-bud opacities in the RIGHT lower lobe. The extent to which nodal disease in the chest is related to potential neoplasm or inflammation is unclear. Would also correlate with any respiratory symptoms or risk factors for Aspiration   3. Vallecular activity with suggestion of subtle soft tissue thickening at the base of the epiglottis >>> rec repeat CT chest in 3 months vs refer for navigational bx vs ebus  - CT 05/12/20 17 mm thick-walled cavitary lesion in the anterior right upper lobe, unchanged, suspicious for primary bronchogenic neoplasm such as squamous cell carcinoma. > referred for navigational bx with ebus  Also  > declined  - Quant TB 08/21/2020  Neg  - Histo ag 08/21/2020  Neg  - Crypto Ang 08/21/2020  Neg  Again declined bx p discussion of risk and potential benefit .

## 2021-03-07 NOTE — Assessment & Plan Note (Signed)
Counseled re importance of smoking cessation but did not meet time criteria for separate billing            Each maintenance medication was reviewed in detail including emphasizing most importantly the difference between maintenance and prns and under what circumstances the prns are to be triggered using an action plan format where appropriate.  Total time for H and P, chart review, counseling,  and generating customized AVS unique to this office visit / same day charting = 22 min

## 2021-03-08 ENCOUNTER — Encounter: Payer: Self-pay | Admitting: Family Medicine

## 2021-03-09 ENCOUNTER — Other Ambulatory Visit: Payer: Self-pay | Admitting: Family Medicine

## 2021-03-09 DIAGNOSIS — R911 Solitary pulmonary nodule: Secondary | ICD-10-CM

## 2021-03-13 ENCOUNTER — Ambulatory Visit
Admission: RE | Admit: 2021-03-13 | Discharge: 2021-03-13 | Disposition: A | Discharge status: Home / Self Care | Payer: Medicare HMO | Source: Ambulatory Visit | Attending: Family Medicine | Admitting: Family Medicine

## 2021-03-13 DIAGNOSIS — I700 Atherosclerosis of aorta: Secondary | ICD-10-CM | POA: Diagnosis not present

## 2021-03-13 DIAGNOSIS — R911 Solitary pulmonary nodule: Secondary | ICD-10-CM

## 2021-03-13 DIAGNOSIS — J439 Emphysema, unspecified: Secondary | ICD-10-CM | POA: Diagnosis not present

## 2021-03-13 DIAGNOSIS — R918 Other nonspecific abnormal finding of lung field: Secondary | ICD-10-CM | POA: Diagnosis not present

## 2021-03-27 DIAGNOSIS — J449 Chronic obstructive pulmonary disease, unspecified: Secondary | ICD-10-CM | POA: Diagnosis not present

## 2021-03-29 ENCOUNTER — Ambulatory Visit (INDEPENDENT_AMBULATORY_CARE_PROVIDER_SITE_OTHER): Payer: Medicare HMO | Admitting: *Deleted

## 2021-03-29 DIAGNOSIS — Z23 Encounter for immunization: Secondary | ICD-10-CM | POA: Diagnosis not present

## 2021-04-11 ENCOUNTER — Telehealth: Payer: Self-pay | Admitting: *Deleted

## 2021-04-11 NOTE — Chronic Care Management (AMB) (Signed)
  Chronic Care Management   Note  04/11/2021 Name: [name] MRN: [id number] DOB: 02-18-1935  [name] is a 86 y.o. year old male who is a primary care patient of Dennard Schaumann, Cammie Mcgee, MD. I reached out to Mancel Bale by phone today in response to a referral sent by [name]'s PCP.  Mr. Goethe was given information about Chronic Care Management services today including:  CCM service includes personalized support from designated clinical staff supervised by his physician, including individualized plan of care and coordination with other care providers 24/7 contact phone numbers for assistance for urgent and routine care needs. Service will only be billed when office clinical staff spend 20 minutes or more in a month to coordinate care. Only one practitioner may furnish and bill the service in a calendar month. The patient may stop CCM services at any time (effective at the end of the month) by phone call to the office staff. The patient is responsible for co-pay (up to 20% after annual deductible is met) if co-pay is required by the individual health plan.   Granddaughter Allegra Grana DPR on file  verbally agreed to assistance and services provided by embedded care coordination/care management team today.  Follow up plan: Telephone appointment with care management team member scheduled for:04/23/21  [facility]  Direct Dial: [phone]

## 2021-04-19 ENCOUNTER — Other Ambulatory Visit: Payer: Self-pay | Admitting: Family Medicine

## 2021-04-23 ENCOUNTER — Ambulatory Visit (INDEPENDENT_AMBULATORY_CARE_PROVIDER_SITE_OTHER): Payer: Medicare HMO | Admitting: *Deleted

## 2021-04-23 DIAGNOSIS — E1110 Type 2 diabetes mellitus with ketoacidosis without coma: Secondary | ICD-10-CM | POA: Diagnosis not present

## 2021-04-23 DIAGNOSIS — J449 Chronic obstructive pulmonary disease, unspecified: Secondary | ICD-10-CM | POA: Diagnosis not present

## 2021-04-23 NOTE — Patient Instructions (Signed)
Visit Information   PATIENT GOALS:   Goals Addressed             This Visit's Progress    Monitor and Manage My Blood Sugar-Diabetes Type 2       Timeframe:  Long-Range Goal Priority:  Medium Start Date:             04/23/2021                Expected End Date:     10/21/2021                  Follow Up Date 06/08/2021   - check blood sugar at prescribed times - check blood sugar if I feel it is too high or too low - take the blood sugar log to all doctor visits - take the blood sugar meter to all doctor visits  - look over education sent via My Chart- hypoglycemia - be mindful of carbohydrate intake- too much starchy foods at one meal will elevate blood sugar- foods such as bread, pasta, rice, potatoes.   Why is this important?   Checking your blood sugar at home helps to keep it from getting very high or very low.  Writing the results in a diary or log helps the doctor know how to care for you.  Your blood sugar log should have the time, date and the results.  Also, write down the amount of insulin or other medicine that you take.  Other information, like what you ate, exercise done and how you were feeling, will also be helpful.     Notes:      Track and Manage My Symptoms-COPD       Timeframe:  Long-Range Goal Priority:  Medium Start Date:        04/23/2021                     Expected End Date:         10/21/2021              Follow Up Date 06/08/2021   - develop a rescue plan/ know how you feel each day, call doctor early on for change in health status, symptoms - eliminate symptom triggers at home, consider stopping or cutting down smoking cigarettes - follow rescue plan if symptoms flare-up - keep follow-up appointments  - use oxygen as prescribed, do not smoke around oxygen - practice good handwashing and wear a mask as needed   Why is this important?   Tracking your symptoms and other information about your health helps your doctor plan your care.  Write down  the symptoms, the time of day, what you were doing and what medicine you are taking.  You will soon learn how to manage your symptoms.     Notes:         Consent to CCM Services: Mr. Lona was given information about Chronic Care Management services including:  CCM service includes personalized support from designated clinical staff supervised by his physician, including individualized plan of care and coordination with other care providers 24/7 contact phone numbers for assistance for urgent and routine care needs. Service will only be billed when office clinical staff spend 20 minutes or more in a month to coordinate care. Only one practitioner may furnish and bill the service in a calendar month. The patient may stop CCM services at any time (effective at the end of the month) by phone call to the  office staff. The patient will be responsible for cost sharing (co-pay) of up to 20% of the service fee (after annual deductible is met).  Patient agreed to services and verbal consent obtained.   Patient verbalizes understanding of instructions provided today and agrees to view in [place].   Telephone follow up appointment with care management team member scheduled for:    06/08/2021  COPD Action Plan A COPD action plan is a description of what to do when you have a flare (exacerbation) of chronic obstructive pulmonary disease (COPD). Your action plan is a color-coded plan that lists the symptoms that indicate whether your condition is under control and what actions to take. If you have symptoms in the green zone, it means you are doing well that day. If you have symptoms in the yellow zone, it means you are having a bad day or an exacerbation. If you have symptoms in the red zone, you need urgent medical care. Follow the plan that you and your health care provider developed. Review your plan with your health care provider at each visit. Red zone Symptoms in this zone mean that you  should get medical help right away. They include: Feeling very short of breath, even when you are resting. Not being able to do any activities because of poor breathing. Not being able to sleep because of poor breathing. Fever or shaking chills. Feeling confused or very sleepy. Chest pain. Coughing up blood. If you have any of these symptoms, call emergency services (911 in the [place] or go to the nearest emergency room. Yellow zone Symptoms in this zone mean that your condition may be getting worse. They include: Feeling more short of breath than usual. Having less energy for daily activities than usual. Phlegm or mucus that is thicker than usual. Needing to use your rescue inhaler or nebulizer more often than usual. More ankle swelling than usual. Coughing more than usual. Feeling like you have a chest cold. Trouble sleeping due to COPD symptoms. Decreased appetite. COPD medicines not helping as much as usual. If you experience any "yellow" symptoms: Keep taking your daily medicines as directed. Use your quick-relief inhaler as told by your health care provider. If you were prescribed steroid medicine to take by mouth (oral medicine), start taking it as told by your health care provider. If you were prescribed an antibiotic medicine, start taking it as told by your health care provider. Do not stop taking the antibiotic even if you start to feel better. Use oxygen as told by your health care provider. Get more rest. Do your pursed-lip breathing exercises. Do not smoke. Avoid any irritants in the air. If your signs and symptoms do not improve after taking these steps, call your health care provider right away. Green zone Symptoms in this zone mean that you are doing well. They include: Being able to do your usual activities and exercise. Having the usual amount of coughing, including the same amount of phlegm or mucus. Being able to sleep well. Having a good appetite. Where to  find more information: You can find more information about COPD from: American Lung Association, My COPD Action Plan: www.lung.org COPD Foundation: www.copdfoundation.[place]: https://wilson-eaton.com/ Follow these instructions at home: Continue taking your daily medicines as told by your health care provider. Make sure you receive all the immunizations that your health care provider recommends, especially the pneumococcal and influenza vaccines. Wash your hands often with soap and water. Have family members wash their  hands too. Regular hand washing can help prevent infections. Follow your usual exercise and diet plan. Avoid irritants in the air, such as smoke. Do not use any products that contain nicotine or tobacco. These products include cigarettes, chewing tobacco, and vaping devices, such as e-cigarettes. If you need help quitting, ask your health care provider. Summary A COPD action plan tells you what to do when you have a flare (exacerbation) of chronic obstructive pulmonary disease (COPD). Follow each action plan for your symptoms. If you have any symptoms in the red zone, call emergency services (911 in the [place] or go to the nearest emergency room. This information is not intended to replace advice given to you by your health care provider. Make sure you discuss any questions you have with your health care provider. Document Revised: 04/18/2020 Document Reviewed: 04/18/2020 Elsevier Patient Education  2022 [place]. Hypoglycemia Hypoglycemia is when the sugar (glucose) level in your blood is too low. Low blood sugar can happen to people who have diabetes and people who do not have diabetes. Low blood sugar can happen quickly, and it can be an emergency. What are the causes? This condition happens most often in people who have diabetes. It may be caused by: Diabetes medicine. Not eating enough, or not eating often enough. Doing more physical  activity. Drinking alcohol on an empty stomach. If you do not have diabetes, this condition may be caused by: A tumor in the pancreas. Not eating enough, or not eating for long periods at a time (fasting). A very bad infection or illness. Problems after having weight loss (bariatric) surgery. Kidney failure or liver failure. Certain medicines. What increases the risk? This condition is more likely to develop in people who: Have diabetes and take medicines to lower their blood sugar. Abuse alcohol. Have a very bad illness. What are the signs or symptoms? Mild Hunger. Sweating and feeling clammy. Feeling dizzy or light-headed. Being sleepy or having trouble sleeping. Feeling like you may vomit (nauseous). A fast heartbeat. A headache. Blurry vision. Mood changes, such as: Being grouchy. Feeling worried or nervous (anxious). Tingling or loss of feeling (numbness) around your mouth, lips, or tongue. Moderate Confusion and poor judgment. Behavior changes. Weakness. Uneven heartbeat. Trouble with moving (coordination). Very low Very low blood sugar (severe hypoglycemia) is a medical emergency. It can cause: Fainting. Seizures. Loss of consciousness (coma). Death. How is this treated? Treating low blood sugar Low blood sugar is often treated by eating or drinking something that has sugar in it right away. The food or drink should contain 15 grams of a fast-acting carb (carbohydrate). Options include: 4 oz (120 mL) of fruit juice. 4 oz (120 mL) of regular soda (not diet soda). A few pieces of hard candy. Check food labels to see how many pieces to eat for 15 grams. 1 Tbsp (15 mL) of sugar or honey. 4 glucose tablets. 1 tube of glucose gel. Treating low blood sugar if you have diabetes If you can think clearly and swallow safely, follow the 15:15 rule: Take 15 grams of a fast-acting carb. Talk with your doctor about how much you should take. Always keep a source of  fast-acting carb with you, such as: Glucose tablets (take 4 tablets). A few pieces of hard candy. Check food labels to see how many pieces to eat for 15 grams. 4 oz (120 mL) of fruit juice. 4 oz (120 mL) of regular soda (not diet soda). 1 Tbsp (15 mL) of honey or sugar. 1  tube of glucose gel. Check your blood sugar 15 minutes after you take the carb. If your blood sugar is still at or below 70 mg/dL (3.9 mmol/L), take 15 grams of a carb again. If your blood sugar does not go above 70 mg/dL (3.9 mmol/L) after 3 tries, get help right away. After your blood sugar goes back to normal, eat a meal or a snack within 1 hour.  Treating very low blood sugar If your blood sugar is below 54 mg/dL (3 mmol/L), you have very low blood sugar, or severe hypoglycemia. This is an emergency. Get medical help right away. If you have very low blood sugar and you cannot eat or drink, you will need to be given a hormone called glucagon. A family member or friend should learn how to check your blood sugar and how to give you glucagon. Ask your doctor if you need to have an emergency glucagon kit at home. Very low blood sugar may also need to be treated in a hospital. Follow these instructions at home: General instructions Take over-the-counter and prescription medicines only as told by your doctor. Stay aware of your blood sugar as told by your doctor. If you drink alcohol: Limit how much you have to: 0-1 drink a day for women who are not pregnant. 0-2 drinks a day for men. Know how much alcohol is in your drink. In the [place] one drink equals one 12 oz bottle of beer (355 mL), one 5 oz glass of wine (148 mL), or one 1 oz glass of hard liquor (44 mL). Be sure to eat food when you drink alcohol. Know that your body absorbs alcohol quickly. This may lead to low blood sugar later. Be sure to keep checking your blood sugar. Keep all follow-up visits. If you have diabetes:  Always have a fast-acting carb (15 grams)  with you to treat low blood sugar. Follow your diabetes care plan as told by your doctor. Make sure you: Know the symptoms of low blood sugar. Check your blood sugar as often as told. Always check it before and after exercise. Always check your blood sugar before you drive. Take your medicines as told. Follow your meal plan. Eat on time. Do not skip meals. Share your diabetes care plan with: Your work or school. People you live with. Carry a card or wear jewelry that says you have diabetes. Where to find more information American Diabetes Association: www.diabetes.org Contact a doctor if: You have trouble keeping your blood sugar in your target range. You have low blood sugar often. Get help right away if: You still have symptoms after you eat or drink something that contains 15 grams of fast-acting carb, and you cannot get your blood sugar above 70 mg/dL by following the 15:15 rule. Your blood sugar is below 54 mg/dL (3 mmol/L). You have a seizure. You faint. These symptoms may be an emergency. Get help right away. Call your local emergency services (911 in the [place] Do not wait to see if the symptoms will go away. Do not drive yourself to the hospital. Summary Hypoglycemia happens when the level of sugar (glucose) in your blood is too low. Low blood sugar can happen to people who have diabetes and people who do not have diabetes. Low blood sugar can happen quickly, and it can be an emergency. Make sure you know the symptoms of low blood sugar and know how to treat it. Always keep a source of sugar (fast-acting carb) with you to treat  low blood sugar. This information is not intended to replace advice given to you by your health care provider. Make sure you discuss any questions you have with your health care provider. Document Revised: 05/11/2020 Document Reviewed: 05/11/2020 Elsevier Patient Education  2022 [place] [facility], BSN RN Case Manager Jonni Sanger  Family Medicine [phone]   CLINICAL CARE PLAN: Patient Care Plan: Diabetes Type 2 (Adult)     Problem Identified: Glycemic Management (Diabetes, Type 2)   Priority: Medium     Long-Range Goal: Glycemic Management Optimized   Start Date: 04/23/2021  Expected End Date: 10/21/2021  This Visit's Progress: On track  Priority: Medium  Note:   Objective:  Lab Results  Component Value Date   HGBA1C 7.7 (H) 01/29/2021   Lab Results  Component Value Date   CREATININE 1.45 (H) 01/29/2021   CREATININE 1.37 (H) 05/08/2020   CREATININE 1.33 (H) 12/14/2019   Lab Results  Component Value Date   EGFR 48 (L) 01/29/2021   Current Barriers:  Knowledge Deficits related to basic Diabetes pathophysiology and self care/management- Spoke with patient's granddaughter/ HCPOA due to patient cannot hear well, [name] reports pt has had multiple hearing tests, followed up with ENT/ audiologist and agreed upon that hearing aides will not be of much help to patient.  [name] reports pt lives alone, is independent with ADL, IADL's, continues to drive, 2 adult sons live 5 minutes away, patient continues to smoke 1 pack per day and not interested in quitting at present.  Patient does not adhere to any special diet, eats out alot for companionship and to get out of the house.  Patient checks CBG BID and granddaughter unsure of the readings. Does not adhere to provider recommendations re: eats a regular diet Case Manager Clinical Goal(s):  patient will demonstrate improved adherence to prescribed treatment plan for diabetes self care/management as evidenced by: AIC under 7 daily monitoring and recording of CBG       adherence to ADA/ carb modified diet      adherence to prescribed medication regimen        contacting provider for new or worsened symptoms or questions      Interventions:  Collaboration with Susy Frizzle, MD regarding development and update of comprehensive plan of care as evidenced by  provider attestation and co-signature Inter-disciplinary care team collaboration (see longitudinal plan of care) Provided education to patient about basic DM disease process Reviewed medications with patient and discussed importance of medication adherence Discussed plans with patient for ongoing care management follow up and provided patient with direct contact information for care management team Provided patient with written educational materials related to hypo and hyperglycemia and importance of correct treatment Reviewed scheduled/upcoming provider appointments including: verified patient has no upcoming scheduled appointments Review of patient status, including review of consultants reports, relevant laboratory and other test results, and medications completed. Reviewed carbohydrate modified diet and foods choices Self-Care Activities Patient Goals: - check blood sugar at prescribed times - check blood sugar if I feel it is too high or too low - take the blood sugar log to all doctor visits - take the blood sugar meter to all doctor visits  - look over education sent via My Chart- hypoglycemia - be mindful of carbohydrate intake- too much starchy foods at one meal will elevate blood sugar- foods such as bread, pasta, rice, potatoes. - change to whole grain breads, cereal, pasta - drink 6 to 8 glasses of water  each day - fill half of plate with vegetables - limit fast food meals to no more than 1 per week - prepare main meal at home 3 to 5 days each week - check feet daily for cuts, sores or redness - keep feet up while sitting - trim toenails straight across - wash and dry feet carefully every day - wear comfortable, cotton socks - wear comfortable, well-fitting shoes Follow Up Plan: Telephone follow up appointment with care management team member scheduled for:   06/08/2021    Patient Care Plan: COPD (Adult)     Problem Identified: Symptom Exacerbation (COPD)   Priority:  Medium     Long-Range Goal: Symptom Exacerbation Prevented or Minimized   Start Date: 04/23/2021  Expected End Date: 10/21/2021  This Visit's Progress: On track  Priority: Medium  Note:   Current Barriers:  Knowledge deficits related to basic understanding of COPD disease process- patient/ caregivers can benefit from reinforcement / management of COPD, medications. Knowledge deficits related to basic COPD self care/management- needs teaching/ reinforcement COPD action plan Does not adhere to provider recommendations re:  continues to smoke one pack per day (not interested in quitting at present) Case Manager Clinical Goal(s): patient will verbalize understanding of COPD action plan and when to seek appropriate levels of medical care patient will verbalize basic understanding of COPD disease process and self care activities  Interventions:  Collaboration with Susy Frizzle, MD regarding development and update of comprehensive plan of care as evidenced by provider attestation and co-signature Inter-disciplinary care team collaboration (see longitudinal plan of care) Provided patient with basic written and verbal COPD education on self care/management/and exacerbation prevention  Provided patient with COPD action plan and reinforced importance of daily self assessment Provided instruction about proper use of medications used for management of COPD including inhalers Advised patient to self assesses COPD action plan zone and make appointment with provider if in the yellow zone for 48 hours without improvement. Provided patient with education about the role of exercise in the management of COPD Advised patient to engage in light exercise as tolerated 3-5 days a week Provided education about and advised patient to utilize infection prevention strategies to reduce risk of respiratory infection  Self-Care Activities:  Attends all scheduled provider appointments Performs ADL's  independently Performs IADL's independently Calls provider office for new concerns or questions Patient Goals: - develop a rescue plan/ know how you feel each day, call doctor early on for change in health status, symptoms - eliminate symptom triggers at home, consider stopping or cutting down smoking cigarettes - follow rescue plan if symptoms flare-up - keep follow-up appointments  - use oxygen as prescribed, do not smoke around oxygen - practice good handwashing and wear a mask as needed - do breathing exercises every day - eat healthy - get at least 7 to 8 hours of sleep at night - get outdoors every day (weather permitting) - keep room cool and dark - avoid second hand smoke - eliminate smoking in my home Follow Up Plan: Telephone follow up appointment with care management team member scheduled for:   06/08/2021

## 2021-04-23 NOTE — Chronic Care Management (AMB) (Signed)
Chronic Care Management   CCM RN Visit Note  04/23/2021 Name: [name] MRN: [id number] DOB: 09-17-1934  Subjective: [name] is a 86 y.o. year old male who is a primary care patient of Pickard, Cammie Mcgee, MD. The care management team was consulted for assistance with disease management and care coordination needs.    Engaged with patient by telephone for initial visit in response to provider referral for case management and/or care coordination services.   Consent to Services:  The patient was given the following information about Chronic Care Management services today, agreed to services, and gave verbal consent: 1. CCM service includes personalized support from designated clinical staff supervised by the primary care provider, including individualized plan of care and coordination with other care providers 2. 24/7 contact phone numbers for assistance for urgent and routine care needs. 3. Service will only be billed when office clinical staff spend 20 minutes or more in a month to coordinate care. 4. Only one practitioner may furnish and bill the service in a calendar month. 5.The patient may stop CCM services at any time (effective at the end of the month) by phone call to the office staff. 6. The patient will be responsible for cost sharing (co-pay) of up to 20% of the service fee (after annual deductible is met). Patient agreed to services and consent obtained.  Patient agreed to services and verbal consent obtained.   Assessment: Review of patient past medical history, allergies, medications, health status, including review of consultants reports, laboratory and other test data, was performed as part of comprehensive evaluation and provision of chronic care management services.   SDOH (Social Determinants of Health) assessments and interventions performed:  SDOH Interventions    Flowsheet Row Most Recent Value  SDOH Interventions   Food Insecurity Interventions  Intervention Not Indicated  Transportation Interventions Intervention Not Indicated        CCM Care Plan  Allergies  Allergen Reactions   Celebrex [Celecoxib] Rash and Other (See Comments)    Blister    Outpatient Encounter Medications as of 04/23/2021  Medication Sig   aspirin 325 MG tablet Take 325 mg by mouth daily.   blood glucose meter kit and supplies Dispense based on patient and insurance preference. Use up to four times daily as directed. (FOR ICD-10 E10.9, E11.9).   clobetasol cream (TEMOVATE) 0.05 % Please specify directions, refills and quantity   clotrimazole (LOTRIMIN) 1 % cream Apply 1 application topically 2 (two) times daily. Apply to the head of the penis   glucose blood (ONETOUCH ULTRA) test strip USE TO CHECK BLOOD SUGAR 4 TO 6 TIMES DAILY. E11.9   HYDROcodone-acetaminophen (NORCO) 5-325 MG tablet Take 1 tablet by mouth every 6 (six) hours as needed for moderate pain.   hydrOXYzine (ATARAX/VISTARIL) 25 MG tablet TAKE 1 TABLET BY MOUTH 3 TIMES DAILY AS NEEDED FOR ITCHING.   Insulin Syringe-Needle U-100 (BD INSULIN SYRINGE U/F) 31G X 5/16" 0.3 ML MISC USE WITH NOVOLIN TWICE DAY WITH A MEAL   nitroGLYCERIN (NITROSTAT) 0.4 MG SL tablet Place 0.4 mg under the tongue every 5 (five) minutes as needed for chest pain.   NOVOLIN 70/30 (70-30) 100 UNIT/ML injection INJECT 8 UNITS SQ IN THE MORNING AND INJECT 12 UNITS SQ AT BEDTIME   OneTouch Delica Lancets 16W MISC USE TO CHECK BLOOD SUAGR 3 TIMES A DAY DX:E11.9   pantoprazole (PROTONIX) 40 MG tablet TAKE 1 TABLET BY MOUTH EVERY DAY   pravastatin (PRAVACHOL) 40 MG tablet Take  1 tablet (40 mg total) by mouth daily.   tamsulosin (FLOMAX) 0.4 MG CAPS capsule Take 1 capsule (0.4 mg total) by mouth daily.   triamcinolone cream (KENALOG) 0.1 % APPLY TO AFFECTED AREA TWICE A DAY   No facility-administered encounter medications on file as of 04/23/2021.    Patient Active Problem List   Diagnosis Date Noted   Nocturnal hypoxemia  09/05/2020   COPD GOLD 0 still smoking  06/07/2018   Pseudomonas pneumonia ([place]) 02/02/2018   Adult BMI <19 kg/sq m 02/02/2018   Pulmonary infiltrates on CXR 01/08/2018   Depression 11/25/2017   DKA (diabetic ketoacidoses) 07/29/2017   Lung nodule 01/13/2015   Food impaction of esophagus 12/06/2013   Dysphagia 12/06/2013   Cerebrovascular disease 11/14/2011   Fatigue 01/08/2011   Bruit 01/08/2011   Cigarette smoker 01/11/2010   ABDOMINAL BRUIT 01/11/2010   DYSLIPIDEMIA 11/19/2008   Essential hypertension 11/19/2008   Atrial flutter ([place]) 11/19/2008   DVT 11/19/2008    Conditions to be addressed/monitored:COPD and DMII  Care Plan : Diabetes Type 2 (Adult)  Updates made by Kassie Mends, RN since 04/23/2021 12:00 AM     Problem: Glycemic Management (Diabetes, Type 2)   Priority: Medium     Long-Range Goal: Glycemic Management Optimized   Start Date: 04/23/2021  Expected End Date: 10/21/2021  This Visit's Progress: On track  Priority: Medium  Note:   Objective:  Lab Results  Component Value Date   HGBA1C 7.7 (H) 01/29/2021   Lab Results  Component Value Date   CREATININE 1.45 (H) 01/29/2021   CREATININE 1.37 (H) 05/08/2020   CREATININE 1.33 (H) 12/14/2019   Lab Results  Component Value Date   EGFR 48 (L) 01/29/2021   Current Barriers:  Knowledge Deficits related to basic Diabetes pathophysiology and self care/management- Spoke with patient's granddaughter/ HCPOA due to patient cannot hear well, [name] reports pt has had multiple hearing tests, followed up with ENT/ audiologist and agreed upon that hearing aides will not be of much help to patient.  [name] reports pt lives alone, is independent with ADL, IADL's, continues to drive, 2 adult sons live 5 minutes away, patient continues to smoke 1 pack per day and not interested in quitting at present.  Patient does not adhere to any special diet, eats out alot for companionship and to get out of the house.  Patient  checks CBG BID and granddaughter unsure of the readings. Does not adhere to provider recommendations re: eats a regular diet Case Manager Clinical Goal(s):  patient will demonstrate improved adherence to prescribed treatment plan for diabetes self care/management as evidenced by: AIC under 7 daily monitoring and recording of CBG       adherence to ADA/ carb modified diet      adherence to prescribed medication regimen        contacting provider for new or worsened symptoms or questions      Interventions:  Collaboration with Susy Frizzle, MD regarding development and update of comprehensive plan of care as evidenced by provider attestation and co-signature Inter-disciplinary care team collaboration (see longitudinal plan of care) Provided education to patient about basic DM disease process Reviewed medications with patient and discussed importance of medication adherence Discussed plans with patient for ongoing care management follow up and provided patient with direct contact information for care management team Provided patient with written educational materials related to hypo and hyperglycemia and importance of correct treatment Reviewed scheduled/upcoming provider appointments including: verified patient has no upcoming  scheduled appointments Review of patient status, including review of consultants reports, relevant laboratory and other test results, and medications completed. Reviewed carbohydrate modified diet and foods choices Self-Care Activities Patient Goals: - check blood sugar at prescribed times - check blood sugar if I feel it is too high or too low - take the blood sugar log to all doctor visits - take the blood sugar meter to all doctor visits  - look over education sent via My Chart- hypoglycemia - be mindful of carbohydrate intake- too much starchy foods at one meal will elevate blood sugar- foods such as bread, pasta, rice, potatoes. - change to whole grain breads,  cereal, pasta - drink 6 to 8 glasses of water each day - fill half of plate with vegetables - limit fast food meals to no more than 1 per week - prepare main meal at home 3 to 5 days each week - check feet daily for cuts, sores or redness - keep feet up while sitting - trim toenails straight across - wash and dry feet carefully every day - wear comfortable, cotton socks - wear comfortable, well-fitting shoes Follow Up Plan: Telephone follow up appointment with care management team member scheduled for:   06/08/2021    Care Plan : COPD (Adult)  Updates made by Kassie Mends, RN since 04/23/2021 12:00 AM     Problem: Symptom Exacerbation (COPD)   Priority: Medium     Long-Range Goal: Symptom Exacerbation Prevented or Minimized   Start Date: 04/23/2021  Expected End Date: 10/21/2021  This Visit's Progress: On track  Priority: Medium  Note:   Current Barriers:  Knowledge deficits related to basic understanding of COPD disease process- patient/ caregivers can benefit from reinforcement / management of COPD, medications. Knowledge deficits related to basic COPD self care/management- needs teaching/ reinforcement COPD action plan Does not adhere to provider recommendations re:  continues to smoke one pack per day (not interested in quitting at present) Case Manager Clinical Goal(s): patient will verbalize understanding of COPD action plan and when to seek appropriate levels of medical care patient will verbalize basic understanding of COPD disease process and self care activities  Interventions:  Collaboration with Susy Frizzle, MD regarding development and update of comprehensive plan of care as evidenced by provider attestation and co-signature Inter-disciplinary care team collaboration (see longitudinal plan of care) Provided patient with basic written and verbal COPD education on self care/management/and exacerbation prevention  Provided patient with COPD action plan and  reinforced importance of daily self assessment Provided instruction about proper use of medications used for management of COPD including inhalers Advised patient to self assesses COPD action plan zone and make appointment with provider if in the yellow zone for 48 hours without improvement. Provided patient with education about the role of exercise in the management of COPD Advised patient to engage in light exercise as tolerated 3-5 days a week Provided education about and advised patient to utilize infection prevention strategies to reduce risk of respiratory infection  Self-Care Activities:  Attends all scheduled provider appointments Performs ADL's independently Performs IADL's independently Calls provider office for new concerns or questions Patient Goals: - develop a rescue plan/ know how you feel each day, call doctor early on for change in health status, symptoms - eliminate symptom triggers at home, consider stopping or cutting down smoking cigarettes - follow rescue plan if symptoms flare-up - keep follow-up appointments  - use oxygen as prescribed, do not smoke around oxygen - practice good handwashing  and wear a mask as needed - do breathing exercises every day - eat healthy - get at least 7 to 8 hours of sleep at night - get outdoors every day (weather permitting) - keep room cool and dark - avoid second hand smoke - eliminate smoking in my home Follow Up Plan: Telephone follow up appointment with care management team member scheduled for:   06/08/2021     Plan:Telephone follow up appointment with care management team member scheduled for: 06/08/2021  Jacqlyn Larsen [facility], BSN RN Case Manager [facility] [phone]

## 2021-04-27 DIAGNOSIS — J449 Chronic obstructive pulmonary disease, unspecified: Secondary | ICD-10-CM | POA: Diagnosis not present

## 2021-05-21 DIAGNOSIS — Z0100 Encounter for examination of eyes and vision without abnormal findings: Secondary | ICD-10-CM | POA: Diagnosis not present

## 2021-05-22 ENCOUNTER — Other Ambulatory Visit: Payer: Self-pay | Admitting: Family Medicine

## 2021-05-27 DIAGNOSIS — J449 Chronic obstructive pulmonary disease, unspecified: Secondary | ICD-10-CM | POA: Diagnosis not present

## 2021-06-08 ENCOUNTER — Ambulatory Visit (INDEPENDENT_AMBULATORY_CARE_PROVIDER_SITE_OTHER): Payer: Medicare HMO | Admitting: *Deleted

## 2021-06-08 DIAGNOSIS — E1140 Type 2 diabetes mellitus with diabetic neuropathy, unspecified: Secondary | ICD-10-CM

## 2021-06-08 DIAGNOSIS — J449 Chronic obstructive pulmonary disease, unspecified: Secondary | ICD-10-CM

## 2021-06-08 NOTE — Chronic Care Management (AMB) (Signed)
Chronic Care Management   CCM RN Visit Note  06/08/2021 Name: [name] MRN: [id number] DOB: Apr 23, 1935  Subjective: [name] is a 86 y.o. year old male who is a primary care patient of Pickard, Cammie Mcgee, MD. The care management team was consulted for assistance with disease management and care coordination needs.    Engaged with patient by telephone for follow up visit in response to provider referral for case management and/or care coordination services.   Consent to Services:  The patient was given information about Chronic Care Management services, agreed to services, and gave verbal consent prior to initiation of services.  Please see initial visit note for detailed documentation.   Patient agreed to services and verbal consent obtained.   Assessment: Review of patient past medical history, allergies, medications, health status, including review of consultants reports, laboratory and other test data, was performed as part of comprehensive evaluation and provision of chronic care management services.   SDOH (Social Determinants of Health) assessments and interventions performed:    CCM Care Plan  Allergies  Allergen Reactions   Celebrex [Celecoxib] Rash and Other (See Comments)    Blister    Outpatient Encounter Medications as of 06/08/2021  Medication Sig   aspirin 325 MG tablet Take 325 mg by mouth daily.   blood glucose meter kit and supplies Dispense based on patient and insurance preference. Use up to four times daily as directed. (FOR ICD-10 E10.9, E11.9).   clobetasol cream (TEMOVATE) 0.05 % Please specify directions, refills and quantity   clotrimazole (LOTRIMIN) 1 % cream Apply 1 application topically 2 (two) times daily. Apply to the head of the penis   glucose blood (ONETOUCH ULTRA) test strip USE TO CHECK BLOOD SUGAR 4 TO 6 TIMES DAILY. E11.9   HYDROcodone-acetaminophen (NORCO) 5-325 MG tablet Take 1 tablet by mouth every 6 (six) hours as needed for  moderate pain.   hydrOXYzine (ATARAX/VISTARIL) 25 MG tablet TAKE 1 TABLET BY MOUTH 3 TIMES DAILY AS NEEDED FOR ITCHING.   Insulin Syringe-Needle U-100 (BD INSULIN SYRINGE U/F) 31G X 5/16" 0.3 ML MISC USE WITH NOVOLIN TWICE DAY WITH A MEAL   nitroGLYCERIN (NITROSTAT) 0.4 MG SL tablet Place 0.4 mg under the tongue every 5 (five) minutes as needed for chest pain.   NOVOLIN 70/30 (70-30) 100 UNIT/ML injection INJECT 8 UNITS SQ IN THE MORNING AND INJECT 12 UNITS SQ AT BEDTIME   OneTouch Delica Lancets 22Q MISC USE TO CHECK BLOOD SUAGR 3 TIMES A DAY DX:E11.9   pantoprazole (PROTONIX) 40 MG tablet TAKE 1 TABLET BY MOUTH EVERY DAY   pravastatin (PRAVACHOL) 40 MG tablet Take 1 tablet (40 mg total) by mouth daily.   tamsulosin (FLOMAX) 0.4 MG CAPS capsule Take 1 capsule (0.4 mg total) by mouth daily.   triamcinolone cream (KENALOG) 0.1 % APPLY TO AFFECTED AREA TWICE A DAY   No facility-administered encounter medications on file as of 06/08/2021.    Patient Active Problem List   Diagnosis Date Noted   Nocturnal hypoxemia 09/05/2020   COPD GOLD 0 still smoking  06/07/2018   Pseudomonas pneumonia ([place]) 02/02/2018   Adult BMI <19 kg/sq m 02/02/2018   Pulmonary infiltrates on CXR 01/08/2018   Depression 11/25/2017   DKA (diabetic ketoacidosis) ([place]) 07/29/2017   Lung nodule 01/13/2015   Food impaction of esophagus 12/06/2013   Dysphagia 12/06/2013   Cerebrovascular disease 11/14/2011   Fatigue 01/08/2011   Bruit 01/08/2011   Cigarette smoker 01/11/2010   ABDOMINAL BRUIT 01/11/2010  DYSLIPIDEMIA 11/19/2008   Essential hypertension 11/19/2008   Atrial flutter ([place]) 11/19/2008   DVT 11/19/2008    Conditions to be addressed/monitored:COPD and DMII  Care Plan : RN Care Manager Plan of Care  Updates made by Kassie Mends, RN since 06/08/2021 12:00 AM     Problem: No plan of care established for management of chronic disease states  (COPD, DM2)   Priority: High     Long-Range Goal:  Development of plan of care for chronic disease management (COPD, DM2)   Start Date: 06/08/2021  Expected End Date: 12/05/2021  Priority: High  Note:   Current Barriers:  Knowledge Deficits related to plan of care for management of COPD and DMII  Knowledge Deficits related to basic Diabetes pathophysiology and self care/management- Spoke with patient's granddaughter/ HCPOA due to patient cannot hear well, [name] reports pt has had multiple hearing tests, followed up with ENT/ audiologist and agreed upon that hearing aides will not be of much help to patient.  [name] reports pt lives alone, is independent with ADL, IADL's, continues to drive, 2 adult sons live 5 minutes away, patient continues to smoke 1 pack per day and not interested in quitting at present.  Patient does not adhere to any special diet, eats out alot for companionship and to get out of the house.  Patient checks CBG BID and granddaughter unsure of the readings. Does not adhere to provider recommendations re: eats a regular diet Knowledge deficits related to basic understanding of COPD disease process- patient/ caregivers can benefit from reinforcement / management of COPD, medications. Knowledge deficits related to basic COPD self care/management- needs teaching/ reinforcement COPD action plan  RNCM Clinical Goal(s):  Patient will verbalize understanding of plan for management of COPD and DMII as evidenced by patient report, review of EHR and  through collaboration with RN Care manager, provider, and care team.   Interventions: 1:1 collaboration with primary care provider regarding development and update of comprehensive plan of care as evidenced by provider attestation and co-signature Inter-disciplinary care team collaboration (see longitudinal plan of care) Evaluation of current treatment plan related to  self management and patient's adherence to plan as established by provider   COPD Interventions:  (Status:  Goal on track:   Yes.) Long Term Goal Advised patient to track and manage COPD triggers Advised patient to self assesses COPD action plan zone and make appointment with provider if in the yellow zone for 48 hours without improvement Provided education about and advised patient to utilize infection prevention strategies to reduce risk of respiratory infection Encouraged smoking cessation Reviewed medications and importance of taking as prescribed   Diabetes Interventions:  (Status:  Goal on track:  Yes.) Long Term Goal Assessed patient's understanding of A1c goal: <7% Reviewed medications with patient and discussed importance of medication adherence Counseled on importance of regular laboratory monitoring as prescribed Discussed plans with patient for ongoing care management follow up and provided patient with direct contact information for care management team Review of patient status, including review of consultants reports, relevant laboratory and other test results, and medications completed Reinforced carbohydrate modified diet Lab Results  Component Value Date   HGBA1C 7.7 (H) 01/29/2021    Patient Goals/Self-Care Activities: Take medications as prescribed   Attend all scheduled provider appointments Call provider office for new concerns or questions  check blood sugar at prescribed times: twice daily check feet daily for cuts, sores or redness enter blood sugar readings and medication or insulin into daily log  take the blood sugar log to all doctor visits take the blood sugar meter to all doctor visits fill half of plate with vegetables - eliminate smoking in my home - limit outdoor activity during cold weather - follow rescue plan if symptoms flare-up - get at least 7 to 8 hours of sleep at night Practice good handwashing, wear a mask as needed Follow carbohydrate modified diet Avoid concentrated sweets  Follow Up Plan:  Telephone follow up appointment with care management team member  scheduled for:  07/23/2021      Plan:Telephone follow up appointment with care management team member scheduled for:  07/23/2021  Jacqlyn Larsen [facility], BSN RN Case Manager [facility] [phone]

## 2021-06-08 NOTE — Patient Instructions (Signed)
Visit Information  Thank you for taking time to visit with me today. Please don't hesitate to contact me if I can be of assistance to you before our next scheduled telephone appointment.  Following are the goals we discussed today:  Take medications as prescribed   Attend all scheduled provider appointments Call provider office for new concerns or questions  check blood sugar at prescribed times: twice daily check feet daily for cuts, sores or redness enter blood sugar readings and medication or insulin into daily log take the blood sugar log to all doctor visits take the blood sugar meter to all doctor visits fill half of plate with vegetables - eliminate smoking in my home - limit outdoor activity during cold weather - follow rescue plan if symptoms flare-up - get at least 7 to 8 hours of sleep at night Practice good handwashing, wear a mask as needed Follow carbohydrate modified diet Avoid concentrated sweets  Our next appointment is by telephone on 07/23/21 at 1045 am  Please call the care guide team at [phone] if you need to cancel or reschedule your appointment.   If you are experiencing a Mental Health or [place] or need someone to talk to, please call the [place] National Suicide Prevention Lifeline: 518-709-3645 or TTY: (458)165-1432 TTY 561-320-0131) to talk to a trained counselor call 1-800-273-TALK (toll free, 24 hour hotline) go to [facility] [address], [place] [phone]) call 911   Patient verbalizes understanding of instructions provided today and agrees to view in [place].   Jacqlyn Larsen RNC, BSN RN Case Manager [facility] [phone]

## 2021-06-13 ENCOUNTER — Telehealth: Payer: Self-pay | Admitting: Family Medicine

## 2021-06-13 NOTE — Telephone Encounter (Signed)
Patient's granddaughter, [name], will call back and schedule Medicare Annual Wellness Visit (AWV) in office.   If not able to come in office, please offer to do virtually or by telephone.  Left office number and my jabber [phone].  Last AWV:05/08/2020  Please schedule at anytime with Nurse Health Advisor.

## 2021-06-23 DIAGNOSIS — E1140 Type 2 diabetes mellitus with diabetic neuropathy, unspecified: Secondary | ICD-10-CM | POA: Diagnosis not present

## 2021-06-23 DIAGNOSIS — Z794 Long term (current) use of insulin: Secondary | ICD-10-CM

## 2021-06-23 DIAGNOSIS — J449 Chronic obstructive pulmonary disease, unspecified: Secondary | ICD-10-CM | POA: Diagnosis not present

## 2021-06-27 DIAGNOSIS — J449 Chronic obstructive pulmonary disease, unspecified: Secondary | ICD-10-CM | POA: Diagnosis not present

## 2021-07-03 ENCOUNTER — Telehealth: Payer: Self-pay

## 2021-07-03 NOTE — Telephone Encounter (Signed)
° °  Pre-operative Risk Assessment    Patient Name: [name]  DOB: 1935/04/13 MRN: [id number]     Request for Surgical Clearance    Procedure:  Dental Extraction - Amount of Teeth to be Pulled:  1  Date of Surgery:  Clearance TBD                                 Surgeon:  Tiney Rouge Surgeon's Group or Practice Name:   Phone number:  [phone] Fax number:  [phone]   Type of Clearance Requested:  Both - Pharmacy:  Hold Aspirin     Type of Anesthesia:  Not Indicated   Additional requests/questions:  Please advise surgeon/provider what medications should be held. Please fax a copy of clearance to the surgeon's office.  [name]   07/03/2021, 12:48 PM

## 2021-07-03 NOTE — Telephone Encounter (Signed)
° °  Primary Cardiologist: Kirk Ruths, MD  Chart reviewed as part of pre-operative protocol coverage. Simple dental extractions are considered low risk procedures per guidelines and generally do not require any specific cardiac clearance. It is also generally accepted that for simple extractions and dental cleanings, there is no need to interrupt blood thinner therapy.   SBE prophylaxis is not required for the patient.  I will route this recommendation to the requesting party via Epic fax function and remove from pre-op pool.  Please call with questions.  Deberah Pelton, NP 07/03/2021, 1:15 PM

## 2021-07-09 ENCOUNTER — Other Ambulatory Visit: Payer: Self-pay

## 2021-07-09 ENCOUNTER — Ambulatory Visit (INDEPENDENT_AMBULATORY_CARE_PROVIDER_SITE_OTHER): Payer: Medicare HMO | Admitting: Family Medicine

## 2021-07-09 VITALS — BP 108/52 | HR 84 | Temp 98.0°F | Resp 18 | Ht 74.0 in | Wt 137.0 lb

## 2021-07-09 DIAGNOSIS — R051 Acute cough: Secondary | ICD-10-CM | POA: Diagnosis not present

## 2021-07-09 LAB — INFLUENZA A AND B AG, IMMUNOASSAY
INFLUENZA A ANTIGEN: NOT DETECTED
INFLUENZA B ANTIGEN: NOT DETECTED

## 2021-07-09 MED ORDER — AMOXICILLIN-POT CLAVULANATE 875-125 MG PO TABS: 1.0000 | ORAL_TABLET | Freq: Two times a day (BID) | ORAL | 0 refills | Status: DC

## 2021-07-09 NOTE — Progress Notes (Signed)
Subjective:    Patient ID: [name], male    DOB: [birth year], 87 y.o.   MRN: [id number]  HPI   Patient is a very pleasant 87 year old Caucasian gentleman with a history of a cavitary nodule in his right upper lobe who presents with a high fever and cough 48 hours.  Symptoms began yesterday.  Fever was as high as 103 at home.  He denies any chest pain but he does report a cough productive of yellow mucus.  He denies any pleurisy or hemoptysis or shortness of breath.  He does report diffuse body aches myalgias fever chills.  COVID test this morning at home was negative.  Flu test was performed here today and was negative.  He denies any otalgia.  He denies any sore throat.  He denies any significant rhinorrhea or sinus pain.  He denies any nausea vomiting or diarrhea.  He denies any dysuria.  There is no known exposure to COVID or the flu. Past Medical History:  Diagnosis Date   Acute venous embolism and thrombosis of unspecified deep vessels of lower extremity    Atrial flutter (HCC)    and SVT that is post ablation   CAD (coronary artery disease)    Chronic kidney disease    stones   Coronary atherosclerosis 11/19/2008   Qualifier: Diagnosis of  By: Stanford Breed, MD, Kandyce Rud    Dyslipidemia    GERD (gastroesophageal reflux disease)    HTN (hypertension)    Shortness of breath    on exertion   Past Surgical History:  Procedure Laterality Date   CARDIAC CATHETERIZATION     s/p cardiac catheterization and bypass surgery as well as EGD with dilation x3, left lower extremity vein stripping   CORONARY ARTERY BYPASS GRAFT     ESOPHAGOGASTRODUODENOSCOPY N/A 12/06/2013   Procedure: ESOPHAGOGASTRODUODENOSCOPY (EGD);  Surgeon: Lear Ng, MD;  Location: [facility];  Service: Endoscopy;  Laterality: N/A;   left knee anthroscopy     SECONDARY CLOSURE ARM     VIDEO BRONCHOSCOPY Bilateral 01/14/2018   Procedure: VIDEO BRONCHOSCOPY WITHOUT FLUORO;  Surgeon: Tanda Rockers, MD;  Location: [facility];  Service: Cardiopulmonary;  Laterality: Bilateral;   Current Outpatient Medications on File Prior to Visit  Medication Sig Dispense Refill   aspirin 325 MG tablet Take 325 mg by mouth daily.     blood glucose meter kit and supplies Dispense based on patient and insurance preference. Use up to four times daily as directed. (FOR ICD-10 E10.9, E11.9). 1 each 0   clobetasol cream (TEMOVATE) 0.05 % Please specify directions, refills and quantity 1 g 0   clotrimazole (LOTRIMIN) 1 % cream Apply 1 application topically 2 (two) times daily. Apply to the head of the penis 30 g 3   glucose blood (ONETOUCH ULTRA) test strip USE TO CHECK BLOOD SUGAR 4 TO 6 TIMES DAILY. E11.9 100 strip 9   HYDROcodone-acetaminophen (NORCO) 5-325 MG tablet Take 1 tablet by mouth every 6 (six) hours as needed for moderate pain. 30 tablet 0   hydrOXYzine (ATARAX/VISTARIL) 25 MG tablet TAKE 1 TABLET BY MOUTH 3 TIMES DAILY AS NEEDED FOR ITCHING. 30 tablet 3   Insulin Syringe-Needle U-100 (BD INSULIN SYRINGE U/F) 31G X 5/16" 0.3 ML MISC USE WITH NOVOLIN TWICE DAY WITH A MEAL 100 each 5   nitroGLYCERIN (NITROSTAT) 0.4 MG SL tablet Place 0.4 mg under the tongue every 5 (five) minutes as needed for chest pain.     NOVOLIN 70/30 (  70-30) 100 UNIT/ML injection INJECT 8 UNITS SQ IN THE MORNING AND INJECT 12 UNITS SQ AT BEDTIME 10 mL 11   OneTouch Delica Lancets 59R MISC USE TO CHECK BLOOD SUAGR 3 TIMES A DAY DX:E11.9 100 each 5   pantoprazole (PROTONIX) 40 MG tablet TAKE 1 TABLET BY MOUTH EVERY DAY 90 tablet 3   pravastatin (PRAVACHOL) 40 MG tablet Take 1 tablet (40 mg total) by mouth daily. 90 tablet 1   tamsulosin (FLOMAX) 0.4 MG CAPS capsule Take 1 capsule (0.4 mg total) by mouth daily. 90 capsule 1   triamcinolone cream (KENALOG) 0.1 % APPLY TO AFFECTED AREA TWICE A DAY 454 g 3   No current facility-administered medications on file prior to visit.   Allergies  Allergen Reactions   Celebrex  [Celecoxib] Rash and Other (See Comments)    Blister   Social History   Socioeconomic History   Marital status: Widowed    Spouse name: Not on file   Number of children: Not on file   Years of education: Not on file   Highest education level: Not on file  Occupational History   Not on file  Tobacco Use   Smoking status: Every Day    Packs/day: 1.00    Years: 60.00    Pack years: 60.00    Types: Cigarettes   Smokeless tobacco: Never  Substance and Sexual Activity   Alcohol use: No   Drug use: No   Sexual activity: Not on file  Other Topics Concern   Not on file  Social History Narrative   Lives in [place] with his wife and 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    Social Determinants of Health   Financial Resource Strain: Not on file  Food Insecurity: No Food Insecurity   Worried About Charity fundraiser in the Last Year: Never true   Ran Out of Food in the Last Year: Never true  Transportation Needs: Unknown   Lack of Transportation (Medical): No   Lack of Transportation (Non-Medical): Not on file  Physical Activity: Not on file  Stress: Not on file  Social Connections: Not on file  Intimate Partner Violence: Not on file      Review of Systems     Objective:   Physical Exam Constitutional:      General: He is not in acute distress.    Appearance: Normal appearance. He is normal weight. He is ill-appearing. He is not toxic-appearing.  HENT:     Right Ear: Tympanic membrane and ear canal normal.     Left Ear: Tympanic membrane and ear canal normal.  Cardiovascular:     Rate and Rhythm: Normal rate and regular rhythm.     Heart sounds: Normal heart sounds. No murmur heard.   No friction rub. No gallop.  Pulmonary:     Effort: Pulmonary effort is normal. No tachypnea, accessory muscle usage or respiratory distress.     Breath sounds: Normal breath sounds. Decreased air movement present. No  wheezing, rhonchi or rales.  Abdominal:     General: Abdomen is flat. Bowel sounds are normal. There is no distension.     Palpations: Abdomen is soft.     Tenderness: There is no abdominal tenderness. There is no guarding.  Neurological:     Mental Status: He is alert.  Bilateral carotid bruit       Assessment & Plan:  Acute cough - Plan: Influenza A  and B Ag, Immunoassay, DG Chest 2 View COVID test at home is negative.  Flu test here today is negative.  However his fever his dramatic 103.  He appears ill.  He has a history of pneumonia.  I will treat the patient aggressively with Augmentin 875 mg twice daily for 10 days for possible streptococcal pneumonia.  We will send the patient for chest x-ray for further evaluation.  Recommended repeating COVID test in 48 hours to confirm that she truly negative.  If positive, I will switch the patient to molnupiravir.

## 2021-07-10 ENCOUNTER — Other Ambulatory Visit: Payer: Self-pay | Admitting: Family Medicine

## 2021-07-10 ENCOUNTER — Ambulatory Visit
Admission: RE | Admit: 2021-07-10 | Discharge: 2021-07-10 | Disposition: A | Discharge status: Home / Self Care | Payer: Medicare HMO | Source: Ambulatory Visit | Attending: Family Medicine | Admitting: Family Medicine

## 2021-07-10 DIAGNOSIS — R051 Acute cough: Secondary | ICD-10-CM

## 2021-07-10 DIAGNOSIS — R059 Cough, unspecified: Secondary | ICD-10-CM | POA: Diagnosis not present

## 2021-07-10 MED ORDER — AZITHROMYCIN 250 MG PO TABS: ORAL_TABLET | ORAL | 0 refills | Status: DC

## 2021-07-10 MED ORDER — ALBUTEROL SULFATE (2.5 MG/3ML) 0.083% IN NEBU: 2.5000 mg | INHALATION_SOLUTION | Freq: Four times a day (QID) | RESPIRATORY_TRACT | 12 refills | Status: DC | PRN

## 2021-07-23 ENCOUNTER — Ambulatory Visit (INDEPENDENT_AMBULATORY_CARE_PROVIDER_SITE_OTHER): Payer: Medicare HMO | Admitting: *Deleted

## 2021-07-23 ENCOUNTER — Telehealth: Payer: Self-pay | Admitting: *Deleted

## 2021-07-23 DIAGNOSIS — Z794 Long term (current) use of insulin: Secondary | ICD-10-CM

## 2021-07-23 DIAGNOSIS — J449 Chronic obstructive pulmonary disease, unspecified: Secondary | ICD-10-CM

## 2021-07-23 DIAGNOSIS — E1140 Type 2 diabetes mellitus with diabetic neuropathy, unspecified: Secondary | ICD-10-CM

## 2021-07-23 NOTE — Chronic Care Management (AMB) (Signed)
Chronic Care Management   CCM RN Visit Note  07/23/2021 Name: [name] MRN: [id number] DOB: 1934-10-02  Subjective: [name] is a 87 y.o. year old male who is a primary care patient of Pickard, Cammie Mcgee, MD. The care management team was consulted for assistance with disease management and care coordination needs.    Engaged with patient by telephone for follow up visit in response to provider referral for case management and/or care coordination services.   Consent to Services:  The patient was given information about Chronic Care Management services, agreed to services, and gave verbal consent prior to initiation of services.  Please see initial visit note for detailed documentation.   Patient agreed to services and verbal consent obtained.   Assessment: Review of patient past medical history, allergies, medications, health status, including review of consultants reports, laboratory and other test data, was performed as part of comprehensive evaluation and provision of chronic care management services.   SDOH (Social Determinants of Health) assessments and interventions performed:    CCM Care Plan  Allergies  Allergen Reactions   Celebrex [Celecoxib] Rash and Other (See Comments)    Blister    Outpatient Encounter Medications as of 07/23/2021  Medication Sig   albuterol (PROVENTIL) (2.5 MG/3ML) 0.083% nebulizer solution Take 3 mLs (2.5 mg total) by nebulization every 6 (six) hours as needed for wheezing or shortness of breath.   aspirin 325 MG tablet Take 325 mg by mouth daily.   blood glucose meter kit and supplies Dispense based on patient and insurance preference. Use up to four times daily as directed. (FOR ICD-10 E10.9, E11.9).   clobetasol cream (TEMOVATE) 0.05 % Please specify directions, refills and quantity   clotrimazole (LOTRIMIN) 1 % cream Apply 1 application topically 2 (two) times daily. Apply to the head of the penis   glucose blood (ONETOUCH ULTRA)  test strip USE TO CHECK BLOOD SUGAR 4 TO 6 TIMES DAILY. E11.9   HYDROcodone-acetaminophen (NORCO) 5-325 MG tablet Take 1 tablet by mouth every 6 (six) hours as needed for moderate pain.   hydrOXYzine (ATARAX/VISTARIL) 25 MG tablet TAKE 1 TABLET BY MOUTH 3 TIMES DAILY AS NEEDED FOR ITCHING.   Insulin Syringe-Needle U-100 (BD INSULIN SYRINGE U/F) 31G X 5/16" 0.3 ML MISC USE WITH NOVOLIN TWICE DAY WITH A MEAL   nitroGLYCERIN (NITROSTAT) 0.4 MG SL tablet Place 0.4 mg under the tongue every 5 (five) minutes as needed for chest pain.   NOVOLIN 70/30 (70-30) 100 UNIT/ML injection INJECT 8 UNITS SQ IN THE MORNING AND INJECT 12 UNITS SQ AT BEDTIME   pantoprazole (PROTONIX) 40 MG tablet TAKE 1 TABLET BY MOUTH EVERY DAY   pravastatin (PRAVACHOL) 40 MG tablet Take 1 tablet (40 mg total) by mouth daily.   tamsulosin (FLOMAX) 0.4 MG CAPS capsule Take 1 capsule (0.4 mg total) by mouth daily.   triamcinolone cream (KENALOG) 0.1 % APPLY TO AFFECTED AREA TWICE A DAY   amoxicillin-clavulanate (AUGMENTIN) 875-125 MG tablet Take 1 tablet by mouth 2 (two) times daily. (Patient not taking: Reported on 07/23/2021)   azithromycin (ZITHROMAX) 250 MG tablet 2 tabs poqday1, 1 tab poqday 2-5 (Patient not taking: Reported on 3/84/5364)   OneTouch Delica Lancets 68E MISC USE TO CHECK BLOOD SUAGR 3 TIMES A DAY DX:E11.9   No facility-administered encounter medications on file as of 07/23/2021.    Patient Active Problem List   Diagnosis Date Noted   Nocturnal hypoxemia 09/05/2020   COPD GOLD 0 still smoking  06/07/2018  Pseudomonas pneumonia ([place]) 02/02/2018   Adult BMI <19 kg/sq m 02/02/2018   Pulmonary infiltrates on CXR 01/08/2018   Depression 11/25/2017   DKA (diabetic ketoacidosis) ([place]) 07/29/2017   Lung nodule 01/13/2015   Food impaction of esophagus 12/06/2013   Dysphagia 12/06/2013   Cerebrovascular disease 11/14/2011   Fatigue 01/08/2011   Bruit 01/08/2011   Cigarette smoker 01/11/2010   ABDOMINAL BRUIT  01/11/2010   DYSLIPIDEMIA 11/19/2008   Essential hypertension 11/19/2008   Atrial flutter ([place]) 11/19/2008   DVT 11/19/2008    Conditions to be addressed/monitored:COPD and DMII  Care Plan : RN Care Manager Plan of Care  Updates made by Kassie Mends, RN since 07/23/2021 12:00 AM     Problem: No plan of care established for management of chronic disease states  (COPD, DM2)   Priority: High     Long-Range Goal: Development of plan of care for chronic disease management (COPD, DM2)   Start Date: 06/08/2021  Expected End Date: 12/05/2021  Priority: High  Note:   Current Barriers:  Knowledge Deficits related to plan of care for management of COPD and DMII  Knowledge Deficits related to basic Diabetes pathophysiology and self care/management- Unable to reach patient's granddaughter/ HCPOA (patient cannot hear well). Pt lives alone, is independent with ADL, IADL's, continues to drive, 2 adult sons live 5 minutes away, patient continues to smoke 1 pack per day and not interested in quitting at present.  Patient does not adhere to any special diet, eats out alot for companionship and to get out of the house.  Patient checks CBG BID and granddaughter unsure of the readings. RN care manager spoke with patient today and he was able to hear well enough to complete the visit, patient reports CBG today 180 with most readings 130-140's range.  Patient reports he has all medications and taking as prescribed, recently finished antibiotic for cough and states " I feel better" Does not adhere to provider recommendations re: eats a regular diet Knowledge deficits related to basic understanding of COPD disease process- patient/ caregivers can benefit from reinforcement / management of COPD, medications. Knowledge deficits related to basic COPD self care/management- needs teaching/ reinforcement COPD action plan  RNCM Clinical Goal(s):  Patient will verbalize understanding of plan for management of COPD and  DMII as evidenced by patient report, review of EHR and  through collaboration with RN Care manager, provider, and care team.   Interventions: 1:1 collaboration with primary care provider regarding development and update of comprehensive plan of care as evidenced by provider attestation and co-signature Inter-disciplinary care team collaboration (see longitudinal plan of care) Evaluation of current treatment plan related to  self management and patient's adherence to plan as established by provider   COPD Interventions:  (Status:  Goal on track:  Yes.) Long Term Goal Advised patient to self assesses COPD action plan zone and make appointment with provider if in the yellow zone for 48 hours without improvement Provided education about and advised patient to utilize infection prevention strategies to reduce risk of respiratory infection Discussed the importance of adequate rest and management of fatigue with COPD Encouraged smoking cessation Reviewed medications and importance of taking as prescribed Reviewed oxygen safety and education sent via My Chart   Diabetes Interventions:  (Status:  Goal on track:  Yes.) Long Term Goal Assessed patient's understanding of A1c goal: <7% Reviewed medications with patient and discussed importance of medication adherence Review of patient status, including review of consultants reports, relevant laboratory and other  test results, and medications completed Reviewed carbohydrate modified diet Lab Results  Component Value Date   HGBA1C 7.7 (H) 01/29/2021    Patient Goals/Self-Care Activities: Take medications as prescribed   Attend all scheduled provider appointments Call provider office for new concerns or questions  check blood sugar at prescribed times: twice daily enter blood sugar readings and medication or insulin into daily log take the blood sugar log to all doctor visits take the blood sugar meter to all doctor visits trim toenails straight  across eat fish at least once per week wash and dry feet carefully every day - eliminate smoking in my home - limit outdoor activity during cold weather - follow rescue plan if symptoms flare-up - do breathing exercises every day Practice good handwashing, wear a mask as needed- avoid sick people Follow carbohydrate modified diet- be careful of intake of rice, potatoes, bread, pasta Avoid concentrated sweets Do not ever smoke around oxygen Look over education in My Chart- oxygen safety  Follow Up Plan:  Telephone follow up appointment with care management team member scheduled for:  09/17/2021      Plan:Telephone follow up appointment with care management team member scheduled for:  09/17/21  Jacqlyn Larsen [facility], BSN RN Case Manager [facility] [phone]

## 2021-07-23 NOTE — Patient Instructions (Signed)
Visit Information  Thank you for taking time to visit with me today. Please don't hesitate to contact me if I can be of assistance to you before our next scheduled telephone appointment.  Following are the goals we discussed today:  Take medications as prescribed   Attend all scheduled provider appointments Call provider office for new concerns or questions  check blood sugar at prescribed times: twice daily enter blood sugar readings and medication or insulin into daily log take the blood sugar log to all doctor visits take the blood sugar meter to all doctor visits trim toenails straight across eat fish at least once per week wash and dry feet carefully every day eliminate smoking in my home limit outdoor activity during cold weather follow rescue plan if symptoms flare-up do breathing exercises every day Practice good handwashing, wear a mask as needed- avoid sick people Follow carbohydrate modified diet- be careful of intake of rice, potatoes, bread, pasta Avoid concentrated sweets Do not ever smoke around oxygen Look over education in My Chart- oxygen safety  Our next appointment is by telephone on 09/17/21 at 945 am  Please call the care guide team at [phone] if you need to cancel or reschedule your appointment.   If you are experiencing a Mental Health or [place] or need someone to talk to, please call the [place] National Suicide Prevention Lifeline: (406) 838-5730 or TTY: (279)149-1711 TTY 915-516-6620) to talk to a trained counselor call 1-800-273-TALK (toll free, 24 hour hotline) go to [facility] [address], [place] [phone]) call 911   Patient verbalizes understanding of instructions and care plan provided today and agrees to view in [place]. Active MyChart status confirmed with patient.    Jacqlyn Larsen RNC, BSN RN Case Manager [facility] [phone] Home Oxygen Use, Adult When a  medical condition keeps you from getting enough oxygen, your health care provider may instruct you to take extra oxygen at home. Your health care provider will let you know: When to take oxygen. How long to take oxygen. How quickly oxygen should be delivered (flow rate), in liters per minute (LPM or L/M). Home oxygen can be given through: A mask. A nasal cannula. This is a device or tube that goes in the nostrils. A transtracheal catheter. This is a small, thin tube placed in the windpipe (trachea). A breathing tube (tracheostomy tube) that is surgically placed in the windpipe. This may be used in severe cases. These devices are connected with tubing to an oxygen source, such as: A tank. Tanks hold oxygen in gas form. They must be replaced when the oxygen is used up. A liquid oxygen device. This holds oxygen in liquid form. Liquid oxygen is very cold. It must be replaced when the oxygen is used up. An oxygen concentrator machine. This filters oxygen in the room. There are two types of oxygen concentrator machines--stationary and portable. A stationary oxygen concentrator machine plugs into the main electricity supply at your home. You must have a backup cylinder of oxygen in case the power goes out. A portable oxygen concentrator machine is smaller in size and more lightweight. This machine uses battery supply and can be used outside the home. Work with your health care provider to find equipment that works best for you and your lifestyle. What are the risks? Delivery of supplemental oxygen is generally safe. However, some risks include: Fire. This can happen if the oxygen is exposed to a heat source, flame, or spark.  Injury to skin. This can happen if liquid oxygen touches your skin. Damage to the lungs or other organs. This can happen from getting too little or too much oxygen. Supplies needed: To use oxygen, you will need: A mask, nasal cannula, transtracheal catheter, or tracheostomy. An  oxygen tank, a liquid oxygen device, or an oxygen concentrator. The tape that your health care provider recommends (optional). Your health care provider may also recommend: A humidifier to warm and moisten the oxygen delivered. This will depend on how much oxygen you need and the type of home oxygen device you use. A pulse oximeter. This device measures the percentage of oxygen in your blood. How to use oxygen Your health care provider or a person from your [place] will show you how to use your oxygen device. Follow his or her instructions. The instructions may look something like this: Wash your hands with soap and water. If you use an oxygen concentrator, make sure it is plugged in. Place one end of the tube into the port on the tank, device, or machine. Place the mask over your nose and mouth. Or, place the nasal cannula and secure it with tape if instructed. If you use a tracheostomy or transtracheal catheter, connect it to the oxygen source as directed. Make sure the liter-flow setting on the machine is at the level prescribed by your health care provider. Turn on the machine or adjust the knob on the tank or device to the correct liter-flow setting. When you are done, turn off and unplug the machine, or turn the knob to OFF. How to clean and care for the oxygen supplies Nasal cannula Clean it with a warm, wet cloth daily or as needed. Wash it with a liquid soap once a week. Rinse it thoroughly once or twice a week. Air-dry it. Replace it every 2-4 weeks. If you have an infection, such as a cold or pneumonia, change the cannula when you get better. Mask Replace it every 2-4 weeks. If you have an infection, such as a cold or pneumonia, change the mask when you get better. Humidifier bottle Wash the bottle between each refill: Wash it with soap and warm water. Rinse it thoroughly. Clean it and its top with a disinfectant cleaner. Air-dry it. Make sure it is dry before  you refill it. Oxygen concentrator Clean the air filter at least twice a week according to directions from your home medical equipment and service company. Wipe down the cabinet every day. To do this: Unplug the unit. Wipe down the cabinet with a damp cloth. Dry the cabinet. Other equipment Change any extra tubing every 1-3 months. Follow instructions from your health care provider about taking care of any other equipment. Safety tips Fire safety tips  Keep your oxygen and oxygen supplies at least 6 ft (2 m) away from sources of heat, flames, and sparks at all times. Do not allow smoking near your oxygen. Put up "no smoking" signs in your home. Avoid smoking areas when in public. Do not use materials that can burn (are flammable) while you use oxygen. This includes: Petroleum jelly. Hair spray or other aerosol sprays. Rubbing alcohol. Hand sanitizer. When you go to a restaurant with portable oxygen, ask to be seated in the non-smoking section. Keep a Data processing manager close by. Let your fire department know that you have oxygen in your home. Test your home smoke detectors regularly. Traveling Secure your oxygen tank in the vehicle so that it does not move  around. Follow instructions from your medical device company about how to safely secure your tank. Make sure you have enough oxygen for the amount of time you will be away from home. If you are planning to travel by public transportation (airplane, train, bus, or boat), contact the company to find out if it allows the use of an approved portable oxygen concentrator. You may also need documents from your health care provider and medical device company before you travel. General safety tips If you use an oxygen cylinder, make sure it is in a stand or secured to an object that will not move (fixed object). If you use liquid oxygen, make sure its container is kept upright at all times. If you use an oxygen concentrator: Tell Loss adjuster, chartered  company. Make sure you are given priority service in the event that your power goes out. Avoid using extension cords if possible. Follow these instructions at home: Use oxygen only as told by your health care provider. Do not use alcohol or other drugs that make you relax (sedating drugs) unless instructed. They can slow down your breathing rate and make it hard to get in enough oxygen. Know how and when to order a refill of oxygen. Always keep a spare tank of oxygen. Plan ahead for holidays when you may not be able to get a prescription filled. Use water-based lubricants on your lips or nostrils. Do not use oil-based products like petroleum jelly. To prevent skin irritation on your cheeks or behind your ears, tuck some gauze under the tubing. Where to find more information American Lung Association: DiabeticMale.de Contact a health care provider if: You get headaches often. You have a lasting cough. You are restless or have anxiety. You develop an illness that affects your breathing. You cannot exercise at your regular level. You have a fever. You have persistent redness under your nose. Get help right away if: You are confused. You are sleepy all the time. You have blue lips or fingernails. You have difficult or irregular breathing that is getting worse. You are struggling to breathe. These symptoms may represent a serious problem that is an emergency. Do not wait to see if the symptoms will go away. Get medical help right away. Call your local emergency services (911 in the [place] Do not drive yourself to the hospital. Summary Your health care provider or a person from your [place] will show you how to use your oxygen device. Follow his or her instructions. If you use an oxygen concentrator, make sure it is plugged in. Make sure the liter-flow setting on the machine is at the level prescribed by your health care provider. Use oxygen only as told by your health care  provider. Keep your oxygen and oxygen supplies at least 6 ft (2 m) away from sources of heat, flames, and sparks at all times. This information is not intended to replace advice given to you by your health care provider. Make sure you discuss any questions you have with your health care provider. Document Revised: 08/12/2019 Document Reviewed: 06/08/2019 Elsevier Patient Education  2022 [employer].

## 2021-07-23 NOTE — Telephone Encounter (Signed)
°  Care Management   Follow Up Note   07/23/2021 Name: RODEL GLASPY MRN: [id number] DOB: 04/05/1935   Referred by: Susy Frizzle, MD Reason for referral : Chronic Care Management (COPD, DM2)   An unsuccessful telephone outreach was attempted today. The patient was referred to the case management team for assistance with care management and care coordination.   Follow Up Plan: Telephone follow up appointment with care management team member scheduled for:  RN care manager unable to reach granddaughter [facility], was able to reach patient on alternate phone number.  Jacqlyn Larsen RNC, BSN RN Case Manager [facility] [phone]

## 2021-07-24 DIAGNOSIS — J449 Chronic obstructive pulmonary disease, unspecified: Secondary | ICD-10-CM | POA: Diagnosis not present

## 2021-07-24 DIAGNOSIS — Z794 Long term (current) use of insulin: Secondary | ICD-10-CM

## 2021-07-24 DIAGNOSIS — E1140 Type 2 diabetes mellitus with diabetic neuropathy, unspecified: Secondary | ICD-10-CM | POA: Diagnosis not present

## 2021-07-27 ENCOUNTER — Encounter: Payer: Self-pay | Admitting: Family Medicine

## 2021-07-27 ENCOUNTER — Ambulatory Visit (INDEPENDENT_AMBULATORY_CARE_PROVIDER_SITE_OTHER): Payer: Medicare HMO | Admitting: Family Medicine

## 2021-07-27 ENCOUNTER — Other Ambulatory Visit: Payer: Self-pay

## 2021-07-27 VITALS — BP 122/58 | HR 81 | Temp 97.6°F | Resp 18 | Ht 74.0 in | Wt 137.0 lb

## 2021-07-27 DIAGNOSIS — Z794 Long term (current) use of insulin: Secondary | ICD-10-CM

## 2021-07-27 DIAGNOSIS — R918 Other nonspecific abnormal finding of lung field: Secondary | ICD-10-CM | POA: Diagnosis not present

## 2021-07-27 DIAGNOSIS — E1140 Type 2 diabetes mellitus with diabetic neuropathy, unspecified: Secondary | ICD-10-CM

## 2021-07-27 NOTE — Progress Notes (Signed)
Subjective:    Patient ID: [name], male    DOB: [birth year], 87 y.o.   MRN: [id number]  HPI  07/09/21 Patient is a very pleasant 87 year old Caucasian gentleman with a history of a cavitary nodule in his right upper lobe who presents with a high fever and cough 48 hours.  Symptoms began yesterday.  Fever was as high as 103 at home.  He denies any chest pain but he does report a cough productive of yellow mucus.  He denies any pleurisy or hemoptysis or shortness of breath.  He does report diffuse body aches myalgias fever chills.  COVID test this morning at home was negative.  Flu test was performed here today and was negative.  He denies any otalgia.  He denies any sore throat.  He denies any significant rhinorrhea or sinus pain.  He denies any nausea vomiting or diarrhea.  He denies any dysuria.  There is no known exposure to COVID or the flu.  At that time, my plan was: COVID test at home is negative.  Flu test here today is negative.  However his fever his dramatic 103.  He appears ill.  He has a history of pneumonia.  I will treat the patient aggressively with Augmentin 875 mg twice daily for 10 days for possible streptococcal pneumonia.  We will send the patient for chest x-ray for further evaluation.  Recommended repeating COVID test in 48 hours to confirm that she truly negative.  If positive, I will switch the patient to molnupiravir.    07/27/21 Chest x-ray showed possible right middle lobe pneumonia.  We treated the patient with Z-Pak and Augmentin.  However he is not improving.  He continues to report cough.  He reports pain radiating into his right shoulder blade.  He reports fatigue and lethargy.  He continues to have occasional subjective fevers.  Chest x-ray shows opacity in the right lung.  Patient has a history of an abnormal mass in the right lung.  There was a cavitary lesion in the right upper lobe.  Patient had a PET scan 2021: 1. Mild FDG uptake associated with cavitary  lesion in the RIGHT upper lobe. Infection or neoplasm could have this appearance. Bronchogenic neoplasm remains in the differential with hypermetabolic RIGHT hilar nodal tissue. 2. RIGHT lower lobe process including small lymph nodes along the bronchovascular structures compatible with infectious or inflammatory changes, associated with tree-in-bud opacities in the RIGHT lower lobe. The extent to which nodal disease in the chest is related to potential neoplasm or inflammation is unclear. Would also correlate with any respiratory symptoms or risk factors for aspiration. Patient's pulmonologist recommended bronchoscopy and biopsy but the patient declined.  My concern is that the patient may now have postobstructive pneumonia from worsening neoplastic process Past Medical History:  Diagnosis Date   Acute venous embolism and thrombosis of unspecified deep vessels of lower extremity    Atrial flutter (HCC)    and SVT that is post ablation   CAD (coronary artery disease)    Chronic kidney disease    stones   Coronary atherosclerosis 11/19/2008   Qualifier: Diagnosis of  By: Stanford Breed, MD, Kandyce Rud    Dyslipidemia    GERD (gastroesophageal reflux disease)    HTN (hypertension)    Shortness of breath    on exertion   Past Surgical History:  Procedure Laterality Date   CARDIAC CATHETERIZATION     s/p cardiac catheterization and bypass surgery as well as EGD  with dilation x3, left lower extremity vein stripping   CORONARY ARTERY BYPASS GRAFT     ESOPHAGOGASTRODUODENOSCOPY N/A 12/06/2013   Procedure: ESOPHAGOGASTRODUODENOSCOPY (EGD);  Surgeon: Lear Ng, MD;  Location: [facility];  Service: Endoscopy;  Laterality: N/A;   left knee anthroscopy     SECONDARY CLOSURE ARM     VIDEO BRONCHOSCOPY Bilateral 01/14/2018   Procedure: VIDEO BRONCHOSCOPY WITHOUT FLUORO;  Surgeon: Tanda Rockers, MD;  Location: [facility];  Service: Cardiopulmonary;  Laterality: Bilateral;    Current Outpatient Medications on File Prior to Visit  Medication Sig Dispense Refill   albuterol (PROVENTIL) (2.5 MG/3ML) 0.083% nebulizer solution Take 3 mLs (2.5 mg total) by nebulization every 6 (six) hours as needed for wheezing or shortness of breath. 75 mL 12   aspirin 325 MG tablet Take 325 mg by mouth daily.     blood glucose meter kit and supplies Dispense based on patient and insurance preference. Use up to four times daily as directed. (FOR ICD-10 E10.9, E11.9). 1 each 0   clobetasol cream (TEMOVATE) 0.05 % Please specify directions, refills and quantity 1 g 0   clotrimazole (LOTRIMIN) 1 % cream Apply 1 application topically 2 (two) times daily. Apply to the head of the penis 30 g 3   glucose blood (ONETOUCH ULTRA) test strip USE TO CHECK BLOOD SUGAR 4 TO 6 TIMES DAILY. E11.9 100 strip 9   HYDROcodone-acetaminophen (NORCO) 5-325 MG tablet Take 1 tablet by mouth every 6 (six) hours as needed for moderate pain. 30 tablet 0   hydrOXYzine (ATARAX/VISTARIL) 25 MG tablet TAKE 1 TABLET BY MOUTH 3 TIMES DAILY AS NEEDED FOR ITCHING. 30 tablet 3   Insulin Syringe-Needle U-100 (BD INSULIN SYRINGE U/F) 31G X 5/16" 0.3 ML MISC USE WITH NOVOLIN TWICE DAY WITH A MEAL 100 each 5   nitroGLYCERIN (NITROSTAT) 0.4 MG SL tablet Place 0.4 mg under the tongue every 5 (five) minutes as needed for chest pain.     NOVOLIN 70/30 (70-30) 100 UNIT/ML injection INJECT 8 UNITS SQ IN THE MORNING AND INJECT 12 UNITS SQ AT BEDTIME 10 mL 11   OneTouch Delica Lancets 48G MISC USE TO CHECK BLOOD SUAGR 3 TIMES A DAY DX:E11.9 100 each 5   pantoprazole (PROTONIX) 40 MG tablet TAKE 1 TABLET BY MOUTH EVERY DAY 90 tablet 3   pravastatin (PRAVACHOL) 40 MG tablet Take 1 tablet (40 mg total) by mouth daily. 90 tablet 1   tamsulosin (FLOMAX) 0.4 MG CAPS capsule Take 1 capsule (0.4 mg total) by mouth daily. 90 capsule 1   triamcinolone cream (KENALOG) 0.1 % APPLY TO AFFECTED AREA TWICE A DAY 454 g 3   No current  facility-administered medications on file prior to visit.   Allergies  Allergen Reactions   Celebrex [Celecoxib] Rash and Other (See Comments)    Blister   Social History   Socioeconomic History   Marital status: Widowed    Spouse name: Not on file   Number of children: Not on file   Years of education: Not on file   Highest education level: Not on file  Occupational History   Not on file  Tobacco Use   Smoking status: Every Day    Packs/day: 1.00    Years: 60.00    Pack years: 60.00    Types: Cigarettes   Smokeless tobacco: Never  Substance and Sexual Activity   Alcohol use: No   Drug use: No   Sexual activity: Not on file  Other Topics Concern  Not on file  Social History Narrative   Lives in [place] with his wife and 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    Social Determinants of Health   Financial Resource Strain: Not on file  Food Insecurity: No Food Insecurity   Worried About Charity fundraiser in the Last Year: Never true   Ran Out of Food in the Last Year: Never true  Transportation Needs: Unknown   Lack of Transportation (Medical): No   Lack of Transportation (Non-Medical): Not on file  Physical Activity: Not on file  Stress: Not on file  Social Connections: Not on file  Intimate Partner Violence: Not on file      Review of Systems     Objective:   Physical Exam Constitutional:      General: He is not in acute distress.    Appearance: Normal appearance. He is normal weight. He is ill-appearing. He is not toxic-appearing.  HENT:     Right Ear: Tympanic membrane and ear canal normal.     Left Ear: Tympanic membrane and ear canal normal.  Cardiovascular:     Rate and Rhythm: Normal rate and regular rhythm.     Heart sounds: Normal heart sounds. No murmur heard.   No friction rub. No gallop.  Pulmonary:     Effort: Pulmonary effort is normal. No tachypnea, accessory muscle usage or  respiratory distress.     Breath sounds: Normal breath sounds. Decreased air movement present. No wheezing, rhonchi or rales.  Abdominal:     General: Abdomen is flat. Bowel sounds are normal. There is no distension.     Palpations: Abdomen is soft.     Tenderness: There is no abdominal tenderness. There is no guarding.  Neurological:     Mental Status: He is alert.  Bilateral carotid bruit       Assessment & Plan:  Mass of middle lobe of right lung - Plan: Hemoglobin A1c, CBC with Differential/Platelet, COMPLETE METABOLIC PANEL WITH GFR  Type 2 diabetes mellitus with diabetic neuropathy, with long-term current use of insulin ([place]) - Plan: Hemoglobin A1c I feel we need to repeat the CAT scan.  There was marked opacity in the right middle lobe on the recent chest x-ray.  Patient has not responded to antibiotics.  My concern is that there is neoplasm and postobstructive pneumonia.  Recommend a CT scan of the chest to evaluate further.  Obtain baseline lab work today also to monitor renal function along with diabetes management

## 2021-07-28 DIAGNOSIS — J449 Chronic obstructive pulmonary disease, unspecified: Secondary | ICD-10-CM | POA: Diagnosis not present

## 2021-07-28 LAB — COMPLETE METABOLIC PANEL WITH GFR
AG Ratio: 0.9 (calc) — ABNORMAL LOW (ref 1.0–2.5)
ALT: 5 U/L — ABNORMAL LOW (ref 9–46)
AST: 12 U/L (ref 10–35)
Albumin: 3.7 g/dL (ref 3.6–5.1)
Alkaline phosphatase (APISO): 65 U/L (ref 35–144)
BUN/Creatinine Ratio: 18 (calc) (ref 6–22)
BUN: 27 mg/dL — ABNORMAL HIGH (ref 7–25)
CO2: 27 mmol/L (ref 20–32)
Calcium: 9.4 mg/dL (ref 8.6–10.3)
Chloride: 104 mmol/L (ref 98–110)
Creat: 1.5 mg/dL — ABNORMAL HIGH (ref 0.70–1.22)
Globulin: 4 g/dL (calc) — ABNORMAL HIGH (ref 1.9–3.7)
Glucose, Bld: 187 mg/dL — ABNORMAL HIGH (ref 65–99)
Potassium: 5.2 mmol/L (ref 3.5–5.3)
Sodium: 139 mmol/L (ref 135–146)
Total Bilirubin: 0.4 mg/dL (ref 0.2–1.2)
Total Protein: 7.7 g/dL (ref 6.1–8.1)
eGFR: 46 mL/min/{1.73_m2} — ABNORMAL LOW (ref >=60)

## 2021-07-28 LAB — CBC WITH DIFFERENTIAL/PLATELET
Absolute Monocytes: 836 cells/uL (ref 200–950)
Basophils Absolute: 82 cells/uL (ref 0–200)
Basophils Relative: 1 %
Eosinophils Absolute: 271 cells/uL (ref 15–500)
Eosinophils Relative: 3.3 %
HCT: 37.5 % — ABNORMAL LOW (ref 38.5–50.0)
Hemoglobin: 12.1 g/dL — ABNORMAL LOW (ref 13.2–17.1)
Lymphs Abs: 2345 cells/uL (ref 850–3900)
MCH: 28.5 pg (ref 27.0–33.0)
MCHC: 32.3 g/dL (ref 32.0–36.0)
MCV: 88.4 fL (ref 80.0–100.0)
MPV: 10.2 fL (ref 7.5–12.5)
Monocytes Relative: 10.2 %
Neutro Abs: 4666 cells/uL (ref 1500–7800)
Neutrophils Relative %: 56.9 %
Platelets: 220 10*3/uL (ref 140–400)
RBC: 4.24 10*6/uL (ref 4.20–5.80)
RDW: 13.9 % (ref 11.0–15.0)
Total Lymphocyte: 28.6 %
WBC: 8.2 10*3/uL (ref 3.8–10.8)

## 2021-07-28 LAB — HEMOGLOBIN A1C
Hgb A1c MFr Bld: 7.5 % of total Hgb — ABNORMAL HIGH (ref <5.7)
Mean Plasma Glucose: 169 mg/dL
eAG (mmol/L): 9.3 mmol/L

## 2021-08-01 ENCOUNTER — Other Ambulatory Visit: Payer: Medicare HMO

## 2021-08-02 ENCOUNTER — Ambulatory Visit
Admission: RE | Admit: 2021-08-02 | Discharge: 2021-08-02 | Disposition: A | Discharge status: Home / Self Care | Payer: Medicare HMO | Source: Ambulatory Visit | Attending: Family Medicine | Admitting: Family Medicine

## 2021-08-02 DIAGNOSIS — I2510 Atherosclerotic heart disease of native coronary artery without angina pectoris: Secondary | ICD-10-CM | POA: Diagnosis not present

## 2021-08-02 DIAGNOSIS — J432 Centrilobular emphysema: Secondary | ICD-10-CM | POA: Diagnosis not present

## 2021-08-02 DIAGNOSIS — J9811 Atelectasis: Secondary | ICD-10-CM | POA: Diagnosis not present

## 2021-08-02 DIAGNOSIS — J929 Pleural plaque without asbestos: Secondary | ICD-10-CM | POA: Diagnosis not present

## 2021-08-02 DIAGNOSIS — R918 Other nonspecific abnormal finding of lung field: Secondary | ICD-10-CM

## 2021-08-02 MED ORDER — IOPAMIDOL (ISOVUE-300) INJECTION 61%
75.0000 mL | Freq: Once | INTRAVENOUS | Status: AC | PRN
  Administered 2021-08-02: 75 mL via INTRAVENOUS

## 2021-08-06 ENCOUNTER — Other Ambulatory Visit: Payer: Self-pay | Admitting: Family Medicine

## 2021-08-06 ENCOUNTER — Telehealth: Payer: Self-pay

## 2021-08-06 DIAGNOSIS — J984 Other disorders of lung: Secondary | ICD-10-CM

## 2021-08-06 NOTE — Telephone Encounter (Signed)
Error in information, please disregard.

## 2021-08-06 NOTE — Telephone Encounter (Signed)
Pcp-fyi  Grandson called in concern about pt bp being low and that stool is black. Ambulance called to check on him this morning and check bp at 140/80  Will have front desk call pt for sooner appt

## 2021-08-07 ENCOUNTER — Telehealth: Payer: Self-pay

## 2021-08-07 NOTE — Telephone Encounter (Signed)
Spoke with [name] (granddaughter, DPR) and reviewed CT results and recommendations. She states pt is not interested in biopsy of lung mass at this time.   Nothing further needed at this time.

## 2021-08-15 ENCOUNTER — Telehealth: Payer: Self-pay | Admitting: Family Medicine

## 2021-08-15 NOTE — Telephone Encounter (Signed)
Left message for patient's granddaughter, [name], to call back and schedule Medicare Annual Wellness Visit (AWV) in office.   If not able to come in office, please offer to do virtually or by telephone.  Left office number and my jabber [phone].  Last AWV:05/08/2020  Please schedule at anytime with Nurse Health Advisor.

## 2021-08-22 ENCOUNTER — Ambulatory Visit (HOSPITAL_COMMUNITY)
Admission: RE | Admit: 2021-08-22 | Discharge: 2021-08-22 | Disposition: A | Discharge status: Home / Self Care | Payer: Medicare HMO | Source: Ambulatory Visit | Attending: Cardiovascular Disease | Admitting: Cardiovascular Disease

## 2021-08-22 ENCOUNTER — Other Ambulatory Visit: Payer: Self-pay

## 2021-08-22 DIAGNOSIS — I6523 Occlusion and stenosis of bilateral carotid arteries: Secondary | ICD-10-CM | POA: Diagnosis not present

## 2021-08-22 DIAGNOSIS — I679 Cerebrovascular disease, unspecified: Secondary | ICD-10-CM | POA: Insufficient documentation

## 2021-08-25 ENCOUNTER — Encounter: Payer: Self-pay | Admitting: Family Medicine

## 2021-08-25 DIAGNOSIS — J449 Chronic obstructive pulmonary disease, unspecified: Secondary | ICD-10-CM | POA: Diagnosis not present

## 2021-08-28 ENCOUNTER — Telehealth: Payer: Self-pay | Admitting: Internal Medicine

## 2021-08-28 NOTE — Telephone Encounter (Signed)
Lm for patient's daughter, Amanda(DPR) ?

## 2021-08-28 NOTE — Telephone Encounter (Signed)
Can use neb every 4 hours as needed ? ?Will need to be seen 3/9 if not improving  - if better than 7 more days of amox 500 tid should do  ? ?Check 02 sats and call if staying < 70% ?

## 2021-08-28 NOTE — Telephone Encounter (Signed)
Spoke to patient's granddaughter, Amanda(DPR) ?[name] stated that PNA showed on CT 08/04/2021. ?She is concerned that pt has PNA again.  ?On Saturday patient developed a temp of 102, prod cough with white to green sputum,weakness and increased sob. ?[name] stated patient on Amoxicillin 500mg  TID that dentist prescribed PRN for dental infection. ?He will complete course on Thursday.   ?Patient starting wearing oxygen during the day due to SOB. [name] is not sure how many liters patient is using. He does not monitor his oxygen levels.  ?Pt is not using albuterol neb treatments.  ? ? ?Dr. Melvyn Novas, please advise. Thanks ? ? ? ? ? ?

## 2021-08-28 NOTE — Telephone Encounter (Signed)
Patient's granddaughter, Amanda(DPR) is aware of recommendations and voiced her understanding.  ?She will call back on 08/30/2021 if pt is not improving.  ?Nothing further needed at this time.  ? ?

## 2021-08-29 ENCOUNTER — Other Ambulatory Visit: Payer: Self-pay | Admitting: Family Medicine

## 2021-08-29 MED ORDER — LEVOFLOXACIN 500 MG PO TABS: 500.0000 mg | ORAL_TABLET | Freq: Every day | ORAL | 0 refills | Status: AC

## 2021-08-31 ENCOUNTER — Other Ambulatory Visit: Payer: Self-pay

## 2021-08-31 ENCOUNTER — Ambulatory Visit (INDEPENDENT_AMBULATORY_CARE_PROVIDER_SITE_OTHER): Payer: Medicare HMO

## 2021-08-31 VITALS — BP 120/62 | HR 71 | Ht 72.0 in | Wt 138.0 lb

## 2021-08-31 DIAGNOSIS — Z0000 Encounter for general adult medical examination without abnormal findings: Secondary | ICD-10-CM

## 2021-08-31 NOTE — Progress Notes (Signed)
Subjective:   [name] is a 87 y.o. male who presents for Medicare Annual/Subsequent preventive examination.  Review of Systems     Cardiac Risk Factors include: advanced age (>64mn, >>32women);diabetes mellitus;hypertension;dyslipidemia;male gender;smoking/ tobacco exposure;sedentary lifestyle     Objective:    Today's Vitals   08/31/21 1048 08/31/21 1054  BP: 120/62   Pulse: 71   SpO2: 98%   Weight: 138 lb (62.6 kg)   Height: 6' (1.829 m)   PainSc:  5    Body mass index is 18.72 kg/m.  Advanced Directives 08/31/2021 04/23/2021 05/08/2020 01/14/2018 07/29/2017 04/24/2015 12/06/2013  Does Patient Have a Medical Advance Directive? No Yes Yes No No No Patient does not have advance directive  Type of Advance Directive - [place]hy[place]Living will HBarnsdallLiving will - - - -  Does patient want to make changes to medical advance directive? - No - Patient declined - - - - -  Copy of [place] Chart? - No - copy requested - - - - -  Would patient like information on creating a medical advance directive? No - Patient declined - - No - Patient declined - No - patient declined information -    Current Medications (verified) Outpatient Encounter Medications as of 08/31/2021  Medication Sig   albuterol (PROVENTIL) (2.5 MG/3ML) 0.083% nebulizer solution Take 3 mLs (2.5 mg total) by nebulization every 6 (six) hours as needed for wheezing or shortness of breath.   aspirin 325 MG tablet Take 325 mg by mouth daily.   blood glucose meter kit and supplies Dispense based on patient and insurance preference. Use up to four times daily as directed. (FOR ICD-10 E10.9, E11.9).   clobetasol cream (TEMOVATE) 0.05 % Please specify directions, refills and quantity   clotrimazole (LOTRIMIN) 1 % cream Apply 1 application topically 2 (two) times daily. Apply to the head of the penis   glucose blood (ONETOUCH ULTRA) test strip USE TO CHECK BLOOD  SUGAR 4 TO 6 TIMES DAILY. E11.9   HYDROcodone-acetaminophen (NORCO) 5-325 MG tablet Take 1 tablet by mouth every 6 (six) hours as needed for moderate pain.   hydrOXYzine (ATARAX/VISTARIL) 25 MG tablet TAKE 1 TABLET BY MOUTH 3 TIMES DAILY AS NEEDED FOR ITCHING.   Insulin Syringe-Needle U-100 (BD INSULIN SYRINGE U/F) 31G X 5/16" 0.3 ML MISC USE WITH NOVOLIN TWICE DAY WITH A MEAL   levofloxacin (LEVAQUIN) 500 MG tablet Take 1 tablet (500 mg total) by mouth daily for 7 days.   nitroGLYCERIN (NITROSTAT) 0.4 MG SL tablet Place 0.4 mg under the tongue every 5 (five) minutes as needed for chest pain.   NOVOLIN 70/30 (70-30) 100 UNIT/ML injection INJECT 8 UNITS SQ IN THE MORNING AND INJECT 12 UNITS SQ AT BEDTIME   OneTouch Delica Lancets 310FMISC USE TO CHECK BLOOD SUAGR 3 TIMES A DAY DX:E11.9   pantoprazole (PROTONIX) 40 MG tablet TAKE 1 TABLET BY MOUTH EVERY DAY   pravastatin (PRAVACHOL) 40 MG tablet Take 1 tablet (40 mg total) by mouth daily.   tamsulosin (FLOMAX) 0.4 MG CAPS capsule Take 1 capsule (0.4 mg total) by mouth daily.   triamcinolone cream (KENALOG) 0.1 % APPLY TO AFFECTED AREA TWICE A DAY   No facility-administered encounter medications on file as of 08/31/2021.    Allergies (verified) Celebrex [celecoxib]   History: Past Medical History:  Diagnosis Date   Acute venous embolism and thrombosis of unspecified deep vessels of lower extremity  Atrial flutter (HCC)    and SVT that is post ablation   CAD (coronary artery disease)    Chronic kidney disease    stones   Coronary atherosclerosis 11/19/2008   Qualifier: Diagnosis of  By: Stanford Breed, MD, Kandyce Rud    Dyslipidemia    GERD (gastroesophageal reflux disease)    HTN (hypertension)    Shortness of breath    on exertion   Past Surgical History:  Procedure Laterality Date   CARDIAC CATHETERIZATION     s/p cardiac catheterization and bypass surgery as well as EGD with dilation x3, left lower extremity vein stripping    CORONARY ARTERY BYPASS GRAFT     ESOPHAGOGASTRODUODENOSCOPY N/A 12/06/2013   Procedure: ESOPHAGOGASTRODUODENOSCOPY (EGD);  Surgeon: Lear Ng, MD;  Location: [facility];  Service: Endoscopy;  Laterality: N/A;   left knee anthroscopy     SECONDARY CLOSURE ARM     VIDEO BRONCHOSCOPY Bilateral 01/14/2018   Procedure: VIDEO BRONCHOSCOPY WITHOUT FLUORO;  Surgeon: Tanda Rockers, MD;  Location: [facility];  Service: Cardiopulmonary;  Laterality: Bilateral;   No family history on file. 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    Wife passed in 2019. Was married x 61 years. Lost 1 granddaughter in 2020 in [place].    2 sons, 2 grandchildren and 3 great grandchildren.    Social Determinants of Health   Financial Resource Strain: Low Risk    Difficulty of Paying Living Expenses: Not hard at all  Food Insecurity: No Food Insecurity   Worried About Charity fundraiser in the Last Year: Never true   [place] in the Last Year: Never true  Transportation Needs: No Transportation Needs   Lack of Transportation (Medical): No   Lack of Transportation (Non-Medical): No  Physical Activity: Insufficiently Active   Days of Exercise per Week: 3 days   Minutes of Exercise per Session: 20 min  Stress: No Stress Concern Present   Feeling of Stress : Only a little  Social  Connections: Moderately Isolated   Frequency of Communication with Friends and Family: More than three times a week   Frequency of Social Gatherings with Friends and Family: More than three times a week   Attends Religious Services: 1 to 4 times per year   Active Member of [employer] or Organizations: Not on file   Attends Archivist Meetings: Never   Marital Status: Widowed    Tobacco Counseling Ready to quit: Not Answered Counseling given: Not Answered   Clinical Intake:  Pre-visit preparation completed: Yes  Pain : 0-10 Pain Score: 5  Pain Type: Acute pain Pain Location: Hand Pain Descriptors / Indicators: Aching, Dull Pain Onset: In the  past 7 days Pain Frequency: Intermittent     BMI - recorded: 17.72 Nutritional Status: BMI <19  Underweight Nutritional Risks: None Diabetes: Yes  How often do you need to have someone help you when you read instructions, pamphlets, or other written materials from your doctor or pharmacy?: 1 - Never  Diabetic?Nutrition Risk Assessment:  Has the patient had any N/V/D within the last 2 months?  No  Does the patient have any non-healing wounds?  No  Has the patient had any unintentional weight loss or weight gain?  No   Diabetes:  Is the patient diabetic?  Yes  If diabetic, was a CBG obtained today?  No  Did the patient bring in their glucometer from home?  No  How often do you monitor your CBG's? BID.   Financial Strains and Diabetes Management:  Are you having any financial strains with the device, your supplies or your medication? No .  Does the patient want to be seen by Chronic Care Management for management of their diabetes?  No  Would the patient like to be referred to a Nutritionist or for Diabetic Management?  No   Diabetic Exams:  Diabetic Eye Exam: Completed 2022. Pt has been advised about the importance in completing this exam.  Diabetic Foot Exam: Completed DUE. Pt has been advised about the importance in  completing this exam.  Interpreter Needed?: No  Information entered by :: mj Naphtali Riede, lpn   Activities of Daily Living In your present state of health, do you have any difficulty performing the following activities: 08/31/2021  Hearing? Y  Comment Very HOH  Vision? N  Difficulty concentrating or making decisions? N  Walking or climbing stairs? N  Dressing or bathing? N  Doing errands, shopping? N  Preparing Food and eating ? N  Using the Toilet? N  In the past six months, have you accidently leaked urine? N  Do you have problems with loss of bowel control? N  Managing your Medications? N  Managing your Finances? N  Housekeeping or managing your Housekeeping? N  Some recent data might be hidden    Patient Care Team: Susy Frizzle, MD as PCP - General (Family Medicine) Stanford Breed Denice Bors, MD as PCP - Cardiology (Cardiology) Kassie Mends, RN as [place] any recent [place] you may have received from other than [facility] providers in the past year (date may be approximate).     Assessment:   This is a routine wellness examination for [place].  Hearing/Vision screen Hearing Screening - Comments:: Very hard of hearing. Vision Screening - Comments:: Readers. Dr. Francesca Oman. 2022.  Dietary issues and exercise activities discussed: Current Exercise Habits: Home exercise routine, Type of exercise: walking, Time (Minutes): 30, Frequency (Times/Week): 3, Weekly Exercise (Minutes/Week): 90, Intensity: Mild, Exercise limited by: cardiac condition(s);respiratory conditions(s)   Goals Addressed             This Visit's Progress    Exercise 3x per week (30 min per time)       Continue to move and stay healthy.       Depression Screen PHQ 2/9 Scores 08/31/2021 04/23/2021 05/08/2020 03/24/2017 09/03/2016 05/02/2016  PHQ - 2 Score 1 1 0 0 0 0  PHQ- 9 Score - - - - 2 -    Fall Risk Fall Risk  08/31/2021 04/23/2021 05/08/2020  03/24/2017 09/03/2016  Falls in the past year? 0 0 0 No Yes  Number falls in past yr: 0 -  0 - 1  Injury with Fall? 0 - 0 - No  Risk for fall due to : Impaired balance/gait - - - -  Follow up Falls prevention discussed - - - -    FALL RISK PREVENTION PERTAINING TO THE HOME:  Any stairs in or around the home? Yes  If so, are there any without handrails? No  Home free of loose throw rugs in walkways, pet beds, electrical cords, etc? Yes  Adequate lighting in your home to reduce risk of falls? Yes   ASSISTIVE DEVICES UTILIZED TO PREVENT FALLS:  Life alert? No  Use of a cane, walker or w/c? No  Grab bars in the bathroom? No  Shower chair or bench in shower? Yes  Elevated toilet seat or a handicapped toilet? Yes   TIMED UP AND GO:  Was the test performed? No .    Cognitive Function: Normal cognitive status assessed by direct observation by this Nurse Health Advisor. No abnormalities found.  6CIT not performed due to patient's hearing loss.         Immunizations Immunization History  Administered Date(s) Administered   Fluad Quad(high Dose 65+) 03/31/2019, 03/29/2021   Influenza, High Dose Seasonal PF 03/31/2017, 03/12/2018, 03/09/2020   Influenza,inj,Quad PF,6+ Mos 05/03/2016   PFIZER(Purple Top)SARS-COV-2 Vaccination 07/30/2019, 08/24/2019, 04/27/2020   Pfizer Covid-19 Vaccine Bivalent Booster 34yr & up 05/14/2021   Pneumococcal Polysaccharide-23 05/02/2016   Tdap 04/24/2015   Zoster Recombinat (Shingrix) 03/13/2020, 05/11/2021    TDAP status: Up to date  Flu Vaccine status: Up to date  Pneumococcal vaccine status: Due, Education has been provided regarding the importance of this vaccine. Advised may receive this vaccine at local pharmacy or Health Dept. Aware to provide a copy of the vaccination record if obtained from local pharmacy or Health Dept. Verbalized acceptance and understanding.  Covid-19 vaccine status: Completed vaccines  Qualifies for Shingles Vaccine?  Yes   Zostavax completed No   Shingrix Completed?: No.    Education has been provided regarding the importance of this vaccine. Patient has been advised to call insurance company to determine out of pocket expense if they have not yet received this vaccine. Advised may also receive vaccine at local pharmacy or Health Dept. Verbalized acceptance and understanding.  Screening Tests Health Maintenance  Topic Date Due   FOOT EXAM  Never done   URINE MICROALBUMIN  Never done   Pneumonia Vaccine 85 Years old (2 - PCV) 05/02/2017   OPHTHALMOLOGY EXAM  11/24/2019   HEMOGLOBIN A1C  01/24/2022   TETANUS/TDAP  04/23/2025   INFLUENZA VACCINE  Completed   COVID-19 Vaccine  Completed   Zoster Vaccines- Shingrix  Completed   HPV VACCINES  Aged Out    Health Maintenance  Health Maintenance Due  Topic Date Due   FOOT EXAM  Never done   URINE MICROALBUMIN  Never done   Pneumonia Vaccine 87 Years old (2 - PCV) 05/02/2017   OPHTHALMOLOGY EXAM  11/24/2019    Colorectal cancer screening: No longer required.   Lung Cancer Screening: (Low Dose CT Chest recommended if Age 85-80years, 30 pack-year currently smoking OR have quit w/in 15years.) does not qualify.    Additional Screening:  Hepatitis C Screening: does not qualify;  Vision Screening: Recommended annual ophthalmology exams for early detection of glaucoma and other disorders of the eye. Is the patient up to date with their annual eye exam?  Yes  Who is the provider or what is the name of the office in which  the patient attends annual eye exams? Dr. Francesca Oman.  If pt is not established with a provider, would they like to be referred to a provider to establish care? No .   Dental Screening: Recommended annual dental exams for proper oral hygiene  Community Resource Referral / Chronic Care Management: CRR required this visit?  No   CCM required this visit?  No      Plan:     I have personally reviewed and noted the following  in the patients chart:   Medical and social history Use of alcohol, tobacco or illicit drugs  Current medications and supplements including opioid prescriptions. Patient is currently taking opioid prescriptions. Information provided to patient regarding non-opioid alternatives. Patient advised to discuss non-opioid treatment plan with their provider. Functional ability and status Nutritional status Physical activity Advanced directives List of other physicians Hospitalizations, surgeries, and ER visits in previous 12 months Vitals Screenings to include cognitive, depression, and falls Referrals and appointments  In addition, I have reviewed and discussed with patient certain preventive protocols, quality metrics, and best practice recommendations. A written personalized care plan for preventive services as well as general preventive health recommendations were provided to patient.     Chriss Driver, LPN   9/32/6712   Nurse Notes: Pt is up to date on age appropriate health maintenance and vaccines.

## 2021-08-31 NOTE — Patient Instructions (Signed)
[name] , Thank you for taking time to come for your Medicare Wellness Visit. I appreciate your ongoing commitment to your health goals. Please review the following plan we discussed and let me know if I can assist you in the future.   Screening recommendations/referrals: Colonoscopy: No longer required due to age.  Recommended yearly ophthalmology/optometry visit for glaucoma screening and checkup Recommended yearly dental visit for hygiene and checkup  Vaccinations: Influenza vaccine: Done 03/29/2021 Repeat annually  Pneumococcal vaccine: Done 05/12/2016 Second dose due at your convenience.  Tdap vaccine: done 04/24/2015 Repeat in 10 years  Shingles vaccine: Done 05/11/2021 and 03/13/2020 Covid-19: Done 04/27/2020, 08/24/2019 and 07/30/2019  Advanced directives: Advance directive discussed with you today. Even though you declined this today, please call our office should you change your mind, and we can give you the proper paperwork for you to fill out.   Conditions/risks identified: Aim for 30 minutes of exercise or brisk walking, 6-8 glasses of water, and 5 servings of fruits and vegetables each day.   Next appointment: Follow up in one year for your annual wellness visit. 2024.  Preventive Care 5 Years and Older, Male  Preventive care refers to lifestyle choices and visits with your health care provider that can promote health and wellness. What does preventive care include? A yearly physical exam. This is also called an annual well check. Dental exams once or twice a year. Routine eye exams. Ask your health care provider how often you should have your eyes checked. Personal lifestyle choices, including: Daily care of your teeth and gums. Regular physical activity. Eating a healthy diet. Avoiding tobacco and drug use. Limiting alcohol use. Practicing safe sex. Taking low doses of aspirin every day. Taking vitamin and mineral supplements as recommended by your health care  provider. What happens during an annual well check? The services and screenings done by your health care provider during your annual well check will depend on your age, overall health, lifestyle risk factors, and family history of disease. Counseling  Your health care provider may ask you questions about your: Alcohol use. Tobacco use. Drug use. Emotional well-being. Home and relationship well-being. Sexual activity. Eating habits. History of falls. Memory and ability to understand (cognition). Work and work Statistician. Screening  You may have the following tests or measurements: Height, weight, and BMI. Blood pressure. Lipid and cholesterol levels. These may be checked every 5 years, or more frequently if you are over 50 years old. Skin check. Lung cancer screening. You may have this screening every year starting at age 49 if you have a 30-pack-year history of smoking and currently smoke or have quit within the past 15 years. Fecal occult blood test (FOBT) of the stool. You may have this test every year starting at age 43. Flexible sigmoidoscopy or colonoscopy. You may have a sigmoidoscopy every 5 years or a colonoscopy every 10 years starting at age 40. Prostate cancer screening. Recommendations will vary depending on your family history and other risks. Hepatitis C blood test. Hepatitis B blood test. Sexually transmitted disease (STD) testing. Diabetes screening. This is done by checking your blood sugar (glucose) after you have not eaten for a while (fasting). You may have this done every 1-3 years. Abdominal aortic aneurysm (AAA) screening. You may need this if you are a current or former smoker. Osteoporosis. You may be screened starting at age 27 if you are at high risk. Talk with your health care provider about your test results, treatment options, and if  necessary, the need for more tests. Vaccines  Your health care provider may recommend certain vaccines, such  as: Influenza vaccine. This is recommended every year. Tetanus, diphtheria, and acellular pertussis (Tdap, Td) vaccine. You may need a Td booster every 10 years. Zoster vaccine. You may need this after age 33. Pneumococcal 13-valent conjugate (PCV13) vaccine. One dose is recommended after age 53. Pneumococcal polysaccharide (PPSV23) vaccine. One dose is recommended after age 13. Talk to your health care provider about which screenings and vaccines you need and how often you need them. This information is not intended to replace advice given to you by your health care provider. Make sure you discuss any questions you have with your health care provider. Document Released: 07/07/2015 Document Revised: 02/28/2016 Document Reviewed: 04/11/2015 Elsevier Interactive Patient Education  2017 [place] Prevention in the Home Falls can cause injuries. They can happen to people of all ages. There are many things you can do to make your home safe and to help prevent falls. What can I do on the outside of my home? Regularly fix the edges of walkways and driveways and fix any cracks. Remove anything that might make you trip as you walk through a door, such as a raised step or threshold. Trim any bushes or trees on the path to your home. Use bright outdoor lighting. Clear any walking paths of anything that might make someone trip, such as rocks or tools. Regularly check to see if handrails are loose or broken. Make sure that both sides of any steps have handrails. Any raised decks and porches should have guardrails on the edges. Have any leaves, snow, or ice cleared regularly. Use sand or salt on walking paths during winter. Clean up any spills in your garage right away. This includes oil or grease spills. What can I do in the bathroom? Use night lights. Install grab bars by the toilet and in the tub and shower. Do not use towel bars as grab bars. Use non-skid mats or decals in the tub or  shower. If you need to sit down in the shower, use a plastic, non-slip stool. Keep the floor dry. Clean up any water that spills on the floor as soon as it happens. Remove soap buildup in the tub or shower regularly. Attach bath mats securely with double-sided non-slip rug tape. Do not have throw rugs and other things on the floor that can make you trip. What can I do in the bedroom? Use night lights. Make sure that you have a light by your bed that is easy to reach. Do not use any sheets or blankets that are too big for your bed. They should not hang down onto the floor. Have a firm chair that has side arms. You can use this for support while you get dressed. Do not have throw rugs and other things on the floor that can make you trip. What can I do in the kitchen? Clean up any spills right away. Avoid walking on wet floors. Keep items that you use a lot in easy-to-reach places. If you need to reach something above you, use a strong step stool that has a grab bar. Keep electrical cords out of the way. Do not use floor polish or wax that makes floors slippery. If you must use wax, use non-skid floor wax. Do not have throw rugs and other things on the floor that can make you trip. What can I do with my stairs? Do not leave any items  on the stairs. Make sure that there are handrails on both sides of the stairs and use them. Fix handrails that are broken or loose. Make sure that handrails are as long as the stairways. Check any carpeting to make sure that it is firmly attached to the stairs. Fix any carpet that is loose or worn. Avoid having throw rugs at the top or bottom of the stairs. If you do have throw rugs, attach them to the floor with carpet tape. Make sure that you have a light switch at the top of the stairs and the bottom of the stairs. If you do not have them, ask someone to add them for you. What else can I do to help prevent falls? Wear shoes that: Do not have high heels. Have  rubber bottoms. Are comfortable and fit you well. Are closed at the toe. Do not wear sandals. If you use a stepladder: Make sure that it is fully opened. Do not climb a closed stepladder. Make sure that both sides of the stepladder are locked into place. Ask someone to hold it for you, if possible. Clearly mark and make sure that you can see: Any grab bars or handrails. First and last steps. Where the edge of each step is. Use tools that help you move around (mobility aids) if they are needed. These include: Canes. Walkers. Scooters. Crutches. Turn on the lights when you go into a dark area. Replace any light bulbs as soon as they burn out. Set up your furniture so you have a clear path. Avoid moving your furniture around. If any of your floors are uneven, fix them. If there are any pets around you, be aware of where they are. Review your medicines with your doctor. Some medicines can make you feel dizzy. This can increase your chance of falling. Ask your doctor what other things that you can do to help prevent falls. This information is not intended to replace advice given to you by your health care provider. Make sure you discuss any questions you have with your health care provider. Document Released: 04/06/2009 Document Revised: 11/16/2015 Document Reviewed: 07/15/2014 Elsevier Interactive Patient Education  2017 [employer].

## 2021-09-10 ENCOUNTER — Other Ambulatory Visit: Payer: Self-pay | Admitting: Family Medicine

## 2021-09-10 DIAGNOSIS — E785 Hyperlipidemia, unspecified: Secondary | ICD-10-CM

## 2021-09-10 DIAGNOSIS — E1140 Type 2 diabetes mellitus with diabetic neuropathy, unspecified: Secondary | ICD-10-CM

## 2021-09-10 DIAGNOSIS — R300 Dysuria: Secondary | ICD-10-CM

## 2021-09-10 NOTE — Telephone Encounter (Signed)
Patient came to office to request refill of ? ?pravastatin (PRAVACHOL) 40 MG tablet [[id number]]  ? ?HYDROcodone-acetaminophen (NORCO) 5-325 MG tablet [[id number]]  ? ?hydrOXYzine (ATARAX/VISTARIL) 25 MG tablet [[id number]]  ? ?Pharmacy confirmed as ? ?[id number] [place], [place] - [address]  ?[address], [place] [address]  ?Phone:  [phone]  Fax:  [phone]  ?DEA #:  [id number] ? ?Please advise granddaughter [name] at [phone]. ?

## 2021-09-13 MED ORDER — PRAVASTATIN SODIUM 40 MG PO TABS: 40.0000 mg | ORAL_TABLET | Freq: Every day | ORAL | 1 refills | Status: DC

## 2021-09-13 MED ORDER — HYDROXYZINE HCL 25 MG PO TABS: ORAL_TABLET | ORAL | 3 refills | Status: DC

## 2021-09-13 MED ORDER — HYDROCODONE-ACETAMINOPHEN 5-325 MG PO TABS: 1.0000 | ORAL_TABLET | Freq: Four times a day (QID) | ORAL | 0 refills | Status: DC | PRN

## 2021-09-13 NOTE — Telephone Encounter (Signed)
LOV 07/27/21 ?Last refill 01/11/21, #30, 0 refills ? ?Please review, thanks! ? ?

## 2021-09-14 ENCOUNTER — Telehealth: Payer: Self-pay

## 2021-09-14 NOTE — Telephone Encounter (Signed)
Approvedtoday  Your request has been approved  

## 2021-09-14 NOTE — Telephone Encounter (Signed)
PA for Hydroxyzine started via CoverMyMeds ? ?[place]: [id number] - Rx #: [id number] ?

## 2021-09-17 ENCOUNTER — Ambulatory Visit (INDEPENDENT_AMBULATORY_CARE_PROVIDER_SITE_OTHER): Payer: Medicare HMO | Admitting: *Deleted

## 2021-09-17 DIAGNOSIS — E1140 Type 2 diabetes mellitus with diabetic neuropathy, unspecified: Secondary | ICD-10-CM

## 2021-09-17 DIAGNOSIS — J449 Chronic obstructive pulmonary disease, unspecified: Secondary | ICD-10-CM

## 2021-09-17 NOTE — Patient Instructions (Signed)
Visit Information ? ?Thank you for taking time to visit with me today. Please don't hesitate to contact me if I can be of assistance to you before our next scheduled telephone appointment. ? ?Following are the goals we discussed today:  ?Take medications as prescribed   ?Attend all scheduled provider appointments ?Call provider office for new concerns or questions  ?check blood sugar at prescribed times: twice daily ?enter blood sugar readings and medication or insulin into daily log ?take the blood sugar log to all doctor visits ?take the blood sugar meter to all doctor visits ?trim toenails straight across ?read food labels for fat, fiber, carbohydrates and portion size ?wear comfortable, cotton socks ?wear comfortable, well-fitting shoes ?eliminate smoking in my home ?limit outdoor activity during cold weather ?follow rescue plan if symptoms flare-up ?do breathing exercises every day ?Practice good handwashing, wear a mask as needed- avoid sick people ?Follow carbohydrate modified diet- be careful of intake of rice, potatoes, bread, pasta ?Avoid concentrated sweets ?Do not ever smoke around oxygen ?Use oxygen exactly as directed ?Purchase pulse oximeter and start checking 02 saturations ? ?Our next appointment is by telephone on 10/22/21 at 1045 am ? ?Please call the care guide team at [phone] if you need to cancel or reschedule your appointment.  ? ?If you are experiencing a Mental Health or [place] or need someone to talk to, please call the Suicide and Crisis Lifeline: 988 ?call the [place] National Suicide Prevention Lifeline: (331) 012-2616 or TTY: 925-281-2186 TTY 501-567-7551) to talk to a trained counselor ?call 1-800-273-TALK (toll free, 24 hour hotline) ?go to [facility] [address] [place] [phone]) ?call 911  ? ?Patient verbalizes understanding of instructions and care plan provided today and agrees to view in [place]. Active MyChart  status confirmed with patient.   ? ?Jacqlyn Larsen RNC, BSN ?RN Case Manager ?[place] ?[phone] ? ?Pulse Oximetry ?Pulse oximetry is a technology that measures the oxygen saturation level in the blood through the skin without the need for a blood sample. This may also be referred to as oxygen level. The device used to measure the oxygen level is called a pulse oximeter. This device also measures the heart rate (pulse). Pulse oximetry helps to assess: ?Current oxygen level, including low blood oxygen levels (hypoxemia). ?The need for or effectiveness of oxygen therapy or other treatments, including the need for more or less oxygen. ?Blood flow (circulation) to different parts of the body. ?Oxygen level during activity. ?What are the benefits? ?Benefits of pulse oximetry include: ?Not needing a blood sample to measure the oxygen level. ?The test does not hurt. ?Having the option to measure oxygen level continuously or as needed. ?An alarm to tell you when your oxygen levels are out of range if pulse oximetry is continuous. ?What are the risks? ?The risks associated with pulse oximetry are rare. However, there is a risk of skin sores if the sensor is left in the same spot for long periods of time. ?What happens during the test? ?Pulse oximetry is done using a pulse oximeter device with a light sensor attached. ?One side of the sensor passes a red beam of light through the skin, and the other side of the sensor measures the amount of light that is absorbed while it passes through. The sensor is connected to the pulse oximeter. ?The pulse oximeter uses the information from the sensor to calculate the percentage of blood cells carrying oxygen in the blood. ?The sensor  is placed on an area of the body where the beam of light can easily pass through the skin. ?For adults and children, the sensor is usually a clip placed on a finger, with the light centered over the nail bed. The sensor may also be  placed on an earlobe or toe. ?For babies, the sensor is usually a sticky tape strip that is placed around areas such as the sole of a foot or the palm of a hand. ?What can I expect after the test? ?The pulse oximetry results should be available right away. ?If your pulse oximetry results are low, you may need to use oxygen. ?The pulse oximetry results are a percentage. The normal value may vary depending on your medical condition. ?Most healthy people have oxygen saturation levels between 95% and 100%. ?Low oxygen saturation levels are below 90%. This may happen in people with lung conditions, such as long-term (chronic) obstructive pulmonary disease (COPD). ?What can affect the accuracy of the oximetry reading? ?Pulse oximetry depends on the amount of light absorbed as it passes through skin tissue. Because of this, the accuracy of this measurement can be affected by one or more of the following: ?Factors such as: ?Dark nail polish or artificial nails. ?Very dark skin. ?Shivering or too much movement. ?Bright, artificial lighting. ?Chronic smoking and recent breathing-in (inhalation) of smoke or carbon monoxide. ?Conditions such as: ?Cool skin or poor blood flow to the area where the sensor is placed. ?Sweating or very warm skin in the area where the sensor is placed. ?Anemia, or low levels of hemoglobin or red blood cells. ?Polycythemia vera. This is a bone marrow disease that causes high levels of red blood cells, white blood cells, and platelets. ?If a more accurate measurement is needed, a blood sample will be taken. ?Summary ?Pulse oximetry uses a device to measure the oxygen level in the blood. ?Pulse oximetry does not hurt. The risks associated with pulse oximetry are rare. ?Most healthy people have oxygen levels between 95% and 100%. A low oxygen saturation level is below 90%. ?People with low oxygen levels may need supplemental oxygen. ?This information is not intended to replace advice given to you by  your health care provider. Make sure you discuss any questions you have with your health care provider. ?Document Revised: 02/21/2021 Document Reviewed: 09/12/2020 ?Elsevier Patient Education ? [place]. ? ?

## 2021-09-17 NOTE — Chronic Care Management (AMB) (Signed)
?Chronic Care Management  ? ?CCM RN Visit Note ? ?09/17/2021 ?Name: [name] MRN: [id number] DOB: [birth year] ? ?Subjective: ?[name] is a 87 y.o. year old male who is a primary care patient of Pickard, Cammie Mcgee, MD. The care management team was consulted for assistance with disease management and care coordination needs.   ? ?Engaged with patient by telephone for follow up visit in response to provider referral for case management and/or care coordination services.  ? ?Consent to Services:  ?The patient was given information about Chronic Care Management services, agreed to services, and gave verbal consent prior to initiation of services.  Please see initial visit note for detailed documentation.  ? ?Patient agreed to services and verbal consent obtained.  ? ?Assessment: Review of patient past medical history, allergies, medications, health status, including review of consultants reports, laboratory and other test data, was performed as part of comprehensive evaluation and provision of chronic care management services.  ? ?SDOH (Social Determinants of Health) assessments and interventions performed:   ? ?CCM Care Plan ? ?Allergies  ?Allergen Reactions  ? Celebrex [Celecoxib] Rash and Other (See Comments)  ?  Blister  ? ? ?Outpatient Encounter Medications as of 09/17/2021  ?Medication Sig  ? albuterol (PROVENTIL) (2.5 MG/3ML) 0.083% nebulizer solution Take 3 mLs (2.5 mg total) by nebulization every 6 (six) hours as needed for wheezing or shortness of breath.  ? aspirin 325 MG tablet Take 325 mg by mouth daily.  ? blood glucose meter kit and supplies Dispense based on patient and insurance preference. Use up to four times daily as directed. (FOR ICD-10 E10.9, E11.9).  ? clobetasol cream (TEMOVATE) 0.05 % Please specify directions, refills and quantity  ? clotrimazole (LOTRIMIN) 1 % cream Apply 1 application topically 2 (two) times daily. Apply to the head of the penis  ? glucose blood (ONETOUCH ULTRA)  test strip USE TO CHECK BLOOD SUGAR 4 TO 6 TIMES DAILY. E11.9  ? HYDROcodone-acetaminophen (NORCO) 5-325 MG tablet Take 1 tablet by mouth every 6 (six) hours as needed for moderate pain.  ? hydrOXYzine (ATARAX) 25 MG tablet TAKE 1 TABLET BY MOUTH 3 TIMES DAILY AS NEEDED FOR ITCHING.  ? Insulin Syringe-Needle U-100 (BD INSULIN SYRINGE U/F) 31G X 5/16" 0.3 ML MISC USE WITH NOVOLIN TWICE DAY WITH A MEAL  ? nitroGLYCERIN (NITROSTAT) 0.4 MG SL tablet Place 0.4 mg under the tongue every 5 (five) minutes as needed for chest pain.  ? NOVOLIN 70/30 (70-30) 100 UNIT/ML injection INJECT 8 UNITS SQ IN THE MORNING AND INJECT 12 UNITS SQ AT BEDTIME  ? OneTouch Delica Lancets 83F MISC USE TO CHECK BLOOD SUAGR 3 TIMES A DAY DX:E11.9  ? pantoprazole (PROTONIX) 40 MG tablet TAKE 1 TABLET BY MOUTH EVERY DAY  ? pravastatin (PRAVACHOL) 40 MG tablet Take 1 tablet (40 mg total) by mouth daily.  ? tamsulosin (FLOMAX) 0.4 MG CAPS capsule Take 1 capsule (0.4 mg total) by mouth daily.  ? triamcinolone cream (KENALOG) 0.1 % APPLY TO AFFECTED AREA TWICE A DAY  ? ?No facility-administered encounter medications on file as of 09/17/2021.  ? ? ?Patient Active Problem List  ? Diagnosis Date Noted  ? Nocturnal hypoxemia 09/05/2020  ? COPD GOLD 0 still smoking  06/07/2018  ? Pseudomonas pneumonia ([place]) 02/02/2018  ? Adult BMI <19 kg/sq m 02/02/2018  ? Pulmonary infiltrates on CXR 01/08/2018  ? Depression 11/25/2017  ? DKA (diabetic ketoacidosis) ([place]) 07/29/2017  ? Lung nodule 01/13/2015  ? Food impaction of  esophagus 12/06/2013  ? Dysphagia 12/06/2013  ? Cerebrovascular disease 11/14/2011  ? Fatigue 01/08/2011  ? Bruit 01/08/2011  ? Cigarette smoker 01/11/2010  ? ABDOMINAL BRUIT 01/11/2010  ? DYSLIPIDEMIA 11/19/2008  ? Essential hypertension 11/19/2008  ? Atrial flutter ([place]) 11/19/2008  ? DVT 11/19/2008  ? ? ?Conditions to be addressed/monitored:COPD and DMII ? ?Care Plan : RN Care Manager Plan of Care  ?Updates made by Kassie Mends, RN since  09/17/2021 12:00 AM  ?  ? ?Problem: No plan of care established for management of chronic disease states  (COPD, DM2)   ?Priority: High  ?  ? ?Long-Range Goal: Development of plan of care for chronic disease management (COPD, DM2)   ?Start Date: 06/08/2021  ?Expected End Date: 12/05/2021  ?Priority: High  ?Note:   ?Current Barriers:  ?Knowledge Deficits related to plan of care for management of COPD and DMII  ?Knowledge Deficits related to basic Diabetes pathophysiology and self care/management-  Pt lives alone, is independent with ADL, IADL's, continues to drive, 2 adult sons live 5 minutes away, patient continues to smoke 1 pack per day and not interested in quitting at present.  Patient does not adhere to any special diet, eats out alot for companionship and to get out of the house.  Patient checks CBG BID and granddaughter unsure of the readings, granddaughter reports has all medications and taking as prescribed. ?Does not adhere to provider recommendations re: eats a regular diet ?Knowledge deficits related to basic understanding of COPD disease process- patient/ caregivers can benefit from reinforcement / management of COPD, medications. ?Knowledge deficits related to basic COPD self care/management- needs teaching/ reinforcement COPD action plan ?Granddaughter [name] reports pt recently finished antibiotic for pneumonia, has "spot in lung that doctor feels like may be causing issues" states pt is not having biopsy done due to his age he decided against.  Patient does not have pulse oximeter and does not monitor 02 saturation. ? ?RNCM Clinical Goal(s):  ?Patient will verbalize understanding of plan for management of COPD and DMII as evidenced by patient report, review of EHR and  through collaboration with RN Care manager, provider, and care team.  ? ?Interventions: ?1:1 collaboration with primary care provider regarding development and update of comprehensive plan of care as evidenced by provider attestation  and co-signature ?Inter-disciplinary care team collaboration (see longitudinal plan of care) ?Evaluation of current treatment plan related to  self management and patient's adherence to plan as established by provider ? ? ?COPD Interventions:  (Status:  Goal on track:  Yes.) Long Term Goal ?Advised patient to self assesses COPD action plan zone and make appointment with provider if in the yellow zone for 48 hours without improvement ?Provided education about and advised patient to utilize infection prevention strategies to reduce risk of respiratory infection ?Discussed the importance of adequate rest and management of fatigue with COPD ?Encouraged smoking cessation ?Reviewed medications and importance of taking as prescribed ?Reviewed signs/ symptoms infection ?Encouraged caregiver to obtain pulse oximeter and start checking 02 saturations daily ?Reviewed importance of using nebulizer medication as prescribed  ? ? ?Diabetes Interventions:  (Status:  Goal on track:  Yes.) Long Term Goal ?Assessed patient's understanding of A1c goal: <7% ?Reviewed medications with patient and discussed importance of medication adherence ?Review of patient status, including review of consultants reports, relevant laboratory and other test results, and medications completed ?Reinforced carbohydrate modified diet ?Lab Results  ?Component Value Date  ? HGBA1C 7.7 (H) 01/29/2021  ?  ?Patient Goals/Self-Care Activities: ?  Take medications as prescribed   ?Attend all scheduled provider appointments ?Call provider office for new concerns or questions  ?check blood sugar at prescribed times: twice daily ?enter blood sugar readings and medication or insulin into daily log ?take the blood sugar log to all doctor visits ?take the blood sugar meter to all doctor visits ?trim toenails straight across ?read food labels for fat, fiber, carbohydrates and portion size ?wear comfortable, cotton socks ?wear comfortable, well-fitting shoes ?eliminate  smoking in my home ?limit outdoor activity during cold weather ?follow rescue plan if symptoms flare-up ?do breathing exercises every day ?Practice good handwashing, wear a mask as needed- avoid sick people ?Fol

## 2021-09-20 ENCOUNTER — Other Ambulatory Visit (HOSPITAL_COMMUNITY): Payer: Self-pay | Admitting: Cardiology

## 2021-09-20 DIAGNOSIS — I6523 Occlusion and stenosis of bilateral carotid arteries: Secondary | ICD-10-CM

## 2021-09-21 DIAGNOSIS — Z794 Long term (current) use of insulin: Secondary | ICD-10-CM | POA: Diagnosis not present

## 2021-09-21 DIAGNOSIS — F17210 Nicotine dependence, cigarettes, uncomplicated: Secondary | ICD-10-CM | POA: Diagnosis not present

## 2021-09-21 DIAGNOSIS — E1140 Type 2 diabetes mellitus with diabetic neuropathy, unspecified: Secondary | ICD-10-CM | POA: Diagnosis not present

## 2021-09-21 DIAGNOSIS — J449 Chronic obstructive pulmonary disease, unspecified: Secondary | ICD-10-CM

## 2021-09-21 DIAGNOSIS — R69 Illness, unspecified: Secondary | ICD-10-CM | POA: Diagnosis not present

## 2021-09-25 DIAGNOSIS — J449 Chronic obstructive pulmonary disease, unspecified: Secondary | ICD-10-CM | POA: Diagnosis not present

## 2021-10-02 ENCOUNTER — Encounter (HOSPITAL_COMMUNITY): Payer: Self-pay | Admitting: Emergency Medicine

## 2021-10-02 ENCOUNTER — Other Ambulatory Visit: Payer: Self-pay

## 2021-10-02 ENCOUNTER — Emergency Department (HOSPITAL_COMMUNITY): Payer: Medicare HMO

## 2021-10-02 ENCOUNTER — Inpatient Hospital Stay (HOSPITAL_COMMUNITY)
Admission: EM | Admit: 2021-10-02 | Discharge: 2021-10-08 | DRG: 193 | Disposition: A | Discharge status: Home Health | Payer: Medicare HMO | Attending: Internal Medicine | Admitting: Internal Medicine

## 2021-10-02 DIAGNOSIS — R0600 Dyspnea, unspecified: Secondary | ICD-10-CM | POA: Diagnosis not present

## 2021-10-02 DIAGNOSIS — J9601 Acute respiratory failure with hypoxia: Secondary | ICD-10-CM | POA: Diagnosis present

## 2021-10-02 DIAGNOSIS — Z91048 Other nonmedicinal substance allergy status: Secondary | ICD-10-CM

## 2021-10-02 DIAGNOSIS — Z20822 Contact with and (suspected) exposure to covid-19: Secondary | ICD-10-CM | POA: Diagnosis present

## 2021-10-02 DIAGNOSIS — J440 Chronic obstructive pulmonary disease with acute lower respiratory infection: Secondary | ICD-10-CM | POA: Diagnosis present

## 2021-10-02 DIAGNOSIS — I129 Hypertensive chronic kidney disease with stage 1 through stage 4 chronic kidney disease, or unspecified chronic kidney disease: Secondary | ICD-10-CM | POA: Diagnosis present

## 2021-10-02 DIAGNOSIS — Z79899 Other long term (current) drug therapy: Secondary | ICD-10-CM

## 2021-10-02 DIAGNOSIS — N183 Chronic kidney disease, stage 3 unspecified: Secondary | ICD-10-CM

## 2021-10-02 DIAGNOSIS — Z681 Body mass index (BMI) 19 or less, adult: Secondary | ICD-10-CM

## 2021-10-02 DIAGNOSIS — R54 Age-related physical debility: Secondary | ICD-10-CM | POA: Diagnosis present

## 2021-10-02 DIAGNOSIS — I10 Essential (primary) hypertension: Secondary | ICD-10-CM | POA: Diagnosis present

## 2021-10-02 DIAGNOSIS — J189 Pneumonia, unspecified organism: Principal | ICD-10-CM | POA: Diagnosis present

## 2021-10-02 DIAGNOSIS — J449 Chronic obstructive pulmonary disease, unspecified: Secondary | ICD-10-CM | POA: Diagnosis present

## 2021-10-02 DIAGNOSIS — R911 Solitary pulmonary nodule: Secondary | ICD-10-CM | POA: Diagnosis present

## 2021-10-02 DIAGNOSIS — Z7982 Long term (current) use of aspirin: Secondary | ICD-10-CM

## 2021-10-02 DIAGNOSIS — N400 Enlarged prostate without lower urinary tract symptoms: Secondary | ICD-10-CM | POA: Diagnosis present

## 2021-10-02 DIAGNOSIS — Z743 Need for continuous supervision: Secondary | ICD-10-CM | POA: Diagnosis not present

## 2021-10-02 DIAGNOSIS — F17210 Nicotine dependence, cigarettes, uncomplicated: Secondary | ICD-10-CM | POA: Diagnosis present

## 2021-10-02 DIAGNOSIS — J9819 Other pulmonary collapse: Secondary | ICD-10-CM | POA: Diagnosis present

## 2021-10-02 DIAGNOSIS — E1122 Type 2 diabetes mellitus with diabetic chronic kidney disease: Secondary | ICD-10-CM | POA: Diagnosis present

## 2021-10-02 DIAGNOSIS — Z888 Allergy status to other drugs, medicaments and biological substances status: Secondary | ICD-10-CM

## 2021-10-02 DIAGNOSIS — E43 Unspecified severe protein-calorie malnutrition: Secondary | ICD-10-CM | POA: Insufficient documentation

## 2021-10-02 DIAGNOSIS — N1832 Chronic kidney disease, stage 3b: Secondary | ICD-10-CM | POA: Diagnosis present

## 2021-10-02 DIAGNOSIS — R64 Cachexia: Secondary | ICD-10-CM | POA: Diagnosis present

## 2021-10-02 DIAGNOSIS — I4892 Unspecified atrial flutter: Secondary | ICD-10-CM | POA: Diagnosis present

## 2021-10-02 DIAGNOSIS — Z66 Do not resuscitate: Secondary | ICD-10-CM | POA: Diagnosis present

## 2021-10-02 DIAGNOSIS — Z9981 Dependence on supplemental oxygen: Secondary | ICD-10-CM

## 2021-10-02 DIAGNOSIS — J690 Pneumonitis due to inhalation of food and vomit: Secondary | ICD-10-CM

## 2021-10-02 DIAGNOSIS — Z951 Presence of aortocoronary bypass graft: Secondary | ICD-10-CM

## 2021-10-02 DIAGNOSIS — E1165 Type 2 diabetes mellitus with hyperglycemia: Secondary | ICD-10-CM

## 2021-10-02 DIAGNOSIS — Z794 Long term (current) use of insulin: Secondary | ICD-10-CM

## 2021-10-02 DIAGNOSIS — R079 Chest pain, unspecified: Secondary | ICD-10-CM | POA: Diagnosis not present

## 2021-10-02 DIAGNOSIS — E785 Hyperlipidemia, unspecified: Secondary | ICD-10-CM | POA: Diagnosis present

## 2021-10-02 DIAGNOSIS — R0789 Other chest pain: Secondary | ICD-10-CM | POA: Diagnosis not present

## 2021-10-02 DIAGNOSIS — E119 Type 2 diabetes mellitus without complications: Secondary | ICD-10-CM

## 2021-10-02 DIAGNOSIS — J90 Pleural effusion, not elsewhere classified: Secondary | ICD-10-CM | POA: Diagnosis not present

## 2021-10-02 DIAGNOSIS — Z515 Encounter for palliative care: Secondary | ICD-10-CM

## 2021-10-02 DIAGNOSIS — I2510 Atherosclerotic heart disease of native coronary artery without angina pectoris: Secondary | ICD-10-CM

## 2021-10-02 DIAGNOSIS — R1310 Dysphagia, unspecified: Secondary | ICD-10-CM

## 2021-10-02 DIAGNOSIS — J9621 Acute and chronic respiratory failure with hypoxia: Secondary | ICD-10-CM | POA: Diagnosis present

## 2021-10-02 DIAGNOSIS — K219 Gastro-esophageal reflux disease without esophagitis: Secondary | ICD-10-CM | POA: Diagnosis present

## 2021-10-02 LAB — CBC WITH DIFFERENTIAL/PLATELET
Abs Immature Granulocytes: 0.06 10*3/uL (ref 0.00–0.07)
Basophils Absolute: 0.1 10*3/uL (ref 0.0–0.1)
Basophils Relative: 1 %
Eosinophils Absolute: 0.2 10*3/uL (ref 0.0–0.5)
Eosinophils Relative: 1 %
HCT: 38.8 % — ABNORMAL LOW (ref 39.0–52.0)
Hemoglobin: 12.5 g/dL — ABNORMAL LOW (ref 13.0–17.0)
Immature Granulocytes: 1 %
Lymphocytes Relative: 12 %
Lymphs Abs: 1.5 10*3/uL (ref 0.7–4.0)
MCH: 28.8 pg (ref 26.0–34.0)
MCHC: 32.2 g/dL (ref 30.0–36.0)
MCV: 89.4 fL (ref 80.0–100.0)
Monocytes Absolute: 1.4 10*3/uL — ABNORMAL HIGH (ref 0.1–1.0)
Monocytes Relative: 12 %
Neutro Abs: 9.1 10*3/uL — ABNORMAL HIGH (ref 1.7–7.7)
Neutrophils Relative %: 73 %
Platelets: 250 10*3/uL (ref 150–400)
RBC: 4.34 MIL/uL (ref 4.22–5.81)
RDW: 17.2 % — ABNORMAL HIGH (ref 11.5–15.5)
WBC: 12.4 10*3/uL — ABNORMAL HIGH (ref 4.0–10.5)
nRBC: 0 % (ref 0.0–0.2)

## 2021-10-02 LAB — COMPREHENSIVE METABOLIC PANEL
ALT: 9 U/L (ref 0–44)
AST: 15 U/L (ref 15–41)
Albumin: 3.5 g/dL (ref 3.5–5.0)
Alkaline Phosphatase: 54 U/L (ref 38–126)
Anion gap: 7 (ref 5–15)
BUN: 25 mg/dL — ABNORMAL HIGH (ref 8–23)
CO2: 25 mmol/L (ref 22–32)
Calcium: 9.1 mg/dL (ref 8.9–10.3)
Chloride: 104 mmol/L (ref 98–111)
Creatinine, Ser: 1.43 mg/dL — ABNORMAL HIGH (ref 0.61–1.24)
GFR, Estimated: 48 mL/min — ABNORMAL LOW (ref >60)
Glucose, Bld: 146 mg/dL — ABNORMAL HIGH (ref 70–99)
Potassium: 4.6 mmol/L (ref 3.5–5.1)
Sodium: 136 mmol/L (ref 135–145)
Total Bilirubin: 0.7 mg/dL (ref 0.3–1.2)
Total Protein: 8.1 g/dL (ref 6.5–8.1)

## 2021-10-02 LAB — LACTIC ACID, PLASMA: Lactic Acid, Venous: 1.6 mmol/L (ref 0.5–1.9)

## 2021-10-02 LAB — TROPONIN I (HIGH SENSITIVITY): Troponin I (High Sensitivity): 5 ng/L (ref <18)

## 2021-10-02 NOTE — ED Provider Triage Note (Signed)
Emergency Medicine Provider Triage Evaluation Note ? ?[name] , a 87 y.o. male  was evaluated in triage.  Pt complains of ongoing cough and chest pain.   ? ?He has been seen and treated with multiple rounds of antibiotics for pneumonia.  He states that he has pain on the left side of his chest.  He is short of breath. ? ? ?Physical Exam  ?BP 122/62 (BP Location: Right Arm)   Pulse 84   Temp 98.6 ?F (37 ?C) (Oral)   Resp 18   SpO2 94%  ?Gen:   Awake, no distress   ?Resp:  Normal effort  ?MSK:   Moves extremities without difficulty  ?Other:  Rhonchi bilaterally  ? ?Medical Decision Making  ?Medically screening exam initiated at 8:23 PM.  Appropriate orders placed.  [name] was informed that the remainder of the evaluation will be completed by another provider, this initial triage assessment does not replace that evaluation, and the importance of remaining in the ED until their evaluation is complete. ? ? ?  ?Lorin Glass, PA-C ?10/02/21 2026 ? ?

## 2021-10-02 NOTE — ED Triage Notes (Addendum)
Patient here with chest pain, no shortness of breath.  Patient does have PNA, has been on multiple antibiotics for PNA.  Lungs are clear bilateral, oxygen sat of 97% RA.  Patient has fatigue and weakness.  Patient unsteady on feet.  Pain is 5/10, patient given one SL nitro with no relief and was given 325mg  ASA by EMS dispatch. ?

## 2021-10-03 ENCOUNTER — Emergency Department (HOSPITAL_COMMUNITY): Payer: Medicare HMO

## 2021-10-03 DIAGNOSIS — N183 Chronic kidney disease, stage 3 unspecified: Secondary | ICD-10-CM

## 2021-10-03 DIAGNOSIS — E119 Type 2 diabetes mellitus without complications: Secondary | ICD-10-CM

## 2021-10-03 DIAGNOSIS — E43 Unspecified severe protein-calorie malnutrition: Secondary | ICD-10-CM | POA: Diagnosis not present

## 2021-10-03 DIAGNOSIS — R1310 Dysphagia, unspecified: Secondary | ICD-10-CM | POA: Diagnosis not present

## 2021-10-03 DIAGNOSIS — E1122 Type 2 diabetes mellitus with diabetic chronic kidney disease: Secondary | ICD-10-CM | POA: Diagnosis not present

## 2021-10-03 DIAGNOSIS — K219 Gastro-esophageal reflux disease without esophagitis: Secondary | ICD-10-CM | POA: Diagnosis present

## 2021-10-03 DIAGNOSIS — N1832 Chronic kidney disease, stage 3b: Secondary | ICD-10-CM | POA: Diagnosis present

## 2021-10-03 DIAGNOSIS — R079 Chest pain, unspecified: Secondary | ICD-10-CM | POA: Diagnosis not present

## 2021-10-03 DIAGNOSIS — I2510 Atherosclerotic heart disease of native coronary artery without angina pectoris: Secondary | ICD-10-CM

## 2021-10-03 DIAGNOSIS — I129 Hypertensive chronic kidney disease with stage 1 through stage 4 chronic kidney disease, or unspecified chronic kidney disease: Secondary | ICD-10-CM | POA: Diagnosis present

## 2021-10-03 DIAGNOSIS — J690 Pneumonitis due to inhalation of food and vomit: Secondary | ICD-10-CM

## 2021-10-03 DIAGNOSIS — Z20822 Contact with and (suspected) exposure to covid-19: Secondary | ICD-10-CM | POA: Diagnosis not present

## 2021-10-03 DIAGNOSIS — E785 Hyperlipidemia, unspecified: Secondary | ICD-10-CM | POA: Diagnosis present

## 2021-10-03 DIAGNOSIS — I4892 Unspecified atrial flutter: Secondary | ICD-10-CM

## 2021-10-03 DIAGNOSIS — R69 Illness, unspecified: Secondary | ICD-10-CM | POA: Diagnosis not present

## 2021-10-03 DIAGNOSIS — J449 Chronic obstructive pulmonary disease, unspecified: Secondary | ICD-10-CM | POA: Diagnosis not present

## 2021-10-03 DIAGNOSIS — F17210 Nicotine dependence, cigarettes, uncomplicated: Secondary | ICD-10-CM

## 2021-10-03 DIAGNOSIS — J9601 Acute respiratory failure with hypoxia: Secondary | ICD-10-CM | POA: Diagnosis not present

## 2021-10-03 DIAGNOSIS — R64 Cachexia: Secondary | ICD-10-CM | POA: Diagnosis not present

## 2021-10-03 DIAGNOSIS — E1165 Type 2 diabetes mellitus with hyperglycemia: Secondary | ICD-10-CM

## 2021-10-03 DIAGNOSIS — I10 Essential (primary) hypertension: Secondary | ICD-10-CM | POA: Diagnosis not present

## 2021-10-03 DIAGNOSIS — R0902 Hypoxemia: Secondary | ICD-10-CM | POA: Diagnosis not present

## 2021-10-03 DIAGNOSIS — R54 Age-related physical debility: Secondary | ICD-10-CM | POA: Diagnosis present

## 2021-10-03 DIAGNOSIS — J189 Pneumonia, unspecified organism: Secondary | ICD-10-CM | POA: Diagnosis not present

## 2021-10-03 DIAGNOSIS — J440 Chronic obstructive pulmonary disease with acute lower respiratory infection: Secondary | ICD-10-CM | POA: Diagnosis not present

## 2021-10-03 DIAGNOSIS — Z794 Long term (current) use of insulin: Secondary | ICD-10-CM

## 2021-10-03 DIAGNOSIS — Z9981 Dependence on supplemental oxygen: Secondary | ICD-10-CM | POA: Diagnosis not present

## 2021-10-03 DIAGNOSIS — Z681 Body mass index (BMI) 19 or less, adult: Secondary | ICD-10-CM | POA: Diagnosis not present

## 2021-10-03 DIAGNOSIS — J9621 Acute and chronic respiratory failure with hypoxia: Secondary | ICD-10-CM | POA: Diagnosis present

## 2021-10-03 DIAGNOSIS — Z888 Allergy status to other drugs, medicaments and biological substances status: Secondary | ICD-10-CM | POA: Diagnosis not present

## 2021-10-03 DIAGNOSIS — Z515 Encounter for palliative care: Secondary | ICD-10-CM | POA: Diagnosis not present

## 2021-10-03 DIAGNOSIS — Z91048 Other nonmedicinal substance allergy status: Secondary | ICD-10-CM | POA: Diagnosis not present

## 2021-10-03 DIAGNOSIS — R911 Solitary pulmonary nodule: Secondary | ICD-10-CM

## 2021-10-03 DIAGNOSIS — N19 Unspecified kidney failure: Secondary | ICD-10-CM | POA: Diagnosis not present

## 2021-10-03 DIAGNOSIS — N400 Enlarged prostate without lower urinary tract symptoms: Secondary | ICD-10-CM | POA: Diagnosis present

## 2021-10-03 DIAGNOSIS — Z951 Presence of aortocoronary bypass graft: Secondary | ICD-10-CM | POA: Diagnosis not present

## 2021-10-03 DIAGNOSIS — Z66 Do not resuscitate: Secondary | ICD-10-CM | POA: Diagnosis not present

## 2021-10-03 DIAGNOSIS — J9811 Atelectasis: Secondary | ICD-10-CM | POA: Diagnosis not present

## 2021-10-03 DIAGNOSIS — J9819 Other pulmonary collapse: Secondary | ICD-10-CM | POA: Diagnosis present

## 2021-10-03 LAB — MRSA NEXT GEN BY PCR, NASAL: MRSA by PCR Next Gen: NOT DETECTED

## 2021-10-03 LAB — RESP PANEL BY RT-PCR (FLU A&B, COVID) ARPGX2
Influenza A by PCR: NEGATIVE
Influenza B by PCR: NEGATIVE
SARS Coronavirus 2 by RT PCR: NEGATIVE

## 2021-10-03 LAB — CBG MONITORING, ED
Glucose-Capillary: 231 mg/dL — ABNORMAL HIGH (ref 70–99)
Glucose-Capillary: 196 mg/dL — ABNORMAL HIGH (ref 70–99)
Glucose-Capillary: 253 mg/dL — ABNORMAL HIGH (ref 70–99)

## 2021-10-03 LAB — LACTIC ACID, PLASMA: Lactic Acid, Venous: 1.6 mmol/L (ref 0.5–1.9)

## 2021-10-03 LAB — BRAIN NATRIURETIC PEPTIDE: B Natriuretic Peptide: 57.6 pg/mL (ref 0.0–100.0)

## 2021-10-03 LAB — TROPONIN I (HIGH SENSITIVITY): Troponin I (High Sensitivity): 6 ng/L (ref <18)

## 2021-10-03 MED ORDER — INSULIN GLARGINE-YFGN 100 UNIT/ML ~~LOC~~ SOLN
8.0000 [IU] | Freq: Every day | SUBCUTANEOUS | Status: DC
  Administered 2021-10-04 – 2021-10-05 (×3): 8 [IU] via SUBCUTANEOUS
  Filled 2021-10-03 (×4): qty 0.08

## 2021-10-03 MED ORDER — SODIUM CHLORIDE 0.9 % IV SOLN
1.0000 g | Freq: Once | INTRAVENOUS | Status: AC
  Administered 2021-10-03: 1 g via INTRAVENOUS
  Filled 2021-10-03: qty 10

## 2021-10-03 MED ORDER — GUAIFENESIN 100 MG/5ML PO LIQD
5.0000 mL | ORAL | Status: DC | PRN
  Administered 2021-10-03: 5 mL via ORAL
  Filled 2021-10-03: qty 5

## 2021-10-03 MED ORDER — ONDANSETRON HCL 4 MG PO TABS: 4.0000 mg | ORAL_TABLET | Freq: Four times a day (QID) | ORAL | Status: DC | PRN

## 2021-10-03 MED ORDER — NITROGLYCERIN 0.4 MG SL SUBL: 0.4000 mg | SUBLINGUAL_TABLET | SUBLINGUAL | Status: DC | PRN

## 2021-10-03 MED ORDER — TAMSULOSIN HCL 0.4 MG PO CAPS
0.4000 mg | ORAL_CAPSULE | Freq: Every day | ORAL | Status: DC
  Administered 2021-10-04 – 2021-10-08 (×5): 0.4 mg via ORAL
  Filled 2021-10-03 (×5): qty 1

## 2021-10-03 MED ORDER — INSULIN ASPART 100 UNIT/ML IJ SOLN
0.0000 [IU] | Freq: Three times a day (TID) | INTRAMUSCULAR | Status: DC
  Administered 2021-10-03: 1 [IU] via SUBCUTANEOUS
  Administered 2021-10-04: 3 [IU] via SUBCUTANEOUS
  Administered 2021-10-05: 2 [IU] via SUBCUTANEOUS
  Administered 2021-10-05: 1 [IU] via SUBCUTANEOUS
  Administered 2021-10-06 (×2): 3 [IU] via SUBCUTANEOUS
  Administered 2021-10-07: 1 [IU] via SUBCUTANEOUS
  Administered 2021-10-07: 3 [IU] via SUBCUTANEOUS

## 2021-10-03 MED ORDER — ACETAMINOPHEN 650 MG RE SUPP: 650.0000 mg | Freq: Four times a day (QID) | RECTAL | Status: DC | PRN

## 2021-10-03 MED ORDER — ASPIRIN 325 MG PO TABS
325.0000 mg | ORAL_TABLET | Freq: Every day | ORAL | Status: DC
  Administered 2021-10-04 – 2021-10-05 (×2): 325 mg via ORAL
  Filled 2021-10-03 (×2): qty 1

## 2021-10-03 MED ORDER — ENOXAPARIN SODIUM 40 MG/0.4ML IJ SOSY
40.0000 mg | PREFILLED_SYRINGE | Freq: Every day | INTRAMUSCULAR | Status: DC
  Administered 2021-10-03 – 2021-10-07 (×5): 40 mg via SUBCUTANEOUS
  Filled 2021-10-03 (×5): qty 0.4

## 2021-10-03 MED ORDER — VANCOMYCIN HCL 1250 MG/250ML IV SOLN
1250.0000 mg | Freq: Once | INTRAVENOUS | Status: AC
  Administered 2021-10-03: 1250 mg via INTRAVENOUS
  Filled 2021-10-03: qty 250

## 2021-10-03 MED ORDER — ALBUTEROL SULFATE (2.5 MG/3ML) 0.083% IN NEBU: 2.5000 mg | INHALATION_SOLUTION | Freq: Four times a day (QID) | RESPIRATORY_TRACT | Status: DC | PRN

## 2021-10-03 MED ORDER — ONDANSETRON HCL 4 MG/2ML IJ SOLN: 4.0000 mg | Freq: Four times a day (QID) | INTRAMUSCULAR | Status: DC | PRN

## 2021-10-03 MED ORDER — SODIUM CHLORIDE 0.9 % IV BOLUS
500.0000 mL | Freq: Once | INTRAVENOUS | Status: AC
  Administered 2021-10-03: 500 mL via INTRAVENOUS

## 2021-10-03 MED ORDER — CEFEPIME HCL 2 G IJ SOLR
2.0000 g | Freq: Two times a day (BID) | INTRAMUSCULAR | Status: DC
  Administered 2021-10-03 – 2021-10-04 (×2): 2 g via INTRAVENOUS
  Filled 2021-10-03 (×2): qty 12.5

## 2021-10-03 MED ORDER — IPRATROPIUM-ALBUTEROL 0.5-2.5 (3) MG/3ML IN SOLN
3.0000 mL | RESPIRATORY_TRACT | Status: DC
  Administered 2021-10-03 – 2021-10-04 (×5): 3 mL via RESPIRATORY_TRACT
  Filled 2021-10-03 (×6): qty 3

## 2021-10-03 MED ORDER — HYDROCODONE-ACETAMINOPHEN 5-325 MG PO TABS
1.0000 | ORAL_TABLET | Freq: Four times a day (QID) | ORAL | Status: DC | PRN
  Administered 2021-10-03: 1 via ORAL
  Filled 2021-10-03: qty 1

## 2021-10-03 MED ORDER — IOHEXOL 350 MG/ML SOLN
80.0000 mL | Freq: Once | INTRAVENOUS | Status: AC | PRN
  Administered 2021-10-03: 80 mL via INTRAVENOUS

## 2021-10-03 MED ORDER — PRAVASTATIN SODIUM 40 MG PO TABS
40.0000 mg | ORAL_TABLET | Freq: Every day | ORAL | Status: DC
  Administered 2021-10-03 – 2021-10-07 (×5): 40 mg via ORAL
  Filled 2021-10-03 (×5): qty 1

## 2021-10-03 MED ORDER — METRONIDAZOLE 500 MG/100ML IV SOLN
500.0000 mg | Freq: Three times a day (TID) | INTRAVENOUS | Status: DC
  Administered 2021-10-03 – 2021-10-04 (×3): 500 mg via INTRAVENOUS
  Filled 2021-10-03 (×3): qty 100

## 2021-10-03 MED ORDER — ACETAMINOPHEN 325 MG PO TABS: 650.0000 mg | ORAL_TABLET | Freq: Four times a day (QID) | ORAL | Status: DC | PRN

## 2021-10-03 MED ORDER — VANCOMYCIN HCL 750 MG/150ML IV SOLN: 750.0000 mg | INTRAVENOUS | Status: DC

## 2021-10-03 MED ORDER — SODIUM CHLORIDE 0.9 % IV SOLN
500.0000 mg | Freq: Once | INTRAVENOUS | Status: AC
  Administered 2021-10-03: 500 mg via INTRAVENOUS
  Filled 2021-10-03: qty 5

## 2021-10-03 MED ORDER — HYDROXYZINE HCL 25 MG PO TABS: 25.0000 mg | ORAL_TABLET | Freq: Three times a day (TID) | ORAL | Status: DC | PRN

## 2021-10-03 NOTE — Evaluation (Signed)
Clinical/Bedside Swallow Evaluation ?Patient Details  ?Name: [name] ?MRN: [id number] ?Date of Birth: Jul 11, 1934 ? ?Today's Date: 10/03/2021 ?Time: SLP Start Time (ACUTE ONLY): 0076 SLP Stop Time (ACUTE ONLY): 1556 ?SLP Time Calculation (min) (ACUTE ONLY): 12 min ? ?Past Medical History:  ?Past Medical History:  ?Diagnosis Date  ? Acute venous embolism and thrombosis of unspecified deep vessels of lower extremity   ? Atrial flutter ([place])   ? and SVT that is post ablation  ? CAD (coronary artery disease)   ? Chronic kidney disease   ? stones  ? Coronary atherosclerosis 11/19/2008  ? Qualifier: Diagnosis of  By: Stanford Breed, MD, Kandyce Rud   ? Dyslipidemia   ? GERD (gastroesophageal reflux disease)   ? HTN (hypertension)   ? Shortness of breath   ? on exertion  ? ?Past Surgical History:  ?Past Surgical History:  ?Procedure Laterality Date  ? CARDIAC CATHETERIZATION    ? s/p cardiac catheterization and bypass surgery as well as EGD with dilation x3, left lower extremity vein stripping  ? CORONARY ARTERY BYPASS GRAFT    ? ESOPHAGOGASTRODUODENOSCOPY N/A 12/06/2013  ? Procedure: ESOPHAGOGASTRODUODENOSCOPY (EGD);  Surgeon: Lear Ng, MD;  Location: [facility];  Service: Endoscopy;  Laterality: N/A;  ? left knee anthroscopy    ? SECONDARY CLOSURE ARM    ? VIDEO BRONCHOSCOPY Bilateral 01/14/2018  ? Procedure: VIDEO BRONCHOSCOPY WITHOUT FLUORO;  Surgeon: Tanda Rockers, MD;  Location: [facility];  Service: Cardiopulmonary;  Laterality: Bilateral;  ? ?HPI:  ?Pt is an [name] who presented to the ED with chest pain and shortness of breath. CT chest: Mucoid-appearing impaction of the bronchus intermedius resulting in complete collapse of the right middle lobe and postobstructive  pneumonia of the posterior basal segment of the right lower lobe,  significantly progressed from comparison 08/02/21. PMH: recurrent pneumonia and hypermetabolic right hilar nodal tissue followed, chronic dysphagia  documented, chronic home O2 at nighttime, COPD, tobacco abuse, CAD, HTN, HLD, A-flutter post ablation, EGD with dilation x3. MBS 09/11/17: mild pharyngeal dysphagia with reduced motor strength and reduced epiglottic inversion leading to laryngeal penetration with thin barium to vocal cords without sensation. Pill noted in distal esophagus and GI follow up recommended; last EGD in chart 2015.; A regular texture diet with thin liquids recommended with use of a chin tuck, liquid wash, intermittent throat clear, and secondary swallows.  ?  ?Assessment / Plan / Recommendation  ?Clinical Impression ? Pt was seen in the ED for bedside swallow evaluation with his granddaughter present. Pt's family reported that the pt coughs across consistencies and occasionally does not masticate foods well. Per the pt and his family, food and pills become "stuck" when he swallows; pt identified the mid chest as the site of the sensation. Pt's granddaughter stated the pt uses the chin tuck posture when he has instances of "sticking", but not otherwise. Oral mechanism exam was [facility] and he presented with full dentures. Pt demonstrated symptoms of pharyngeal dysphagia characterized by inconsistent signs of aspiration across consistencies. A modified barium swallow study is recommended to further assess swallow function. Until the study is completed, pt's current diet will be continued with observance of the outlined swallowing precautions. ?SLP Visit Diagnosis: Dysphagia, unspecified (R13.10) ?   ?Aspiration Risk ? Mild aspiration risk;Moderate aspiration risk  ?  ?Diet Recommendation Regular;Thin liquid (continue current diet)  ? ?Liquid Administration via: Cup;Straw (small sips; avoid consecutive swallows) ?Medication Administration: Whole meds with liquid (or with puree as tolerated) ?  Supervision: Staff to assist with self feeding;Full supervision/cueing for compensatory strategies ?Compensations: Slow rate;Small sips/bites;Follow solids  with liquid ?Postural Changes: Seated upright at 90 degrees  ?  ?Other  Recommendations Oral Care Recommendations: Oral care BID   ? ?Recommendations for follow up therapy are one component of a multi-disciplinary discharge planning process, led by the attending physician.  Recommendations may be updated based on patient status, additional functional criteria and insurance authorization. ? ?Follow up Recommendations  (TBD)  ? ? ?  ?Assistance Recommended at Discharge    ?Functional Status Assessment Patient has not had a recent decline in their functional status  ?Frequency and Duration min 2x/week  ?2 weeks ?  ?   ? ?Prognosis Prognosis for Safe Diet Advancement: Fair ?Barriers to Reach Goals: Cognitive deficits;Time post onset;Severity of deficits  ? ?  ? ?Swallow Study   ?General Date of Onset: 10/02/21 ?HPI: Pt is an [name] who presented to the ED with chest pain and shortness of breath. CT chest: Mucoid-appearing impaction of the bronchus intermedius resulting in complete collapse of the right middle lobe and postobstructive  pneumonia of the posterior basal segment of the right lower lobe,  significantly progressed from comparison 08/02/21. PMH: recurrent pneumonia and hypermetabolic right hilar nodal tissue followed, chronic dysphagia documented, chronic home O2 at nighttime, COPD, tobacco abuse, CAD, HTN, HLD, A-flutter post ablation, EGD with dilation x3. MBS 09/11/17: mild pharyngeal dysphagia with reduced motor strength and reduced epiglottic inversion leading to laryngeal penetration with thin barium to vocal cords without sensation. Pill noted in distal esophagus and GI follow up recommended; last EGD in chart 2015.; A regular texture diet with thin liquids recommended with use of a chin tuck, liquid wash, intermittent throat clear, and secondary swallows. ?Type of Study: Bedside Swallow Evaluation ?Previous Swallow Assessment: See HPI ?Diet Prior to this Study: Regular;Thin liquids ?Temperature  Spikes Noted: No ?Respiratory Status: Room air ?History of Recent Intubation: No ?Behavior/Cognition: Cooperative;Alert;Pleasant mood ?Oral Cavity Assessment: Within Functional Limits ?Oral Care Completed by SLP: No ?Oral Cavity - Dentition: Dentures, top;Dentures, bottom ?Vision: Functional for self-feeding ?Self-Feeding Abilities: Needs assist ?Patient Positioning: Upright in bed;Postural control adequate for testing ?Baseline Vocal Quality: Normal ?Volitional Cough: Congested ?Volitional Swallow: Able to elicit  ?  ?Oral/Motor/Sensory Function Overall Oral Motor/Sensory Function: Within functional limits   ?Ice Chips Ice chips: Impaired ?Presentation: Spoon ?Pharyngeal Phase Impairments: Cough - Immediate   ?Thin Liquid Thin Liquid: Impaired ?Presentation: Cup;Straw ?Pharyngeal  Phase Impairments: Cough - Immediate;Cough - Delayed  ?  ?Nectar Thick Nectar Thick Liquid: Not tested   ?Honey Thick Honey Thick Liquid: Not tested   ?Puree Puree: Impaired ?Presentation: Spoon ?Pharyngeal Phase Impairments: Cough - Immediate;Cough - Delayed   ?Solid ? ? ?  Solid: Impaired ?Presentation: Self Fed ?Pharyngeal Phase Impairments: Throat Clearing - Delayed  ? ?  ?Karry Causer I. Hardin Negus, [place], CCC-SLP ?Acute Rehabilitation Services ?Office number [phone] ?Pager [phone] ? ?[name] ?10/03/2021,4:14 PM ? ? ? ?  ?

## 2021-10-03 NOTE — H&P (Signed)
?History and Physical  ? ? ?History and Physical  ? ? ?[name] OIN:[id number] DOB: [birth year] DOA: 10/02/2021 ? ?PCP: Susy Frizzle, MD ?Patient coming from: home ? ?Chief Complaint: chest pain and shortness of breath ? ?[name] is a 87 y.o. male with medical history significant of recurrent pneumonia and hypermetabolic RIGHT hilar nodal tissue followed by pulmonologist Dr. Christinia Gully, chronic dysphagia documented mild silent aspiration by [facility] 08/29/17 and not able to swallow barium tablet, chronic home O2 at nighttime, COPD, tobacco abuse, CAD, HTN, HLD, A-flutter post ablation who presented with chest pain and shortness of breath. ? ?Patient reports a two- week duration of progressively worsening of shortness of breath, dyspnea exertion, productive cough with yellowish thick sputum, wheezing and associated chest tightness when coughing.  He reports subjective fevers and chills but did not check his temperature.  Patient uses his home inhaler and home nebulizer treatment with minimal improvement.  He has chronic O2 at nighttime, but has to use oxygen supplement 24/7 now.  Patient came to the ED for further evaluation. ? ?In the emergency room, he was afebrile with pulse 80s, RR 20, BP 122/62 and O2 sats 94% on 3 LNC.  The labs showed WBC 12.4, lactic acid 1.6, BUN 25, creatinine 1.43.  CTPA showed Mucoid-appearing impaction of the bronchus intermedius resulting ?in complete collapse of the RML and postobstructive ?pneumonia of  RLL. Unchanged right upper lobe spiculated solid pulmonary nodule ?measuring up to 1.5 cm, again concerning for primary bronchogenic ?neoplasm. ? ?Denies palpitation, nausea, vomiting, diarrhea, abdominal pain, dysuria, urinary frequency or urgency. ? ? ? ?Review of Systems: As per HPI otherwise 10 point review of systems negative.  ?Review of Systems ?Otherwise negative except as per HPI, including: ?General: Denies fever, chills, night sweats or unintended weight  loss. ?Resp: Positive for cough, wheezing, shortness of breath.  Positive for chest tightness when coughing ?Cardiac: Denies palpitations, orthopnea, paroxysmal nocturnal dyspnea. ?GI: Denies abdominal pain, nausea, vomiting, diarrhea or constipation ?GU: Denies dysuria, frequency, hesitancy or incontinence ?MS: Denies muscle aches, joint pain or swelling ?Neuro: Denies headache, neurologic deficits (focal weakness, numbness, tingling), abnormal gait ?Psych: Denies anxiety, depression, SI/HI/AVH ?Skin: Denies new rashes or lesions ?ID: Denies sick contacts, exotic exposures, travel ? ?Past Medical History:  ?Diagnosis Date  ? Acute venous embolism and thrombosis of unspecified deep vessels of lower extremity   ? Atrial flutter ([place])   ? and SVT that is post ablation  ? CAD (coronary artery disease)   ? Chronic kidney disease   ? stones  ? Coronary atherosclerosis 11/19/2008  ? Qualifier: Diagnosis of  By: Stanford Breed, MD, Kandyce Rud   ? Dyslipidemia   ? GERD (gastroesophageal reflux disease)   ? HTN (hypertension)   ? Shortness of breath   ? on exertion  ? ? ?Past Surgical History:  ?Procedure Laterality Date  ? CARDIAC CATHETERIZATION    ? s/p cardiac catheterization and bypass surgery as well as EGD with dilation x3, left lower extremity vein stripping  ? CORONARY ARTERY BYPASS GRAFT    ? ESOPHAGOGASTRODUODENOSCOPY N/A 12/06/2013  ? Procedure: ESOPHAGOGASTRODUODENOSCOPY (EGD);  Surgeon: Lear Ng, MD;  Location: [facility];  Service: Endoscopy;  Laterality: N/A;  ? left knee anthroscopy    ? SECONDARY CLOSURE ARM    ? VIDEO BRONCHOSCOPY Bilateral 01/14/2018  ? Procedure: VIDEO BRONCHOSCOPY WITHOUT FLUORO;  Surgeon: Tanda Rockers, MD;  Location: [facility];  Service: Cardiopulmonary;  Laterality: Bilateral;  ? ? ?SOCIAL  HISTORY: ? reports that he has been smoking cigarettes. He has a 60.00 pack-year smoking history. He has never used smokeless tobacco. He reports that he does not drink alcohol  and does not use drugs. ? ?Allergies  ?Allergen Reactions  ? Celebrex [Celecoxib] Rash and Other (See Comments)  ?  Blisters, also  ? Tape Other (See Comments)  ?  EKG leads leave red marks and PLEASE USE COBAN WRAP!! Skin tears and bruises easily  ? ? ?FAMILY HISTORY: ?History reviewed. No pertinent family history. ? ? ?Prior to Admission medications   ?Medication Sig Start Date End Date Taking? Authorizing Provider  ?acetaminophen (TYLENOL) 325 MG tablet Take 325-650 mg by mouth every 6 (six) hours as needed for mild pain or headache.   Yes [provider]  ?albuterol (PROVENTIL) (2.5 MG/3ML) 0.083% nebulizer solution Take 3 mLs (2.5 mg total) by nebulization every 6 (six) hours as needed for wheezing or shortness of breath. 07/10/21  Yes Susy Frizzle, MD  ?aspirin 325 MG tablet Take 325 mg by mouth daily.   Yes [provider]  ?HYDROcodone-acetaminophen (NORCO) 5-325 MG tablet Take 1 tablet by mouth every 6 (six) hours as needed for moderate pain. 09/13/21  Yes Susy Frizzle, MD  ?hydrOXYzine (ATARAX) 25 MG tablet TAKE 1 TABLET BY MOUTH 3 TIMES DAILY AS NEEDED FOR ITCHING. ?Patient taking differently: Take 25 mg by mouth 3 (three) times daily as needed for itching. 09/13/21  Yes Susy Frizzle, MD  ?nitroGLYCERIN (NITROSTAT) 0.4 MG SL tablet Place 0.4 mg under the tongue every 5 (five) minutes as needed for chest pain. 11/14/11  Yes Lelon Perla, MD  ?NOVOLIN 70/30 (70-30) 100 UNIT/ML injection INJECT 8 UNITS SQ IN THE MORNING AND INJECT 12 UNITS SQ AT BEDTIME ?Patient taking differently: 8-12 Units See admin instructions. Inject 8 units into the skin in the morning and 12 units at bedtime 05/22/21  Yes Susy Frizzle, MD  ?OXYGEN Inhale 4 L/min into the lungs at bedtime.   Yes [provider]  ?pantoprazole (PROTONIX) 40 MG tablet TAKE 1 TABLET BY MOUTH EVERY DAY ?Patient taking differently: Take 40 mg by mouth daily before breakfast. 01/11/21  Yes Susy Frizzle, MD   ?pravastatin (PRAVACHOL) 40 MG tablet Take 1 tablet (40 mg total) by mouth daily. ?Patient taking differently: Take 40 mg by mouth at bedtime. 09/13/21  Yes Susy Frizzle, MD  ?tamsulosin (FLOMAX) 0.4 MG CAPS capsule Take 1 capsule (0.4 mg total) by mouth daily. 01/29/21  Yes Susy Frizzle, MD  ?blood glucose meter kit and supplies Dispense based on patient and insurance preference. Use up to four times daily as directed. (FOR ICD-10 E10.9, E11.9). 08/18/20   Susy Frizzle, MD  ?clobetasol cream (TEMOVATE) 0.05 % Please specify directions, refills and quantity ?Patient not taking: Reported on 10/03/2021 08/27/18   Susy Frizzle, MD  ?clotrimazole (LOTRIMIN) 1 % cream Apply 1 application topically 2 (two) times daily. Apply to the head of the penis 07/03/20   Jenna Luo T, MD  ?glucose blood (ONETOUCH ULTRA) test strip USE TO CHECK BLOOD SUGAR 4 TO 6 TIMES DAILY. E11.9 04/19/21   Susy Frizzle, MD  ?Insulin Syringe-Needle U-100 (BD INSULIN SYRINGE U/F) 31G X 5/16" 0.3 ML MISC USE WITH NOVOLIN TWICE DAY WITH A MEAL 03/15/20   Susy Frizzle, MD  ?OneTouch Delica Lancets 24M MISC USE TO CHECK BLOOD SUAGR 3 TIMES A DAY DX:E11.9 05/15/20   Susy Frizzle, MD  ?  triamcinolone cream (KENALOG) 0.1 % APPLY TO AFFECTED AREA TWICE A DAY ?Patient taking differently: Apply 1 application. topically See admin instructions. APPLY TO AFFECTED AREA TWICE A DAY 01/11/21   Susy Frizzle, MD  ? ? ?Physical Exam: ?Vitals:  ? 10/03/21 0844 10/03/21 1130 10/03/21 1400 10/03/21 1430  ?BP: (!) 115/58 (!) 157/73 (!) 153/80 (!) 148/87  ?Pulse: 81 79 83 83  ?Resp: 16 (!) 25 (!) 22 17  ?Temp:      ?TempSrc:      ?SpO2: 94% 96% 94% 99%  ? ? ? ? ?Constitutional: NAD, calm, comfortable.  Chronic ill-appearing.  Acutely ill-appearing.  Wearing oxygen. ?Eyes: PERRL, lids and conjunctivae normal ?ENMT: Mucous membranes are moist. Posterior pharynx clear of any exudate or lesions.Normal dentition.  ?Neck: normal, supple, no  masses, no thyromegaly ?Respiratory: c bilateral lung sounds diminished.  Right-sided lungs scattered rales, rhonchi wheezing noted.  Normal respiratory effort. No accessory muscle use.  ?Cardiovascular: Regu

## 2021-10-03 NOTE — ED Notes (Signed)
Patient placed in recliner for comfort ?

## 2021-10-03 NOTE — ED Notes (Signed)
Up to b/r, steady gait with family assistance. No changes.  ?

## 2021-10-03 NOTE — ED Notes (Signed)
Report and care received. ABT initiated. !st BC obtained, pending 2nd BCx draw, PBT/lab notified. ?

## 2021-10-03 NOTE — ED Provider Notes (Signed)
?[place] ?Provider Note ? ? ?CSN: [id number] ?Arrival date & time: 10/02/21  2008 ? ?  ? ?History ? ?Chief Complaint  ?Patient presents with  ? Chest Pain  ? ? ?[name] is a 87 y.o. male. ? ?Patient with history of coronary artery disease status post CABG, stenting, history of mild COPD, known right upper lobe lung nodule, recurrent pneumonia, home oxygen use at night -- presents to the emergency department for evaluation of chest pain and shortness of breath.  Patient's granddaughter at bedside states that patient called yesterday complaining of significant chest pressure on the left side.  She states that he was crying and was very uncomfortable and that is unlike him.  They came to the emergency department for evaluation.  They report recent worsening shortness of breath with exertion, baseline chronic cough, no fevers.  Patient currently states that his symptoms are resolved.  He has had prolonged ED wait time.  No associated diaphoresis, vomiting.  No abdominal pain reported.  Patient received sublingual nitroglycerin prior to arrival and aspirin.  Most recently treated with a course of amoxicillin ending mid March. ? ? ?  ? ?Home Medications ?Prior to Admission medications   ?Medication Sig Start Date End Date Taking? Authorizing Provider  ?albuterol (PROVENTIL) (2.5 MG/3ML) 0.083% nebulizer solution Take 3 mLs (2.5 mg total) by nebulization every 6 (six) hours as needed for wheezing or shortness of breath. 07/10/21   Susy Frizzle, MD  ?aspirin 325 MG tablet Take 325 mg by mouth daily.    [provider]  ?blood glucose meter kit and supplies Dispense based on patient and insurance preference. Use up to four times daily as directed. (FOR ICD-10 E10.9, E11.9). 08/18/20   Susy Frizzle, MD  ?clobetasol cream (TEMOVATE) 0.05 % Please specify directions, refills and quantity 08/27/18   Susy Frizzle, MD  ?clotrimazole (LOTRIMIN) 1 % cream Apply 1  application topically 2 (two) times daily. Apply to the head of the penis 07/03/20   Jenna Luo T, MD  ?glucose blood (ONETOUCH ULTRA) test strip USE TO CHECK BLOOD SUGAR 4 TO 6 TIMES DAILY. E11.9 04/19/21   Susy Frizzle, MD  ?HYDROcodone-acetaminophen (NORCO) 5-325 MG tablet Take 1 tablet by mouth every 6 (six) hours as needed for moderate pain. 09/13/21   Susy Frizzle, MD  ?hydrOXYzine (ATARAX) 25 MG tablet TAKE 1 TABLET BY MOUTH 3 TIMES DAILY AS NEEDED FOR ITCHING. 09/13/21   Susy Frizzle, MD  ?Insulin Syringe-Needle U-100 (BD INSULIN SYRINGE U/F) 31G X 5/16" 0.3 ML MISC USE WITH NOVOLIN TWICE DAY WITH A MEAL 03/15/20   Susy Frizzle, MD  ?nitroGLYCERIN (NITROSTAT) 0.4 MG SL tablet Place 0.4 mg under the tongue every 5 (five) minutes as needed for chest pain. 11/14/11   Lelon Perla, MD  ?NOVOLIN 70/30 (70-30) 100 UNIT/ML injection INJECT 8 UNITS SQ IN THE MORNING AND INJECT 12 UNITS SQ AT BEDTIME 05/22/21   Susy Frizzle, MD  ?OneTouch Delica Lancets 65K MISC USE TO CHECK BLOOD SUAGR 3 TIMES A DAY DX:E11.9 05/15/20   Susy Frizzle, MD  ?pantoprazole (PROTONIX) 40 MG tablet TAKE 1 TABLET BY MOUTH EVERY DAY 01/11/21   Susy Frizzle, MD  ?pravastatin (PRAVACHOL) 40 MG tablet Take 1 tablet (40 mg total) by mouth daily. 09/13/21   Susy Frizzle, MD  ?tamsulosin (FLOMAX) 0.4 MG CAPS capsule Take 1 capsule (0.4 mg total) by mouth daily. 01/29/21  Susy Frizzle, MD  ?triamcinolone cream (KENALOG) 0.1 % APPLY TO AFFECTED AREA TWICE A DAY 01/11/21   Susy Frizzle, MD  ?   ? ?Allergies    ?Celebrex [celecoxib]   ? ?Review of Systems   ?Review of Systems ? ?Physical Exam ?Updated Vital Signs ?BP (!) 115/58 (BP Location: Right Arm)   Pulse 81   Temp 98.9 ?F (37.2 ?C) (Oral)   Resp 16   SpO2 94%  ?Physical Exam ?Vitals and nursing note reviewed.  ?Constitutional:   ?   Appearance: He is well-developed. He is not diaphoretic.  ?HENT:  ?   Head: Normocephalic and atraumatic.  ?    Mouth/Throat:  ?   Mouth: Mucous membranes are not dry.  ?Eyes:  ?   Conjunctiva/sclera: Conjunctivae normal.  ?Neck:  ?   Vascular: Normal carotid pulses. No carotid bruit or JVD.  ?   Trachea: Trachea normal. No tracheal deviation.  ?Cardiovascular:  ?   Rate and Rhythm: Normal rate and regular rhythm.  ?   Pulses: No decreased pulses.     ?     Radial pulses are 2+ on the right side and 2+ on the left side.  ?   Heart sounds: Normal heart sounds, S1 normal and S2 normal. Heart sounds not distant. No murmur heard. ?Pulmonary:  ?   Effort: Pulmonary effort is normal. No respiratory distress.  ?   Breath sounds: Normal breath sounds. No wheezing.  ?Chest:  ?   Chest wall: No tenderness.  ?Abdominal:  ?   General: Bowel sounds are normal.  ?   Palpations: Abdomen is soft.  ?   Tenderness: There is no abdominal tenderness. There is no guarding or rebound.  ?Musculoskeletal:  ?   Cervical back: Normal range of motion and neck supple. No muscular tenderness.  ?   Right lower leg: No edema.  ?   Left lower leg: No edema.  ?Skin: ?   General: Skin is warm and dry.  ?   Coloration: Skin is not pale.  ?Neurological:  ?   Mental Status: He is alert. Mental status is at baseline.  ?Psychiatric:     ?   Mood and Affect: Mood normal.  ? ? ?ED Results / Procedures / Treatments   ?Labs ?(all labs ordered are listed, but only abnormal results are displayed) ?Labs Reviewed  ?COMPREHENSIVE METABOLIC PANEL - Abnormal; Notable for the following components:  ?    Result Value  ? Glucose, Bld 146 (*)   ? BUN 25 (*)   ? Creatinine, Ser 1.43 (*)   ? GFR, Estimated 48 (*)   ? All other components within normal limits  ?CBC WITH DIFFERENTIAL/PLATELET - Abnormal; Notable for the following components:  ? WBC 12.4 (*)   ? Hemoglobin 12.5 (*)   ? HCT 38.8 (*)   ? RDW 17.2 (*)   ? Neutro Abs 9.1 (*)   ? Monocytes Absolute 1.4 (*)   ? All other components within normal limits  ?LACTIC ACID, PLASMA  ?LACTIC ACID, PLASMA  ?BRAIN NATRIURETIC PEPTIDE   ?TROPONIN I (HIGH SENSITIVITY)  ?TROPONIN I (HIGH SENSITIVITY)  ? ? ?EKG ?EKG Interpretation ? ?Date/Time:  Tuesday October 02 2021 20:15:34 EDT ?Ventricular Rate:  86 ?PR Interval:  142 ?QRS Duration: 90 ?QT Interval:  352 ?QTC Calculation: 421 ?R Axis:   64 ?Text Interpretation: Normal sinus rhythm Nonspecific ST abnormality Abnormal ECG When compared with ECG of 30-Jul-2017 01:59, Non-specific ST-t changes are  improved Confirmed by Pattricia Boss [phone]) on 10/03/2021 10:40:49 AM ? ?Radiology ?DG Chest 2 View ? ?Result Date: 10/02/2021 ?CLINICAL DATA:  Chest pain. EXAM: CHEST - 2 VIEW COMPARISON:  July 10, 2021 FINDINGS: Multiple sternal wires and vascular clips are seen. The lungs are hyperinflated. There is no evidence of acute infiltrate. A small right pleural effusion is seen. No pneumothorax is identified. The heart size and mediastinal contours are within normal limits. The visualized skeletal structures are unremarkable. IMPRESSION: 1. Evidence of prior median sternotomy/CABG. 2. Small right pleural effusion. Electronically Signed   By: Virgina Norfolk M.D.   On: 10/02/2021 22:37   ? ?Procedures ?Procedures  ? ? ?Medications Ordered in ED ?Medications - No data to display ? ?ED Course/ Medical Decision Making/ A&P ?Clinical Course as of 10/03/21 1333  ?Wed Apr 12, 108  ?754 87 year old male brought in by ambulance for evaluation of chest pain generalized weakness.  Has been treated for pneumonia recently.  Room air sat okay.  Getting labs chest x-ray imaging and likely CT angio.  Disposition per results of testing. [MB]  ?  ?Clinical Course User Index ?[MB] Hayden Rasmussen, MD  ? ? ?Patient seen and examined. History obtained directly from patient. Work-up including labs, imaging, EKG ordered in triage, if performed, were reviewed.   ? ?Labs/EKG: Independently reviewed and interpreted.  This included: EKG; Trop 5 >> 6; CMP with creatinine 1.43/BUN 25, glucose 146 otherwise unremarkable; lactate 1.6;  CBC white count 12.4, hemoglobin 12.5.  Will recheck EKG to evaluate for stability, add BNP, will ambulate patient. ? ?Imaging: Independently visualized and interpreted.  This included: Chest x-ray shows

## 2021-10-03 NOTE — ED Notes (Signed)
Patient found to be at 89% RA. Patients granddaughter states he wears oxygen at night; unsure how much. Patient sats improved to 93% on 3L. Triage RN notified.  ?

## 2021-10-03 NOTE — Progress Notes (Signed)
Pharmacy Antibiotic Note ? ?[name] is a 87 y.o. male admitted on 10/02/2021 with pneumonia.  Pharmacy has been consulted for vancomycin and cefepime dosing. ? ?WBC and SCr elevated. LA wnl. CrCl ~ 33 mL/min ? ?Plan: ?-Cefepime 2 gm IV Q 12 hours ?-Vancomycin 1250 mg IV load followed by Vancomycin 750 mg IV Q 24 hrs. Goal AUC 400-550. ?Expected AUC: 518 ?SCr used: 1.43 ?-Monitor CBC, renal fx, cultures and clinical progress ?-Vanc levels as indicated  ? ?  ? ?Temp (24hrs), Avg:98.8 ?F (37.1 ?C), Min:98.6 ?F (37 ?C), Max:98.9 ?F (37.2 ?C) ? ?Recent Labs  ?Lab 10/02/21 ?2035 10/03/21 ?1240  ?WBC 12.4*  --   ?CREATININE 1.43*  --   ?LATICACIDVEN 1.6 1.6  ?  ?CrCl cannot be calculated (Unknown ideal weight.).   ? ?Allergies  ?Allergen Reactions  ? Celebrex [Celecoxib] Rash and Other (See Comments)  ?  Blisters, also  ? Tape Other (See Comments)  ?  EKG leads leave red marks and PLEASE USE COBAN WRAP!! Skin tears and bruises easily  ? ? ?Antimicrobials this admission: ?Cefepime 4/12 >>  ?Vancomycin 4/12 >>  ?Ceftriaxone 4/12 x 1 dose ?Azithromycin 4/12 x 1 dose  ?Metronidazole 4/12 >>  ? ?Dose adjustments this admission: ? ? ?Microbiology results: ? ?Thank you for allowing pharmacy to be a part of this patient?s care. ? ?Albertina Parr, PharmD., BCCCP ?Clinical Pharmacist ?Please refer to [facility] for unit-specific pharmacist  ? ? ?

## 2021-10-04 ENCOUNTER — Inpatient Hospital Stay (HOSPITAL_COMMUNITY): Payer: Medicare HMO

## 2021-10-04 DIAGNOSIS — J9601 Acute respiratory failure with hypoxia: Secondary | ICD-10-CM | POA: Diagnosis not present

## 2021-10-04 DIAGNOSIS — J449 Chronic obstructive pulmonary disease, unspecified: Secondary | ICD-10-CM | POA: Diagnosis not present

## 2021-10-04 DIAGNOSIS — J690 Pneumonitis due to inhalation of food and vomit: Secondary | ICD-10-CM | POA: Diagnosis not present

## 2021-10-04 DIAGNOSIS — N183 Chronic kidney disease, stage 3 unspecified: Secondary | ICD-10-CM | POA: Diagnosis not present

## 2021-10-04 LAB — BASIC METABOLIC PANEL
Anion gap: 7 (ref 5–15)
BUN: 26 mg/dL — ABNORMAL HIGH (ref 8–23)
CO2: 21 mmol/L — ABNORMAL LOW (ref 22–32)
Calcium: 8.5 mg/dL — ABNORMAL LOW (ref 8.9–10.3)
Chloride: 106 mmol/L (ref 98–111)
Creatinine, Ser: 1.35 mg/dL — ABNORMAL HIGH (ref 0.61–1.24)
GFR, Estimated: 52 mL/min — ABNORMAL LOW (ref >60)
Glucose, Bld: 160 mg/dL — ABNORMAL HIGH (ref 70–99)
Potassium: 4.4 mmol/L (ref 3.5–5.1)
Sodium: 134 mmol/L — ABNORMAL LOW (ref 135–145)

## 2021-10-04 LAB — GLUCOSE, CAPILLARY
Glucose-Capillary: 312 mg/dL — ABNORMAL HIGH (ref 70–99)
Glucose-Capillary: 289 mg/dL — ABNORMAL HIGH (ref 70–99)
Glucose-Capillary: 143 mg/dL — ABNORMAL HIGH (ref 70–99)

## 2021-10-04 LAB — CBC
HCT: 36.6 % — ABNORMAL LOW (ref 39.0–52.0)
Hemoglobin: 11.4 g/dL — ABNORMAL LOW (ref 13.0–17.0)
MCH: 28.4 pg (ref 26.0–34.0)
MCHC: 31.1 g/dL (ref 30.0–36.0)
MCV: 91 fL (ref 80.0–100.0)
Platelets: 190 10*3/uL (ref 150–400)
RBC: 4.02 MIL/uL — ABNORMAL LOW (ref 4.22–5.81)
RDW: 17.2 % — ABNORMAL HIGH (ref 11.5–15.5)
WBC: 15.3 10*3/uL — ABNORMAL HIGH (ref 4.0–10.5)
nRBC: 0 % (ref 0.0–0.2)

## 2021-10-04 LAB — CBG MONITORING, ED
Glucose-Capillary: 123 mg/dL — ABNORMAL HIGH (ref 70–99)
Glucose-Capillary: 249 mg/dL — ABNORMAL HIGH (ref 70–99)

## 2021-10-04 LAB — TSH: TSH: 3.393 u[IU]/mL (ref 0.350–4.500)

## 2021-10-04 MED ORDER — GUAIFENESIN ER 600 MG PO TB12
1200.0000 mg | ORAL_TABLET | Freq: Two times a day (BID) | ORAL | Status: DC
  Administered 2021-10-04 – 2021-10-08 (×8): 1200 mg via ORAL
  Filled 2021-10-04 (×8): qty 2

## 2021-10-04 MED ORDER — IPRATROPIUM-ALBUTEROL 0.5-2.5 (3) MG/3ML IN SOLN
3.0000 mL | Freq: Two times a day (BID) | RESPIRATORY_TRACT | Status: DC
  Administered 2021-10-04: 3 mL via RESPIRATORY_TRACT
  Filled 2021-10-04 (×2): qty 3

## 2021-10-04 MED ORDER — PANTOPRAZOLE SODIUM 40 MG PO TBEC
40.0000 mg | DELAYED_RELEASE_TABLET | Freq: Every day | ORAL | Status: DC
Start: 2021-10-04 — End: 2021-10-08
  Administered 2021-10-04 – 2021-10-07 (×4): 40 mg via ORAL
  Filled 2021-10-04 (×4): qty 1

## 2021-10-04 MED ORDER — SODIUM CHLORIDE 0.9 % IV SOLN
3.0000 g | Freq: Three times a day (TID) | INTRAVENOUS | Status: AC
  Administered 2021-10-04 – 2021-10-07 (×11): 3 g via INTRAVENOUS
  Filled 2021-10-04 (×12): qty 8

## 2021-10-04 MED ORDER — ACETYLCYSTEINE 20 % IN SOLN
4.0000 mL | Freq: Two times a day (BID) | RESPIRATORY_TRACT | Status: AC
  Administered 2021-10-05 (×2): 4 mL via RESPIRATORY_TRACT
  Filled 2021-10-04 (×4): qty 4

## 2021-10-04 MED ORDER — REVEFENACIN 175 MCG/3ML IN SOLN
175.0000 ug | Freq: Every day | RESPIRATORY_TRACT | Status: DC
  Administered 2021-10-05 – 2021-10-08 (×4): 175 ug via RESPIRATORY_TRACT
  Filled 2021-10-04 (×5): qty 3

## 2021-10-04 MED ORDER — ARFORMOTEROL TARTRATE 15 MCG/2ML IN NEBU
15.0000 ug | INHALATION_SOLUTION | Freq: Two times a day (BID) | RESPIRATORY_TRACT | Status: DC
  Administered 2021-10-04 – 2021-10-08 (×8): 15 ug via RESPIRATORY_TRACT
  Filled 2021-10-04 (×9): qty 2

## 2021-10-04 NOTE — Progress Notes (Signed)
Modified Barium Swallow Progress Note ? ?Patient Details  ?Name: [name] ?MRN: [id number] ?Date of Birth: 1935-04-03 ? ?Today's Date: 10/04/2021 ? ?Modified Barium Swallow completed.  Full report located under Chart Review in the Imaging Section. ? ?Brief recommendations include the following: ? ?Clinical Impression ? Pt presented with pharyngeal dysphagia characterized by reduced lingual retraction, reduced hyolaryngeal elevation, and reduced anterior laryngeal movement. He demonstrated incomplete epiglottic inversion, vallecular residue, posterior pharyngeal wall residue, and pyriform sinus residue. Penetration (PAS 3) was noted with thin liquids and nectar thick liquids secondary to incomplete epiglottic inversion. Epiglottic inversion and airway protection were improved with use of a chin tuck psoture and also with use of an effortful swallow. Prompted coughing and secondary swallows were effective in expelling most the the penetrate and in reducing pharyngeal residue. No instances of aspiration were noted. Transit of the 78mm barium tablet was halted at the level of the hyopharynx and then subsequently in the upper thoracic esophagus. It was moved from the hypopharynx with an additional bolus of thin liquids, but only moved past the upper thoracic esophagus with nectar thick liquids. Esopghageal sweep revealed stasis of barium in the upper and lower thoracic esophagus. This study is not diagnostic for esophageal dysphagia, but considering pt's history of recurrent pneumonia, and of esophageal dysphagia with three prior dilations, SLP recommends esophageal assessment and/or GI follow up. Results and recommendations were disucssed with the pt and his daughter-in-law; pt stated, "What are they trying to do- make me over?" and expressed that his swallow function is very low on his current priorities. Pt was educated regarding the potential for these recommendations reducing his risk of adverse  dysphagia-related events, but his interest did not improve thereafter. A regular texture diet with thin liquids will be continued with observance of swallowing precautions and SLP will follow for treatment. ?  ?Swallow Evaluation Recommendations ? ?   ? ? SLP Diet Recommendations: Regular solids;Thin liquid ? ? Liquid Administration via: Cup;Straw ? ? Medication Administration: Whole meds with puree ? ? Supervision: Full supervision/cueing for compensatory strategies ? ? Compensations: Slow rate;Small sips/bites;Follow solids with liquid;Effortful swallow/Chin tuck ? ? Postural Changes: Seated upright at 90 degrees ? ? Oral Care Recommendations: Oral care BID ? ?   ? ?[name] I. [name], [place], CCC-SLP ?Acute Rehabilitation Services ?Office number [phone] ?Pager [phone] ? ?[name] ?10/04/2021,1:26 PM ? ?

## 2021-10-04 NOTE — ED Notes (Signed)
The pt got up from the stretcher with all his wires attached sl confused.  When I went into the room he was standing at  the sink  reporting that he wanted to urinate. He refused the urinal and  walked to the bathroom.  When he returned from the bathroom  he was very sob  and had an unsteady gait.  Placed back onto the stretcher warm blankets and nasal 02 reapplied at 3 liters.  His respirations slowly slowed down  he had just had a deoneb   ?

## 2021-10-04 NOTE — Progress Notes (Signed)
Pharmacy Antibiotic Note ? ?[name] is a 87 y.o. male admitted on 10/02/2021 presenting with SOB, weakness.  Pharmacy has been consulted for Unasyn dosing. ? ?Plan: ?Unasyn 3g IV q 8h ?Monitor renal function, clinical progression and LOT ? ?  ? ?No data recorded. ? ?Recent Labs  ?Lab 10/02/21 ?2035 10/03/21 ?1240 10/04/21 ?0122  ?WBC 12.4*  --  15.3*  ?CREATININE 1.43*  --  1.35*  ?LATICACIDVEN 1.6 1.6  --   ?  ?CrCl cannot be calculated (Unknown ideal weight.).   ? ?Allergies  ?Allergen Reactions  ? Celebrex [Celecoxib] Rash and Other (See Comments)  ?  Blisters, also  ? Tape Other (See Comments)  ?  EKG leads leave red marks and PLEASE USE COBAN WRAP!! Skin tears and bruises easily  ? ? ?Bertis Ruddy, PharmD ?Clinical Pharmacist ?ED Pharmacist Phone # [phone] ?10/04/2021 11:37 AM ? ? ?

## 2021-10-04 NOTE — Progress Notes (Signed)
?      ?                 PROGRESS NOTE ? ?      ?PATIENT DETAILS ?Name: [name] ?Age: 87 y.o. ?Sex: male ?Date of Birth: 11/10/1934 ?Admit Date: 10/02/2021 ?Admitting Physician Charlann Lange, MD ?QBH:[id number], Cammie Mcgee, MD ? ?Brief Summary: ?Patient is a 87 y.o.  male known RUL lung nodule (positive PET scan-he refused bronchoscopy/biopsy)-chronic dysphagia with silent aspiration, COPD with chronic hypoxic respiratory failure on home O2 who presented with progressively worsening shortness of breath, weakness.  He was found to have acute on chronic hypoxic respiratory failure due to mucous plugging causing right mid lung collapse and postobstructive pneumonia. ? ? ?Significant events: ?4/11>> admit to [facility] for progressive weakness/shortness of breath-mucous plugging/right mid lung collapse and postobstructive PNA. ? ?Significant studies: ?4/12 >>CTA chest: No PE, mucus impaction of bronchus intermedius resulting in right middle lobe collapse and postobstructive PNA.  Unchanged right upper lobe spiculated lung nodule concerning for primary bronchogenic carcinoma.  Severe COPD. ? ?Significant microbiology data: ?4/12>> COVID/influenza PCR: Negative ?4/12>> blood culture: Negative ? ?Procedures: ?None ? ?Consults: ?None  ? ?Subjective: ?Looks very cachectic-complains of weakness.  Eating breakfast.  Daughter-in-law at bedside. ? ?Objective: ?Vitals: ?Blood pressure (!) 114/58, pulse 79, temperature 98.9 ?F (37.2 ?C), temperature source Oral, resp. rate (!) 26, SpO2 98 %.  ? ?Exam: ?Gen Exam:Alert awake-not in any distress.  Cachectic-looks chronically sick appearing. ?HEENT:atraumatic, normocephalic ?Chest: Transmitted upper airway sounds-especially in the right lung area.  Some scattered rhonchi. ?CVS:S1S2 regular ?Abdomen:soft non tender, non distended ?Extremities:no edema ?Neurology: Non focal ?Skin: no rash ? ?Pertinent Labs/Radiology: ? ?  Latest Ref Rng & Units 10/04/2021  ?  5:16 AM 10/02/2021  ?  8:35 PM 07/27/2021   ?  2:29 PM  ?CBC  ?WBC 4.0 - 10.5 K/uL 15.3   12.4   8.2    ?Hemoglobin 13.0 - 17.0 g/dL 11.4   12.5   12.1    ?Hematocrit 39.0 - 52.0 % 36.6   38.8   37.5    ?Platelets 150 - 400 K/uL 190   250   220    ?  ?Lab Results  ?Component Value Date  ? NA 134 (L) 10/04/2021  ? K 4.4 10/04/2021  ? CL 106 10/04/2021  ? CO2 21 (L) 10/04/2021  ?  ? ? ?Assessment/Plan: ?Acute on chronic hypoxic respiratory failure due to mucous plugging (?  Malignancy) causing right mid lung collapse and postobstructive PNA: Remains relatively stable-on around 3-4 L of oxygen this morning (home regimen around 2 L).  Plans are to de-escalate antibiotics to Unasyn, changed Mucinex to scheduled dosing, continue bronchodilators-we will add nebulized Mucomyst for a few days to see if this helps.  Encouraged patient to use incentive spirometry and flutter valve. ? ?Chronic dysphagia-documented silent aspiration in prior studies: Await SLP evaluation.  He appears profoundly deconditioned/debilitated and with significant amount of cachexia.  Significant debility likely contributing to mucoid impaction. ? ?History of RUL spiculated lung nodule-concerning for primary bronchogenic carcinoma: Known issue-positive PET scan in the past-patient refused further work-up including bronchoscopy/biopsy.  He appears incredibly cachectic-very frail and debilitated-suspect primary process driving debility/deconditioning is underlying cancer. ? ?CKD stage IIIb: Creatinine close to baseline ? ?COPD: Continue bronchodilators.  Not in exacerbation ? ?HLD: Continue statin ? ?DM-2 (A1c 7.5 on 2/3): CBGs relatively stable-continue Semglee 8 units daily and SSI. ? ?Recent Labs  ?  10/03/21 ?1708 10/03/21 ?2200  10/04/21 ?0743  ?GLUCAP 196* 253* 123*  ?  ?BPH: Continue Flomax ? ?Palliative care: DNR in place-Long discussion with daughter-in-law at bedside-and granddaughter (who was on the phone).  All aware of poor overall prognosis-family does not desire aggressive  care-recommended that we start getting palliative/hospice care involved.  Family wants to talk amongst Cristina Gong will let us know.  ? ?Underweight: ?Estimated body mass index is 18.72 kg/m? as calculated from the following: ?  Height as of 08/31/21: 6' (1.829 m). ?  Weight as of 08/31/21: 62.6 kg.  ? ?Code status: ?  Code Status: DNR  ? ?DVT Prophylaxis: ?enoxaparin (LOVENOX) injection 40 mg Start: 10/03/21 1600 ?  ?Family Communication: Daughter-in-law at bedside-granddaughter over the phone. ? ? ?Disposition Plan: ?Status is: Inpatient ?Remains inpatient appropriate because: Postoperative PNA-mucoid impaction causing right middle lobe collapse.  On antibiotics-not yet stable for discharge. ?  ?Planned Discharge Destination:Home ? ? ?Diet: ?Diet Order   ? ?       ?  Diet Carb Modified Fluid consistency: Thin; Room service appropriate? Yes  Diet effective now       ?  ? ?  ?  ? ?  ?  ? ? ?Antimicrobial agents: ?Anti-infectives (From admission, onward)  ? ? Start     Dose/Rate Route Frequency Ordered Stop  ? 10/04/21 1600  vancomycin (VANCOREADY) IVPB 750 mg/150 mL       ? 750 mg ?150 mL/hr over 60 Minutes Intravenous Every 24 hours 10/03/21 1446    ? 10/03/21 1600  ceFEPIme (MAXIPIME) 2 g in sodium chloride 0.9 % 100 mL IVPB       ? 2 g ?200 mL/hr over 30 Minutes Intravenous Every 12 hours 10/03/21 1443    ? 10/03/21 1515  vancomycin (VANCOREADY) IVPB 1250 mg/250 mL       ? 1,250 mg ?166.7 mL/hr over 90 Minutes Intravenous  Once 10/03/21 1443 10/03/21 1746  ? 10/03/21 1500  metroNIDAZOLE (FLAGYL) IVPB 500 mg       ? 500 mg ?100 mL/hr over 60 Minutes Intravenous Every 8 hours 10/03/21 1427    ? 10/03/21 1330  cefTRIAXone (ROCEPHIN) 1 g in sodium chloride 0.9 % 100 mL IVPB       ? 1 g ?200 mL/hr over 30 Minutes Intravenous  Once 10/03/21 1328 10/03/21 1515  ? 10/03/21 1330  azithromycin (ZITHROMAX) 500 mg in sodium chloride 0.9 % 250 mL IVPB       ? 500 mg ?250 mL/hr over 60 Minutes Intravenous  Once 10/03/21 1328  10/03/21 1737  ? ?  ? ? ? ?MEDICATIONS: ?Scheduled Meds: ? aspirin  325 mg Oral Daily  ? enoxaparin (LOVENOX) injection  40 mg Subcutaneous Daily  ? insulin aspart  0-6 Units Subcutaneous TID WC  ? insulin glargine-yfgn  8 Units Subcutaneous QHS  ? ipratropium-albuterol  3 mL Nebulization Q4H  ? pravastatin  40 mg Oral QHS  ? tamsulosin  0.4 mg Oral Daily  ? ?Continuous Infusions: ? ceFEPime (MAXIPIME) IV Stopped (10/04/21 0422)  ? metronidazole Stopped (10/04/21 0800)  ? vancomycin    ? ?PRN Meds:.acetaminophen **OR** acetaminophen, albuterol, guaiFENesin, HYDROcodone-acetaminophen, hydrOXYzine, nitroGLYCERIN, ondansetron **OR** ondansetron (ZOFRAN) IV ? ? ?I have personally reviewed following labs and imaging studies ? ?LABORATORY DATA: ?CBC: ?Recent Labs  ?Lab 10/02/21 ?2035 10/04/21 ?1941  ?WBC 12.4* 15.3*  ?NEUTROABS 9.1*  --   ?HGB 12.5* 11.4*  ?HCT 38.8* 36.6*  ?MCV 89.4 91.0  ?PLT 250 190  ? ? ?Basic Metabolic  Panel: ?Recent Labs  ?Lab 10/02/21 ?2035 10/04/21 ?2890  ?NA 136 134*  ?K 4.6 4.4  ?CL 104 106  ?CO2 25 21*  ?GLUCOSE 146* 160*  ?BUN 25* 26*  ?CREATININE 1.43* 1.35*  ?CALCIUM 9.1 8.5*  ? ? ?GFR: ?CrCl cannot be calculated (Unknown ideal weight.). ? ?Liver Function Tests: ?Recent Labs  ?Lab 10/02/21 ?2035  ?AST 15  ?ALT 9  ?ALKPHOS 54  ?BILITOT 0.7  ?PROT 8.1  ?ALBUMIN 3.5  ? ?No results for input(s): LIPASE, AMYLASE in the last 168 hours. ?No results for input(s): AMMONIA in the last 168 hours. ? ?Coagulation Profile: ?No results for input(s): INR, PROTIME in the last 168 hours. ? ?Cardiac Enzymes: ?No results for input(s): CKTOTAL, CKMB, CKMBINDEX, TROPONINI in the last 168 hours. ? ?BNP (last 3 results) ?No results for input(s): PROBNP in the last 8760 hours. ? ?Lipid Profile: ?No results for input(s): CHOL, HDL, LDLCALC, TRIG, CHOLHDL, LDLDIRECT in the last 72 hours. ? ?Thyroid Function Tests: ?Recent Labs  ?  10/04/21 ?2284  ?TSH 3.393  ? ? ?Anemia Panel: ?No results for input(s): VITAMINB12,  FOLATE, FERRITIN, TIBC, IRON, RETICCTPCT in the last 72 hours. ? ?Urine analysis: ?   ?Component Value Date/Time  ? [place] 01/29/2021 1424  ? APPEARANCEUR CLEAR 01/29/2021 1424  ? LABSPEC 1.020 08/08/2

## 2021-10-05 DIAGNOSIS — J690 Pneumonitis due to inhalation of food and vomit: Secondary | ICD-10-CM | POA: Diagnosis not present

## 2021-10-05 DIAGNOSIS — J9601 Acute respiratory failure with hypoxia: Secondary | ICD-10-CM | POA: Diagnosis not present

## 2021-10-05 DIAGNOSIS — J449 Chronic obstructive pulmonary disease, unspecified: Secondary | ICD-10-CM | POA: Diagnosis not present

## 2021-10-05 DIAGNOSIS — N183 Chronic kidney disease, stage 3 unspecified: Secondary | ICD-10-CM | POA: Diagnosis not present

## 2021-10-05 DIAGNOSIS — E43 Unspecified severe protein-calorie malnutrition: Secondary | ICD-10-CM | POA: Insufficient documentation

## 2021-10-05 LAB — CBC
HCT: 32.6 % — ABNORMAL LOW (ref 39.0–52.0)
Hemoglobin: 10.5 g/dL — ABNORMAL LOW (ref 13.0–17.0)
MCH: 28.4 pg (ref 26.0–34.0)
MCHC: 32.2 g/dL (ref 30.0–36.0)
MCV: 88.1 fL (ref 80.0–100.0)
Platelets: 189 10*3/uL (ref 150–400)
RBC: 3.7 MIL/uL — ABNORMAL LOW (ref 4.22–5.81)
RDW: 16.9 % — ABNORMAL HIGH (ref 11.5–15.5)
WBC: 11.1 10*3/uL — ABNORMAL HIGH (ref 4.0–10.5)
nRBC: 0 % (ref 0.0–0.2)

## 2021-10-05 LAB — GLUCOSE, CAPILLARY
Glucose-Capillary: 117 mg/dL — ABNORMAL HIGH (ref 70–99)
Glucose-Capillary: 246 mg/dL — ABNORMAL HIGH (ref 70–99)
Glucose-Capillary: 184 mg/dL — ABNORMAL HIGH (ref 70–99)
Glucose-Capillary: 300 mg/dL — ABNORMAL HIGH (ref 70–99)

## 2021-10-05 LAB — BASIC METABOLIC PANEL
Anion gap: 5 (ref 5–15)
BUN: 23 mg/dL (ref 8–23)
CO2: 21 mmol/L — ABNORMAL LOW (ref 22–32)
Calcium: 8.3 mg/dL — ABNORMAL LOW (ref 8.9–10.3)
Chloride: 107 mmol/L (ref 98–111)
Creatinine, Ser: 1.3 mg/dL — ABNORMAL HIGH (ref 0.61–1.24)
GFR, Estimated: 54 mL/min — ABNORMAL LOW (ref >60)
Glucose, Bld: 123 mg/dL — ABNORMAL HIGH (ref 70–99)
Potassium: 4.5 mmol/L (ref 3.5–5.1)
Sodium: 133 mmol/L — ABNORMAL LOW (ref 135–145)

## 2021-10-05 MED ORDER — ENSURE MAX PROTEIN PO LIQD
11.0000 [oz_av] | Freq: Two times a day (BID) | ORAL | Status: DC
  Administered 2021-10-05 – 2021-10-07 (×6): 11 [oz_av] via ORAL

## 2021-10-05 MED ORDER — IPRATROPIUM-ALBUTEROL 0.5-2.5 (3) MG/3ML IN SOLN
3.0000 mL | Freq: Two times a day (BID) | RESPIRATORY_TRACT | Status: DC
  Administered 2021-10-05 – 2021-10-08 (×7): 3 mL via RESPIRATORY_TRACT
  Filled 2021-10-05 (×8): qty 3

## 2021-10-05 MED ORDER — ASPIRIN EC 81 MG PO TBEC
81.0000 mg | DELAYED_RELEASE_TABLET | Freq: Every day | ORAL | Status: DC
  Administered 2021-10-06 – 2021-10-08 (×3): 81 mg via ORAL
  Filled 2021-10-05 (×3): qty 1

## 2021-10-05 MED ORDER — ADULT MULTIVITAMIN W/MINERALS CH
1.0000 | ORAL_TABLET | Freq: Every day | ORAL | Status: DC
  Administered 2021-10-05 – 2021-10-08 (×4): 1 via ORAL
  Filled 2021-10-05 (×4): qty 1

## 2021-10-05 NOTE — Evaluation (Signed)
Physical Therapy Evaluation ?Patient Details ?Name: [name] ?MRN: [id number] ?DOB: 1934/11/05 ?Today's Date: 10/05/2021 ? ?History of Present Illness ? Pt is an 87 year old man admitted on 10/03/21 with SOB, weakness, mucous plugging, R mid lung collapse and posterior obstructive PNA. Pt with lung nodule concerning for malignancy. PMH: COPD, uses 3L at night per family, CKD, DM2, HLD, CAD, aflutter, BPH, HTN.  ?Clinical Impression ? Pt admitted with above diagnosis. At baseline, pt is independent and has 3 L O2 at night only.  Today, pt requiring supervision to min guard for safety, very mild deficits in balance, and was able to ambulate 200' without AD.  Pt was on 3 L of O2 at arrival with sats 100%; ambulated 100' on 3 L sats maintained 100%, tried 2 L for remainder of ambulation and 99%.  Notified RN that pt tolerating decreased O2.  Pt currently with functional limitations due to the deficits listed below (see PT Problem List). Pt will benefit from skilled PT to increase their independence and safety with mobility to allow discharge to the venue listed below.   ?   ?   ? ?Recommendations for follow up therapy are one component of a multi-disciplinary discharge planning process, led by the attending physician.  Recommendations may be updated based on patient status, additional functional criteria and insurance authorization. ? ?Follow Up Recommendations Home health PT ? ?  ?Assistance Recommended at Discharge Intermittent Supervision/Assistance  ?Patient can return home with the following ? Help with stairs or ramp for entrance;Assistance with cooking/housework ? ?  ?Equipment Recommendations None recommended by PT  ?Recommendations for Other Services ?    ?  ?Functional Status Assessment Patient has had a recent decline in their functional status and demonstrates the ability to make significant improvements in function in a reasonable and predictable amount of time.  ? ?  ?Precautions / Restrictions  Precautions ?Precautions: Fall  ? ?  ? ?Mobility ? Bed Mobility ?Overal bed mobility: Modified Independent ?  ?  ?  ?  ?  ?  ?General bed mobility comments: HOB up ?  ? ?Transfers ?Overall transfer level: Needs assistance ?Equipment used: None ?Transfers: Sit to/from Stand ?Sit to Stand: Min guard ?  ?  ?  ?  ?  ?  ?  ? ?Ambulation/Gait ?Ambulation/Gait assistance: Min guard ?Gait Distance (Feet): 200 Feet ?Assistive device: None ?Gait Pattern/deviations: Step-through pattern, Decreased stride length ?Gait velocity: decreased ?  ?  ?General Gait Details: No overt LOB; min guard for safety; DOE of 1/4; ambulated on 3 L initially with sats 100% and back on 2 L with sats 99% (notified RN) ? ?Stairs ?  ?  ?  ?  ?  ? ?Wheelchair Mobility ?  ? ?Modified Rankin (Stroke Patients Only) ?  ? ?  ? ?Balance Overall balance assessment: Needs assistance ?  ?Sitting balance-Leahy Scale: Good ?  ?  ?Standing balance support: No upper extremity supported ?Standing balance-Leahy Scale: Fair ?  ?  ?  ?  ?  ?  ?  ?  ?  ?  ?  ?  ?   ? ? ? ?Pertinent Vitals/Pain Pain Assessment ?Pain Assessment: No/denies pain  ? ? ?Home Living Family/patient expects to be discharged to:: Private residence ?Living Arrangements: Alone ?Available Help at Discharge: Family;Available 24 hours/day ?Type of Home: House ?Home Access: Stairs to enter ?  ?Entrance Stairs-Number of Steps: 1 ?  ?Home Layout: One level;Other (Comment) (2 steps down into den with rail) ?  Home Equipment: Kasandra Knudsen - single point ?Additional Comments: reports he put a chair in his tub to sit on  ?  ?Prior Function Prior Level of Function : Independent/Modified Independent;Driving ?  ?  ?  ?  ?  ?  ?Mobility Comments: could ambulate in community without AD; denies falls ?ADLs Comments: does have help for heavy housekeeping ?  ? ? ?Hand Dominance  ? Dominant Hand: Right ? ?  ?Extremity/Trunk Assessment  ? Upper Extremity Assessment ?Upper Extremity Assessment: Overall WFL for tasks assessed ?   ? ?Lower Extremity Assessment ?Lower Extremity Assessment: Overall WFL for tasks assessed (ROM WFL; MMT 5/5) ?  ? ?Cervical / Trunk Assessment ?Cervical / Trunk Assessment: Normal  ?Communication  ? Communication: HOH  ?Cognition Arousal/Alertness: Awake/alert ?Behavior During Therapy: [facility] for tasks assessed/performed ?Overall Cognitive Status: Within Functional Limits for tasks assessed ?  ?  ?  ?  ?  ?  ?  ?  ?  ?  ?  ?  ?  ?  ?  ?  ?  ?  ?  ? ?  ?General Comments   ? ?  ?Exercises    ? ?Assessment/Plan  ?  ?PT Assessment Patient needs continued PT services  ?PT Problem List Decreased strength;Decreased mobility;Decreased activity tolerance;Cardiopulmonary status limiting activity;Decreased balance ? ?   ?  ?PT Treatment Interventions DME instruction;Therapeutic activities;Gait training;Therapeutic exercise;Patient/family education;Balance training;Stair training;Functional mobility training   ? ?PT Goals (Current goals can be found in the Care Plan section)  ?Acute Rehab PT Goals ?Patient Stated Goal: return home ?PT Goal Formulation: With patient/family ?Time For Goal Achievement: 10/19/21 ?Potential to Achieve Goals: Good ? ?  ?Frequency Min 3X/week ?  ? ? ?Co-evaluation   ?  ?  ?  ?  ? ? ?  ?AM-PAC PT "6 Clicks" Mobility  ?Outcome Measure Help needed turning from your back to your side while in a flat bed without using bedrails?: None ?Help needed moving from lying on your back to sitting on the side of a flat bed without using bedrails?: None ?Help needed moving to and from a bed to a chair (including a wheelchair)?: A Little ?Help needed standing up from a chair using your arms (e.g., wheelchair or bedside chair)?: A Little ?Help needed to walk in hospital room?: A Little ?Help needed climbing 3-5 steps with a railing? : A Little ?6 Click Score: 20 ? ?  ?End of Session Equipment Utilized During Treatment: Gait belt;Oxygen ?Activity Tolerance: Patient tolerated treatment well ?Patient left: in bed;with call  bell/phone within reach;with bed alarm set;with family/visitor present (sitting EOB) ?Nurse Communication: Mobility status ?PT Visit Diagnosis: Other abnormalities of gait and mobility (R26.89);Muscle weakness (generalized) (M62.81) ?  ? ?Time: 1252-1310 ?PT Time Calculation (min) (ACUTE ONLY): 18 min ? ? ?Charges:   PT Evaluation ?$PT Eval Low Complexity: 1 Low ?  ?  ?   ? ? ?Abran Richard, PT ?Acute Rehab Services ?Pager [phone] ?[name] [phone] ? ? ?Mikael Spray Kendrix Orman ?10/05/2021, 1:23 PM ? ?

## 2021-10-05 NOTE — Evaluation (Signed)
Occupational Therapy Evaluation ?Patient Details ?Name: [name] ?MRN: [id number] ?DOB: 1934-12-18 ?Today's Date: 10/05/2021 ? ? ?History of Present Illness Pt is an 87 year old man admitted on 10/03/21 with SOB, weakness, mucous plugging, R mid lung collapse and posterior obstructive PNA. Pt with lung nodule concerning for malignancy. PMH: COPD, uses 3L at night per family, CKD, DM2, HLD, CAD, aflutter, BPH, HTN.  ? ?Clinical Impression ?  ?Pt was assisted for heavy housework, otherwise independent in mobility, ADLs and driving. He uses 3L 02 at night. He is HOH, most questions answered by granddaughter. Pt presents with impaired standing balance, stabilized in standing with IV pole, and decreased activity tolerance. He requires supervision for OOB and set up to supervision for ADLs. Pt has a makeshift seat for showering, recommended tub transfer bench. Will follow acutely.  ?   ? ?Recommendations for follow up therapy are one component of a multi-disciplinary discharge planning process, led by the attending physician.  Recommendations may be updated based on patient status, additional functional criteria and insurance authorization.  ? ?Follow Up Recommendations ? Home health OT  ?  ?Assistance Recommended at Discharge Frequent or constant Supervision/Assistance (initially)  ?Patient can return home with the following A little help with walking and/or transfers;A little help with bathing/dressing/bathroom;Assistance with cooking/housework;Assist for transportation ? ?  ?Functional Status Assessment ? Patient has had a recent decline in their functional status and demonstrates the ability to make significant improvements in function in a reasonable and predictable amount of time.  ?Equipment Recommendations ? Tub/shower bench (granddaughter aware insurance does not pay for)  ?  ?Recommendations for Other Services   ? ? ?  ?Precautions / Restrictions Precautions ?Precautions: Fall  ? ?  ? ?Mobility Bed  Mobility ?Overal bed mobility: Modified Independent ?  ?  ?  ?  ?  ?  ?General bed mobility comments: HOB up ?  ? ?Transfers ?Overall transfer level: Needs assistance ?  ?Transfers: Sit to/from Stand ?Sit to Stand: Supervision ?  ?  ?  ?  ?  ?General transfer comment: reaching for IV pole upon standing ?  ? ?  ?Balance Overall balance assessment: Needs assistance ?  ?Sitting balance-Leahy Scale: Good ?  ?  ?Standing balance support: Single extremity supported ?Standing balance-Leahy Scale: Fair ?  ?  ?  ?  ?  ?  ?  ?  ?  ?  ?  ?  ?   ? ?ADL either performed or assessed with clinical judgement  ? ?ADL Overall ADL's : Needs assistance/impaired ?Eating/Feeding: Independent;Sitting ?  ?Grooming: Supervision/safety;Standing;Wash/dry hands ?  ?Upper Body Bathing: Set up;Sitting ?  ?Lower Body Bathing: Supervison/ safety;Sit to/from stand ?  ?Upper Body Dressing : Set up;Sitting ?  ?Lower Body Dressing: Supervision/safety;Sit to/from stand ?  ?Toilet Transfer: Supervision/safety;Ambulation ?  ?Toileting- Clothing Manipulation and Hygiene: Supervision/safety;Sit to/from stand ?  ?  ?  ?Functional mobility during ADLs: Supervision/safety (pushed IV pole) ?   ? ? ? ?Vision Baseline Vision/History: 1 Wears glasses ?Ability to See in Adequate Light: 0 Adequate ?Patient Visual Report: No change from baseline ?Additional Comments: wears readers  ?   ?Perception   ?  ?Praxis   ?  ? ?Pertinent Vitals/Pain Pain Assessment ?Pain Assessment: No/denies pain  ? ? ? ?Hand Dominance Right ?  ?Extremity/Trunk Assessment Upper Extremity Assessment ?Upper Extremity Assessment: Overall WFL for tasks assessed ?  ?Lower Extremity Assessment ?Lower Extremity Assessment: Defer to PT evaluation ?  ?  ?  ?Communication  Communication ?Communication: HOH ?  ?Cognition Arousal/Alertness: Awake/alert ?Behavior During Therapy: [facility] for tasks assessed/performed ?Overall Cognitive Status: Within Functional Limits for tasks assessed ?  ?  ?  ?  ?  ?  ?  ?   ?  ?  ?  ?  ?  ?  ?  ?  ?  ?  ?  ?General Comments    ? ?  ?Exercises   ?  ?Shoulder Instructions    ? ? ?Home Living Family/patient expects to be discharged to:: Private residence ?Living Arrangements: Alone ?Available Help at Discharge: Family;Available 24 hours/day ?Type of Home: House ?Home Access: Stairs to enter ?Entrance Stairs-Number of Steps: 1 ?  ?Home Layout: One level;Other (Comment) (has 2 steps down into his den with a rail) ?  ?  ?Bathroom Shower/Tub: Tub/shower unit ?  ?Bathroom Toilet: Handicapped height ?  ?  ?Home Equipment: None ?  ?Additional Comments: reports he put a chair in his tub to sit on ?  ? ?  ?Prior Functioning/Environment Prior Level of Function : Independent/Modified Independent;Driving ?  ?  ?  ?  ?  ?  ?  ?ADLs Comments: does have help for heavy housekeeping ?  ? ?  ?  ?OT Problem List: Decreased strength;Impaired balance (sitting and/or standing) ?  ?   ?OT Treatment/Interventions: Self-care/ADL training;DME and/or AE instruction;Therapeutic activities;Patient/family education;Balance training  ?  ?OT Goals(Current goals can be found in the care plan section) Acute Rehab OT Goals ?OT Goal Formulation: With patient ?Time For Goal Achievement: 10/19/21 ?Potential to Achieve Goals: Good ?ADL Goals ?Pt Will Perform Grooming: with modified independence;standing ?Pt Will Perform Lower Body Bathing: with modified independence;sit to/from stand ?Pt Will Perform Lower Body Dressing: with modified independence;sit to/from stand ?Pt Will Transfer to Toilet: with modified independence;ambulating ?Pt Will Perform Toileting - Clothing Manipulation and hygiene: with modified independence;sit to/from stand ?Pt Will Perform Tub/Shower Transfer: Tub transfer;with modified independence;tub bench ?Additional ADL Goal #1: Pt will generalize energy conservation strategies in ADLs and mobility.  ?OT Frequency: Min 2X/week ?  ? ?Co-evaluation   ?  ?  ?  ?  ? ?  ?AM-PAC OT "6 Clicks" Daily Activity      ?Outcome Measure Help from another person eating meals?: None ?Help from another person taking care of personal grooming?: A Little ?Help from another person toileting, which includes using toliet, bedpan, or urinal?: A Little ?Help from another person bathing (including washing, rinsing, drying)?: A Little ?Help from another person to put on and taking off regular upper body clothing?: None ?Help from another person to put on and taking off regular lower body clothing?: A Little ?6 Click Score: 20 ?  ?End of Session Equipment Utilized During Treatment: Oxygen (3L) ? ?Activity Tolerance: Patient tolerated treatment well ?Patient left: in chair;with call bell/phone within reach;with family/visitor present ? ?OT Visit Diagnosis: Unsteadiness on feet (R26.81)  ?              ?Time: 0272-5366 ?OT Time Calculation (min): 25 min ?Charges:  OT General Charges ?$OT Visit: 1 Visit ?OT Evaluation ?$OT Eval Low Complexity: 1 Low ?OT Treatments ?$Self Care/Home Management : 8-22 mins ?Nestor Lewandowsky, OTR/L ?Acute Rehabilitation Services ?Pager: [phone] ?Office: [phone]  ? ?Malka So ?10/05/2021, 10:10 AM ?

## 2021-10-05 NOTE — Progress Notes (Signed)
Initial Nutrition Assessment ? ?DOCUMENTATION CODES:  ?Severe malnutrition in context of chronic illness ? ?INTERVENTION:  ?-Ensure Max po BID, each supplement provides 150 kcal and 30 grams of protein ?-MVI with minerals daily ? ?NUTRITION DIAGNOSIS:  ?Severe Malnutrition related to chronic illness (severe COPD, chronic dysphagia, suspected bronchogenic carcinoma) as evidenced by severe fat depletion, severe muscle depletion, percent weight loss. ? ?GOAL:  ?Patient will meet greater than or equal to 90% of their needs ? ?MONITOR:  ?PO intake, Supplement acceptance, Labs, Weight trends ? ?REASON FOR ASSESSMENT:  ?Consult ?Assessment of nutrition requirement/status ? ?ASSESSMENT:  ?Pt with PMH significant for RUL lung nodule (pt refused biopsy), chronic dysphagia w/ silent aspiration, COPD w/ chronic hypoxic respiratory failure on home O2 admitted w/ acute on chronic respiratory failure d/t mucous plugging causing R mid lung collapse and postobstructive PNA. ? ?Pt's lung nodule noted to be concerning for primary bronchogenic carcinoma. Pt c/o generalized weakness and debility. PMT following for ongoing [place] conversations.  ? ?Weight history reviewed. Pt with clinically significant 5.9% weight loss since 08/31/21 (see below). Pt is underweight based on BMI, cachectic, and noted to have severe muscle and fat depletions upon examination.  ? ?PO Intake: 75% x 1 recorded meal   ? ?UOP: 829ml x24 hours ?I/O: +1081ml since admit ? ?Medications: ? insulin aspart  0-6 Units Subcutaneous TID WC  ? insulin glargine-yfgn  8 Units Subcutaneous QHS  ? pantoprazole  40 mg Oral Q1200  ?IV abx ? ?Labs: ?Recent Labs  ?Lab 10/02/21 ?2035 10/04/21 ?9326 10/05/21 ?0715  ?NA 136 134* 133*  ?K 4.6 4.4 4.5  ?CL 104 106 107  ?CO2 25 21* 21*  ?BUN 25* 26* 23  ?CREATININE 1.43* 1.35* 1.30*  ?CALCIUM 9.1 8.5* 8.3*  ?GLUCOSE 146* 160* 123*  ?CBGs: 117-312 x24 hours ?  ?NUTRITION - FOCUSED PHYSICAL EXAM: ?Flowsheet Row Most Recent Value  ?Orbital  Region Severe depletion  ?Upper Arm Region Severe depletion  ?Thoracic and Lumbar Region Severe depletion  ?Buccal Region Severe depletion  ?Temple Region Severe depletion  ?Clavicle Bone Region Severe depletion  ?Clavicle and Acromion Bone Region Severe depletion  ?Scapular Bone Region Severe depletion  ?Dorsal Hand Severe depletion  ?Patellar Region Severe depletion  ?Anterior Thigh Region Severe depletion  ?Posterior Calf Region Severe depletion  ?Edema (RD Assessment) None  ?Hair Reviewed  ?Eyes Reviewed  ?Mouth Reviewed  ?Skin Reviewed  ?Nails Reviewed  ? ?Diet Order:   ?Diet Order   ? ?       ?  Diet Carb Modified Fluid consistency: Thin; Room service appropriate? Yes  Diet effective now       ?  ? ?  ?  ? ?  ? ?EDUCATION NEEDS:  ?No education needs have been identified at this time ? ?Skin:  Skin Assessment: Reviewed RN Assessment ? ?Last BM:  4/12 ? ?Height:  ?Ht Readings from Last 1 Encounters:  ?10/04/21 6\' 2"  (1.88 m)  ? ?Weight:  ?Wt Readings from Last 3 Encounters:  ?10/04/21 58.9 kg  ?08/31/21 62.6 kg  ?07/27/21 62.1 kg  ? ?BMI:  Body mass index is 16.67 kg/m?. ? ?Estimated Nutritional Needs:  ?Kcal:  2050-2250 ?Protein:  100-115 grams ?Fluid:  >2L ? ? ? ?Theone Stanley., MS, RD, LDN (she/her/hers) ?RD pager number and weekend/on-call pager number located in [place]. ?

## 2021-10-05 NOTE — Progress Notes (Signed)
?      ?                 PROGRESS NOTE ? ?      ?PATIENT DETAILS ?Name: [name] ?Age: 87 y.o. ?Sex: male ?Date of Birth: 1935/03/11 ?Admit Date: 10/02/2021 ?Admitting Physician Charlann Lange, MD ?[id number], Cammie Mcgee, MD ? ?Brief Summary: ?Patient is a 87 y.o.  male known RUL lung nodule (positive PET scan-he refused bronchoscopy/biopsy)-chronic dysphagia with silent aspiration, COPD with chronic hypoxic respiratory failure on home O2 who presented with progressively worsening shortness of breath, weakness.  He was found to have acute on chronic hypoxic respiratory failure due to mucous plugging causing right mid lung collapse and postobstructive pneumonia. ? ? ?Significant events: ?4/11>> admit to [facility] for progressive weakness/shortness of breath-mucous plugging/right mid lung collapse and postobstructive PNA. ? ?Significant studies: ?4/12 >>CTA chest: No PE, mucus impaction of bronchus intermedius resulting in right middle lobe collapse and postobstructive PNA.  Unchanged right upper lobe spiculated lung nodule concerning for primary bronchogenic carcinoma.  Severe COPD. ? ?Significant microbiology data: ?4/12>> COVID/influenza PCR: Negative ?4/12>> blood culture: Negative ? ?Procedures: ?None ? ?Consults: ?None  ? ?Subjective: ?Bringing up more phlegm today.  Still looks very weak and debilitated. ? ?Objective: ?Vitals: ?Blood pressure (!) 101/56, pulse 82, temperature 99.1 ?F (37.3 ?C), temperature source Oral, resp. rate 17, height 6\' 2"  (1.88 m), weight 58.9 kg, SpO2 100 %.  ? ?Exam: ?Gen Exam:Alert awake-not in any distress ?HEENT:atraumatic, normocephalic ?Chest: B/L clear to auscultation anteriorly ?CVS:S1S2 regular ?Abdomen:soft non tender, non distended ?Extremities:no edema ?Neurology: Non focal ?Skin: no rash  ? ?Pertinent Labs/Radiology: ? ?  Latest Ref Rng & Units 10/05/2021  ?  7:15 AM 10/04/2021  ?  5:16 AM 10/02/2021  ?  8:35 PM  ?CBC  ?WBC 4.0 - 10.5 K/uL 11.1   15.3   12.4    ?Hemoglobin 13.0 - 17.0  g/dL 10.5   11.4   12.5    ?Hematocrit 39.0 - 52.0 % 32.6   36.6   38.8    ?Platelets 150 - 400 K/uL 189   190   250    ?  ?Lab Results  ?Component Value Date  ? NA 133 (L) 10/05/2021  ? K 4.5 10/05/2021  ? CL 107 10/05/2021  ? CO2 21 (L) 10/05/2021  ? ?  ? ? ?Assessment/Plan: ?Acute on chronic hypoxic respiratory failure due to mucous plugging (?  Malignancy) causing right mid lung collapse and postobstructive PNA: Remained stable overnight-on 3 L of oxygen (home regimen 2 L)-bringing up more phlegm with initiation of Mucinex, Mucomyst nebs, bronchodilators and use of incentive spirometry/flutter valve.  Continue to mobilize-continue Unasyn.  ? ?Chronic dysphagia-documented silent aspiration in prior studies: Appreciate SLP evaluation-on regular solids/thin liquids.  ? ?History of RUL spiculated lung nodule-concerning for primary bronchogenic carcinoma: Known issue-positive PET scan in the past-patient refused further work-up including bronchoscopy/biopsy.  He appears incredibly cachectic-very frail and debilitated-suspect primary process driving debility/deconditioning is underlying cancer. ? ?CKD stage IIIb: Creatinine close to baseline ? ?COPD: Continue bronchodilators.  Not in exacerbation ? ?HLD: Continue statin ? ?DM-2 (A1c 7.5 on 2/3): CBGs relatively stable-continue Semglee 8 units daily and SSI. ? ?Recent Labs  ?  10/04/21 ?1638 10/04/21 ?2123 10/05/21 ?0240  ?GLUCAP 289* 143* 117*  ? ?  ?BPH: Continue Flomax ? ?Palliative care: DNR in place-discussed with granddaughter at bedside (on 4/14)-she understands poor overall prognosis-risk of continued medical decline-agreeable to set up hospice/palliative care.  Case management consulted. ? ?Nutrition Status: ?Nutrition Problem: Severe Malnutrition ?Etiology: chronic illness (severe COPD, chronic dysphagia, suspected bronchogenic carcinoma) ?Signs/Symptoms: severe fat depletion, severe muscle depletion, percent weight loss ?Percent weight loss: 5.9 % (x1  month) ?Interventions: Premier Protein, MVI ?  ? ?Underweight: ?Estimated body mass index is 16.67 kg/m? as calculated from the following: ?  Height as of this encounter: 6\' 2"  (1.88 m). ?  Weight as of this encounter: 58.9 kg.  ? ?Code status: ?  Code Status: DNR  ? ?DVT Prophylaxis: ?enoxaparin (LOVENOX) injection 40 mg Start: 10/03/21 1600 ?  ?Family Communication: Granddaughter/daughter-in-law at bedside. ? ? ?Disposition Plan: ?Status is: Inpatient ?Remains inpatient appropriate because: Postoperative PNA-mucoid impaction causing right middle lobe collapse.  On antibiotics-not yet stable for discharge. ?  ?Planned Discharge Destination:Home ? ? ?Diet: ?Diet Order   ? ?       ?  Diet Carb Modified Fluid consistency: Thin; Room service appropriate? Yes  Diet effective now       ?  ? ?  ?  ? ?  ?  ? ? ?Antimicrobial agents: ?Anti-infectives (From admission, onward)  ? ? Start     Dose/Rate Route Frequency Ordered Stop  ? 10/04/21 1600  vancomycin (VANCOREADY) IVPB 750 mg/150 mL  Status:  Discontinued       ? 750 mg ?150 mL/hr over 60 Minutes Intravenous Every 24 hours 10/03/21 1446 10/04/21 1054  ? 10/04/21 1145  Ampicillin-Sulbactam (UNASYN) 3 g in sodium chloride 0.9 % 100 mL IVPB       ? 3 g ?200 mL/hr over 30 Minutes Intravenous Every 8 hours 10/04/21 1132 10/07/21 2359  ? 10/03/21 1600  ceFEPIme (MAXIPIME) 2 g in sodium chloride 0.9 % 100 mL IVPB  Status:  Discontinued       ? 2 g ?200 mL/hr over 30 Minutes Intravenous Every 12 hours 10/03/21 1443 10/04/21 1054  ? 10/03/21 1515  vancomycin (VANCOREADY) IVPB 1250 mg/250 mL       ? 1,250 mg ?166.7 mL/hr over 90 Minutes Intravenous  Once 10/03/21 1443 10/03/21 1746  ? 10/03/21 1500  metroNIDAZOLE (FLAGYL) IVPB 500 mg  Status:  Discontinued       ? 500 mg ?100 mL/hr over 60 Minutes Intravenous Every 8 hours 10/03/21 1427 10/04/21 1054  ? 10/03/21 1330  cefTRIAXone (ROCEPHIN) 1 g in sodium chloride 0.9 % 100 mL IVPB       ? 1 g ?200 mL/hr over 30 Minutes  Intravenous  Once 10/03/21 1328 10/03/21 1515  ? 10/03/21 1330  azithromycin (ZITHROMAX) 500 mg in sodium chloride 0.9 % 250 mL IVPB       ? 500 mg ?250 mL/hr over 60 Minutes Intravenous  Once 10/03/21 1328 10/03/21 1737  ? ?  ? ? ? ?MEDICATIONS: ?Scheduled Meds: ? acetylcysteine  4 mL Nebulization BID  ? arformoterol  15 mcg Nebulization BID  ? [START ON 10/06/2021] aspirin EC  81 mg Oral Daily  ? enoxaparin (LOVENOX) injection  40 mg Subcutaneous Daily  ? guaiFENesin  1,200 mg Oral BID  ? insulin aspart  0-6 Units Subcutaneous TID WC  ? insulin glargine-yfgn  8 Units Subcutaneous QHS  ? ipratropium-albuterol  3 mL Nebulization BID  ? multivitamin with minerals  1 tablet Oral Daily  ? pantoprazole  40 mg Oral Q1200  ? pravastatin  40 mg Oral QHS  ? Ensure Max Protein  11 oz Oral BID  ? revefenacin  175 mcg Nebulization Daily  ? tamsulosin  0.4 mg Oral Daily  ? ?Continuous Infusions: ? ampicillin-sulbactam (UNASYN) IV 3 g (10/05/21 0240)  ? ?PRN Meds:.acetaminophen **OR** acetaminophen, albuterol, HYDROcodone-acetaminophen, hydrOXYzine, nitroGLYCERIN, ondansetron **OR** ondansetron (ZOFRAN) IV ? ? ?I have personally reviewed following labs and imaging studies ? ?LABORATORY DATA: ?CBC: ?Recent Labs  ?Lab 10/02/21 ?2035 10/04/21 ?7510 10/05/21 ?0715  ?WBC 12.4* 15.3* 11.1*  ?NEUTROABS 9.1*  --   --   ?HGB 12.5* 11.4* 10.5*  ?HCT 38.8* 36.6* 32.6*  ?MCV 89.4 91.0 88.1  ?PLT 250 190 189  ? ? ? ?Basic Metabolic Panel: ?Recent Labs  ?Lab 10/02/21 ?2035 10/04/21 ?2585 10/05/21 ?0715  ?NA 136 134* 133*  ?K 4.6 4.4 4.5  ?CL 104 106 107  ?CO2 25 21* 21*  ?GLUCOSE 146* 160* 123*  ?BUN 25* 26* 23  ?CREATININE 1.43* 1.35* 1.30*  ?CALCIUM 9.1 8.5* 8.3*  ? ? ? ?GFR: ?Estimated Creatinine Clearance: 35.2 mL/min (A) (by C-G formula based on SCr of 1.3 mg/dL (H)). ? ?Liver Function Tests: ?Recent Labs  ?Lab 10/02/21 ?2035  ?AST 15  ?ALT 9  ?ALKPHOS 54  ?BILITOT 0.7  ?PROT 8.1  ?ALBUMIN 3.5  ? ? ?No results for input(s): LIPASE,  AMYLASE in the last 168 hours. ?No results for input(s): AMMONIA in the last 168 hours. ? ?Coagulation Profile: ?No results for input(s): INR, PROTIME in the last 168 hours. ? ?Cardiac Enzymes: ?No results for input(s):

## 2021-10-06 DIAGNOSIS — J690 Pneumonitis due to inhalation of food and vomit: Secondary | ICD-10-CM | POA: Diagnosis not present

## 2021-10-06 DIAGNOSIS — N183 Chronic kidney disease, stage 3 unspecified: Secondary | ICD-10-CM | POA: Diagnosis not present

## 2021-10-06 DIAGNOSIS — J9601 Acute respiratory failure with hypoxia: Secondary | ICD-10-CM | POA: Diagnosis not present

## 2021-10-06 DIAGNOSIS — R1310 Dysphagia, unspecified: Secondary | ICD-10-CM | POA: Diagnosis not present

## 2021-10-06 LAB — BASIC METABOLIC PANEL
Anion gap: 6 (ref 5–15)
BUN: 26 mg/dL — ABNORMAL HIGH (ref 8–23)
CO2: 23 mmol/L (ref 22–32)
Calcium: 8.2 mg/dL — ABNORMAL LOW (ref 8.9–10.3)
Chloride: 104 mmol/L (ref 98–111)
Creatinine, Ser: 1.27 mg/dL — ABNORMAL HIGH (ref 0.61–1.24)
GFR, Estimated: 56 mL/min — ABNORMAL LOW (ref >60)
Glucose, Bld: 319 mg/dL — ABNORMAL HIGH (ref 70–99)
Potassium: 4.8 mmol/L (ref 3.5–5.1)
Sodium: 133 mmol/L — ABNORMAL LOW (ref 135–145)

## 2021-10-06 LAB — GLUCOSE, CAPILLARY
Glucose-Capillary: 218 mg/dL — ABNORMAL HIGH (ref 70–99)
Glucose-Capillary: 131 mg/dL — ABNORMAL HIGH (ref 70–99)
Glucose-Capillary: 246 mg/dL — ABNORMAL HIGH (ref 70–99)
Glucose-Capillary: 147 mg/dL — ABNORMAL HIGH (ref 70–99)

## 2021-10-06 LAB — CBC
HCT: 30.4 % — ABNORMAL LOW (ref 39.0–52.0)
Hemoglobin: 10.2 g/dL — ABNORMAL LOW (ref 13.0–17.0)
MCH: 29 pg (ref 26.0–34.0)
MCHC: 33.6 g/dL (ref 30.0–36.0)
MCV: 86.4 fL (ref 80.0–100.0)
Platelets: 202 10*3/uL (ref 150–400)
RBC: 3.52 MIL/uL — ABNORMAL LOW (ref 4.22–5.81)
RDW: 16.8 % — ABNORMAL HIGH (ref 11.5–15.5)
WBC: 8.3 10*3/uL (ref 4.0–10.5)
nRBC: 0 % (ref 0.0–0.2)

## 2021-10-06 MED ORDER — INSULIN GLARGINE-YFGN 100 UNIT/ML ~~LOC~~ SOLN
14.0000 [IU] | Freq: Every day | SUBCUTANEOUS | Status: DC
  Administered 2021-10-06 – 2021-10-07 (×2): 14 [IU] via SUBCUTANEOUS
  Filled 2021-10-06 (×3): qty 0.14

## 2021-10-06 NOTE — Progress Notes (Signed)
?      ?                 PROGRESS NOTE ? ?      ?PATIENT DETAILS ?Name: [name] ?Age: 87 y.o. ?Sex: male ?Date of Birth: [birth year] ?Admit Date: 10/02/2021 ?Admitting Physician Charlann Lange, MD ?[id number], Cammie Mcgee, MD ? ?Brief Summary: ?Patient is a 87 y.o.  male known RUL lung nodule (positive PET scan-he refused bronchoscopy/biopsy)-chronic dysphagia with silent aspiration, COPD with chronic hypoxic respiratory failure on home O2 who presented with progressively worsening shortness of breath, weakness.  He was found to have acute on chronic hypoxic respiratory failure due to mucous plugging causing right mid lung collapse and postobstructive pneumonia. ? ? ?Significant events: ?4/11>> admit to [facility] for progressive weakness/shortness of breath-mucous plugging/right mid lung collapse and postobstructive PNA. ? ?Significant studies: ?4/12 >>CTA chest: No PE, mucus impaction of bronchus intermedius resulting in right middle lobe collapse and postobstructive PNA.  Unchanged right upper lobe spiculated lung nodule concerning for primary bronchogenic carcinoma.  Severe COPD. ? ?Significant microbiology data: ?4/12>> COVID/influenza PCR: Negative ?4/12>> blood culture: Negative ? ?Procedures: ?None ? ?Consults: ?None  ? ?Subjective: ?Continues to feel better-bringing up more phlegm. ? ?Objective: ?Vitals: ?Blood pressure 129/62, pulse 80, temperature 98.2 ?F (36.8 ?C), temperature source Oral, resp. rate 18, height 6\' 2"  (1.88 m), weight 58.9 kg, SpO2 98 %.  ? ?Exam: ?Gen Exam:Alert awake-not in any distress ?HEENT:atraumatic, normocephalic ?Chest: B/L clear to auscultation anteriorly ?CVS:S1S2 regular ?Abdomen:soft non tender, non distended ?Extremities:no edema ?Neurology: Non focal ?Skin: no rash  ? ?Pertinent Labs/Radiology: ? ?  Latest Ref Rng & Units 10/06/2021  ?  1:46 AM 10/05/2021  ?  7:15 AM 10/04/2021  ?  5:16 AM  ?CBC  ?WBC 4.0 - 10.5 K/uL 8.3   11.1   15.3    ?Hemoglobin 13.0 - 17.0 g/dL 10.2   10.5   11.4     ?Hematocrit 39.0 - 52.0 % 30.4   32.6   36.6    ?Platelets 150 - 400 K/uL 202   189   190    ?  ?Lab Results  ?Component Value Date  ? NA 133 (L) 10/06/2021  ? K 4.8 10/06/2021  ? CL 104 10/06/2021  ? CO2 23 10/06/2021  ? ?  ? ? ?Assessment/Plan: ?Acute on chronic hypoxic respiratory failure due to mucous plugging (?  Malignancy) causing right mid lung collapse and postobstructive PNA: Improving-bringing up phlegm with use of bronchodilators/Mucinex/flutter/incentive spirometry.  Continue Unasyn-continue to mobilize-repeat two-view chest x-ray tomorrow morning.  Remains stable on just 2-3 L of oxygen. ? ?Chronic dysphagia-documented silent aspiration in prior studies: Appreciate SLP evaluation-on regular solids/thin liquids.  ? ?History of RUL spiculated lung nodule-concerning for primary bronchogenic carcinoma: Known issue-positive PET scan in the past-patient refused further work-up including bronchoscopy/biopsy.  He appears incredibly cachectic-very frail and debilitated-suspect primary process driving debility/deconditioning is underlying cancer. ? ?CKD stage IIIb: Creatinine close to baseline ? ?COPD: Continue bronchodilators.  Not in exacerbation ? ?HLD: Continue statin ? ?DM-2 (A1c 7.5 on 2/3): CBGs creeping up-increase Semglee to 14 units daily-continue SSI. ? ?Recent Labs  ?  10/05/21 ?1643 10/05/21 ?2110 10/06/21 ?0810  ?GLUCAP 184* 300* 218*  ? ?  ?BPH: Continue Flomax ? ?Palliative care: DNR in place-discussed with granddaughter at bedside (on 4/14)-she understands poor overall prognosis-risk of continued medical decline-agreeable to set up hospice/palliative care.  Case management consulted. ? ?Nutrition Status: ?Nutrition Problem: Severe Malnutrition ?Etiology: chronic  illness (severe COPD, chronic dysphagia, suspected bronchogenic carcinoma) ?Signs/Symptoms: severe fat depletion, severe muscle depletion, percent weight loss ?Percent weight loss: 5.9 % (x1 month) ?Interventions: Premier Protein, MVI ?   ? ?Underweight: ?Estimated body mass index is 16.67 kg/m? as calculated from the following: ?  Height as of this encounter: 6\' 2"  (1.88 m). ?  Weight as of this encounter: 58.9 kg.  ? ?Code status: ?  Code Status: DNR  ? ?DVT Prophylaxis: ?enoxaparin (LOVENOX) injection 40 mg Start: 10/03/21 1600 ?  ?Family Communication: Granddaughter-Amanda-[phone] updated over the phone on 4/15. ? ? ?Disposition Plan: ?Status is: Inpatient ?Remains inpatient appropriate because: Postoperative PNA-mucoid impaction causing right middle lobe collapse.  On antibiotics-not yet stable for discharge. ?  ?Planned Discharge Destination:Home ? ? ?Diet: ?Diet Order   ? ?       ?  Diet Carb Modified Fluid consistency: Thin; Room service appropriate? Yes  Diet effective now       ?  ? ?  ?  ? ?  ?  ? ? ?Antimicrobial agents: ?Anti-infectives (From admission, onward)  ? ? Start     Dose/Rate Route Frequency Ordered Stop  ? 10/04/21 1600  vancomycin (VANCOREADY) IVPB 750 mg/150 mL  Status:  Discontinued       ? 750 mg ?150 mL/hr over 60 Minutes Intravenous Every 24 hours 10/03/21 1446 10/04/21 1054  ? 10/04/21 1145  Ampicillin-Sulbactam (UNASYN) 3 g in sodium chloride 0.9 % 100 mL IVPB       ? 3 g ?200 mL/hr over 30 Minutes Intravenous Every 8 hours 10/04/21 1132 10/07/21 2359  ? 10/03/21 1600  ceFEPIme (MAXIPIME) 2 g in sodium chloride 0.9 % 100 mL IVPB  Status:  Discontinued       ? 2 g ?200 mL/hr over 30 Minutes Intravenous Every 12 hours 10/03/21 1443 10/04/21 1054  ? 10/03/21 1515  vancomycin (VANCOREADY) IVPB 1250 mg/250 mL       ? 1,250 mg ?166.7 mL/hr over 90 Minutes Intravenous  Once 10/03/21 1443 10/03/21 1746  ? 10/03/21 1500  metroNIDAZOLE (FLAGYL) IVPB 500 mg  Status:  Discontinued       ? 500 mg ?100 mL/hr over 60 Minutes Intravenous Every 8 hours 10/03/21 1427 10/04/21 1054  ? 10/03/21 1330  cefTRIAXone (ROCEPHIN) 1 g in sodium chloride 0.9 % 100 mL IVPB       ? 1 g ?200 mL/hr over 30 Minutes Intravenous  Once 10/03/21 1328  10/03/21 1515  ? 10/03/21 1330  azithromycin (ZITHROMAX) 500 mg in sodium chloride 0.9 % 250 mL IVPB       ? 500 mg ?250 mL/hr over 60 Minutes Intravenous  Once 10/03/21 1328 10/03/21 1737  ? ?  ? ? ? ?MEDICATIONS: ?Scheduled Meds: ? arformoterol  15 mcg Nebulization BID  ? aspirin EC  81 mg Oral Daily  ? enoxaparin (LOVENOX) injection  40 mg Subcutaneous Daily  ? guaiFENesin  1,200 mg Oral BID  ? insulin aspart  0-6 Units Subcutaneous TID WC  ? insulin glargine-yfgn  8 Units Subcutaneous QHS  ? ipratropium-albuterol  3 mL Nebulization BID  ? multivitamin with minerals  1 tablet Oral Daily  ? pantoprazole  40 mg Oral Q1200  ? pravastatin  40 mg Oral QHS  ? Ensure Max Protein  11 oz Oral BID  ? revefenacin  175 mcg Nebulization Daily  ? tamsulosin  0.4 mg Oral Daily  ? ?Continuous Infusions: ? ampicillin-sulbactam (UNASYN) IV Stopped (10/06/21 0344)  ? ?PRN  Meds:.acetaminophen **OR** acetaminophen, albuterol, HYDROcodone-acetaminophen, hydrOXYzine, nitroGLYCERIN, ondansetron **OR** ondansetron (ZOFRAN) IV ? ? ?I have personally reviewed following labs and imaging studies ? ?LABORATORY DATA: ?CBC: ?Recent Labs  ?Lab 10/02/21 ?2035 10/04/21 ?1103 10/05/21 ?0715 10/06/21 ?0146  ?WBC 12.4* 15.3* 11.1* 8.3  ?NEUTROABS 9.1*  --   --   --   ?HGB 12.5* 11.4* 10.5* 10.2*  ?HCT 38.8* 36.6* 32.6* 30.4*  ?MCV 89.4 91.0 88.1 86.4  ?PLT 250 190 189 202  ? ? ? ?Basic Metabolic Panel: ?Recent Labs  ?Lab 10/02/21 ?2035 10/04/21 ?1594 10/05/21 ?0715 10/06/21 ?0146  ?NA 136 134* 133* 133*  ?K 4.6 4.4 4.5 4.8  ?CL 104 106 107 104  ?CO2 25 21* 21* 23  ?GLUCOSE 146* 160* 123* 319*  ?BUN 25* 26* 23 26*  ?CREATININE 1.43* 1.35* 1.30* 1.27*  ?CALCIUM 9.1 8.5* 8.3* 8.2*  ? ? ? ?GFR: ?Estimated Creatinine Clearance: 36.1 mL/min (A) (by C-G formula based on SCr of 1.27 mg/dL (H)). ? ?Liver Function Tests: ?Recent Labs  ?Lab 10/02/21 ?2035  ?AST 15  ?ALT 9  ?ALKPHOS 54  ?BILITOT 0.7  ?PROT 8.1  ?ALBUMIN 3.5  ? ? ?No results for input(s): LIPASE,  AMYLASE in the last 168 hours. ?No results for input(s): AMMONIA in the last 168 hours. ? ?Coagulation Profile: ?No results for input(s): INR, PROTIME in the last 168 hours. ? ?Cardiac Enzymes: ?No

## 2021-10-06 NOTE — Progress Notes (Signed)
Pharmacy Antibiotic Note ? ?[name] is a 87 y.o. male admitted on 10/02/2021 presenting with SOB, weakness.  Pharmacy has been consulted for Unasyn dosing. ? ?He is on D4/5 of unasyn for PNA. Renal function is stable so we will sign off. ? ?Plan: ?Unasyn 3g IV q 8h ? ?Height: 6\' 2"  (188 cm) ?Weight: 58.9 kg (129 lb 13.6 oz) ?IBW/kg (Calculated) : 82.2 ? ?Temp (24hrs), Avg:98.6 ?F (37 ?C), Min:98.2 ?F (36.8 ?C), Max:99.1 ?F (37.3 ?C) ? ?Recent Labs  ?Lab 10/02/21 ?2035 10/03/21 ?1240 10/04/21 ?8315 10/05/21 ?0715 10/06/21 ?0146  ?WBC 12.4*  --  15.3* 11.1* 8.3  ?CREATININE 1.43*  --  1.35* 1.30* 1.27*  ?LATICACIDVEN 1.6 1.6  --   --   --   ? ?  ?Estimated Creatinine Clearance: 36.1 mL/min (A) (by C-G formula based on SCr of 1.27 mg/dL (H)).   ? ?Allergies  ?Allergen Reactions  ? Celebrex [Celecoxib] Rash and Other (See Comments)  ?  Blisters, also  ? Tape Other (See Comments)  ?  EKG leads leave red marks and PLEASE USE COBAN WRAP!! Skin tears and bruises easily  ? ?Onnie Boer, PharmD, BCIDP, AAHIVP, CPP ?Infectious Disease Pharmacist ?10/06/2021 12:20 PM ? ? ? ? ?

## 2021-10-07 ENCOUNTER — Inpatient Hospital Stay (HOSPITAL_COMMUNITY): Payer: Medicare HMO

## 2021-10-07 DIAGNOSIS — R1310 Dysphagia, unspecified: Secondary | ICD-10-CM | POA: Diagnosis not present

## 2021-10-07 DIAGNOSIS — N183 Chronic kidney disease, stage 3 unspecified: Secondary | ICD-10-CM | POA: Diagnosis not present

## 2021-10-07 DIAGNOSIS — J690 Pneumonitis due to inhalation of food and vomit: Secondary | ICD-10-CM | POA: Diagnosis not present

## 2021-10-07 DIAGNOSIS — J9601 Acute respiratory failure with hypoxia: Secondary | ICD-10-CM | POA: Diagnosis not present

## 2021-10-07 LAB — GLUCOSE, CAPILLARY
Glucose-Capillary: 112 mg/dL — ABNORMAL HIGH (ref 70–99)
Glucose-Capillary: 193 mg/dL — ABNORMAL HIGH (ref 70–99)
Glucose-Capillary: 279 mg/dL — ABNORMAL HIGH (ref 70–99)
Glucose-Capillary: 202 mg/dL — ABNORMAL HIGH (ref 70–99)

## 2021-10-07 NOTE — Progress Notes (Addendum)
?      ?                 PROGRESS NOTE ? ?      ?PATIENT DETAILS ?Name: [name] ?Age: 87 y.o. ?Sex: male ?Date of Birth: [birth year] ?Admit Date: 10/02/2021 ?Admitting Physician Charlann Lange, MD ?BTD:[id number], Cammie Mcgee, MD ? ?Brief Summary: ?Patient is a 87 y.o.  male known RUL lung nodule (positive PET scan-he refused bronchoscopy/biopsy)-chronic dysphagia with silent aspiration, COPD with chronic hypoxic respiratory failure on home O2 who presented with progressively worsening shortness of breath, weakness.  He was found to have acute on chronic hypoxic respiratory failure due to mucous plugging causing right mid lung collapse and postobstructive pneumonia. ? ? ?Significant events: ?4/11>> admit to [facility] for progressive weakness/shortness of breath-mucous plugging/right mid lung collapse and postobstructive PNA. ? ?Significant studies: ?4/12 >>CTA chest: No PE, mucus impaction of bronchus intermedius resulting in right middle lobe collapse and postobstructive PNA.  Unchanged right upper lobe spiculated lung nodule concerning for primary bronchogenic carcinoma.  Severe COPD. ? ?Significant microbiology data: ?4/12>> COVID/influenza PCR: Negative ?4/12>> blood culture: Negative ? ?Procedures: ?None ? ?Consults: ?None  ? ?Subjective: ?Bringing up more phlegm-walked in the hall yesterday.  Overall much improved. ? ?Objective: ?Vitals: ?Blood pressure 109/62, pulse 71, temperature 98.3 ?F (36.8 ?C), temperature source Oral, resp. rate 18, height 6\' 2"  (1.88 m), weight 58.9 kg, SpO2 98 %.  ? ?Exam: ?Gen Exam:Alert awake-not in any distress ?HEENT:atraumatic, normocephalic ?Chest: B/L clear to auscultation anteriorly ?CVS:S1S2 regular ?Abdomen:soft non tender, non distended ?Extremities:no edema ?Neurology: Non focal ?Skin: no rash  ? ?Pertinent Labs/Radiology: ? ?  Latest Ref Rng & Units 10/06/2021  ?  1:46 AM 10/05/2021  ?  7:15 AM 10/04/2021  ?  5:16 AM  ?CBC  ?WBC 4.0 - 10.5 K/uL 8.3   11.1   15.3    ?Hemoglobin 13.0 -  17.0 g/dL 10.2   10.5   11.4    ?Hematocrit 39.0 - 52.0 % 30.4   32.6   36.6    ?Platelets 150 - 400 K/uL 202   189   190    ?  ?Lab Results  ?Component Value Date  ? NA 133 (L) 10/06/2021  ? K 4.8 10/06/2021  ? CL 104 10/06/2021  ? CO2 23 10/06/2021  ? ?  ? ? ?Assessment/Plan: ?Acute on chronic hypoxic respiratory failure due to mucous plugging (?  Malignancy) causing right mid lung collapse and postobstructive PNA: Improving-able to bring up more phlegm with the use of incentive spirometry/flutter valve/Mucinex and bronchodilator regimen.  Repeat chest x-ray this morning RLL PNA-plan is to continue with Unasyn for 1 additional day before consideration of discharge.  Stable on 2 L of oxygen. ? ?Chronic dysphagia-documented silent aspiration in prior studies: Appreciate SLP evaluation-on regular solids/thin liquids.  ? ?History of RUL spiculated lung nodule-concerning for primary bronchogenic carcinoma: Known issue-positive PET scan in the past-patient refused further work-up including bronchoscopy/biopsy.  He appears incredibly cachectic-very frail and debilitated-suspect primary process driving debility/deconditioning is underlying cancer. ? ?CKD stage IIIb: Creatinine close to baseline ? ?COPD: Continue bronchodilators.  Not in exacerbation ? ?HLD: Continue statin ? ?DM-2 (A1c 7.5 on 2/3): CBGs creeping up-increase Semglee to 14 units daily-continue SSI. ? ?Recent Labs  ?  10/06/21 ?2122 10/07/21 ?0737 10/07/21 ?1137  ?GLUCAP 147* 112* 193*  ? ?  ?BPH: Continue Flomax ? ?Palliative care: DNR in place-discussed with granddaughter at bedside (on 4/14)-she understands poor overall prognosis-risk  of continued medical decline-agreeable to set up hospice/palliative care.  Case management consulted. ? ?Nutrition Status: ?Nutrition Problem: Severe Malnutrition ?Etiology: chronic illness (severe COPD, chronic dysphagia, suspected bronchogenic carcinoma) ?Signs/Symptoms: severe fat depletion, severe muscle depletion,  percent weight loss ?Percent weight loss: 5.9 % (x1 month) ?Interventions: Premier Protein, MVI ?  ? ?Underweight: ?Estimated body mass index is 16.67 kg/m? as calculated from the following: ?  Height as of this encounter: 6\' 2"  (1.88 m). ?  Weight as of this encounter: 58.9 kg.  ? ?Code status: ?  Code Status: DNR  ? ?DVT Prophylaxis: ?enoxaparin (LOVENOX) injection 40 mg Start: 10/03/21 1600 ?  ?Family Communication: Granddaughter-[phone] updated over the phone on 4/16. ? ? ?Disposition Plan: ?Status is: Inpatient ?Remains inpatient appropriate because: Postoperative PNA-mucoid impaction causing right middle lobe collapse.  On antibiotics-not yet stable for discharge. ?  ?Planned Discharge Destination:Home ? ? ?Diet: ?Diet Order   ? ?       ?  Diet Carb Modified Fluid consistency: Thin; Room service appropriate? Yes  Diet effective now       ?  ? ?  ?  ? ?  ?  ? ? ?Antimicrobial agents: ?Anti-infectives (From admission, onward)  ? ? Start     Dose/Rate Route Frequency Ordered Stop  ? 10/04/21 1600  vancomycin (VANCOREADY) IVPB 750 mg/150 mL  Status:  Discontinued       ? 750 mg ?150 mL/hr over 60 Minutes Intravenous Every 24 hours 10/03/21 1446 10/04/21 1054  ? 10/04/21 1145  Ampicillin-Sulbactam (UNASYN) 3 g in sodium chloride 0.9 % 100 mL IVPB       ? 3 g ?200 mL/hr over 30 Minutes Intravenous Every 8 hours 10/04/21 1132 10/07/21 2359  ? 10/03/21 1600  ceFEPIme (MAXIPIME) 2 g in sodium chloride 0.9 % 100 mL IVPB  Status:  Discontinued       ? 2 g ?200 mL/hr over 30 Minutes Intravenous Every 12 hours 10/03/21 1443 10/04/21 1054  ? 10/03/21 1515  vancomycin (VANCOREADY) IVPB 1250 mg/250 mL       ? 1,250 mg ?166.7 mL/hr over 90 Minutes Intravenous  Once 10/03/21 1443 10/03/21 1746  ? 10/03/21 1500  metroNIDAZOLE (FLAGYL) IVPB 500 mg  Status:  Discontinued       ? 500 mg ?100 mL/hr over 60 Minutes Intravenous Every 8 hours 10/03/21 1427 10/04/21 1054  ? 10/03/21 1330  cefTRIAXone (ROCEPHIN) 1 g in sodium  chloride 0.9 % 100 mL IVPB       ? 1 g ?200 mL/hr over 30 Minutes Intravenous  Once 10/03/21 1328 10/03/21 1515  ? 10/03/21 1330  azithromycin (ZITHROMAX) 500 mg in sodium chloride 0.9 % 250 mL IVPB       ? 500 mg ?250 mL/hr over 60 Minutes Intravenous  Once 10/03/21 1328 10/03/21 1737  ? ?  ? ? ? ?MEDICATIONS: ?Scheduled Meds: ? arformoterol  15 mcg Nebulization BID  ? aspirin EC  81 mg Oral Daily  ? enoxaparin (LOVENOX) injection  40 mg Subcutaneous Daily  ? guaiFENesin  1,200 mg Oral BID  ? insulin aspart  0-6 Units Subcutaneous TID WC  ? insulin glargine-yfgn  14 Units Subcutaneous QHS  ? ipratropium-albuterol  3 mL Nebulization BID  ? multivitamin with minerals  1 tablet Oral Daily  ? pantoprazole  40 mg Oral Q1200  ? pravastatin  40 mg Oral QHS  ? Ensure Max Protein  11 oz Oral BID  ? revefenacin  175 mcg Nebulization Daily  ?  tamsulosin  0.4 mg Oral Daily  ? ?Continuous Infusions: ? ampicillin-sulbactam (UNASYN) IV 3 g (10/07/21 1008)  ? ?PRN Meds:.acetaminophen **OR** acetaminophen, albuterol, HYDROcodone-acetaminophen, hydrOXYzine, nitroGLYCERIN, ondansetron **OR** ondansetron (ZOFRAN) IV ? ? ?I have personally reviewed following labs and imaging studies ? ?LABORATORY DATA: ?CBC: ?Recent Labs  ?Lab 10/02/21 ?2035 10/04/21 ?8299 10/05/21 ?0715 10/06/21 ?0146  ?WBC 12.4* 15.3* 11.1* 8.3  ?NEUTROABS 9.1*  --   --   --   ?HGB 12.5* 11.4* 10.5* 10.2*  ?HCT 38.8* 36.6* 32.6* 30.4*  ?MCV 89.4 91.0 88.1 86.4  ?PLT 250 190 189 202  ? ? ? ?Basic Metabolic Panel: ?Recent Labs  ?Lab 10/02/21 ?2035 10/04/21 ?3716 10/05/21 ?0715 10/06/21 ?0146  ?NA 136 134* 133* 133*  ?K 4.6 4.4 4.5 4.8  ?CL 104 106 107 104  ?CO2 25 21* 21* 23  ?GLUCOSE 146* 160* 123* 319*  ?BUN 25* 26* 23 26*  ?CREATININE 1.43* 1.35* 1.30* 1.27*  ?CALCIUM 9.1 8.5* 8.3* 8.2*  ? ? ? ?GFR: ?Estimated Creatinine Clearance: 36.1 mL/min (A) (by C-G formula based on SCr of 1.27 mg/dL (H)). ? ?Liver Function Tests: ?Recent Labs  ?Lab 10/02/21 ?2035  ?AST 15   ?ALT 9  ?ALKPHOS 54  ?BILITOT 0.7  ?PROT 8.1  ?ALBUMIN 3.5  ? ? ?No results for input(s): LIPASE, AMYLASE in the last 168 hours. ?No results for input(s): AMMONIA in the last 168 hours. ? ?Coagulation Profile:

## 2021-10-08 DIAGNOSIS — N183 Chronic kidney disease, stage 3 unspecified: Secondary | ICD-10-CM | POA: Diagnosis not present

## 2021-10-08 DIAGNOSIS — J690 Pneumonitis due to inhalation of food and vomit: Secondary | ICD-10-CM | POA: Diagnosis not present

## 2021-10-08 DIAGNOSIS — J9601 Acute respiratory failure with hypoxia: Secondary | ICD-10-CM | POA: Diagnosis not present

## 2021-10-08 DIAGNOSIS — J449 Chronic obstructive pulmonary disease, unspecified: Secondary | ICD-10-CM | POA: Diagnosis not present

## 2021-10-08 LAB — GLUCOSE, CAPILLARY: Glucose-Capillary: 148 mg/dL — ABNORMAL HIGH (ref 70–99)

## 2021-10-08 LAB — CULTURE, BLOOD (ROUTINE X 2)
Culture: NO GROWTH
Special Requests: ADEQUATE

## 2021-10-08 MED ORDER — IPRATROPIUM-ALBUTEROL 0.5-2.5 (3) MG/3ML IN SOLN: 3.0000 mL | Freq: Three times a day (TID) | RESPIRATORY_TRACT | 2 refills | Status: DC

## 2021-10-08 MED ORDER — GUAIFENESIN ER 600 MG PO TB12: 600.0000 mg | ORAL_TABLET | Freq: Two times a day (BID) | ORAL | 2 refills | Status: DC

## 2021-10-08 MED ORDER — SPIRIVA HANDIHALER 18 MCG IN CAPS: 18.0000 ug | ORAL_CAPSULE | Freq: Every day | RESPIRATORY_TRACT | 2 refills | Status: DC

## 2021-10-08 MED ORDER — AMOXICILLIN-POT CLAVULANATE 875-125 MG PO TABS: 1.0000 | ORAL_TABLET | Freq: Two times a day (BID) | ORAL | 0 refills | Status: AC

## 2021-10-08 MED ORDER — BUDESONIDE-FORMOTEROL FUMARATE 160-4.5 MCG/ACT IN AERO
2.0000 | INHALATION_SPRAY | Freq: Two times a day (BID) | RESPIRATORY_TRACT | 12 refills | Status: DC
Start: 2021-10-08 — End: 2022-06-14

## 2021-10-08 MED ORDER — ALBUTEROL SULFATE (2.5 MG/3ML) 0.083% IN NEBU: 2.5000 mg | INHALATION_SOLUTION | Freq: Four times a day (QID) | RESPIRATORY_TRACT | 12 refills | Status: DC | PRN

## 2021-10-08 NOTE — TOC Transition Note (Signed)
Transition of Care (TOC) - CM/SW Discharge Note ? ? ?Patient Details  ?Name: [name] ?MRN: [id number] ?Date of Birth: [birth year] ? ?Transition of Care (TOC) CM/SW Contact:  ?Cyndi Bender, RN ?Phone Number: ?10/08/2021, 12:04 PM ? ? ?Clinical Narrative:    ?Patient stable for discharge.  ?Orders for Home health PT/OT. Patient defers to [facility] to find highly rated [place] agency. Spoke to Dante with [facility] and referral accepted.  ?Granddaughter at bedside to transport home ?Address, Phone number and PCP verified.  ? ? ?Final next level of care: [place] ?Barriers to Discharge: Barriers Resolved ? ? ?Patient Goals and CMS Choice ?Patient states their goals for this hospitalization and ongoing recovery are:: return home ?CMS Medicare.gov Compare Post Acute Care list provided to:: Patient ?Choice offered to / list presented to : Patient ? ?Discharge Placement ?  ?           ? home ?  ?  ?  ? ?Discharge Plan and Services ?  ?  ?     HH      ?  ?  ?  ?  ?  ?HH Arranged: PT, OT ?[place] Agency: [facility] ?Date HH Agency Contacted: 10/08/21 ?Time [place]: 5208 ?Representative spoke with at [place]: Malachy Mood ? ?Social Determinants of Health (SDOH) Interventions ?  ? ? ?Readmission Risk Interventions ? ?  10/08/2021  ? 12:04 PM  ?Readmission Risk Prevention Plan  ?Post Dischage Appt Complete  ?Medication Screening Complete  ?Transportation Screening Complete  ? ? ? ? ? ?

## 2021-10-08 NOTE — Discharge Summary (Signed)
? ?PATIENT DETAILS ?Name: [name] ?Age: 87 y.o. ?Sex: male ?Date of Birth: [birth year] ?MRN: [id number]. ?Admitting Physician: Charlann Lange, MD ?DGL:[id number], Cammie Mcgee, MD ? ?Admit Date: 10/02/2021 ?Discharge date: 10/08/2021 ? ?Recommendations for Outpatient Follow-up:  ?Follow up with PCP in 1-2 weeks ?Please obtain CMP/CBC in one week ?PCP to consider initiation of home hospice care at some point. ? ?Admitted From:  ?Home ? ?Disposition: ?Home ?  ?Discharge Condition: ?fair ? ?CODE STATUS: ?  Code Status: DNR  ? ?Diet recommendation:  ?Diet Order   ? ?       ?  Diet - low sodium heart healthy       ?  ?  Diet Carb Modified       ?  ?  Diet Carb Modified Fluid consistency: Thin; Room service appropriate? Yes  Diet effective now       ?  ? ?  ?  ? ?  ?  ? ?Brief Summary: ?Patient is a 87 y.o.  male known RUL lung nodule (positive PET scan-he refused bronchoscopy/biopsy)-chronic dysphagia with silent aspiration, COPD with chronic hypoxic respiratory failure on home O2 who presented with progressively worsening shortness of breath, weakness.  He was found to have acute on chronic hypoxic respiratory failure due to mucous plugging causing right mid lung collapse and postobstructive pneumonia. ?  ?  ?Significant events: ?4/11>> admit to [facility] for progressive weakness/shortness of breath-mucous plugging/right mid lung collapse and postobstructive PNA. ?  ?Significant studies: ?4/12 >>CTA chest: No PE, mucus impaction of bronchus intermedius resulting in right middle lobe collapse and postobstructive PNA.  Unchanged right upper lobe spiculated lung nodule concerning for primary bronchogenic carcinoma.  Severe COPD. ?  ?Significant microbiology data: ?4/12>> COVID/influenza PCR: Negative ?4/12>> blood culture: Negative ?  ?Procedures: ?None ?  ?Consults: ?None  ? ?Brief Hospital Course: ?Acute on chronic hypoxic respiratory failure due to mucous plugging (?  Malignancy) causing right mid lung collapse and postobstructive PNA:  Treated with Unasyn/bronchodilators/Mucinex/nebulized Mucomyst-much better-able to bring up phlegm.  Ambulating in the hallway with family.  Remained stable on just 2 L of oxygen.  Since overall improved-plans are to discharge home today with Augmentin-and bronchodilator regimen.  Patient to follow-up with his PCP/primary pulmonologist-suspect that he will require initiation of hospice care at some point in time.  He likely has lung cancer-and has refused further work-up in the past.   ? ?Chronic dysphagia-documented silent aspiration in prior studies: Appreciate SLP evaluation-on regular solids/thin liquids.  ?  ?History of RUL spiculated lung nodule-concerning for primary bronchogenic carcinoma: Known issue-positive PET scan in the past-patient refused further work-up including bronchoscopy/biopsy.  He appears incredibly cachectic-very frail and debilitated-suspect primary process driving debility/deconditioning is underlying cancer. ?  ?CKD stage IIIb: Creatinine close to baseline ?  ?COPD: Continue bronchodilators.  Not in exacerbation ?  ?HLD: Continue statin ?  ?DM-2 (A1c 7.5 on 2/3): CBG relatively stable-plan is to resume his usual insulin regimen on discharge. ? ?BPH: Continue Flomax ? ?Tobacco abuse: Continues to smoke-counseled ?  ?Palliative care: DNR in place-discussed with granddaughter at bedside (on 4/14)-she understands poor overall prognosis-risk of continued medical decline-have consulted case management to see if they can have a discussion with family to see if they want home hospice or home health services. ? ?Nutrition Status: ?Nutrition Problem: Severe Malnutrition ?Etiology: chronic illness (severe COPD, chronic dysphagia, suspected bronchogenic carcinoma) ?Signs/Symptoms: severe fat depletion, severe muscle depletion, percent weight loss ?Percent weight loss: 5.9 % (x1 month) ?Interventions: Premier  Protein, MVI ?  ?Obesity: ?Estimated body mass index is 16.67 kg/m? as calculated from the  following: ?  Height as of this encounter: 6\' 2"  (1.88 m). ?  Weight as of this encounter: 58.9 kg.  ? ? ?Discharge Diagnoses:  ?Principal Problem: ?  Acute respiratory failure with hypoxia ([place]) ?Active Problems: ?  Cigarette smoker ?  Essential hypertension ?  Atrial flutter ([place]) ?  Dysphagia ?  COPD GOLD 0 still smoking  ?  Postobstructive pneumonia ?  Pulmonary nodule ?  CAD (coronary artery disease) ?  Aspiration pneumonia ([place]) ?  Chronic kidney disease (CKD), stage III (moderate) (H[facility]) ?  T2DM (type 2 diabetes mellitus) ([place]) ?  Protein-calorie malnutrition, severe ? ? ?Discharge Instructions: ? ?Activity:  ?As tolerated with Full fall precautions use walker/cane & assistance as needed ? ? ?Discharge Instructions   ? ? Call MD for:  difficulty breathing, headache or visual disturbances   Complete by: As directed ?  ? Diet - low sodium heart healthy   Complete by: As directed ?  ? Diet Carb Modified   Complete by: As directed ?  ? Discharge instructions   Complete by: As directed ?  ? Follow with Primary MD  Susy Frizzle, MD in 1-2 weeks ? ?Please get a complete blood count and chemistry panel checked by your Primary MD at your next visit, and again as instructed by your Primary MD. ? ?Get Medicines reviewed and adjusted: ?Please take all your medications with you for your next visit with your Primary MD ? ?Laboratory/radiological data: ?Please request your Primary MD to go over all hospital tests and procedure/radiological results at the follow up, please ask your Primary MD to get all Hospital records sent to his/her office. ? ?In some cases, they will be blood work, cultures and biopsy results pending at the time of your discharge. Please request that your primary care M.D. follows up on these results. ? ?Also Note the following: ?If you experience worsening of your admission symptoms, develop shortness of breath, life threatening emergency, suicidal or homicidal thoughts you must seek medical  attention immediately by calling 911 or calling your MD immediately  if symptoms less severe. ? ?You must read complete instructions/literature along with all the possible adverse reactions/side effects for all the Medicines you take and that have been prescribed to you. Take any new Medicines after you have completely understood and accpet all the possible adverse reactions/side effects.  ? ?Do not drive when taking Pain medications or sleeping medications (Benzodaizepines) ? ?Do not take more than prescribed Pain, Sleep and Anxiety Medications. It is not advisable to combine anxiety,sleep and pain medications without talking with your primary care practitioner ? ?Special Instructions: If you have smoked or chewed Tobacco  in the last 2 yrs please stop smoking, stop any regular Alcohol  and or any Recreational drug use. ? ?Wear Seat belts while driving. ? ?Please note: ?You were cared for by a hospitalist during your hospital stay. Once you are discharged, your primary care physician will handle any further medical issues. Please note that NO REFILLS for any discharge medications will be authorized once you are discharged, as it is imperative that you return to your primary care physician (or establish a relationship with a primary care physician if you do not have one) for your post hospital discharge needs so that they can reassess your need for medications and monitor your lab values.  ? Increase activity slowly   Complete by: As  directed ?  ? ?  ? ?Allergies as of 10/08/2021   ? ?   Reactions  ? Celebrex [celecoxib] Rash, Other (See Comments)  ? Blisters, also  ? Tape Other (See Comments)  ? EKG leads leave red marks and PLEASE USE COBAN WRAP!! Skin tears and bruises easily  ? ?  ? ?  ?Medication List  ?  ? ?TAKE these medications   ? ?acetaminophen 325 MG tablet ?Commonly known as: TYLENOL ?Take 325-650 mg by mouth every 6 (six) hours as needed for mild pain or headache. ?  ?albuterol (2.5 MG/3ML) 0.083%  nebulizer solution ?Commonly known as: PROVENTIL ?Take 3 mLs (2.5 mg total) by nebulization every 6 (six) hours as needed for wheezing or shortness of breath. ?  ?amoxicillin-clavulanate 875-125 MG tablet ?Commonly

## 2021-10-08 NOTE — Care Management Important Message (Signed)
Important Message ? ?Patient Details  ?Name: [name] ?MRN: [id number] ?Date of Birth: 03-25-1935 ? ? ?Medicare Important Message Given:  Yes ?Patient discharged prior to IM delivery will mail to the patient home address ? ? ? ?[name] ?10/08/2021, 2:25 PM ?

## 2021-10-09 ENCOUNTER — Telehealth: Payer: Self-pay

## 2021-10-09 NOTE — Telephone Encounter (Signed)
Transition Care Management Follow-up Telephone Call ?Date of discharge and from where: 10/08/21 [name] ?How have you been since you were released from the hospital? Pt's granddaughter [name] states pt is doing okay.  ?Any questions or concerns? No ? ?Items Reviewed: ?Did the pt receive and understand the discharge instructions provided? Yes  ?Medications obtained and verified? Yes  ?Other? No  ?Any new allergies since your discharge? No  ?Dietary orders reviewed? Yes ?Do you have support at home? Yes  ? ?Home Care and Equipment/Supplies: ?Were home health services ordered? yes ?If so, what is the name of the agency? [facility]  ?Has the agency set up a time to come to the patient's home? no ?Were any new equipment or medical supplies ordered?  No ?What is the name of the medical supply agency? N/A ?Were you able to get the supplies/equipment? not applicable ?Do you have any questions related to the use of the equipment or supplies? No ? ?Functional Questionnaire: (I = Independent and D = Dependent) ?ADLs: I ? ?Bathing/Dressing- I ? ?Meal Prep- I ? ?Eating- I ? ?Maintaining continence- I ? ?Transferring/Ambulation- I ? ?Managing Meds- I ? ?Follow up appointments reviewed: ? ?PCP Hospital f/u appt confirmed? Yes  Scheduled to see Dr. Dennard Schaumann on 10/15/2021 @ 2. ?[facility] f/u appt confirmed? Yes  Scheduled to see Dr. Kirk Ruths on June 2023 per granddaughter, [name]. ?Are transportation arrangements needed? No  ?If their condition worsens, is the pt aware to call PCP or go to the Emergency Dept.? Yes ?Was the patient provided with contact information for the PCP's office or ED? Yes ?Was to pt encouraged to call back with questions or concerns? Yes ? ?

## 2021-10-10 DIAGNOSIS — R1310 Dysphagia, unspecified: Secondary | ICD-10-CM | POA: Diagnosis not present

## 2021-10-10 DIAGNOSIS — K219 Gastro-esophageal reflux disease without esophagitis: Secondary | ICD-10-CM | POA: Diagnosis not present

## 2021-10-10 DIAGNOSIS — Z794 Long term (current) use of insulin: Secondary | ICD-10-CM | POA: Diagnosis not present

## 2021-10-10 DIAGNOSIS — N400 Enlarged prostate without lower urinary tract symptoms: Secondary | ICD-10-CM | POA: Diagnosis not present

## 2021-10-10 DIAGNOSIS — N1832 Chronic kidney disease, stage 3b: Secondary | ICD-10-CM | POA: Diagnosis not present

## 2021-10-10 DIAGNOSIS — E1140 Type 2 diabetes mellitus with diabetic neuropathy, unspecified: Secondary | ICD-10-CM | POA: Diagnosis not present

## 2021-10-10 DIAGNOSIS — J449 Chronic obstructive pulmonary disease, unspecified: Secondary | ICD-10-CM | POA: Diagnosis not present

## 2021-10-10 DIAGNOSIS — E1122 Type 2 diabetes mellitus with diabetic chronic kidney disease: Secondary | ICD-10-CM | POA: Diagnosis not present

## 2021-10-10 DIAGNOSIS — Z86718 Personal history of other venous thrombosis and embolism: Secondary | ICD-10-CM | POA: Diagnosis not present

## 2021-10-10 DIAGNOSIS — F17210 Nicotine dependence, cigarettes, uncomplicated: Secondary | ICD-10-CM | POA: Diagnosis not present

## 2021-10-10 DIAGNOSIS — J690 Pneumonitis due to inhalation of food and vomit: Secondary | ICD-10-CM | POA: Diagnosis not present

## 2021-10-10 DIAGNOSIS — R911 Solitary pulmonary nodule: Secondary | ICD-10-CM | POA: Diagnosis not present

## 2021-10-10 DIAGNOSIS — Z7951 Long term (current) use of inhaled steroids: Secondary | ICD-10-CM | POA: Diagnosis not present

## 2021-10-10 DIAGNOSIS — I129 Hypertensive chronic kidney disease with stage 1 through stage 4 chronic kidney disease, or unspecified chronic kidney disease: Secondary | ICD-10-CM | POA: Diagnosis not present

## 2021-10-10 DIAGNOSIS — J9621 Acute and chronic respiratory failure with hypoxia: Secondary | ICD-10-CM | POA: Diagnosis not present

## 2021-10-10 DIAGNOSIS — E43 Unspecified severe protein-calorie malnutrition: Secondary | ICD-10-CM | POA: Diagnosis not present

## 2021-10-10 DIAGNOSIS — E785 Hyperlipidemia, unspecified: Secondary | ICD-10-CM | POA: Diagnosis not present

## 2021-10-10 DIAGNOSIS — I2510 Atherosclerotic heart disease of native coronary artery without angina pectoris: Secondary | ICD-10-CM | POA: Diagnosis not present

## 2021-10-10 DIAGNOSIS — I4892 Unspecified atrial flutter: Secondary | ICD-10-CM | POA: Diagnosis not present

## 2021-10-15 ENCOUNTER — Ambulatory Visit (INDEPENDENT_AMBULATORY_CARE_PROVIDER_SITE_OTHER): Payer: Medicare HMO | Admitting: Family Medicine

## 2021-10-15 VITALS — BP 100/62 | HR 77 | Temp 99.4°F | Ht 74.0 in | Wt 132.0 lb

## 2021-10-15 DIAGNOSIS — E43 Unspecified severe protein-calorie malnutrition: Secondary | ICD-10-CM

## 2021-10-15 DIAGNOSIS — J189 Pneumonia, unspecified organism: Secondary | ICD-10-CM

## 2021-10-15 DIAGNOSIS — J439 Emphysema, unspecified: Secondary | ICD-10-CM

## 2021-10-15 DIAGNOSIS — J984 Other disorders of lung: Secondary | ICD-10-CM | POA: Diagnosis not present

## 2021-10-15 MED ORDER — NYSTATIN 100000 UNIT/ML MT SUSP: 5.0000 mL | Freq: Four times a day (QID) | OROMUCOSAL | 0 refills | Status: DC

## 2021-10-15 MED ORDER — LEVOFLOXACIN 500 MG PO TABS: 500.0000 mg | ORAL_TABLET | Freq: Every day | ORAL | 0 refills | Status: AC

## 2021-10-15 NOTE — Telephone Encounter (Signed)
Hydroxyzine HCL tab been approved  ? ?From 06/24/21 to 06/23/2022 ? ?

## 2021-10-15 NOTE — Progress Notes (Signed)
? ?Subjective:  ? ? Patient ID: [name], male    DOB: [birth year], 87 y.o.   MRN: [id number] ? ?HPI  ?Admit Date: 10/02/2021 ?Discharge date: 10/08/2021 ?  ?Recommendations for Outpatient Follow-up:  ?Follow up with PCP in 1-2 weeks ?Please obtain CMP/CBC in one week ?PCP to consider initiation of home hospice care at some point. ?  ?  ?Brief Summary: ?Patient is a 87 y.o.  male known RUL lung nodule (positive PET scan-he refused bronchoscopy/biopsy)-chronic dysphagia with silent aspiration, COPD with chronic hypoxic respiratory failure on home O2 who presented with progressively worsening shortness of breath, weakness.  He was found to have acute on chronic hypoxic respiratory failure due to mucous plugging causing right mid lung collapse and postobstructive pneumonia. ?  ?  ?Significant events: ?4/11>> admit to [facility] for progressive weakness/shortness of breath-mucous plugging/right mid lung collapse and postobstructive PNA. ?  ?Significant studies: ?4/12 >>CTA chest: No PE, mucus impaction of bronchus intermedius resulting in right middle lobe collapse and postobstructive PNA.  Unchanged right upper lobe spiculated lung nodule concerning for primary bronchogenic carcinoma.  Severe COPD. ?  ? ?  ?Brief Hospital Course: ?Acute on chronic hypoxic respiratory failure due to mucous plugging (?  Malignancy) causing right mid lung collapse and postobstructive PNA: Treated with Unasyn/bronchodilators/Mucinex/nebulized Mucomyst-much better-able to bring up phlegm.  Ambulating in the hallway with family.  Remained stable on just 2 L of oxygen.  Since overall improved-plans are to discharge home today with Augmentin-and bronchodilator regimen.  Patient to follow-up with his PCP/primary pulmonologist-suspect that he will require initiation of hospice care at some point in time.  He likely has lung cancer-and has refused further work-up in the past.   ?  ?Chronic dysphagia-documented silent aspiration in prior studies:  Appreciate SLP evaluation-on regular solids/thin liquids.  ?  ?History of RUL spiculated lung nodule-concerning for primary bronchogenic carcinoma: Known issue-positive PET scan in the past-patient refused further work-up including bronchoscopy/biopsy.  He appears incredibly cachectic-very frail and debilitated-suspect primary process driving debility/deconditioning is underlying cancer. ?  ?CKD stage IIIb: Creatinine close to baseline ?  ?COPD: Continue bronchodilators.  Not in exacerbation ?  ?HLD: Continue statin ?  ?DM-2 (A1c 7.5 on 2/3): CBG relatively stable-plan is to resume his usual insulin regimen on discharge. ?  ?BPH: Continue Flomax ?  ?Tobacco abuse: Continues to smoke-counseled ?  ?Palliative care: DNR in place-discussed with granddaughter at bedside (on 4/14)-she understands poor overall prognosis-risk of continued medical decline-have consulted case management to see if they can have a discussion with family to see if they want home hospice or home health services. ? ?10/15/21 ?Spent approximately 1 hour today with the patient and his granddaughter reviewing the situation.  We reviewed the CT images of the right upper lobe mass.  Also showed the right middle lobe collapse due to mucous plugging in the right bronchus.  The patient has had pneumonia multiple times within the last few months and his health continues to decline.  He continues to look frail and weak and lose weight.  So today we had a long discussion about goals of care.  I do not believe the patient could tolerate surgery to remove any malignancy from his lung.  Therefore his treatment would likely be radiation or chemotherapy.  I believe his life expectancy regardless of the cancer is 6 months to a year given his poor overall health and severe COPD.  Therefore I do not feel that treatment for any malignancy would likely extend  his life in a meaningful way.  It would also likely potentially add additional complications that would  compromise his quality of life.  We discussed this at length.  We also discussed that the next step will be to try to get a tissue biopsy through bronchoscopy with pulmonology.  At this point, the patient does not want to pursue a biopsy.  We also discussed hospice and their ability to help with symptom control at home.  Although not an issue at the present time.  I feel that the patient will likely get to that point where he may be experiencing hypoxia and respiratory distress and pain.  Therefore I believe it would be beneficial to get hospice involved earlier in a nonemergent fashion to try to have a plan in place for if the situation decompensates. ?  ?Past Medical History:  ?Diagnosis Date  ? Acute venous embolism and thrombosis of unspecified deep vessels of lower extremity   ? Atrial flutter ([place])   ? and SVT that is post ablation  ? CAD (coronary artery disease)   ? Chronic kidney disease   ? stones  ? Coronary atherosclerosis 11/19/2008  ? Qualifier: Diagnosis of  By: Stanford Breed, MD, Kandyce Rud   ? Dyslipidemia   ? GERD (gastroesophageal reflux disease)   ? HTN (hypertension)   ? Shortness of breath   ? on exertion  ? ?Past Surgical History:  ?Procedure Laterality Date  ? CARDIAC CATHETERIZATION    ? s/p cardiac catheterization and bypass surgery as well as EGD with dilation x3, left lower extremity vein stripping  ? CORONARY ARTERY BYPASS GRAFT    ? ESOPHAGOGASTRODUODENOSCOPY N/A 12/06/2013  ? Procedure: ESOPHAGOGASTRODUODENOSCOPY (EGD);  Surgeon: Lear Ng, MD;  Location: [facility];  Service: Endoscopy;  Laterality: N/A;  ? left knee anthroscopy    ? SECONDARY CLOSURE ARM    ? VIDEO BRONCHOSCOPY Bilateral 01/14/2018  ? Procedure: VIDEO BRONCHOSCOPY WITHOUT FLUORO;  Surgeon: Tanda Rockers, MD;  Location: [facility];  Service: Cardiopulmonary;  Laterality: Bilateral;  ? ?Current Outpatient Medications on File Prior to Visit  ?Medication Sig Dispense Refill  ? acetaminophen (TYLENOL)  325 MG tablet Take 325-650 mg by mouth every 6 (six) hours as needed for mild pain or headache.    ? albuterol (PROVENTIL) (2.5 MG/3ML) 0.083% nebulizer solution Take 3 mLs (2.5 mg total) by nebulization every 6 (six) hours as needed for wheezing or shortness of breath. 75 mL 12  ? amoxicillin-clavulanate (AUGMENTIN) 875-125 MG tablet Take 1 tablet by mouth 2 (two) times daily for 10 days. 4 tablet 0  ? aspirin 325 MG tablet Take 325 mg by mouth daily.    ? blood glucose meter kit and supplies Dispense based on patient and insurance preference. Use up to four times daily as directed. (FOR ICD-10 E10.9, E11.9). 1 each 0  ? budesonide-formoterol (SYMBICORT) 160-4.5 MCG/ACT inhaler Inhale 2 puffs into the lungs 2 (two) times daily. 1 each 12  ? clobetasol cream (TEMOVATE) 0.05 % Please specify directions, refills and quantity (Patient not taking: Reported on 10/03/2021) 1 g 0  ? clotrimazole (LOTRIMIN) 1 % cream Apply 1 application topically 2 (two) times daily. Apply to the head of the penis 30 g 3  ? glucose blood (ONETOUCH ULTRA) test strip USE TO CHECK BLOOD SUGAR 4 TO 6 TIMES DAILY. E11.9 100 strip 9  ? guaiFENesin (MUCINEX) 600 MG 12 hr tablet Take 1 tablet (600 mg total) by mouth 2 (two) times daily. 30 tablet  2  ? HYDROcodone-acetaminophen (NORCO) 5-325 MG tablet Take 1 tablet by mouth every 6 (six) hours as needed for moderate pain. 30 tablet 0  ? hydrOXYzine (ATARAX) 25 MG tablet TAKE 1 TABLET BY MOUTH 3 TIMES DAILY AS NEEDED FOR ITCHING. (Patient taking differently: Take 25 mg by mouth 3 (three) times daily as needed for itching.) 30 tablet 3  ? Insulin Syringe-Needle U-100 (BD INSULIN SYRINGE U/F) 31G X 5/16" 0.3 ML MISC USE WITH NOVOLIN TWICE DAY WITH A MEAL 100 each 5  ? ipratropium-albuterol (DUONEB) 0.5-2.5 (3) MG/3ML SOLN Take 3 mLs by nebulization 3 (three) times daily. 360 mL 2  ? nitroGLYCERIN (NITROSTAT) 0.4 MG SL tablet Place 0.4 mg under the tongue every 5 (five) minutes as needed for chest pain.     ? NOVOLIN 70/30 (70-30) 100 UNIT/ML injection INJECT 8 UNITS SQ IN THE MORNING AND INJECT 12 UNITS SQ AT BEDTIME (Patient taking differently: 8-12 Units See admin instructions. Inject 8 units into the skin in

## 2021-10-18 ENCOUNTER — Telehealth: Payer: Self-pay

## 2021-10-18 NOTE — Telephone Encounter (Signed)
Spoke with patient's granddaughter [name] and scheduled a Mychart Palliative Consult for 10/25/21 @ 2:30 PM.  ? ?Consent obtained; updated Netsmart, Team List and Epic.  ? ?

## 2021-10-19 ENCOUNTER — Other Ambulatory Visit: Payer: Self-pay | Admitting: Family Medicine

## 2021-10-19 DIAGNOSIS — R300 Dysuria: Secondary | ICD-10-CM

## 2021-10-19 DIAGNOSIS — E1140 Type 2 diabetes mellitus with diabetic neuropathy, unspecified: Secondary | ICD-10-CM

## 2021-10-22 ENCOUNTER — Telehealth: Payer: Medicare HMO

## 2021-10-22 ENCOUNTER — Telehealth: Payer: Self-pay | Admitting: *Deleted

## 2021-10-22 NOTE — Telephone Encounter (Signed)
?  Care Management  ? ?Follow Up Note ? ? ?10/22/2021 ?Name: [name] MRN: [id number] DOB: 1934/12/22 ? ? ?Referred by: Christopher Frizzle, MD ?Reason for referral : Chronic Care Management (COPD, DM2) ? ? ?Telephone call to patient's granddaughter [name] for scheduled telephone assessment, [name] states she is working with machinery at present and cannot talk, requests Consulting civil engineer call back another day. ? ?Follow Up Plan: Telephone follow up appointment with care management team member scheduled for: 10/29/21 ? ?[name] RNC, BSN ?RN Case Manager ?[place] ?[phone] ? ? ?

## 2021-10-25 ENCOUNTER — Telehealth: Payer: Medicare HMO | Admitting: Hospice

## 2021-10-25 DIAGNOSIS — J449 Chronic obstructive pulmonary disease, unspecified: Secondary | ICD-10-CM | POA: Diagnosis not present

## 2021-10-25 DIAGNOSIS — R531 Weakness: Secondary | ICD-10-CM

## 2021-10-25 DIAGNOSIS — J439 Emphysema, unspecified: Secondary | ICD-10-CM | POA: Diagnosis not present

## 2021-10-25 DIAGNOSIS — E119 Type 2 diabetes mellitus without complications: Secondary | ICD-10-CM | POA: Diagnosis not present

## 2021-10-25 DIAGNOSIS — Z515 Encounter for palliative care: Secondary | ICD-10-CM | POA: Diagnosis not present

## 2021-10-25 DIAGNOSIS — E440 Moderate protein-calorie malnutrition: Secondary | ICD-10-CM | POA: Diagnosis not present

## 2021-10-25 DIAGNOSIS — Z794 Long term (current) use of insulin: Secondary | ICD-10-CM

## 2021-10-25 NOTE — Progress Notes (Signed)
? ? ?Manufacturing engineer ?Community Palliative Care Consult Note ?Telephone: [phone]  ?Fax: [phone] ? ?PATIENT NAME: [name] ?[name] [id number] ?[id number] (home)  ?DOB: [birth year] ?MRN: [id number] ? ?PRIMARY CARE PROVIDER:    ?Christopher Frizzle, MD,  ?[address] [address] ?[phone] ? ?REFERRING PROVIDER:   ?Christopher Frizzle, MD ?[address] [place],  [place] [address] ?[phone] ? ?RESPONSIBLE PARTY:   Self/[name] ?[name] is HCPOA ?Contact Information   ? ? Name Relation Home Work Mobile  ? [name] Granddaughter   [phone]  ? [name] Son [phone]    ? ?  ? ? ?TELEHEALTH VISIT STATEMENT ?Due to the COVID-19 crisis, this visit was done via telemedicine from my office and it was initiated and consent by this patient and or family.  ?I connected with patient OR PROXY by a telephone/video  and verified that I am speaking with the correct person. I discussed the limitations of evaluation and management by telemedicine. The patient expressed understanding and agreed to proceed. ?Palliative Care was asked to follow this patient to address advance care planning, complex medical decision making and goals of care clarification. [name] is home with patient during visit. This is the initial visit.  ? ?  ASSESSMENT AND / RECOMMENDATIONS:  ? ?Advance Care Planning: Our advance care planning conversation included a discussion about:    ?The value and importance of advance care planning  ?Difference between Hospice and Palliative care ?Exploration of goals of care in the event of a sudden injury or illness  ?Identification and preparation of a healthcare agent  ?Review and updating or creation of an  advance directive document . ?Decision not to resuscitate or to de-escalate disease focused treatments due to poor prognosis. ? ?CODE STATUS:Patient is a Do Not Resuscitate ? ?Goals of Care: Goals include to  maximize quality of life and symptom management ? ?I spent  16 minutes providing this initial consultation. More than 50% of the time in this consultation was spent on counseling patient and coordinating communication. ?-------------------------------------------------------------------------------------------------------------------------------------- ? ?Symptom Management/Plan: ?COPD: Pulmonary Emphysema. Managed with Albuterol, Symbicort.  As needed oxygen supplementation for shortness of breath 2L/Min. Avoid triggers.  Pulmonologist consult as needed ?Type 2 DM: Managed with Insulin. Current A1c 7.31 Jul 2021. Repeat A1c.  ?Weakness: PT/OT is ongoing for strengthening and ambulation.  ?Dysphagia: Choking precautions . Swallow studies was done April 2023; patient continues on regular diet and thin liquids.  ?Protein Caloric Malnutrition: Height/weight - 6 feet 2 inches 132 Ibs. Albumin 3.5 10/02/21. Ensure BID.  Routine CBC CMP ?Follow up: Palliative care will continue to follow for complex medical decision making, advance care planning, and clarification of goals. Return 6 weeks or prn. Encouraged to call provider sooner with any concerns.  ? ?Family /Caregiver/Community Supports: Patient lives at home independently, grand daughter and other children live close. Strong family support system identified.  ? ?HOSPICE ELIGIBILITY/DIAGNOSIS: TBD ? ?Chief Complaint: Initial Palliative care visit ? ?HISTORY OF PRESENT ILLNESS:  [name] is a 87 y.o. year old male  with multiple morbidities requiring close monitoring and with high risk of complications and  mortality: COPD, type 2 diabetes mellitus, dysphagia, protein caloric malnutrition, generalized body weakness.  ?History obtained from review of EMR, discussion with primary team, caregiver, family and/or [name].  ?Review and summarization of Epic records shows history from other than patient. Rest of 10 point ROS asked and negative.  ?Independent  interpretation of tests and reviewed as needed, available labs, patient records, imaging, studies and related documents from the EMR. ? ?ROS ?General: NAD ?EYES: denies vision changes ?ENMT: Endorses occasional dysphagia ?Cardiovascular: denies chest pain/discomfort ?Pulmonary: denies cough, denies SOB ?Abdomen: endorses fair to good appetite, denies constipation/diarrhea ?GU: denies dysuria, urinary frequency ?MSK:  endorses weakness,  no falls reported ?Skin: denies rashes or wounds ?Neurological: denies pain, denies insomnia ?Psych: Endorses positive mood ?Heme/lymph/immuno: denies bruises, abnormal bleeding ? ? ?PAST MEDICAL HISTORY:  ?Active Ambulatory Problems  ?  Diagnosis Date Noted  ? DYSLIPIDEMIA 11/19/2008  ? Cigarette smoker 01/11/2010  ? Essential hypertension 11/19/2008  ? Atrial flutter ([place]) 11/19/2008  ? DVT 11/19/2008  ? ABDOMINAL BRUIT 01/11/2010  ? Fatigue 01/08/2011  ? Bruit 01/08/2011  ? Cerebrovascular disease 11/14/2011  ? Food impaction of esophagus 12/06/2013  ? Dysphagia 12/06/2013  ? Lung nodule 01/13/2015  ? DKA (diabetic ketoacidosis) ([place]) 07/29/2017  ? Depression 11/25/2017  ? Pulmonary infiltrates on CXR 01/08/2018  ? Pseudomonas pneumonia ([place]) 02/02/2018  ? Adult BMI <19 kg/sq m 02/02/2018  ? COPD GOLD 0 still smoking  06/07/2018  ? Nocturnal hypoxemia 09/05/2020  ? Acute respiratory failure with hypoxia ([place]) 10/03/2021  ? Postobstructive pneumonia 10/03/2021  ? Pulmonary nodule 10/03/2021  ? CAD (coronary artery disease) 10/03/2021  ? Aspiration pneumonia ([place]) 10/03/2021  ? Chronic kidney disease (CKD), stage III (moderate) ([place]) 10/03/2021  ? T2DM (type 2 diabetes mellitus) ([place]) 10/03/2021  ? Protein-calorie malnutrition, severe 10/05/2021  ? ?Resolved Ambulatory Problems  ?  Diagnosis Date Noted  ? Coronary atherosclerosis 11/19/2008  ? ?Past Medical History:  ?Diagnosis Date  ? Chronic kidney disease   ? Dyslipidemia   ? GERD (gastroesophageal reflux disease)   ? HTN  (hypertension)   ? Shortness of breath   ? ? ?SOCIAL HX:  ?Social History  ? ?Tobacco Use  ? Smoking status: Every Day  ?  Packs/day: 1.00  ?  Years: 60.00  ?  Pack years: 60.00  ?  Types: Cigarettes  ? Smokeless tobacco: Never  ?Substance Use Topics  ? Alcohol use: No  ? ?  ?FAMILY HX: No family history on file.   ? ?ALLERGIES:  ?Allergies  ?Allergen Reactions  ? Celebrex [Celecoxib] Rash and Other (See Comments)  ?  Blisters, also  ? Tape Other (See Comments)  ?  EKG leads leave red marks and PLEASE USE COBAN WRAP!! Skin tears and bruises easily  ?   ? ?PERTINENT MEDICATIONS:  ?Outpatient Encounter Medications as of 10/25/2021  ?Medication Sig  ? acetaminophen (TYLENOL) 325 MG tablet Take 325-650 mg by mouth every 6 (six) hours as needed for mild pain or headache.  ? albuterol (PROVENTIL) (2.5 MG/3ML) 0.083% nebulizer solution Take 3 mLs (2.5 mg total) by nebulization every 6 (six) hours as needed for wheezing or shortness of breath.  ? aspirin 325 MG tablet Take 325 mg by mouth daily.  ? blood glucose meter kit and supplies Dispense based on patient and insurance preference. Use up to four times daily as directed. (FOR ICD-10 E10.9, E11.9).  ? budesonide-formoterol (SYMBICORT) 160-4.5 MCG/ACT inhaler Inhale 2 puffs into the lungs 2 (two) times daily.  ? clobetasol cream (TEMOVATE) 0.05 % Please specify directions, refills and quantity  ? clotrimazole (LOTRIMIN) 1 % cream Apply 1 application topically 2 (two) times daily. Apply to the head of the penis  ? glucose blood (ONETOUCH ULTRA) test strip USE TO CHECK BLOOD SUGAR 4 TO 6 TIMES DAILY. E11.9  ?  guaiFENesin (MUCINEX) 600 MG 12 hr tablet Take 1 tablet (600 mg total) by mouth 2 (two) times daily.  ? HYDROcodone-acetaminophen (NORCO) 5-325 MG tablet Take 1 tablet by mouth every 6 (six) hours as needed for moderate pain.  ? hydrOXYzine (ATARAX) 25 MG tablet TAKE 1 TABLET BY MOUTH 3 TIMES DAILY AS NEEDED FOR ITCHING. (Patient taking differently: Take 25 mg by mouth 3  (three) times daily as needed for itching.)  ? Insulin Syringe-Needle U-100 (BD INSULIN SYRINGE U/F) 31G X 5/16" 0.3 ML MISC USE WITH NOVOLIN TWICE DAY WITH A MEAL  ? ipratropium-albuterol (DUONEB) 0.5-2.5 (3) MG/3ML SOLN

## 2021-10-26 ENCOUNTER — Telehealth: Payer: Self-pay

## 2021-10-26 ENCOUNTER — Encounter: Payer: Self-pay | Admitting: Family Medicine

## 2021-10-26 NOTE — Telephone Encounter (Signed)
Tammy, Physical Therapist with [facility] called in stating that pt did not want to participate in therapy today and that he didn't want to participate in the next scheduled visit he has with therapy. Tammy stated that this will be 2 missed visits of PT and she spoke with pt's granddaughter to see if this pt wanted to continue with therapy. Tammy wanted to make the pcp aware that pt is choosing not to participate with physical therapy. If pcp/or clinical staff has any question, please feel free to call Tammy Physical Therapist with [facility] at number provided. ? ?Cb#: Tammy PT [phone] ?

## 2021-10-29 ENCOUNTER — Ambulatory Visit (INDEPENDENT_AMBULATORY_CARE_PROVIDER_SITE_OTHER): Payer: Medicare HMO | Admitting: *Deleted

## 2021-10-29 DIAGNOSIS — J449 Chronic obstructive pulmonary disease, unspecified: Secondary | ICD-10-CM

## 2021-10-29 DIAGNOSIS — E1140 Type 2 diabetes mellitus with diabetic neuropathy, unspecified: Secondary | ICD-10-CM

## 2021-10-29 NOTE — Patient Instructions (Signed)
Visit Information ? ?Thank you for taking time to visit with me today. Please don't hesitate to contact me if I can be of assistance to you before our next scheduled telephone appointment. ? ?Following are the goals we discussed today:  ?Take medications as prescribed   ?Attend all scheduled provider appointments ?Call provider office for new concerns or questions  ?check blood sugar at prescribed times: twice daily ?enter blood sugar readings and medication or insulin into daily log ?take the blood sugar log to all doctor visits ?take the blood sugar meter to all doctor visits ?fill half of plate with vegetables ?prepare main meal at home 3 to 5 days each week ?read food labels for fat, fiber, carbohydrates and portion size ?wear comfortable, cotton socks ?wear comfortable, well-fitting shoes ?eliminate smoking in my home ?follow rescue plan if symptoms flare-up ?get at least 7 to 8 hours of sleep at night ?do breathing exercises every day ?Practice good handwashing, wear a mask as needed- avoid sick people ?Follow carbohydrate modified diet- be careful of intake of rice, potatoes, bread, pasta ?Avoid concentrated sweets ?Do not ever smoke around oxygen ?Use oxygen exactly as directed ?Drink adequate fluids  ?Eat adequate fiber in diet to help prevent constipation-  whole grains, fruit and vegetables ? ?Our next appointment is by telephone on 01/14/22 at 1045 am ? ?Please call the care guide team at [phone] if you need to cancel or reschedule your appointment.  ? ?If you are experiencing a Mental Health or [place] or need someone to talk to, please call the Suicide and Crisis Lifeline: 988 ?call the [place] National Suicide Prevention Lifeline: 845-573-9063 or TTY: 478-212-1815 TTY [phone]) to talk to a trained counselor ?call 1-800-273-TALK (toll free, 24 hour hotline) ?go to [facility] [address], [place] [phone]) ?call 911  ? ?Patient  verbalizes understanding of instructions and care plan provided today and agrees to view in [place]. Active MyChart status confirmed with patient.   ? ?Jacqlyn Larsen RNC, BSN ?RN Case Manager ?[place] ?[phone] ? ?

## 2021-10-29 NOTE — Chronic Care Management (AMB) (Signed)
?Chronic Care Management  ? ?CCM RN Visit Note ? ?10/29/2021 ?Name: [name] MRN: [id number] DOB: Apr 17, 1935 ? ?Subjective: ?[name] is a 87 y.o. year old male who is a primary care patient of Pickard, Cammie Mcgee, MD. The care management team was consulted for assistance with disease management and care coordination needs.   ? ?Engaged with patient by telephone for follow up visit in response to provider referral for case management and/or care coordination services.  ? ?Consent to Services:  ?The patient was given information about Chronic Care Management services, agreed to services, and gave verbal consent prior to initiation of services.  Please see initial visit note for detailed documentation.  ? ?Patient agreed to services and verbal consent obtained.  ? ?Assessment: Review of patient past medical history, allergies, medications, health status, including review of consultants reports, laboratory and other test data, was performed as part of comprehensive evaluation and provision of chronic care management services.  ? ?SDOH (Social Determinants of Health) assessments and interventions performed:   ? ?CCM Care Plan ? ?Allergies  ?Allergen Reactions  ? Celebrex [Celecoxib] Rash and Other (See Comments)  ?  Blisters, also  ? Tape Other (See Comments)  ?  EKG leads leave red marks and PLEASE USE COBAN WRAP!! Skin tears and bruises easily  ? ? ?Outpatient Encounter Medications as of 10/29/2021  ?Medication Sig  ? acetaminophen (TYLENOL) 325 MG tablet Take 325-650 mg by mouth every 6 (six) hours as needed for mild pain or headache.  ? albuterol (PROVENTIL) (2.5 MG/3ML) 0.083% nebulizer solution Take 3 mLs (2.5 mg total) by nebulization every 6 (six) hours as needed for wheezing or shortness of breath.  ? aspirin 325 MG tablet Take 325 mg by mouth daily.  ? blood glucose meter kit and supplies Dispense based on patient and insurance preference. Use up to four times daily as directed. (FOR ICD-10 E10.9,  E11.9).  ? budesonide-formoterol (SYMBICORT) 160-4.5 MCG/ACT inhaler Inhale 2 puffs into the lungs 2 (two) times daily.  ? clobetasol cream (TEMOVATE) 0.05 % Please specify directions, refills and quantity  ? clotrimazole (LOTRIMIN) 1 % cream Apply 1 application topically 2 (two) times daily. Apply to the head of the penis  ? glucose blood (ONETOUCH ULTRA) test strip USE TO CHECK BLOOD SUGAR 4 TO 6 TIMES DAILY. E11.9  ? guaiFENesin (MUCINEX) 600 MG 12 hr tablet Take 1 tablet (600 mg total) by mouth 2 (two) times daily.  ? HYDROcodone-acetaminophen (NORCO) 5-325 MG tablet Take 1 tablet by mouth every 6 (six) hours as needed for moderate pain.  ? hydrOXYzine (ATARAX) 25 MG tablet TAKE 1 TABLET BY MOUTH 3 TIMES DAILY AS NEEDED FOR ITCHING. (Patient taking differently: Take 25 mg by mouth 3 (three) times daily as needed for itching.)  ? Insulin Syringe-Needle U-100 (BD INSULIN SYRINGE U/F) 31G X 5/16" 0.3 ML MISC USE WITH NOVOLIN TWICE DAY WITH A MEAL  ? ipratropium-albuterol (DUONEB) 0.5-2.5 (3) MG/3ML SOLN Take 3 mLs by nebulization 3 (three) times daily.  ? nitroGLYCERIN (NITROSTAT) 0.4 MG SL tablet Place 0.4 mg under the tongue every 5 (five) minutes as needed for chest pain.  ? NOVOLIN 70/30 (70-30) 100 UNIT/ML injection INJECT 8 UNITS SQ IN THE MORNING AND INJECT 12 UNITS SQ AT BEDTIME (Patient taking differently: 8-12 Units See admin instructions. Inject 8 units into the skin in the morning and 12 units at bedtime)  ? nystatin (MYCOSTATIN) 100000 UNIT/ML suspension Take 5 mLs (500,000 Units total) by mouth 4 (  four) times daily.  ? OneTouch Delica Lancets 67R MISC USE TO CHECK BLOOD SUAGR 3 TIMES A DAY DX:E11.9  ? OXYGEN Inhale 4 L/min into the lungs at bedtime.  ? pantoprazole (PROTONIX) 40 MG tablet TAKE 1 TABLET BY MOUTH EVERY DAY (Patient taking differently: Take 40 mg by mouth daily before breakfast.)  ? pravastatin (PRAVACHOL) 40 MG tablet Take 1 tablet (40 mg total) by mouth daily. (Patient taking  differently: Take 40 mg by mouth at bedtime.)  ? tamsulosin (FLOMAX) 0.4 MG CAPS capsule TAKE 1 CAPSULE BY MOUTH EVERY DAY  ? tiotropium (SPIRIVA HANDIHALER) 18 MCG inhalation capsule Place 1 capsule (18 mcg total) into inhaler and inhale daily.  ? triamcinolone cream (KENALOG) 0.1 % APPLY TO AFFECTED AREA TWICE A DAY (Patient taking differently: Apply 1 application. topically See admin instructions. APPLY TO AFFECTED AREA TWICE A DAY)  ? ?No facility-administered encounter medications on file as of 10/29/2021.  ? ? ?Patient Active Problem List  ? Diagnosis Date Noted  ? Protein-calorie malnutrition, severe 10/05/2021  ? Acute respiratory failure with hypoxia ([place]) 10/03/2021  ? Postobstructive pneumonia 10/03/2021  ? Pulmonary nodule 10/03/2021  ? CAD (coronary artery disease) 10/03/2021  ? Aspiration pneumonia ([place]) 10/03/2021  ? Chronic kidney disease (CKD), stage III (moderate) ([place]) 10/03/2021  ? T2DM (type 2 diabetes mellitus) ([place]) 10/03/2021  ? Nocturnal hypoxemia 09/05/2020  ? COPD GOLD 0 still smoking  06/07/2018  ? Pseudomonas pneumonia ([place]) 02/02/2018  ? Adult BMI <19 kg/sq m 02/02/2018  ? Pulmonary infiltrates on CXR 01/08/2018  ? Depression 11/25/2017  ? DKA (diabetic ketoacidosis) ([place]) 07/29/2017  ? Lung nodule 01/13/2015  ? Food impaction of esophagus 12/06/2013  ? Dysphagia 12/06/2013  ? Cerebrovascular disease 11/14/2011  ? Fatigue 01/08/2011  ? Bruit 01/08/2011  ? Cigarette smoker 01/11/2010  ? ABDOMINAL BRUIT 01/11/2010  ? DYSLIPIDEMIA 11/19/2008  ? Essential hypertension 11/19/2008  ? Atrial flutter ([place]) 11/19/2008  ? DVT 11/19/2008  ? ? ?Conditions to be addressed/monitored:COPD and DMII ? ?Care Plan : RN Care Manager Plan of Care  ?Updates made by Kassie Mends, RN since 10/29/2021 12:00 AM  ?  ? ?Problem: No plan of care established for management of chronic disease states  (COPD, DM2)   ?Priority: High  ?  ? ?Long-Range Goal: Development of plan of care for chronic disease management (COPD,  DM2)   ?Start Date: 06/08/2021  ?Expected End Date: 04/27/2022  ?Priority: High  ?Note:   ?Current Barriers:  ?Knowledge Deficits related to plan of care for management of COPD and DMII  ?Knowledge Deficits related to basic Diabetes pathophysiology and self care/management-  Pt lives alone, is independent with ADL, IADL's, continues to drive, 2 adult sons live 5 minutes away, patient continues to smoke 1 pack per day and not interested in quitting at present.  Patient does not adhere to any special diet, eats out alot for companionship and to get out of the house.  Patient checks CBG BID and granddaughter unsure of the readings and state " I think they've been normal" granddaughter reports has all medications and taking as prescribed, recent constipation resolved, pt is now drinking one Ensure daily. ?Does not adhere to provider recommendations re: eats a regular diet ?Knowledge deficits related to basic understanding of COPD disease process- patient/ caregivers can benefit from reinforcement / management of COPD, medications. ?Knowledge deficits related to basic COPD self care/management- needs teaching/ reinforcement COPD action plan, pt uses oxygen at hs and prn ?Granddaughter [name] reports pt  recently finished antibiotic for pneumonia, has "spot in lung that doctor feels like may be causing issues" states pt is not having biopsy done due to his age he decided against.  Patient does not have pulse oximeter and does not monitor 02 saturation. ?Palliative outreached patient on 10/25/21 by video visit ? ?RNCM Clinical Goal(s):  ?Patient will verbalize understanding of plan for management of COPD and DMII as evidenced by patient report, review of EHR and  through collaboration with RN Care manager, provider, and care team.  ? ?Interventions: ?1:1 collaboration with primary care provider regarding development and update of comprehensive plan of care as evidenced by provider attestation and  co-signature ?Inter-disciplinary care team collaboration (see longitudinal plan of care) ?Evaluation of current treatment plan related to  self management and patient's adherence to plan as established by provider ? ? ?COPD Interventions:  (

## 2021-11-01 ENCOUNTER — Telehealth: Payer: Self-pay

## 2021-11-01 DIAGNOSIS — Z0000 Encounter for general adult medical examination without abnormal findings: Secondary | ICD-10-CM

## 2021-11-01 NOTE — Telephone Encounter (Signed)
Tammy Barlett-PTA w/[facility] stated that requesting for a nursing evaluation for disease management for pt ? ? ?Fax order to [phone] Herschell Dimes ? ?

## 2021-11-16 ENCOUNTER — Telehealth: Payer: Self-pay

## 2021-11-16 NOTE — Telephone Encounter (Signed)
[name] w/[facility]  Requesting to decrease his home physical therapy, to every other week for two week  Requesting to get nursing evaluation

## 2021-11-21 DIAGNOSIS — J449 Chronic obstructive pulmonary disease, unspecified: Secondary | ICD-10-CM | POA: Diagnosis not present

## 2021-11-21 DIAGNOSIS — R69 Illness, unspecified: Secondary | ICD-10-CM | POA: Diagnosis not present

## 2021-11-21 DIAGNOSIS — E1159 Type 2 diabetes mellitus with other circulatory complications: Secondary | ICD-10-CM

## 2021-11-21 DIAGNOSIS — Z794 Long term (current) use of insulin: Secondary | ICD-10-CM | POA: Diagnosis not present

## 2021-11-21 DIAGNOSIS — F17210 Nicotine dependence, cigarettes, uncomplicated: Secondary | ICD-10-CM

## 2021-11-22 ENCOUNTER — Telehealth: Payer: Medicare HMO | Admitting: Hospice

## 2021-11-25 DIAGNOSIS — J449 Chronic obstructive pulmonary disease, unspecified: Secondary | ICD-10-CM | POA: Diagnosis not present

## 2021-11-26 ENCOUNTER — Telehealth: Payer: Medicare HMO | Admitting: Hospice

## 2021-11-26 DIAGNOSIS — Z515 Encounter for palliative care: Secondary | ICD-10-CM

## 2021-11-26 DIAGNOSIS — M545 Low back pain, unspecified: Secondary | ICD-10-CM | POA: Diagnosis not present

## 2021-11-26 DIAGNOSIS — R1310 Dysphagia, unspecified: Secondary | ICD-10-CM

## 2021-11-26 DIAGNOSIS — R531 Weakness: Secondary | ICD-10-CM

## 2021-11-26 DIAGNOSIS — E440 Moderate protein-calorie malnutrition: Secondary | ICD-10-CM | POA: Diagnosis not present

## 2021-11-26 DIAGNOSIS — J439 Emphysema, unspecified: Secondary | ICD-10-CM

## 2021-11-26 DIAGNOSIS — G8929 Other chronic pain: Secondary | ICD-10-CM

## 2021-11-26 NOTE — Progress Notes (Signed)
[facility] Consult Note Telephone: [phone]  Fax: [phone]  PATIENT NAME: [name] [id number] [phone] (home)  DOB: 02-Oct-1934 MRN: [id number]  PRIMARY CARE PROVIDER:    Susy Frizzle, MD,  [address] [place] [address] [phone]  REFERRING PROVIDER:   Susy Frizzle, MD [address] [place],  [place] [address] [phone]  RESPONSIBLE PARTY:   [name] is [facility] Contact Information     Name Relation Home Work Mobile   [place] Granddaughter   [phone]   Rehan, Holness [phone]         TELEHEALTH VISIT STATEMENT Due to the COVID-19 crisis, this visit was done via telemedicine from my office and it was initiated and consent by this patient and or family.  I connected with patient OR PROXY by a telephone/video  and verified that I am speaking with the correct person. I discussed the limitations of evaluation and management by telemedicine. The patient expressed understanding and agreed to proceed. Palliative Care was asked to follow this patient to address advance care planning, complex medical decision making and goals of care clarification. [name] is home with patient during visit. This is a follow up visit.     ASSESSMENT AND / RECOMMENDATIONS:   CODE STATUS:Patient is a Do Not Resuscitate  Goals of Care: Goals include to maximize quality of life and symptom management  Symptom Management/Plan: COPD: Pulmonary Emphysema. No exacerbation since last visit. Continue Albuterol, Symbicort, prn oxygen supplementation for shortness of breath 2L/Min. Avoid triggers. Encourage deep slow breathing. Pulmonologist consult as needed Type 2 DM: Continue Insulin. Current A1c 7.31 Jul 2021. Repeat A1c.  Weakness: Ambulatory. PT/OT is ongoing for strengthening and ambulation.  Dysphagia: Stable. Continue to observe Choking  precautions. Swallow studies was done April 2023; patient continues on regular diet and thin liquids.  Protein Caloric Malnutrition: Height/weight - 6 feet 2 inches 132 Ibs. Albumin 3.5 10/02/21. Ensure BID. Check weight weekly and keep record. Routine CBC CMP. Low back pain: Managed with prn Hydrocodone. Not taken in a week.   Follow up: Palliative care will continue to follow for complex medical decision making, advance care planning, and clarification of goals. Return 6 weeks or prn. Encouraged to call provider sooner with any concerns.   Family /Caregiver/Community Supports: Patient lives at home independently, grand daughter and other children live close. Strong family support system identified.   HOSPICE ELIGIBILITY/DIAGNOSIS: TBD  Chief Complaint: Follow up visit  HISTORY OF PRESENT ILLNESS:  [name] is a 87 y.o. year old male  with multiple morbidities requiring close monitoring and with high risk of complications and  mortality: COPD, type 2 diabetes mellitus, dysphagia, protein caloric malnutrition, generalized body weakness.  History obtained from review of EMR, discussion with primary team, caregiver, family and/or [name].  Review and summarization of Epic records shows history from other than patient. Rest of 10 point ROS asked and negative.  Independent interpretation of tests and reviewed as needed, available labs, patient records, imaging, studies and related documents from the EMR.   PAST MEDICAL HISTORY:  Active Ambulatory Problems    Diagnosis Date Noted   DYSLIPIDEMIA 11/19/2008   Cigarette smoker 01/11/2010   Essential hypertension 11/19/2008   Atrial flutter ([place]) 11/19/2008   DVT 11/19/2008   ABDOMINAL BRUIT 01/11/2010   Fatigue 01/08/2011   Bruit 01/08/2011   Cerebrovascular disease 11/14/2011   Food impaction of  esophagus 12/06/2013   Dysphagia 12/06/2013   Lung nodule 01/13/2015   DKA (diabetic ketoacidosis) ([place]) 07/29/2017   Depression  11/25/2017   Pulmonary infiltrates on CXR 01/08/2018   Pseudomonas pneumonia ([place]) 02/02/2018   Adult BMI <19 kg/sq m 02/02/2018   COPD GOLD 0 still smoking  06/07/2018   Nocturnal hypoxemia 09/05/2020   Acute respiratory failure with hypoxia ([place]) 10/03/2021   Postobstructive pneumonia 10/03/2021   Pulmonary nodule 10/03/2021   CAD (coronary artery disease) 10/03/2021   Aspiration pneumonia ([place]) 10/03/2021   Chronic kidney disease (CKD), stage III (moderate) ([place]) 10/03/2021   T2DM (type 2 diabetes mellitus) ([place]) 10/03/2021   Protein-calorie malnutrition, severe 10/05/2021   Resolved Ambulatory Problems    Diagnosis Date Noted   Coronary atherosclerosis 11/19/2008   Past Medical History:  Diagnosis Date   Chronic kidney disease    Dyslipidemia    GERD (gastroesophageal reflux disease)    HTN (hypertension)    Shortness of breath     SOCIAL HX:  Social History   Tobacco Use   Smoking status: Every Day    Packs/day: 1.00    Years: 60.00    Pack years: 60.00    Types: Cigarettes   Smokeless tobacco: Never  Substance Use Topics   Alcohol use: No     FAMILY HX: No family history on file.    ALLERGIES:  Allergies  Allergen Reactions   Celebrex [Celecoxib] Rash and Other (See Comments)    Blisters, also   Tape Other (See Comments)    EKG leads leave red marks and PLEASE USE COBAN WRAP!! Skin tears and bruises easily      PERTINENT MEDICATIONS:  Outpatient Encounter Medications as of 11/26/2021  Medication Sig   acetaminophen (TYLENOL) 325 MG tablet Take 325-650 mg by mouth every 6 (six) hours as needed for mild pain or headache.   albuterol (PROVENTIL) (2.5 MG/3ML) 0.083% nebulizer solution Take 3 mLs (2.5 mg total) by nebulization every 6 (six) hours as needed for wheezing or shortness of breath.   aspirin 325 MG tablet Take 325 mg by mouth daily.   blood glucose meter kit and supplies Dispense based on patient and insurance preference. Use up to four times daily  as directed. (FOR ICD-10 E10.9, E11.9).   budesonide-formoterol (SYMBICORT) 160-4.5 MCG/ACT inhaler Inhale 2 puffs into the lungs 2 (two) times daily.   clobetasol cream (TEMOVATE) 0.05 % Please specify directions, refills and quantity   clotrimazole (LOTRIMIN) 1 % cream Apply 1 application topically 2 (two) times daily. Apply to the head of the penis   glucose blood (ONETOUCH ULTRA) test strip USE TO CHECK BLOOD SUGAR 4 TO 6 TIMES DAILY. E11.9   guaiFENesin (MUCINEX) 600 MG 12 hr tablet Take 1 tablet (600 mg total) by mouth 2 (two) times daily.   HYDROcodone-acetaminophen (NORCO) 5-325 MG tablet Take 1 tablet by mouth every 6 (six) hours as needed for moderate pain.   hydrOXYzine (ATARAX) 25 MG tablet TAKE 1 TABLET BY MOUTH 3 TIMES DAILY AS NEEDED FOR ITCHING. (Patient taking differently: Take 25 mg by mouth 3 (three) times daily as needed for itching.)   Insulin Syringe-Needle U-100 (BD INSULIN SYRINGE U/F) 31G X 5/16" 0.3 ML MISC USE WITH NOVOLIN TWICE DAY WITH A MEAL   ipratropium-albuterol (DUONEB) 0.5-2.5 (3) MG/3ML SOLN Take 3 mLs by nebulization 3 (three) times daily.   nitroGLYCERIN (NITROSTAT) 0.4 MG SL tablet Place 0.4 mg under the tongue every 5 (five) minutes as needed for chest pain.  NOVOLIN 70/30 (70-30) 100 UNIT/ML injection INJECT 8 UNITS SQ IN THE MORNING AND INJECT 12 UNITS SQ AT BEDTIME (Patient taking differently: 8-12 Units See admin instructions. Inject 8 units into the skin in the morning and 12 units at bedtime)   nystatin (MYCOSTATIN) 100000 UNIT/ML suspension Take 5 mLs (500,000 Units total) by mouth 4 (four) times daily.   OneTouch Delica Lancets 14H MISC USE TO CHECK BLOOD SUAGR 3 TIMES A DAY DX:E11.9   OXYGEN Inhale 4 L/min into the lungs at bedtime.   pantoprazole (PROTONIX) 40 MG tablet TAKE 1 TABLET BY MOUTH EVERY DAY (Patient taking differently: Take 40 mg by mouth daily before breakfast.)   pravastatin (PRAVACHOL) 40 MG tablet Take 1 tablet (40 mg total) by mouth  daily. (Patient taking differently: Take 40 mg by mouth at bedtime.)   tamsulosin (FLOMAX) 0.4 MG CAPS capsule TAKE 1 CAPSULE BY MOUTH EVERY DAY   tiotropium (SPIRIVA HANDIHALER) 18 MCG inhalation capsule Place 1 capsule (18 mcg total) into inhaler and inhale daily.   triamcinolone cream (KENALOG) 0.1 % APPLY TO AFFECTED AREA TWICE A DAY (Patient taking differently: Apply 1 application. topically See admin instructions. APPLY TO AFFECTED AREA TWICE A DAY)   No facility-administered encounter medications on file as of 11/26/2021.   I spent 40 minutes providing this consultation; this includes time spent with patient/family, chart review and documentation. More than 50% of the time in this consultation was spent on counseling and coordinating communication   Thank you for the opportunity to participate in the care of [name].  The palliative care team will continue to follow. Please call our office at [phone] if we can be of additional assistance.   Note: Portions of this note were generated with [employer]. Dictation errors may occur despite best attempts at proofreading.  Teodoro Spray, NP

## 2021-12-03 DIAGNOSIS — E119 Type 2 diabetes mellitus without complications: Secondary | ICD-10-CM | POA: Diagnosis not present

## 2021-12-03 DIAGNOSIS — H02135 Senile ectropion of left lower eyelid: Secondary | ICD-10-CM | POA: Diagnosis not present

## 2021-12-03 DIAGNOSIS — Z9842 Cataract extraction status, left eye: Secondary | ICD-10-CM | POA: Diagnosis not present

## 2021-12-03 DIAGNOSIS — H02132 Senile ectropion of right lower eyelid: Secondary | ICD-10-CM | POA: Diagnosis not present

## 2021-12-03 DIAGNOSIS — H52213 Irregular astigmatism, bilateral: Secondary | ICD-10-CM | POA: Diagnosis not present

## 2021-12-03 DIAGNOSIS — H353131 Nonexudative age-related macular degeneration, bilateral, early dry stage: Secondary | ICD-10-CM | POA: Diagnosis not present

## 2021-12-03 DIAGNOSIS — Z9841 Cataract extraction status, right eye: Secondary | ICD-10-CM | POA: Diagnosis not present

## 2021-12-03 LAB — HM DIABETES EYE EXAM

## 2021-12-14 ENCOUNTER — Ambulatory Visit: Payer: Self-pay

## 2021-12-14 NOTE — Telephone Encounter (Addendum)
  Chief Complaint: vomiting blood Symptoms: vomiting blood d/t Mucinex pill had got stuck in throat, been taking for several days in a row now, sore throat Frequency: blood just this morning 1 time Pertinent Negatives: NA Disposition: [] ED /[x] Urgent Care (no appt availability in office) / [] Appointment(In office/virtual)/ []  [facility]/ [] Home Care/ [] Refused Recommended Disposition /[] [facility]/ []  Follow-up with PCP Additional Notes: spoke with pt's granddaughter. She said pt has been having trouble taking Mucinex pill and gets hung in throat but this morning had blood in vomit when he threw pill back up. Only occurred once and pt was able to eat and drink after just has sore throat. Advised her since no appts available he can go to UC or do virtual UC visit. Also recommended salt water gargles and sore spray or logenzes to help with sore throat. She also is going to switch to liquid Mucinex instead of the pill. Advised to call back or go to ED if blood occurs again. She verbalized understanding.   Reason for Disposition  [1] Few streaks of blood in vomit AND [2] occurred one time  Answer Assessment - Initial Assessment Questions 1. APPEARANCE of BLOOD: "What does the blood look like?" (e.g., color, coffee-grounds)     Large amount of blood  3. VOMITING BLOOD: "How many times did it happen?" or "How many times in the past 24 hours?"     Just 1 4. VOMITING WITHOUT BLOOD: "How many times in the past 24 hours?"      Just d/t pill getting stuck  5. ONSET: "When did vomiting of blood begin?"     today 6. CAUSE: "What do you think is causing the vomiting of blood?"     Mucinex pill got stuck in throat  7. BLOOD THINNERS: "Do you take any blood thinners?" (e.g., Coumadin/warfarin, Pradaxa/dabigatran, aspirin)     no 8. DEHYDRATION: "Are there any signs of dehydration?" "When was the last time you urinated?" "Do you feel dizzy?"     no 11. OTHER SYMPTOMS: "Do you have  any other symptoms?" (e.g., fever, blood in stool)  Protocols used: Vomiting Blood-A-AH

## 2021-12-25 DIAGNOSIS — J449 Chronic obstructive pulmonary disease, unspecified: Secondary | ICD-10-CM | POA: Diagnosis not present

## 2022-01-07 ENCOUNTER — Other Ambulatory Visit: Payer: Self-pay | Admitting: Family Medicine

## 2022-01-07 ENCOUNTER — Telehealth: Payer: Self-pay

## 2022-01-07 MED ORDER — HYDROCODONE-ACETAMINOPHEN 5-325 MG PO TABS: 1.0000 | ORAL_TABLET | Freq: Four times a day (QID) | ORAL | 0 refills | Status: DC | PRN

## 2022-01-07 NOTE — Telephone Encounter (Signed)
Pt came in needing a refill of HYDROcodone-acetaminophen (NORCO) 5-325 MG tablet [[id number]] sent to the [facility] on [address]   LOV: 10/29/21

## 2022-01-09 ENCOUNTER — Other Ambulatory Visit: Payer: Self-pay | Admitting: Family Medicine

## 2022-01-10 NOTE — Telephone Encounter (Signed)
Requested Prescriptions  Pending Prescriptions Disp Refills  . 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

## 2022-01-14 ENCOUNTER — Ambulatory Visit (INDEPENDENT_AMBULATORY_CARE_PROVIDER_SITE_OTHER): Payer: Medicare HMO | Admitting: *Deleted

## 2022-01-14 DIAGNOSIS — J449 Chronic obstructive pulmonary disease, unspecified: Secondary | ICD-10-CM

## 2022-01-14 DIAGNOSIS — E1140 Type 2 diabetes mellitus with diabetic neuropathy, unspecified: Secondary | ICD-10-CM

## 2022-01-14 NOTE — Chronic Care Management (AMB) (Signed)
Chronic Care Management   CCM RN Visit Note  01/14/2022 Name: [name] MRN: [id number] DOB: Sep 12, 1934  Subjective: [name] is a 87 y.o. year old male who is a primary care patient of Pickard, Cammie Mcgee, MD. The care management team was consulted for assistance with disease management and care coordination needs.    Engaged with patient by telephone for follow up visit in response to provider referral for case management and/or care coordination services.   Consent to Services:  The patient was given information about Chronic Care Management services, agreed to services, and gave verbal consent prior to initiation of services.  Please see initial visit note for detailed documentation.   Patient agreed to services and verbal consent obtained.   Assessment: Review of patient past medical history, allergies, medications, health status, including review of consultants reports, laboratory and other test data, was performed as part of comprehensive evaluation and provision of chronic care management services.   SDOH (Social Determinants of Health) assessments and interventions performed:    CCM Care Plan  Allergies  Allergen Reactions   Celebrex [Celecoxib] Rash and Other (See Comments)    Blisters, also   Tape Other (See Comments)    EKG leads leave red marks and PLEASE USE COBAN WRAP!! Skin tears and bruises easily    Outpatient Encounter Medications as of 01/14/2022  Medication Sig   acetaminophen (TYLENOL) 325 MG tablet Take 325-650 mg by mouth every 6 (six) hours as needed for mild pain or headache.   albuterol (PROVENTIL) (2.5 MG/3ML) 0.083% nebulizer solution Take 3 mLs (2.5 mg total) by nebulization every 6 (six) hours as needed for wheezing or shortness of breath.   aspirin 325 MG tablet Take 325 mg by mouth daily.   blood glucose meter kit and supplies Dispense based on patient and insurance preference. Use up to four times daily as directed. (FOR ICD-10 E10.9,  E11.9).   budesonide-formoterol (SYMBICORT) 160-4.5 MCG/ACT inhaler Inhale 2 puffs into the lungs 2 (two) times daily.   clobetasol cream (TEMOVATE) 0.05 % Please specify directions, refills and quantity   clotrimazole (LOTRIMIN) 1 % cream Apply 1 application topically 2 (two) times daily. Apply to the head of the penis   glucose blood (ONETOUCH ULTRA) test strip USE TO CHECK BLOOD SUGAR 4 TO 6 TIMES DAILY. E11.9   guaiFENesin (MUCINEX) 600 MG 12 hr tablet Take 1 tablet (600 mg total) by mouth 2 (two) times daily.   HYDROcodone-acetaminophen (NORCO) 5-325 MG tablet Take 1 tablet by mouth every 6 (six) hours as needed for moderate pain.   hydrOXYzine (ATARAX) 25 MG tablet TAKE 1 TABLET BY MOUTH 3 TIMES DAILY AS NEEDED FOR ITCHING. (Patient taking differently: Take 25 mg by mouth 3 (three) times daily as needed for itching.)   Insulin Syringe-Needle U-100 (BD INSULIN SYRINGE U/F) 31G X 5/16" 0.3 ML MISC USE WITH NOVOLIN TWICE DAY WITH A MEAL   ipratropium-albuterol (DUONEB) 0.5-2.5 (3) MG/3ML SOLN Take 3 mLs by nebulization 3 (three) times daily.   nitroGLYCERIN (NITROSTAT) 0.4 MG SL tablet Place 0.4 mg under the tongue every 5 (five) minutes as needed for chest pain.   NOVOLIN 70/30 (70-30) 100 UNIT/ML injection INJECT 8 UNITS SQ IN THE MORNING AND INJECT 12 UNITS SQ AT BEDTIME (Patient taking differently: 8-12 Units See admin instructions. Inject 8 units into the skin in the morning and 12 units at bedtime)   nystatin (MYCOSTATIN) 100000 UNIT/ML suspension Take 5 mLs (500,000 Units total) by mouth 4 (  four) times daily.   OneTouch Delica Lancets 62H MISC USE TO CHECK BLOOD SUAGR 3 TIMES A DAY DX:E11.9   OXYGEN Inhale 4 L/min into the lungs at bedtime.   pantoprazole (PROTONIX) 40 MG tablet TAKE 1 TABLET BY MOUTH EVERY DAY   pravastatin (PRAVACHOL) 40 MG tablet Take 1 tablet (40 mg total) by mouth daily. (Patient taking differently: Take 40 mg by mouth at bedtime.)   tamsulosin (FLOMAX) 0.4 MG CAPS  capsule TAKE 1 CAPSULE BY MOUTH EVERY DAY   tiotropium (SPIRIVA HANDIHALER) 18 MCG inhalation capsule Place 1 capsule (18 mcg total) into inhaler and inhale daily.   triamcinolone cream (KENALOG) 0.1 % APPLY TO AFFECTED AREA TWICE A DAY (Patient taking differently: Apply 1 application  topically See admin instructions. APPLY TO AFFECTED AREA TWICE A DAY)   No facility-administered encounter medications on file as of 01/14/2022.    Patient Active Problem List   Diagnosis Date Noted   Protein-calorie malnutrition, severe 10/05/2021   Acute respiratory failure with hypoxia (HCC) 10/03/2021   Postobstructive pneumonia 10/03/2021   Pulmonary nodule 10/03/2021   CAD (coronary artery disease) 10/03/2021   Aspiration pneumonia ([place]) 10/03/2021   Chronic kidney disease (CKD), stage III (moderate) ([place]) 10/03/2021   T2DM (type 2 diabetes mellitus) ([place]) 10/03/2021   Nocturnal hypoxemia 09/05/2020   COPD GOLD 0 still smoking  06/07/2018   Pseudomonas pneumonia ([place]) 02/02/2018   Adult BMI <19 kg/sq m 02/02/2018   Pulmonary infiltrates on CXR 01/08/2018   Depression 11/25/2017   DKA (diabetic ketoacidosis) ([place]) 07/29/2017   Lung nodule 01/13/2015   Food impaction of esophagus 12/06/2013   Dysphagia 12/06/2013   Cerebrovascular disease 11/14/2011   Fatigue 01/08/2011   Bruit 01/08/2011   Cigarette smoker 01/11/2010   ABDOMINAL BRUIT 01/11/2010   DYSLIPIDEMIA 11/19/2008   Essential hypertension 11/19/2008   Atrial flutter ([place]) 11/19/2008   DVT 11/19/2008    Conditions to be addressed/monitored:COPD and DMII  Care Plan : RN Care Manager Plan of Care  Updates made by Kassie Mends, RN since 01/14/2022 12:00 AM  Completed 01/14/2022   Problem: No plan of care established for management of chronic disease states  (COPD, DM2) Resolved 01/14/2022  Priority: High     Long-Range Goal: Development of plan of care for chronic disease management (COPD, DM2) Completed 01/14/2022  Start Date:  06/08/2021  Expected End Date: 04/27/2022  Priority: High  Note:   Current Barriers:  Knowledge Deficits related to plan of care for management of COPD and DMII  Knowledge Deficits related to basic Diabetes pathophysiology and self care/management-  Pt lives alone, is independent with ADL, IADL's, continues to drive, 2 adult sons live 5 minutes away, patient continues to smoke 1 pack per day and not interested in quitting at present.  Patient does not adhere to any special diet, eats out alot for companionship and to get out of the house.  Patient checks CBG BID and granddaughter unsure of the readings and state " I don't know what the recent readings are" granddaughter reports has all medications and taking as prescribed,  pt is now drinking one Ensure daily. Does not adhere to provider recommendations re: eats a regular diet Knowledge deficits related to basic understanding of COPD disease process Knowledge deficits related to basic COPD self care/management- pt uses oxygen at hs and prn Granddaughter [name] reports pt has "spot in lung that doctor feels like may be causing issues" states pt is not having biopsy done due to his age  he decided against.  Patient does not have pulse oximeter and does not monitor 02 saturation. Palliative outreached patient on 11/26/21 by video visit, patient cancelled any further home health visits, no new concerns reported today  RNCM Clinical Goal(s):  Patient will verbalize understanding of plan for management of COPD and DMII as evidenced by patient report, review of EHR and  through collaboration with RN Care manager, provider, and care team.   Interventions: 1:1 collaboration with primary care provider regarding development and update of comprehensive plan of care as evidenced by provider attestation and co-signature Inter-disciplinary care team collaboration (see longitudinal plan of care) Evaluation of current treatment plan related to  self management and  patient's adherence to plan as established by provider   COPD Interventions:  (Status:  Goal on track:  Yes.) Long Term Goal Advised patient to track and manage COPD triggers Advised patient to self assesses COPD action plan zone and make appointment with provider if in the yellow zone for 48 hours without improvement Provided education about and advised patient to utilize infection prevention strategies to reduce risk of respiratory infection Discussed the importance of adequate rest and management of fatigue with COPD Encouraged smoking cessation Reviewed medications and importance of taking as prescribed Reviewed signs/ symptoms infection Reviewed importance of using nebulizer medication as prescribed  Encouraged continuing to work with Palliative Care Reviewed plan of care with granddaughter including case closure today   Diabetes Interventions:  (Status:  Goal on track:  Yes.) Long Term Goal Assessed patient's understanding of A1c goal: <7% Reviewed medications with patient and discussed importance of medication adherence Counseled on importance of regular laboratory monitoring as prescribed Review of patient status, including review of consultants reports, relevant laboratory and other test results, and medications completed Reinforced carbohydrate modified diet and nutritious food choices Reinforced importance of consuming adequate intake of fiber and fluids Lab Results  Component Value Date   HGBA1C 7.7 (H) 01/29/2021    Patient Goals/Self-Care Activities: Take medications as prescribed   Attend all scheduled provider appointments Call pharmacy for medication refills 3-7 days in advance of running out of medications Call provider office for new concerns or questions  check blood sugar at prescribed times: twice daily check feet daily for cuts, sores or redness enter blood sugar readings and medication or insulin into daily log take the blood sugar log to all doctor  visits take the blood sugar meter to all doctor visits fill half of plate with vegetables prepare main meal at home 3 to 5 days each week read food labels for fat, fiber, carbohydrates and portion size wear comfortable, cotton socks wear comfortable, well-fitting shoes eliminate smoking in my home eliminate symptom triggers at home follow rescue plan if symptoms flare-up get at least 7 to 8 hours of sleep at night do breathing exercises every day Continue working with palliative care Practice good handwashing, wear a mask as needed- avoid sick people Follow carbohydrate modified diet- be careful of intake of rice, potatoes, bread, pasta Avoid concentrated sweets Do not ever smoke around oxygen Use oxygen exactly as directed Drink adequate fluids  Eat adequate fiber in diet to help prevent constipation-  whole grains, fruit and vegetables Case closure today, please let your doctor know if you have any future needs for case management          Plan:No further follow up required: case closure today  Jacqlyn Larsen [facility], BSN RN Case Manager [facility] [phone]

## 2022-01-14 NOTE — Patient Instructions (Signed)
Visit Information  Thank you for taking time to visit with me today. Please don't hesitate to contact me if I can be of assistance to you before our next scheduled telephone appointment.  Following are the goals we discussed today:  Take medications as prescribed   Attend all scheduled provider appointments Call pharmacy for medication refills 3-7 days in advance of running out of medications Call provider office for new concerns or questions  check blood sugar at prescribed times: twice daily check feet daily for cuts, sores or redness enter blood sugar readings and medication or insulin into daily log take the blood sugar log to all doctor visits take the blood sugar meter to all doctor visits fill half of plate with vegetables prepare main meal at home 3 to 5 days each week read food labels for fat, fiber, carbohydrates and portion size wear comfortable, cotton socks wear comfortable, well-fitting shoes eliminate smoking in my home eliminate symptom triggers at home follow rescue plan if symptoms flare-up get at least 7 to 8 hours of sleep at night do breathing exercises every day Continue working with palliative care Practice good handwashing, wear a mask as needed- avoid sick people Follow carbohydrate modified diet- be careful of intake of rice, potatoes, bread, pasta Avoid concentrated sweets Do not ever smoke around oxygen Use oxygen exactly as directed Drink adequate fluids  Eat adequate fiber in diet to help prevent constipation-  whole grains, fruit and vegetables Case closure today, please let your doctor know if you have any future needs for case management   Please call the care guide team at [phone] if you need to cancel or reschedule your appointment.   If you are experiencing a Mental Health or [place] or need someone to talk to, please call the Suicide and Crisis Lifeline: 988 call the [place] National Suicide Prevention Lifeline:  2014639686 or TTY: 581-043-5868 TTY 830-166-2630) to talk to a trained counselor call 1-800-273-TALK (toll free, 24 hour hotline) go to [facility] [address] [place] [phone]) call 911   Patient verbalizes understanding of instructions and care plan provided today and agrees to view in [place]. Active MyChart status and patient understanding of how to access instructions and care plan via MyChart confirmed with patient.     Jacqlyn Larsen RNC, BSN RN Case Manager [facility] [phone]

## 2022-01-21 DIAGNOSIS — J449 Chronic obstructive pulmonary disease, unspecified: Secondary | ICD-10-CM

## 2022-01-21 DIAGNOSIS — Z794 Long term (current) use of insulin: Secondary | ICD-10-CM | POA: Diagnosis not present

## 2022-01-21 DIAGNOSIS — E1140 Type 2 diabetes mellitus with diabetic neuropathy, unspecified: Secondary | ICD-10-CM

## 2022-01-25 DIAGNOSIS — J449 Chronic obstructive pulmonary disease, unspecified: Secondary | ICD-10-CM | POA: Diagnosis not present

## 2022-01-28 ENCOUNTER — Telehealth: Payer: Medicare HMO | Admitting: Hospice

## 2022-02-07 ENCOUNTER — Telehealth: Payer: Medicare HMO | Admitting: Hospice

## 2022-02-07 ENCOUNTER — Encounter: Payer: Self-pay | Admitting: Family Medicine

## 2022-02-14 ENCOUNTER — Telehealth: Payer: Self-pay

## 2022-02-14 NOTE — Telephone Encounter (Signed)
Medication not on current list, routing for review.

## 2022-02-14 NOTE — Telephone Encounter (Signed)
Pharmacy faxed a refill request for glucose blood (ONETOUCH ULTRA) test strip [[id number]]    Order Details Dose, Route, Frequency: As Directed  Dispense Quantity: 100 strip Refills: 9        Sig: USE TO CHECK BLOOD SUGAR 4 TO 6 TIMES DAILY. E11.9       Start Date: 04/19/21 End Date: --  Written Date: 04/19/21 Expiration Date: 04/19/22

## 2022-02-18 ENCOUNTER — Telehealth: Payer: Self-pay

## 2022-02-18 MED ORDER — ONETOUCH ULTRA VI STRP: ORAL_STRIP | 9 refills | Status: AC

## 2022-02-18 NOTE — Telephone Encounter (Signed)
Pt came into office requesting a refill of:  glucose blood (ONETOUCH ULTRA) test strip [[id number]]  triamcinolone cream (KENALOG) 0.1 % [[id number]]    To be sent to [facility] on [address]  Cb#: [id number]

## 2022-02-19 ENCOUNTER — Other Ambulatory Visit: Payer: Self-pay

## 2022-02-19 ENCOUNTER — Other Ambulatory Visit: Payer: Self-pay | Admitting: Family Medicine

## 2022-02-19 DIAGNOSIS — E1140 Type 2 diabetes mellitus with diabetic neuropathy, unspecified: Secondary | ICD-10-CM

## 2022-02-19 MED ORDER — TRIAMCINOLONE ACETONIDE 0.1 % EX CREA: TOPICAL_CREAM | CUTANEOUS | 3 refills | Status: DC

## 2022-02-19 MED ORDER — ONETOUCH DELICA LANCETS 33G MISC: 5 refills | Status: AC

## 2022-02-21 ENCOUNTER — Ambulatory Visit (INDEPENDENT_AMBULATORY_CARE_PROVIDER_SITE_OTHER): Payer: Medicare HMO | Admitting: Family Medicine

## 2022-02-21 ENCOUNTER — Ambulatory Visit
Admission: RE | Admit: 2022-02-21 | Discharge: 2022-02-21 | Disposition: A | Discharge status: Home / Self Care | Payer: Medicare HMO | Source: Ambulatory Visit | Attending: Family Medicine | Admitting: Family Medicine

## 2022-02-21 VITALS — BP 122/62 | HR 73 | Temp 97.7°F | Ht 74.0 in | Wt 126.0 lb

## 2022-02-21 DIAGNOSIS — J984 Other disorders of lung: Secondary | ICD-10-CM

## 2022-02-21 DIAGNOSIS — J439 Emphysema, unspecified: Secondary | ICD-10-CM

## 2022-02-21 DIAGNOSIS — R059 Cough, unspecified: Secondary | ICD-10-CM | POA: Diagnosis not present

## 2022-02-21 DIAGNOSIS — R079 Chest pain, unspecified: Secondary | ICD-10-CM | POA: Diagnosis not present

## 2022-02-21 DIAGNOSIS — E43 Unspecified severe protein-calorie malnutrition: Secondary | ICD-10-CM | POA: Diagnosis not present

## 2022-02-21 MED ORDER — PREDNISONE 20 MG PO TABS: ORAL_TABLET | ORAL | 0 refills | Status: DC

## 2022-02-21 MED ORDER — TRIAMCINOLONE ACETONIDE 0.1 % EX CREA: TOPICAL_CREAM | CUTANEOUS | 3 refills | Status: DC

## 2022-02-21 MED ORDER — LEVOFLOXACIN 500 MG PO TABS: 500.0000 mg | ORAL_TABLET | Freq: Every day | ORAL | 0 refills | Status: AC

## 2022-02-21 NOTE — Progress Notes (Signed)
Subjective:    Patient ID: [name], male    DOB: December 29, 1934, 87 y.o.   MRN: [id number]  10/15/21 Spent approximately 1 hour today with the patient and his granddaughter reviewing the situation.  We reviewed the CT images of the right upper lobe mass.  Also showed the right middle lobe collapse due to mucous plugging in the right bronchus.  The patient has had pneumonia multiple times within the last few months and his health continues to decline.  He continues to look frail and weak and lose weight.  So today we had a long discussion about goals of care.  I do not believe the patient could tolerate surgery to remove any malignancy from his lung.  Therefore his treatment would likely be radiation or chemotherapy.  I believe his life expectancy regardless of the cancer is 6 months to a year given his poor overall health and severe COPD.  Therefore I do not feel that treatment for any malignancy would likely extend his life in a meaningful way.  It would also likely potentially add additional complications that would compromise his quality of life.  We discussed this at length.  We also discussed that the next step will be to try to get a tissue biopsy through bronchoscopy with pulmonology.  At this point, the patient does not want to pursue a biopsy.  We also discussed hospice and their ability to help with symptom control at home.  Although not an issue at the present time.  I feel that the patient will likely get to that point where he may be experiencing hypoxia and respiratory distress and pain.  Therefore I believe it would be beneficial to get hospice involved earlier in a nonemergent fashion to try to have a plan in place for if the situation decompensates.  At that time, my plan was:' We discussed his options.  Next step in treatment would be to obtain a tissue diagnosis through a referral back to his pulmonologist and bronchoscopy.  However the patient is frail and his life expectancy is less  than 6-12 months I believe regardless of the malignancy.  The patient elects at the present time not to proceed with biopsy or treatment.  He would like to discuss the situation with his family further.  He would consent to hospice to help with symptom control.  He does appear to have thrush in his mouth and I will treat that with nystatin 5 cc 4 times daily for 1 week.  I will treat the patient aggressively at the first sign of pneumonia in the future.  Otherwise, I recommended focusing on quality of life.  Anticipated life expectancy is less than 6 months without treatment.  02/21/22 Patient continues to lose weight Wt Readings from Last 3 Encounters:  02/21/22 126 lb (57.2 kg)  10/15/21 132 lb (59.9 kg)  10/04/21 129 lb 13.6 oz (58.9 kg)   He continues to appear frail.  He reports left-sided chest pain that radiates into his back.  He reports coughing.  He reports a cough productive of thick yellow and white mucus.  He denies any fever or chills.  He denies any hemoptysis.  He elected not to work-up the mass in his right lung.  He is not taking Spiriva or Symbicort.  He does use albuterol 2-3 times a day as needed.  There is no evidence of hitting edema in the extremities or signs of heart failure.  He denies any angina.  He denies any  association of the pain with activity although he is extremely sedentary.  My biggest concern would be a pneumonia in the left lung versus potential spread of malignancy versus a cracked rib from coughing   Past Medical History:  Diagnosis Date   Acute venous embolism and thrombosis of unspecified deep vessels of lower extremity    Atrial flutter (HCC)    and SVT that is post ablation   CAD (coronary artery disease)    Chronic kidney disease    stones   Coronary atherosclerosis 11/19/2008   Qualifier: Diagnosis of  By: Stanford Breed, MD, Kandyce Rud    Dyslipidemia    GERD (gastroesophageal reflux disease)    HTN (hypertension)    Shortness of breath     on exertion   Past Surgical History:  Procedure Laterality Date   CARDIAC CATHETERIZATION     s/p cardiac catheterization and bypass surgery as well as EGD with dilation x3, left lower extremity vein stripping   CORONARY ARTERY BYPASS GRAFT     ESOPHAGOGASTRODUODENOSCOPY N/A 12/06/2013   Procedure: ESOPHAGOGASTRODUODENOSCOPY (EGD);  Surgeon: Lear Ng, MD;  Location: [facility];  Service: Endoscopy;  Laterality: N/A;   left knee anthroscopy     SECONDARY CLOSURE ARM     VIDEO BRONCHOSCOPY Bilateral 01/14/2018   Procedure: VIDEO BRONCHOSCOPY WITHOUT FLUORO;  Surgeon: Tanda Rockers, MD;  Location: [facility];  Service: Cardiopulmonary;  Laterality: Bilateral;   Current Outpatient Medications on File Prior to Visit  Medication Sig Dispense Refill   acetaminophen (TYLENOL) 325 MG tablet Take 325-650 mg by mouth every 6 (six) hours as needed for mild pain or headache.     albuterol (PROVENTIL) (2.5 MG/3ML) 0.083% nebulizer solution Take 3 mLs (2.5 mg total) by nebulization every 6 (six) hours as needed for wheezing or shortness of breath. 75 mL 12   aspirin 325 MG tablet Take 325 mg by mouth daily.     blood glucose meter kit and supplies Dispense based on patient and insurance preference. Use up to four times daily as directed. (FOR ICD-10 E10.9, E11.9). 1 each 0   budesonide-formoterol (SYMBICORT) 160-4.5 MCG/ACT inhaler Inhale 2 puffs into the lungs 2 (two) times daily. 1 each 12   clobetasol cream (TEMOVATE) 0.05 % Please specify directions, refills and quantity 1 g 0   clotrimazole (LOTRIMIN) 1 % cream Apply 1 application topically 2 (two) times daily. Apply to the head of the penis 30 g 3   glucose blood (ONETOUCH ULTRA) test strip USE TO CHECK BLOOD SUGAR 4 TO 6 TIMES DAILY. E11.9 100 strip 9   guaiFENesin (MUCINEX) 600 MG 12 hr tablet Take 1 tablet (600 mg total) by mouth 2 (two) times daily. 30 tablet 2   HYDROcodone-acetaminophen (NORCO) 5-325 MG tablet Take 1 tablet by  mouth every 6 (six) hours as needed for moderate pain. 30 tablet 0   hydrOXYzine (ATARAX) 25 MG tablet TAKE 1 TABLET BY MOUTH 3 TIMES DAILY AS NEEDED FOR ITCHING. (Patient taking differently: Take 25 mg by mouth 3 (three) times daily as needed for itching.) 30 tablet 3   Insulin Syringe-Needle U-100 (BD INSULIN SYRINGE U/F) 31G X 5/16" 0.3 ML MISC USE WITH NOVOLIN TWICE DAY WITH A MEAL 100 each 5   ipratropium-albuterol (DUONEB) 0.5-2.5 (3) MG/3ML SOLN Take 3 mLs by nebulization 3 (three) times daily. 360 mL 2   nitroGLYCERIN (NITROSTAT) 0.4 MG SL tablet Place 0.4 mg under the tongue every 5 (five) minutes as needed for chest pain.  NOVOLIN 70/30 (70-30) 100 UNIT/ML injection INJECT 8 UNITS SQ IN THE MORNING AND INJECT 12 UNITS SQ AT BEDTIME (Patient taking differently: 8-12 Units See admin instructions. Inject 8 units into the skin in the morning and 12 units at bedtime) 10 mL 11   nystatin (MYCOSTATIN) 100000 UNIT/ML suspension Take 5 mLs (500,000 Units total) by mouth 4 (four) times daily. 60 mL 0   OneTouch Delica Lancets 35W MISC USE TO CHECK BLOOD SUAGR 3 TIMES A DAY DX:E11.9 100 each 5   OXYGEN Inhale 4 L/min into the lungs at bedtime.     pantoprazole (PROTONIX) 40 MG tablet TAKE 1 TABLET BY MOUTH EVERY DAY 90 tablet 3   pravastatin (PRAVACHOL) 40 MG tablet Take 1 tablet (40 mg total) by mouth daily. (Patient taking differently: Take 40 mg by mouth at bedtime.) 90 tablet 1   tamsulosin (FLOMAX) 0.4 MG CAPS capsule TAKE 1 CAPSULE BY MOUTH EVERY DAY 90 capsule 1   tiotropium (SPIRIVA HANDIHALER) 18 MCG inhalation capsule Place 1 capsule (18 mcg total) into inhaler and inhale daily. 30 capsule 2   triamcinolone cream (KENALOG) 0.1 % APPLY TO AFFECTED AREA TWICE A DAY 454 g 3   No current facility-administered medications on file prior to visit.   Allergies  Allergen Reactions   Celebrex [Celecoxib] Rash and Other (See Comments)    Blisters, also   Tape Other (See Comments)    EKG leads  leave red marks and PLEASE USE COBAN WRAP!! 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    Wife passed in 2019. Was married x 61 years. Lost 1 granddaughter in 2020 in [place].    2 sons, 2 grandchildren and 3 great grandchildren.    Social Determinants of Health   Financial Resource Strain: Low Risk  (08/31/2021)   Overall Financial Resource Strain (CARDIA)    Difficulty of Paying Living Expenses: Not hard at all  Food Insecurity: No Food Insecurity (08/31/2021)   Hunger Vital Sign    Worried About Running Out of Food in the Last Year: Never true    Ran Out of Food in the Last Year: Never true  Transportation Needs: No Transportation Needs (08/31/2021)   PRAPARE - Hydrologist (Medical): No    Lack of Transportation (Non-Medical): No  Physical Activity: Insufficiently Active (08/31/2021)   Exercise Vital Sign    Days of Exercise per Week: 3 days    Minutes of Exercise per Session: 20 min  Stress: No Stress Concern Present (08/31/2021)   [place]    Feeling of Stress : Only a little  Social Connections:  Moderately Isolated (08/31/2021)   Social Connection and Isolation Panel [NHANES]    Frequency of Communication with Friends and Family: More than  three times a week    Frequency of Social Gatherings with Friends and Family: More than three times a week    Attends Religious Services: 1 to 4 times per year    Active Member of [employer] or Organizations: Not on file    Attends Archivist Meetings: Never    Marital Status: Widowed  Intimate Partner Violence: Not At Risk (08/31/2021)   Humiliation, Afraid, Rape, and Kick questionnaire    Fear of Current or Ex-Partner: No    Emotionally Abused: No    Physically Abused: No    Sexually Abused: No      Review of Systems  Respiratory:  Positive for cough.        Objective:   Physical Exam Constitutional:      General: He is not in acute distress.    Appearance: Normal appearance. He is normal weight. He is ill-appearing. He is not toxic-appearing.  HENT:     Right Ear: Tympanic membrane and ear canal normal.     Left Ear: Tympanic membrane and ear canal normal.  Cardiovascular:     Rate and Rhythm: Normal rate and regular rhythm.     Heart sounds: Normal heart sounds. No murmur heard.    No friction rub. No gallop.  Pulmonary:     Effort: Pulmonary effort is normal. No tachypnea, accessory muscle usage or respiratory distress.     Breath sounds: Normal breath sounds. Decreased air movement present. No wheezing, rhonchi or rales.  Abdominal:     General: Abdomen is flat. Bowel sounds are normal. There is no distension.     Palpations: Abdomen is soft.     Tenderness: There is no abdominal tenderness. There is no guarding.  Neurological:     Mental Status: He is alert.   On pulmonary exam he has decreased air movement in all 4 lung fields.  There are no crackles or rails or rhonchi appreciated.  He is very frail and cachectic appearing.  I am unable to reproduce pain with palpation of the chest wall.    Assessment & Plan:  Left-sided chest pain - Plan: EKG 12-Lead Patient's EKG today shows normal sinus rhythm with no evidence of ischemia or infarction.  I truly believe  that the pain is more likely pulmonary in etiology.  I believe the patient is likely developing an early community-acquired pneumonia.  I will send the patient for chest x-ray and start the patient on Levaquin 500 mg daily for 7 days as well as a prednisone taper pack given his underlying emphysema and await the results of the chest x-ray.

## 2022-02-25 DIAGNOSIS — J449 Chronic obstructive pulmonary disease, unspecified: Secondary | ICD-10-CM | POA: Diagnosis not present

## 2022-02-27 ENCOUNTER — Ambulatory Visit: Payer: Medicare HMO | Attending: Cardiology | Admitting: Cardiology

## 2022-02-27 ENCOUNTER — Encounter: Payer: Self-pay | Admitting: Cardiology

## 2022-02-27 VITALS — BP 126/68 | HR 80 | Ht 74.0 in | Wt 127.0 lb

## 2022-02-27 DIAGNOSIS — I2510 Atherosclerotic heart disease of native coronary artery without angina pectoris: Secondary | ICD-10-CM

## 2022-02-27 DIAGNOSIS — E7800 Pure hypercholesterolemia, unspecified: Secondary | ICD-10-CM

## 2022-02-27 DIAGNOSIS — I10 Essential (primary) hypertension: Secondary | ICD-10-CM

## 2022-02-27 DIAGNOSIS — R072 Precordial pain: Secondary | ICD-10-CM

## 2022-02-27 NOTE — Progress Notes (Signed)
HPI: FU coronary artery disease, status post coronary bypassing graft. This was performed in 1998. His last catheterization in October 2008 showed a patent LIMA to the LAD and a patent saphenous vein graft to the right coronary artery. However, he did have significant left main disease that bifurcated into the circumflex. He had a drug-eluting stent to his left main at that time. Abdominal ultrasound in August of 2011 showed no aneurysm. He also has a history of SVT and atrial flutter ablation. Nuclear study July 2022 showed ejection fraction 54% and normal perfusion other than apical thinning.  Carotid Dopplers March 2023 showed 40 to 59% right and 60 to 79% left stenosis.  CTA April 2023 showed collapse of the right middle lobe, severe emphysema and pulmonary nodule in the right upper lobe concerning for primary bronchogenic neoplasm.  Since I last saw him he notes increased dyspnea on exertion and fatigue.  He continues to have occasional chest pain which is longstanding.  It can occur both with exertion and at rest and is unchanged in severity.  Last 2 to 30 minutes.  He stated it increased when he had pneumonia.  Current Outpatient Medications  Medication Sig Dispense Refill   acetaminophen (TYLENOL) 325 MG tablet Take 325-650 mg by mouth every 6 (six) hours as needed for mild pain or headache.     albuterol (PROVENTIL) (2.5 MG/3ML) 0.083% nebulizer solution Take 3 mLs (2.5 mg total) by nebulization every 6 (six) hours as needed for wheezing or shortness of breath. 75 mL 12   aspirin 325 MG tablet Take 325 mg by mouth daily.     blood glucose meter kit and supplies Dispense based on patient and insurance preference. Use up to four times daily as directed. (FOR ICD-10 E10.9, E11.9). 1 each 0   clobetasol cream (TEMOVATE) 0.05 % Please specify directions, refills and quantity 1 g 0   clotrimazole (LOTRIMIN) 1 % cream Apply 1 application topically 2 (two) times daily. Apply to the head of the  penis 30 g 3   glucose blood (ONETOUCH ULTRA) test strip USE TO CHECK BLOOD SUGAR 4 TO 6 TIMES DAILY. E11.9 100 strip 9   guaiFENesin (MUCINEX) 600 MG 12 hr tablet Take 1 tablet (600 mg total) by mouth 2 (two) times daily. 30 tablet 2   HYDROcodone-acetaminophen (NORCO) 5-325 MG tablet Take 1 tablet by mouth every 6 (six) hours as needed for moderate pain. 30 tablet 0   hydrOXYzine (ATARAX) 25 MG tablet TAKE 1 TABLET BY MOUTH 3 TIMES DAILY AS NEEDED FOR ITCHING. (Patient taking differently: Take 25 mg by mouth 3 (three) times daily as needed for itching.) 30 tablet 3   Insulin Syringe-Needle U-100 (BD INSULIN SYRINGE U/F) 31G X 5/16" 0.3 ML MISC USE WITH NOVOLIN TWICE DAY WITH A MEAL 100 each 5   nitroGLYCERIN (NITROSTAT) 0.4 MG SL tablet Place 0.4 mg under the tongue every 5 (five) minutes as needed for chest pain.     NOVOLIN 70/30 (70-30) 100 UNIT/ML injection INJECT 8 UNITS SQ IN THE MORNING AND INJECT 12 UNITS SQ AT BEDTIME (Patient taking differently: 8-12 Units See admin instructions. Inject 8 units into the skin in the morning and 12 units at bedtime) 10 mL 11   OneTouch Delica Lancets 14G MISC USE TO CHECK BLOOD SUAGR 3 TIMES A DAY DX:E11.9 100 each 5   OXYGEN Inhale 4 L/min into the lungs at bedtime.     pantoprazole (PROTONIX) 40 MG tablet TAKE 1 TABLET  BY MOUTH EVERY DAY 90 tablet 3   pravastatin (PRAVACHOL) 40 MG tablet Take 1 tablet (40 mg total) by mouth daily. (Patient taking differently: Take 40 mg by mouth at bedtime.) 90 tablet 1   triamcinolone cream (KENALOG) 0.1 % APPLY TO AFFECTED AREA TWICE A DAY 454 g 3   budesonide-formoterol (SYMBICORT) 160-4.5 MCG/ACT inhaler Inhale 2 puffs into the lungs 2 (two) times daily. (Patient not taking: Reported on 02/27/2022) 1 each 12   ipratropium-albuterol (DUONEB) 0.5-2.5 (3) MG/3ML SOLN Take 3 mLs by nebulization 3 (three) times daily. (Patient not taking: Reported on 02/27/2022) 360 mL 2   levofloxacin (LEVAQUIN) 500 MG tablet Take 1 tablet  (500 mg total) by mouth daily for 7 days. (Patient not taking: Reported on 02/27/2022) 7 tablet 0   nystatin (MYCOSTATIN) 100000 UNIT/ML suspension Take 5 mLs (500,000 Units total) by mouth 4 (four) times daily. (Patient not taking: Reported on 02/27/2022) 60 mL 0   predniSONE (DELTASONE) 20 MG tablet 3 tabs poqday 1-2, 2 tabs poqday 3-4, 1 tab poqday 5-6 (Patient not taking: Reported on 02/27/2022) 12 tablet 0   tamsulosin (FLOMAX) 0.4 MG CAPS capsule TAKE 1 CAPSULE BY MOUTH EVERY DAY (Patient not taking: Reported on 02/27/2022) 90 capsule 1   tiotropium (SPIRIVA HANDIHALER) 18 MCG inhalation capsule Place 1 capsule (18 mcg total) into inhaler and inhale daily. (Patient not taking: Reported on 02/27/2022) 30 capsule 2   No current facility-administered medications for this visit.     Past Medical History:  Diagnosis Date   Acute venous embolism and thrombosis of unspecified deep vessels of lower extremity    Atrial flutter (HCC)    and SVT that is post ablation   CAD (coronary artery disease)    Chronic kidney disease    stones   Coronary atherosclerosis 11/19/2008   Qualifier: Diagnosis of  By: Stanford Breed, MD, Kandyce Rud    Dyslipidemia    GERD (gastroesophageal reflux disease)    HTN (hypertension)    Shortness of breath    on exertion    Past Surgical History:  Procedure Laterality Date   CARDIAC CATHETERIZATION     s/p cardiac catheterization and bypass surgery as well as EGD with dilation x3, left lower extremity vein stripping   CORONARY ARTERY BYPASS GRAFT     ESOPHAGOGASTRODUODENOSCOPY N/A 12/06/2013   Procedure: ESOPHAGOGASTRODUODENOSCOPY (EGD);  Surgeon: Lear Ng, MD;  Location: [facility];  Service: Endoscopy;  Laterality: N/A;   left knee anthroscopy     SECONDARY CLOSURE ARM     VIDEO BRONCHOSCOPY Bilateral 01/14/2018   Procedure: VIDEO BRONCHOSCOPY WITHOUT FLUORO;  Surgeon: Tanda Rockers, MD;  Location: [facility];  Service: Cardiopulmonary;   Laterality: Bilateral;    Social History   Socioeconomic History   Marital status: Widowed    Spouse name: Not on file   Number of children: 2   Years of education: Not on file   Highest education level: Not on file  Occupational History   Not on file  Tobacco Use   Smoking status: Every Day    Packs/day: 1.00    Years: 60.00    Total pack years: 60.00    Types: Cigarettes   Smokeless tobacco: Never  Vaping Use   Vaping Use: Never used  Substance and Sexual Activity   Alcohol use: No   Drug use: No   Sexual activity: Not on file  Other Topics Concern   Not on file  Social History Narrative   Lives  in [place] and 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    Wife passed in 2019. Was married x 61 years. Lost 1 granddaughter in 2020 in [place].    2 sons, 2 grandchildren and 3 great grandchildren.    Social Determinants of Health   Financial Resource Strain: Low Risk  (08/31/2021)   Overall Financial Resource Strain (CARDIA)    Difficulty of Paying Living Expenses: Not hard at all  Food Insecurity: No Food Insecurity (08/31/2021)   Hunger Vital Sign    Worried About Running Out of Food in the Last Year: Never true    Ran Out of Food in the Last Year: Never true  Transportation Needs: No Transportation Needs (08/31/2021)   PRAPARE - Transportation    Lack of Transportation (Medical): No    Lack of Transportation (Non-Medical): No  Physical Activity: Insufficiently Active (08/31/2021)   Exercise Vital Sign    Days of Exercise per Week: 3 days    Minutes of Exercise per Session: 20 min  Stress: No Stress Concern Present (08/31/2021)   [employer] - Occupational Stress Questionnaire    Feeling of Stress : Only a little  Social Connections: Moderately Isolated (08/31/2021)   Social Connection and Isolation Panel [NHANES]    Frequency of Communication with Friends and Family: More than three  times a week    Frequency of Social Gatherings with Friends and Family: More than three times a week    Attends Religious Services: 1 to 4 times per year    Active Member of Clubs or Organizations: Not on file    Attends Club or Organization Meetings: Never    Marital Status: Widowed  Intimate Partner Violence: Not At Risk (08/31/2021)   Humiliation, Afraid, Rape, and Kick questionnaire    Fear of Current or Ex-Partner: No    Emotionally Abused: No    Physically Abused: No    Sexually Abused: No    History reviewed. No pertinent family history.  ROS: Fatigue but no fevers or chills, productive cough, hemoptysis, dysphasia, odynophagia, melena, hematochezia, dysuria, hematuria, rash, seizure activity, orthopnea, PND, pedal edema, claudication. Remaining systems are negative.  Physical Exam: Well-developed thin in no acute distress.  Skin is warm and dry.  HEENT is normal.  Neck is supple.  Chest with diminished breath sounds throughout Cardiovascular exam is regular rate and rhythm.  Abdominal exam nontender or distended. No masses palpated. Extremities show no edema. neuro grossly intact  ECG-February 21, 2022-sinus rhythm with no ST changes.  Personally reviewed  A/P  1 coronary artery disease-continue aspirin and statin.  2 chest pain-patient has had difficulties with occasional atypical chest pain in the past.  Recent electrocardiogram showed no ST changes.  Most recent nuclear study showed no ischemia.  Plan to continue medical therapy.  3 hypertension-blood pressure controlled.   4 hyperlipidemia-continue statin.  Note he did not tolerate higher doses in the past.  5 tobacco abuse-patient again counseled on discontinuing.  He states he will not quit.  6 carotid artery disease-plan follow-up carotid Dopplers March 2024.  7 history of atrial flutter-no documented recurrences.  8 lung mass-concern for malignancy.  This is followed by primary care.  He wants no  intervention.  Brian Crenshaw, MD    

## 2022-02-27 NOTE — Patient Instructions (Addendum)
  Follow-Up: At [facility], you and your health needs are our priority.  As part of our continuing mission to provide you with exceptional heart care, we have created designated Provider Care Teams.  These Care Teams include your primary Cardiologist (physician) and Advanced Practice Providers (APPs -  Physician Assistants and Nurse Practitioners) who all work together to provide you with the care you need, when you need it.  We recommend signing up for the patient portal called "MyChart".  Sign up information is provided on this After Visit Summary.  MyChart is used to connect with patients for Virtual Visits (Telemedicine).  Patients are able to view lab/test results, encounter notes, upcoming appointments, etc.  Non-urgent messages can be sent to your provider as well.   To learn more about what you can do with MyChart, go to [email or web address].    Your next appointment:   6 month(s)  The format for your next appointment:   In Person  Provider:   Brian Crenshaw, MD    

## 2022-03-27 DIAGNOSIS — J449 Chronic obstructive pulmonary disease, unspecified: Secondary | ICD-10-CM | POA: Diagnosis not present

## 2022-04-15 ENCOUNTER — Other Ambulatory Visit: Payer: Self-pay | Admitting: Family Medicine

## 2022-04-15 DIAGNOSIS — R300 Dysuria: Secondary | ICD-10-CM

## 2022-04-15 DIAGNOSIS — E1140 Type 2 diabetes mellitus with diabetic neuropathy, unspecified: Secondary | ICD-10-CM

## 2022-04-23 DIAGNOSIS — R079 Chest pain, unspecified: Secondary | ICD-10-CM | POA: Diagnosis not present

## 2022-04-25 ENCOUNTER — Telehealth: Payer: Self-pay

## 2022-04-25 ENCOUNTER — Ambulatory Visit: Payer: Medicare HMO | Admitting: Cardiology

## 2022-04-25 NOTE — Telephone Encounter (Signed)
  Prescription Request  04/25/2022  Is this a "Controlled Substance" medicine? No  LOV: 02/21/2022   What is the name of the medication or equipment? hydrOXYzine (ATARAX) 25 MG tablet   Have you contacted your pharmacy to request a refill? No   Which pharmacy would you like this sent to?  [id number] [address] [place] [address] Phone: [phone] Fax: [phone]   Patient notified that their request is being sent to the clinical staff for review and that they should receive a response within 2 business days.   Please advise at [phone] (Pt is extremely HOH)

## 2022-04-26 ENCOUNTER — Other Ambulatory Visit: Payer: Self-pay | Admitting: Family Medicine

## 2022-04-26 MED ORDER — HYDROXYZINE HCL 25 MG PO TABS: 25.0000 mg | ORAL_TABLET | Freq: Three times a day (TID) | ORAL | 1 refills | Status: AC | PRN

## 2022-04-27 DIAGNOSIS — J449 Chronic obstructive pulmonary disease, unspecified: Secondary | ICD-10-CM | POA: Diagnosis not present

## 2022-04-30 ENCOUNTER — Telehealth: Payer: Self-pay

## 2022-04-30 NOTE — Telephone Encounter (Signed)
(  8:53 am) PC SW telephoned patient's granddaughter-Amanda to discuss patient status and program changes. She advised that patient's condition is relatively stable at this time. SW explained that palliative care would sign off on his care at time due to his stable status. SW encouraged her to call with change in condition/decline for support. She verbalized understanding and verbalized no other concerns.   *Palliative care will be discontinued effective today, 04/30/22.

## 2022-05-27 DIAGNOSIS — J449 Chronic obstructive pulmonary disease, unspecified: Secondary | ICD-10-CM | POA: Diagnosis not present

## 2022-06-10 ENCOUNTER — Emergency Department (HOSPITAL_COMMUNITY): Payer: Medicare HMO

## 2022-06-10 ENCOUNTER — Encounter (HOSPITAL_COMMUNITY): Payer: Self-pay | Admitting: General Surgery

## 2022-06-10 ENCOUNTER — Inpatient Hospital Stay (HOSPITAL_COMMUNITY)
Admission: EM | Admit: 2022-06-10 | Discharge: 2022-06-14 | DRG: 193 | Disposition: A | Discharge status: Home Health | Payer: Medicare HMO | Attending: Internal Medicine | Admitting: Internal Medicine

## 2022-06-10 DIAGNOSIS — I4891 Unspecified atrial fibrillation: Secondary | ICD-10-CM | POA: Diagnosis present

## 2022-06-10 DIAGNOSIS — Z681 Body mass index (BMI) 19 or less, adult: Secondary | ICD-10-CM

## 2022-06-10 DIAGNOSIS — C3411 Malignant neoplasm of upper lobe, right bronchus or lung: Secondary | ICD-10-CM | POA: Diagnosis present

## 2022-06-10 DIAGNOSIS — E872 Acidosis, unspecified: Secondary | ICD-10-CM | POA: Diagnosis present

## 2022-06-10 DIAGNOSIS — R6510 Systemic inflammatory response syndrome (SIRS) of non-infectious origin without acute organ dysfunction: Secondary | ICD-10-CM | POA: Diagnosis present

## 2022-06-10 DIAGNOSIS — A419 Sepsis, unspecified organism: Secondary | ICD-10-CM | POA: Diagnosis not present

## 2022-06-10 DIAGNOSIS — E1122 Type 2 diabetes mellitus with diabetic chronic kidney disease: Secondary | ICD-10-CM | POA: Diagnosis present

## 2022-06-10 DIAGNOSIS — M6282 Rhabdomyolysis: Secondary | ICD-10-CM | POA: Diagnosis not present

## 2022-06-10 DIAGNOSIS — Z1152 Encounter for screening for COVID-19: Secondary | ICD-10-CM | POA: Diagnosis not present

## 2022-06-10 DIAGNOSIS — J439 Emphysema, unspecified: Secondary | ICD-10-CM | POA: Diagnosis present

## 2022-06-10 DIAGNOSIS — Z86718 Personal history of other venous thrombosis and embolism: Secondary | ICD-10-CM

## 2022-06-10 DIAGNOSIS — Z23 Encounter for immunization: Secondary | ICD-10-CM

## 2022-06-10 DIAGNOSIS — N179 Acute kidney failure, unspecified: Secondary | ICD-10-CM | POA: Diagnosis present

## 2022-06-10 DIAGNOSIS — G9341 Metabolic encephalopathy: Secondary | ICD-10-CM | POA: Diagnosis not present

## 2022-06-10 DIAGNOSIS — I129 Hypertensive chronic kidney disease with stage 1 through stage 4 chronic kidney disease, or unspecified chronic kidney disease: Secondary | ICD-10-CM | POA: Diagnosis present

## 2022-06-10 DIAGNOSIS — R54 Age-related physical debility: Secondary | ICD-10-CM | POA: Diagnosis present

## 2022-06-10 DIAGNOSIS — Z043 Encounter for examination and observation following other accident: Secondary | ICD-10-CM | POA: Diagnosis not present

## 2022-06-10 DIAGNOSIS — M549 Dorsalgia, unspecified: Secondary | ICD-10-CM | POA: Diagnosis not present

## 2022-06-10 DIAGNOSIS — I959 Hypotension, unspecified: Secondary | ICD-10-CM | POA: Diagnosis present

## 2022-06-10 DIAGNOSIS — M79662 Pain in left lower leg: Secondary | ICD-10-CM | POA: Diagnosis not present

## 2022-06-10 DIAGNOSIS — S0990XA Unspecified injury of head, initial encounter: Secondary | ICD-10-CM | POA: Diagnosis not present

## 2022-06-10 DIAGNOSIS — M79672 Pain in left foot: Secondary | ICD-10-CM | POA: Diagnosis not present

## 2022-06-10 DIAGNOSIS — Z66 Do not resuscitate: Secondary | ICD-10-CM | POA: Diagnosis not present

## 2022-06-10 DIAGNOSIS — S0181XA Laceration without foreign body of other part of head, initial encounter: Secondary | ICD-10-CM | POA: Diagnosis not present

## 2022-06-10 DIAGNOSIS — Z9981 Dependence on supplemental oxygen: Secondary | ICD-10-CM

## 2022-06-10 DIAGNOSIS — Z7951 Long term (current) use of inhaled steroids: Secondary | ICD-10-CM

## 2022-06-10 DIAGNOSIS — W1830XA Fall on same level, unspecified, initial encounter: Secondary | ICD-10-CM | POA: Diagnosis not present

## 2022-06-10 DIAGNOSIS — S01412A Laceration without foreign body of left cheek and temporomandibular area, initial encounter: Secondary | ICD-10-CM | POA: Diagnosis not present

## 2022-06-10 DIAGNOSIS — N1831 Chronic kidney disease, stage 3a: Secondary | ICD-10-CM | POA: Diagnosis not present

## 2022-06-10 DIAGNOSIS — E1165 Type 2 diabetes mellitus with hyperglycemia: Secondary | ICD-10-CM | POA: Diagnosis not present

## 2022-06-10 DIAGNOSIS — Z951 Presence of aortocoronary bypass graft: Secondary | ICD-10-CM

## 2022-06-10 DIAGNOSIS — N400 Enlarged prostate without lower urinary tract symptoms: Secondary | ICD-10-CM | POA: Diagnosis not present

## 2022-06-10 DIAGNOSIS — R911 Solitary pulmonary nodule: Secondary | ICD-10-CM | POA: Diagnosis present

## 2022-06-10 DIAGNOSIS — S022XXA Fracture of nasal bones, initial encounter for closed fracture: Secondary | ICD-10-CM | POA: Diagnosis present

## 2022-06-10 DIAGNOSIS — Z79899 Other long term (current) drug therapy: Secondary | ICD-10-CM

## 2022-06-10 DIAGNOSIS — J441 Chronic obstructive pulmonary disease with (acute) exacerbation: Secondary | ICD-10-CM | POA: Diagnosis not present

## 2022-06-10 DIAGNOSIS — E785 Hyperlipidemia, unspecified: Secondary | ICD-10-CM | POA: Diagnosis present

## 2022-06-10 DIAGNOSIS — Z794 Long term (current) use of insulin: Secondary | ICD-10-CM

## 2022-06-10 DIAGNOSIS — L89152 Pressure ulcer of sacral region, stage 2: Secondary | ICD-10-CM | POA: Diagnosis present

## 2022-06-10 DIAGNOSIS — I480 Paroxysmal atrial fibrillation: Secondary | ICD-10-CM | POA: Diagnosis not present

## 2022-06-10 DIAGNOSIS — E1136 Type 2 diabetes mellitus with diabetic cataract: Secondary | ICD-10-CM | POA: Diagnosis present

## 2022-06-10 DIAGNOSIS — J189 Pneumonia, unspecified organism: Principal | ICD-10-CM | POA: Diagnosis present

## 2022-06-10 DIAGNOSIS — J9621 Acute and chronic respiratory failure with hypoxia: Secondary | ICD-10-CM | POA: Diagnosis not present

## 2022-06-10 DIAGNOSIS — W19XXXA Unspecified fall, initial encounter: Principal | ICD-10-CM

## 2022-06-10 DIAGNOSIS — G8929 Other chronic pain: Secondary | ICD-10-CM | POA: Diagnosis present

## 2022-06-10 DIAGNOSIS — I2510 Atherosclerotic heart disease of native coronary artery without angina pectoris: Secondary | ICD-10-CM | POA: Diagnosis present

## 2022-06-10 DIAGNOSIS — R000 Tachycardia, unspecified: Secondary | ICD-10-CM | POA: Diagnosis not present

## 2022-06-10 DIAGNOSIS — I9589 Other hypotension: Secondary | ICD-10-CM | POA: Diagnosis not present

## 2022-06-10 DIAGNOSIS — R6520 Severe sepsis without septic shock: Secondary | ICD-10-CM | POA: Diagnosis not present

## 2022-06-10 DIAGNOSIS — F17210 Nicotine dependence, cigarettes, uncomplicated: Secondary | ICD-10-CM | POA: Diagnosis not present

## 2022-06-10 DIAGNOSIS — Y92009 Unspecified place in unspecified non-institutional (private) residence as the place of occurrence of the external cause: Secondary | ICD-10-CM

## 2022-06-10 DIAGNOSIS — J168 Pneumonia due to other specified infectious organisms: Secondary | ICD-10-CM | POA: Diagnosis not present

## 2022-06-10 DIAGNOSIS — S8002XA Contusion of left knee, initial encounter: Secondary | ICD-10-CM | POA: Diagnosis present

## 2022-06-10 DIAGNOSIS — E43 Unspecified severe protein-calorie malnutrition: Secondary | ICD-10-CM | POA: Diagnosis present

## 2022-06-10 DIAGNOSIS — H9190 Unspecified hearing loss, unspecified ear: Secondary | ICD-10-CM | POA: Diagnosis present

## 2022-06-10 DIAGNOSIS — S199XXA Unspecified injury of neck, initial encounter: Secondary | ICD-10-CM | POA: Diagnosis not present

## 2022-06-10 DIAGNOSIS — J180 Bronchopneumonia, unspecified organism: Secondary | ICD-10-CM | POA: Diagnosis not present

## 2022-06-10 DIAGNOSIS — J449 Chronic obstructive pulmonary disease, unspecified: Secondary | ICD-10-CM | POA: Diagnosis not present

## 2022-06-10 DIAGNOSIS — Z7982 Long term (current) use of aspirin: Secondary | ICD-10-CM

## 2022-06-10 DIAGNOSIS — S40211A Abrasion of right shoulder, initial encounter: Secondary | ICD-10-CM | POA: Diagnosis present

## 2022-06-10 DIAGNOSIS — J440 Chronic obstructive pulmonary disease with acute lower respiratory infection: Secondary | ICD-10-CM | POA: Diagnosis not present

## 2022-06-10 DIAGNOSIS — R0902 Hypoxemia: Secondary | ICD-10-CM

## 2022-06-10 DIAGNOSIS — K219 Gastro-esophageal reflux disease without esophagitis: Secondary | ICD-10-CM | POA: Diagnosis present

## 2022-06-10 DIAGNOSIS — Z886 Allergy status to analgesic agent status: Secondary | ICD-10-CM

## 2022-06-10 DIAGNOSIS — I10 Essential (primary) hypertension: Secondary | ICD-10-CM | POA: Diagnosis not present

## 2022-06-10 DIAGNOSIS — N189 Chronic kidney disease, unspecified: Secondary | ICD-10-CM | POA: Diagnosis not present

## 2022-06-10 DIAGNOSIS — M1712 Unilateral primary osteoarthritis, left knee: Secondary | ICD-10-CM | POA: Diagnosis not present

## 2022-06-10 DIAGNOSIS — Z79891 Long term (current) use of opiate analgesic: Secondary | ICD-10-CM

## 2022-06-10 DIAGNOSIS — Z888 Allergy status to other drugs, medicaments and biological substances status: Secondary | ICD-10-CM

## 2022-06-10 LAB — I-STAT CHEM 8, ED
BUN: 36 mg/dL — ABNORMAL HIGH (ref 8–23)
Calcium, Ion: 1.08 mmol/L — ABNORMAL LOW (ref 1.15–1.40)
Chloride: 103 mmol/L (ref 98–111)
Creatinine, Ser: 2 mg/dL — ABNORMAL HIGH (ref 0.61–1.24)
Glucose, Bld: 258 mg/dL — ABNORMAL HIGH (ref 70–99)
HCT: 39 % (ref 39.0–52.0)
Hemoglobin: 13.3 g/dL (ref 13.0–17.0)
Potassium: 4.7 mmol/L (ref 3.5–5.1)
Sodium: 136 mmol/L (ref 135–145)
TCO2: 21 mmol/L — ABNORMAL LOW (ref 22–32)

## 2022-06-10 LAB — COMPREHENSIVE METABOLIC PANEL
ALT: 17 U/L (ref 0–44)
AST: 36 U/L (ref 15–41)
Albumin: 3.2 g/dL — ABNORMAL LOW (ref 3.5–5.0)
Alkaline Phosphatase: 52 U/L (ref 38–126)
Anion gap: 16 — ABNORMAL HIGH (ref 5–15)
BUN: 33 mg/dL — ABNORMAL HIGH (ref 8–23)
CO2: 18 mmol/L — ABNORMAL LOW (ref 22–32)
Calcium: 8.8 mg/dL — ABNORMAL LOW (ref 8.9–10.3)
Chloride: 100 mmol/L (ref 98–111)
Creatinine, Ser: 2.05 mg/dL — ABNORMAL HIGH (ref 0.61–1.24)
GFR, Estimated: 31 mL/min — ABNORMAL LOW (ref >60)
Glucose, Bld: 261 mg/dL — ABNORMAL HIGH (ref 70–99)
Potassium: 4.6 mmol/L (ref 3.5–5.1)
Sodium: 134 mmol/L — ABNORMAL LOW (ref 135–145)
Total Bilirubin: 0.9 mg/dL (ref 0.3–1.2)
Total Protein: 8.1 g/dL (ref 6.5–8.1)

## 2022-06-10 LAB — CBC
HCT: 39.9 % (ref 39.0–52.0)
Hemoglobin: 13.1 g/dL (ref 13.0–17.0)
MCH: 29.4 pg (ref 26.0–34.0)
MCHC: 32.8 g/dL (ref 30.0–36.0)
MCV: 89.7 fL (ref 80.0–100.0)
Platelets: 151 10*3/uL (ref 150–400)
RBC: 4.45 MIL/uL (ref 4.22–5.81)
RDW: 17 % — ABNORMAL HIGH (ref 11.5–15.5)
WBC: 13 10*3/uL — ABNORMAL HIGH (ref 4.0–10.5)
nRBC: 0 % (ref 0.0–0.2)

## 2022-06-10 LAB — RESP PANEL BY RT-PCR (RSV, FLU A&B, COVID)  RVPGX2
Influenza A by PCR: NEGATIVE
Influenza B by PCR: NEGATIVE
Resp Syncytial Virus by PCR: NEGATIVE
SARS Coronavirus 2 by RT PCR: NEGATIVE

## 2022-06-10 LAB — PROTIME-INR
INR: 1.3 — ABNORMAL HIGH (ref 0.8–1.2)
Prothrombin Time: 16.5 seconds — ABNORMAL HIGH (ref 11.4–15.2)

## 2022-06-10 LAB — SAMPLE TO BLOOD BANK

## 2022-06-10 LAB — PROCALCITONIN: Procalcitonin: 4.28 ng/mL

## 2022-06-10 LAB — LACTIC ACID, PLASMA
Lactic Acid, Venous: 4.8 mmol/L (ref 0.5–1.9)
Lactic Acid, Venous: 1.9 mmol/L (ref 0.5–1.9)

## 2022-06-10 LAB — CBG MONITORING, ED
Glucose-Capillary: 267 mg/dL — ABNORMAL HIGH (ref 70–99)
Glucose-Capillary: 303 mg/dL — ABNORMAL HIGH (ref 70–99)
Glucose-Capillary: 273 mg/dL — ABNORMAL HIGH (ref 70–99)

## 2022-06-10 LAB — ETHANOL: Alcohol, Ethyl (B): <10 mg/dL (ref <10)

## 2022-06-10 MED ORDER — ENOXAPARIN SODIUM 30 MG/0.3ML IJ SOSY
30.0000 mg | PREFILLED_SYRINGE | INTRAMUSCULAR | Status: DC
  Administered 2022-06-10 – 2022-06-13 (×4): 30 mg via SUBCUTANEOUS
  Filled 2022-06-10 (×4): qty 0.3

## 2022-06-10 MED ORDER — INSULIN ASPART PROT & ASPART (70-30 MIX) 100 UNIT/ML ~~LOC~~ SUSP
12.0000 [IU] | Freq: Every day | SUBCUTANEOUS | Status: DC
  Administered 2022-06-11 – 2022-06-14 (×4): 12 [IU] via SUBCUTANEOUS
  Filled 2022-06-10: qty 10

## 2022-06-10 MED ORDER — MORPHINE SULFATE (PF) 2 MG/ML IV SOLN
2.0000 mg | INTRAVENOUS | Status: DC | PRN
  Administered 2022-06-10 (×2): 2 mg via INTRAVENOUS
  Filled 2022-06-10 (×2): qty 1

## 2022-06-10 MED ORDER — SODIUM CHLORIDE 0.9 % IV SOLN
500.0000 mg | INTRAVENOUS | Status: DC
  Administered 2022-06-11 – 2022-06-12 (×2): 500 mg via INTRAVENOUS
  Filled 2022-06-10 (×2): qty 5

## 2022-06-10 MED ORDER — SODIUM CHLORIDE 0.9 % IV SOLN
1.0000 g | Freq: Once | INTRAVENOUS | Status: AC
  Administered 2022-06-10: 1 g via INTRAVENOUS
  Filled 2022-06-10: qty 10

## 2022-06-10 MED ORDER — INSULIN ASPART PROT & ASPART (70-30 MIX) 100 UNIT/ML ~~LOC~~ SUSP
8.0000 [IU] | Freq: Every day | SUBCUTANEOUS | Status: DC
  Administered 2022-06-10 – 2022-06-13 (×3): 8 [IU] via SUBCUTANEOUS

## 2022-06-10 MED ORDER — ACETAMINOPHEN 325 MG PO TABS
650.0000 mg | ORAL_TABLET | Freq: Four times a day (QID) | ORAL | Status: DC | PRN
  Administered 2022-06-12 – 2022-06-13 (×2): 650 mg via ORAL
  Filled 2022-06-10 (×2): qty 2

## 2022-06-10 MED ORDER — ALBUTEROL SULFATE (2.5 MG/3ML) 0.083% IN NEBU
2.5000 mg | INHALATION_SOLUTION | Freq: Four times a day (QID) | RESPIRATORY_TRACT | Status: DC
  Administered 2022-06-10 – 2022-06-12 (×6): 2.5 mg via RESPIRATORY_TRACT
  Filled 2022-06-10 (×6): qty 3

## 2022-06-10 MED ORDER — ALBUTEROL SULFATE (2.5 MG/3ML) 0.083% IN NEBU
2.5000 mg | INHALATION_SOLUTION | RESPIRATORY_TRACT | Status: DC | PRN
  Administered 2022-06-11 (×2): 2.5 mg via RESPIRATORY_TRACT
  Filled 2022-06-10 (×2): qty 3

## 2022-06-10 MED ORDER — ASPIRIN 325 MG PO TABS
325.0000 mg | ORAL_TABLET | Freq: Every day | ORAL | Status: DC
  Administered 2022-06-11 – 2022-06-14 (×4): 325 mg via ORAL
  Filled 2022-06-10 (×4): qty 1

## 2022-06-10 MED ORDER — SODIUM CHLORIDE 0.9 % IV SOLN: INTRAVENOUS | Status: DC

## 2022-06-10 MED ORDER — HYDROCODONE-ACETAMINOPHEN 5-325 MG PO TABS: 1.0000 | ORAL_TABLET | Freq: Four times a day (QID) | ORAL | Status: DC | PRN

## 2022-06-10 MED ORDER — SODIUM CHLORIDE 0.9 % IV SOLN
2.0000 g | INTRAVENOUS | Status: DC
  Administered 2022-06-11 – 2022-06-14 (×4): 2 g via INTRAVENOUS
  Filled 2022-06-10 (×4): qty 20

## 2022-06-10 MED ORDER — GUAIFENESIN ER 600 MG PO TB12
600.0000 mg | ORAL_TABLET | Freq: Two times a day (BID) | ORAL | Status: DC
  Administered 2022-06-10 – 2022-06-11 (×3): 600 mg via ORAL
  Filled 2022-06-10 (×4): qty 1

## 2022-06-10 MED ORDER — PRAVASTATIN SODIUM 40 MG PO TABS
40.0000 mg | ORAL_TABLET | Freq: Every day | ORAL | Status: DC
  Administered 2022-06-10 – 2022-06-12 (×3): 40 mg via ORAL
  Filled 2022-06-10 (×3): qty 1

## 2022-06-10 MED ORDER — SODIUM CHLORIDE 0.9 % IV BOLUS
10.0000 mL/kg | Freq: Once | INTRAVENOUS | Status: AC
  Administered 2022-06-10: 580 mL via INTRAVENOUS

## 2022-06-10 MED ORDER — PANTOPRAZOLE SODIUM 40 MG PO TBEC
40.0000 mg | DELAYED_RELEASE_TABLET | Freq: Every day | ORAL | Status: DC
  Administered 2022-06-10 – 2022-06-14 (×5): 40 mg via ORAL
  Filled 2022-06-10 (×5): qty 1

## 2022-06-10 MED ORDER — LIDOCAINE-EPINEPHRINE-TETRACAINE (LET) TOPICAL GEL
6.0000 mL | Freq: Once | TOPICAL | Status: AC
  Administered 2022-06-10: 6 mL via TOPICAL
  Filled 2022-06-10: qty 6

## 2022-06-10 MED ORDER — CLOTRIMAZOLE 1 % EX CREA: 1.0000 | TOPICAL_CREAM | Freq: Two times a day (BID) | CUTANEOUS | Status: DC | PRN

## 2022-06-10 MED ORDER — ACETAMINOPHEN 650 MG RE SUPP: 650.0000 mg | Freq: Four times a day (QID) | RECTAL | Status: DC | PRN

## 2022-06-10 MED ORDER — TAMSULOSIN HCL 0.4 MG PO CAPS
0.4000 mg | ORAL_CAPSULE | Freq: Every day | ORAL | Status: DC
  Administered 2022-06-10 – 2022-06-14 (×5): 0.4 mg via ORAL
  Filled 2022-06-10 (×5): qty 1

## 2022-06-10 MED ORDER — HYDROXYZINE HCL 25 MG PO TABS: 25.0000 mg | ORAL_TABLET | Freq: Three times a day (TID) | ORAL | Status: DC | PRN

## 2022-06-10 MED ORDER — SODIUM CHLORIDE 0.9 % IV SOLN
500.0000 mg | Freq: Once | INTRAVENOUS | Status: AC
  Administered 2022-06-10: 500 mg via INTRAVENOUS
  Filled 2022-06-10: qty 5

## 2022-06-10 MED ORDER — INSULIN ASPART 100 UNIT/ML IJ SOLN
0.0000 [IU] | Freq: Three times a day (TID) | INTRAMUSCULAR | Status: DC
  Administered 2022-06-10: 7 [IU] via SUBCUTANEOUS
  Administered 2022-06-11: 3 [IU] via SUBCUTANEOUS
  Administered 2022-06-11: 2 [IU] via SUBCUTANEOUS
  Administered 2022-06-12 (×2): 3 [IU] via SUBCUTANEOUS
  Administered 2022-06-13: 1 [IU] via SUBCUTANEOUS
  Administered 2022-06-13: 2 [IU] via SUBCUTANEOUS
  Administered 2022-06-13: 3 [IU] via SUBCUTANEOUS
  Administered 2022-06-14: 2 [IU] via SUBCUTANEOUS

## 2022-06-10 MED ORDER — TETANUS-DIPHTH-ACELL PERTUSSIS 5-2.5-18.5 LF-MCG/0.5 IM SUSY
0.5000 mL | PREFILLED_SYRINGE | Freq: Once | INTRAMUSCULAR | Status: AC
  Administered 2022-06-10: 0.5 mL via INTRAMUSCULAR
  Filled 2022-06-10: qty 0.5

## 2022-06-10 MED ORDER — SODIUM CHLORIDE 0.9% FLUSH
3.0000 mL | Freq: Two times a day (BID) | INTRAVENOUS | Status: DC
  Administered 2022-06-11 – 2022-06-14 (×5): 3 mL via INTRAVENOUS

## 2022-06-10 MED ORDER — IOHEXOL 350 MG/ML SOLN
50.0000 mL | Freq: Once | INTRAVENOUS | Status: AC | PRN
  Administered 2022-06-10: 50 mL via INTRAVENOUS

## 2022-06-10 NOTE — ED Notes (Signed)
Granddaughter at bedside-- brought pt in from home

## 2022-06-10 NOTE — Consult Note (Signed)
[name] [birth year]  [id number].    Requesting MD: Dr. Harrell Gave Tegeler Chief Complaint/Reason for Consult: level 1 trauma, fall with hypotension  HPI:  This is a pleasant 87 yo white male who lives alone with a history of CAD, s/p CABG, COPD, (1ppd over 24 yrs), emphysema, history of pulmonary lesion and PNA, HTN (but meds stopped due to hypotension), CKD, and DM who apparently fell last night in the middle of the night.  He called his family this morning who found him with blood all over his face.  He was brought in by private vehicle and made a level 1 due to fall with hypotension.  No lacerations were found, suspected blood from nose.  He complains of some pain in his left knee but otherwise doesn't complain of much else.  His pupils are unequal at baseline due to cataracts.  He was started on IVFs upon arrival with a great response in his BP.  He remained alert and oriented during his entire work up in the ED.  ROS: ROS: please see HPI.  Will occasionally wear O2 at night if needed. + cough, but chronic  History reviewed. No pertinent family history.  Past Medical History:  Diagnosis Date   Acute venous embolism and thrombosis of unspecified deep vessels of lower extremity    Atrial flutter (HCC)    and SVT that is post ablation   CAD (coronary artery disease)    Chronic kidney disease    stones   Coronary atherosclerosis 11/19/2008   Qualifier: Diagnosis of  By: Stanford Breed, MD, Kandyce Rud    Dyslipidemia    GERD (gastroesophageal reflux disease)    HTN (hypertension)    Shortness of breath    on exertion    Past Surgical History:  Procedure Laterality Date   CARDIAC CATHETERIZATION     s/p cardiac catheterization and bypass surgery as well as EGD with dilation x3, left lower extremity vein stripping   CORONARY ARTERY BYPASS GRAFT     ESOPHAGOGASTRODUODENOSCOPY N/A 12/06/2013   Procedure: ESOPHAGOGASTRODUODENOSCOPY (EGD);  Surgeon: Lear Ng, MD;  Location: [facility];  Service: Endoscopy;  Laterality: N/A;   left knee anthroscopy     SECONDARY CLOSURE ARM     VIDEO BRONCHOSCOPY Bilateral 01/14/2018   Procedure: VIDEO BRONCHOSCOPY WITHOUT FLUORO;  Surgeon: Tanda Rockers, MD;  Location: [facility];  Service: Cardiopulmonary;  Laterality: Bilateral;    Social History:  reports that he has been smoking cigarettes. He has a 60.00 pack-year smoking history. He has never used smokeless tobacco. He reports that he does not drink alcohol and does not use drugs.  Allergies:  Allergies  Allergen Reactions   Celebrex [Celecoxib] Rash and Other (See Comments)    Blisters, also   Tape Other (See Comments)    EKG leads leave red marks and PLEASE USE COBAN WRAP!! Skin tears and bruises easily    (Not in a hospital admission)    Physical Exam: Blood pressure (!) 72/48, pulse 99, temperature (!) 97.4 F (36.3 C), resp. rate (!) 28, height (S) 6\' 2"  (1.88 m), weight (S) 52.2 kg, SpO2 (!) 86 %. General: pleasant, but somewhat frail appearing white male who is laying in bed in NAD HEENT: head is normocephalic, but has a small cut to his upper right lip and his right lower chin, not bleeding.  Significant crusting from dried blood in his nares and dried blood all over his face and head.  No scalp  lacerations or abrasions  Sclera are noninjected.  Right pupil is approximately 4-33mm and left pupil is approximately 72mm. Reactive to light.  Ears without any masses or lesions.  HOH.  Mouth is pink and dry.  Nasal bridge appears to be somewhat edematous and tender to palpation. Neck: normal ROM for age.  No pain with movement.  No midline tenderness Heart: regular, rate, and rhythm.  Normal s1,s2. No obvious murmurs, gallops, or rubs noted.  Palpable radial and pedal pulses bilaterally Lungs: CTAB, no wheezes, rhonchi, or rales noted.  Respiratory effort nonlabored.  Originally sats in mid 35s.  Came up to mid 90s with 2L O2.  No chest  wall tenderness or abrasions. Abd: soft, NT, ND, +BS, no masses, hernias, or organomegaly MS: all 4 extremities are symmetrical with no cyanosis, clubbing, or edema.  Good ROM of all joints.  Ecchymosis noted to left patella with some tenderness.  No edema noted.  2 small abrasions on right shoulder. Skin: warm and dry with no masses, lesions, or rashes Neuro: Cranial nerves 2-12 grossly intact, sensation is normal throughout.  Equal strength in all 4 extremities with no focal neurologic deficits.   Psych: A&Ox3 with an appropriate affect.   Results for orders placed or performed during the hospital encounter of 06/10/22 (from the past 48 hour(s))  CBG monitoring, ED     Status: Abnormal   Collection Time: 06/10/22  8:16 AM  Result Value Ref Range   Glucose-Capillary 267 (H) 70 - 99 mg/dL    Comment: Glucose reference range applies only to samples taken after fasting for at least 8 hours.  Sample to Blood Bank     Status: None   Collection Time: 06/10/22  8:34 AM  Result Value Ref Range   Blood Bank Specimen SAMPLE AVAILABLE FOR TESTING    Sample Expiration      06/11/2022,2359 Performed at [facility], [place] [address] [place], [place] [address]   Comprehensive metabolic panel     Status: Abnormal   Collection Time: 06/10/22  8:35 AM  Result Value Ref Range   Sodium 134 (L) 135 - 145 mmol/L   Potassium 4.6 3.5 - 5.1 mmol/L   Chloride 100 98 - 111 mmol/L   CO2 18 (L) 22 - 32 mmol/L   Glucose, Bld 261 (H) 70 - 99 mg/dL    Comment: Glucose reference range applies only to samples taken after fasting for at least 8 hours.   BUN 33 (H) 8 - 23 mg/dL   Creatinine, Ser 2.05 (H) 0.61 - 1.24 mg/dL   Calcium 8.8 (L) 8.9 - 10.3 mg/dL   Total Protein 8.1 6.5 - 8.1 g/dL   Albumin 3.2 (L) 3.5 - 5.0 g/dL   AST 36 15 - 41 U/L   ALT 17 0 - 44 U/L   Alkaline Phosphatase 52 38 - 126 U/L   Total Bilirubin 0.9 0.3 - 1.2 mg/dL   GFR, Estimated 31 (L) >60 mL/min    Comment: (NOTE) Calculated  using the CKD-EPI Creatinine Equation (2021)    Anion gap 16 (H) 5 - 15    Comment: Performed at [facility], [place] [address] [place] [address]  CBC     Status: Abnormal   Collection Time: 06/10/22  8:35 AM  Result Value Ref Range   WBC 13.0 (H) 4.0 - 10.5 K/uL   RBC 4.45 4.22 - 5.81 MIL/uL   Hemoglobin 13.1 13.0 - 17.0 g/dL   HCT 39.9  39.0 - 52.0 %   MCV 89.7 80.0 - 100.0 fL   MCH 29.4 26.0 - 34.0 pg   MCHC 32.8 30.0 - 36.0 g/dL   RDW 17.0 (H) 11.5 - 15.5 %   Platelets 151 150 - 400 K/uL   nRBC 0.0 0.0 - 0.2 %    Comment: Performed at [facility] [address] [place], [place] [address]  Ethanol     Status: None   Collection Time: 06/10/22  8:35 AM  Result Value Ref Range   Alcohol, Ethyl (B) <10 <10 mg/dL    Comment: (NOTE) Lowest detectable limit for serum alcohol is 10 mg/dL.  For medical purposes only. Performed at [facility], [place] [address], [place], [place] [address]   Protime-INR     Status: Abnormal   Collection Time: 06/10/22  8:35 AM  Result Value Ref Range   Prothrombin Time 16.5 (H) 11.4 - 15.2 seconds   INR 1.3 (H) 0.8 - 1.2    Comment: (NOTE) INR goal varies based on device and disease states. Performed at [facility], [place] [address] [place], [place] [address]   I-Stat Chem 8, ED     Status: Abnormal   Collection Time: 06/10/22  8:43 AM  Result Value Ref Range   Sodium 136 135 - 145 mmol/L   Potassium 4.7 3.5 - 5.1 mmol/L   Chloride 103 98 - 111 mmol/L   BUN 36 (H) 8 - 23 mg/dL   Creatinine, Ser 2.00 (H) 0.61 - 1.24 mg/dL   Glucose, Bld 258 (H) 70 - 99 mg/dL    Comment: Glucose reference range applies only to samples taken after fasting for at least 8 hours.   Calcium, Ion 1.08 (L) 1.15 - 1.40 mmol/L   TCO2 21 (L) 22 - 32 mmol/L   Hemoglobin 13.3 13.0 - 17.0 g/dL   HCT 39.0 39.0 - 52.0 %   DG Knee Left Port  Result Date: 06/10/2022 CLINICAL DATA:  Fall.  The swelling and bruising. EXAM: PORTABLE LEFT  KNEE - 1-2 VIEW COMPARISON:  10/04/2016 FINDINGS: There is MEDIAL and patellofemoral compartment narrowing. No acute fracture or subluxation. No joint effusion. There is dense atherosclerotic calcification of the popliteal artery. IMPRESSION: Mild degenerative changes. No evidence for acute abnormality. Electronically Signed   By: Nolon Nations M.D.   On: 06/10/2022 09:31   CT CERVICAL SPINE WO CONTRAST  Result Date: 06/10/2022 CLINICAL DATA:  Fall with trauma to the head and neck. EXAM: CT CERVICAL SPINE WITHOUT CONTRAST TECHNIQUE: Multidetector CT imaging of the cervical spine was performed without intravenous contrast. Multiplanar CT image reconstructions were also generated. RADIATION DOSE REDUCTION: This exam was performed according to the departmental dose-optimization program which includes automated exposure control, adjustment of the mA and/or kV according to patient size and/or use of iterative reconstruction technique. COMPARISON:  None Available. FINDINGS: Alignment: Mild rotation of C1 on C2 which is favored to be positional. If the patient can turn the head from right to left this would rule out rotatory subluxation, which is not favored. Skull base and vertebrae: No evidence of fracture or focal lesion. Soft tissues and spinal canal: Vascular calcification. No traumatic soft tissue finding. Disc levels: Mild degenerative spondylosis with endplate osteophytes at C4-5 and C6-7. Facet osteoarthritis worse on the right than the left. No apparent canal stenosis. Bony foraminal narrowing on the right at C3-4, C4-5 and C5-6. Upper chest: Negative acutely.  See results of chest CT. Other: None  IMPRESSION: No acute or traumatic finding. Mild rotation of C1 on C2 which is favored to be positional. If the patient can turn the head from right to left, this would rule out rotatory subluxation, which is not favored. Electronically Signed   By: Nelson Chimes M.D.   On: 06/10/2022 09:29   CT MAXILLOFACIAL WO  CONTRAST  Result Date: 06/10/2022 CLINICAL DATA:  Fall with trauma to the face. EXAM: CT MAXILLOFACIAL WITHOUT CONTRAST TECHNIQUE: Multidetector CT imaging of the maxillofacial structures was performed. Multiplanar CT image reconstructions were also generated. RADIATION DOSE REDUCTION: This exam was performed according to the departmental dose-optimization program which includes automated exposure control, adjustment of the mA and/or kV according to patient size and/or use of iterative reconstruction technique. COMPARISON:  None Available. FINDINGS: Osseous: Comminuted nasal fractures with slight depression on the right. Old plate and screw repair of a left maxillary fracture. No other acute facial finding. Nasal septum bows towards the left but no septal fracture is established. Orbits: No orbital injury. Sinuses: No inflammatory or traumatic fluid present within the sinuses. Soft tissues: No other soft tissue injury evident. Limited intracranial: Negative IMPRESSION: 1. Comminuted nasal fractures with slight depression on the right. 2. Old plate and screw repair of a left maxillary fracture. Electronically Signed   By: Nelson Chimes M.D.   On: 06/10/2022 09:28   CT HEAD WO CONTRAST (5MM)  Result Date: 06/10/2022 CLINICAL DATA:  Fall with trauma to the head and face. Anticoagulated. EXAM: CT HEAD WITHOUT CONTRAST TECHNIQUE: Contiguous axial images were obtained from the base of the skull through the vertex without intravenous contrast. RADIATION DOSE REDUCTION: This exam was performed according to the departmental dose-optimization program which includes automated exposure control, adjustment of the mA and/or kV according to patient size and/or use of iterative reconstruction technique. COMPARISON:  None Available. FINDINGS: Brain: Mild age related volume loss. No evidence of old or acute infarction, mass lesion, hemorrhage, hydrocephalus or extra-axial collection. Mild chronic small-vessel ischemic change  of the cerebral hemispheric white matter, thalami and basal ganglia. Few scattered punctate benign calcifications are nonspecific and not likely significant presently. Vascular: There is atherosclerotic calcification of the major vessels at the base of the brain. Skull: Negative Sinuses/Orbits: Clear/normal Other: None IMPRESSION: No acute or traumatic finding. Mild age related volume loss. Mild chronic small-vessel ischemic change of the white matter, thalami and basal ganglia. Electronically Signed   By: Nelson Chimes M.D.   On: 06/10/2022 09:16   CT Chest W Contrast  Result Date: 06/10/2022 CLINICAL DATA:  Blunt trauma to the chest after a fall. EXAM: CT CHEST WITH CONTRAST TECHNIQUE: Multidetector CT imaging of the chest was performed during intravenous contrast administration. RADIATION DOSE REDUCTION: This exam was performed according to the departmental dose-optimization program which includes automated exposure control, adjustment of the mA and/or kV according to patient size and/or use of iterative reconstruction technique. CONTRAST:  12mL OMNIPAQUE IOHEXOL 350 MG/ML SOLN COMPARISON:  10/03/2021 FINDINGS: Cardiovascular: Heart size is normal. Previous median sternotomy and CABG. Aortic atherosclerosis without aneurysm or dissection. No pulmonary emboli. Mediastinum/Nodes: No mediastinal or hilar mass or lymphadenopathy. Small mediastinal nodes are stable. Lungs/Pleura: Background emphysema and pulmonary scarring. A previously seen right upper lobe density anteriorly has not enlarged since a parole and areas that were solid-appearing previously now show some aeration. These findings suggest that this is benign bronchial disease and pulmonary scarring. Overall maximal dimension remains approximately 1.5 cm. Additional continued follow-up would be suggested. Elsewhere, previously seen right  middle lobe collapse has resolved, with mild residual patchy density that could be scarring or minimal right middle  lobe pneumonia presently. Fairly extensive patchy bronchopneumonia previously seen in the right lower lobe has improved. There is some persistent patchy density in that region with bronchial opacification that could be due to scarring, residual or recurrent right lower lobe bronchopneumonia. Follow-up to clearing would be recommended. No pleural effusion on either side. Upper Abdomen: No abdominal finding. Musculoskeletal: No spinal or rib fracture. IMPRESSION: 1. No acute traumatic finding. 2. Previous median sternotomy and CABG. Aortic atherosclerosis without aneurysm or dissection. 3. Background emphysema and pulmonary scarring. 4. Previously seen right middle lobe collapse has resolved, with mild residual patchy density that could be scarring or minimal right middle lobe pneumonia presently. 5. Fairly extensive patchy bronchopneumonia previously seen in the right lower lobe has improved. There is some persistent patchy density in that region with bronchial opacification that could be due to scarring, residual or recurrent right lower lobe bronchopneumonia. Follow-up to clearing would be recommended. 6. A previously seen right upper lobe density anteriorly has not enlarged and areas that were solid-appearing previously show some aeration. These findings suggest that this is benign bronchial disease and pulmonary scarring. Overall maximal dimension remains approximately 1.5 cm. Additional/continued follow-up would be suggested. Aortic Atherosclerosis (ICD10-I70.0) and Emphysema (ICD10-J43.9). Electronically Signed   By: Nelson Chimes M.D.   On: 06/10/2022 09:15   DG Pelvis Portable  Result Date: 06/10/2022 CLINICAL DATA:  87 year old male status post fall. EXAM: PORTABLE PELVIS 1-2 VIEWS COMPARISON:  CT Abdomen and Pelvis 11/05/2017. FINDINGS: Portable AP semi upright view at 0835 hours. Femoral heads remain normally located. Left lateral iliac wing not completely included, but visible pelvis appears stable  and intact. Grossly intact proximal femurs. Bulky iliofemoral calcified atherosclerosis. Negative visible bowel gas. IMPRESSION: 1. No acute fracture or dislocation identified about the pelvis. 2. Calcified atherosclerosis. Electronically Signed   By: Genevie Ann M.D.   On: 06/10/2022 08:50   DG CHEST PORT 1 VIEW  Result Date: 06/10/2022 CLINICAL DATA:  Fall EXAM: PORTABLE CHEST 1 VIEW COMPARISON:  02/21/2022 IMPRESSION: Hyperinflated lungs with pulmonary vascular congestion. Electronically Signed   By: Sammie Bench M.D.   On: 06/10/2022 08:49      Assessment/Plan Fall Hypotension - resolved with IVFs hydration in the trauma bay Comminuted nasal fracture with epistaxis - epistaxis appears to be originally etiology or blood loss on face.  Hgb 13, previous baselines between 10-12.  Outpatient ENT follow up A on CKD - baseline cr appears to be around 1.3-1.5.  Cr 2 today.  His hgb is always higher than usual.  Given initial hypotension and good response to fluids, suspect some component of dehydration. COPD/PNA/emphysema  - RLL PNA noted on CT chest but this was improved from last imaging.   He has a chronic cough and some "chronic PNA" per his granddaughter.  Given no fevers or acute findings, suspect this is not acute and does not need intervention.  Hopefully can wean O2, but does have supplemental O2 at home if needed Left knee pain - ecchymosis present, but film negative, mobilize CAD DM Old facial fx, s/p fixation H/O HTN H/O  A flutter, s/p ablation FEN - may have diet ID - tDAP given in trauma bay Admit - Patient is stable from a trauma standpoint.  Can likely DC home if mobilizes and does well in the trauma bay.  Discussed with Dr. Sherry Ruffing.  If he struggles with any of  these things, may need medical admission at that time for observation.  I reviewed last 24 h vitals and pain scores, last 48 h intake and output, last 24 h labs and trends, and last 24 h imaging results.  Henreitta Cea, [facility] 06/10/2022, 9:36 AM Please see Amion for pager number during day hours 7:00am-4:30pm or 7:00am -11:30am on weekends

## 2022-06-10 NOTE — Progress Notes (Signed)
Orthopedic Tech Progress Note Patient Details:  [name] 12-12-1934 [id number]  Level 1 trauma    Patient ID: [name], male   DOB: 10/27/1934, 87 y.o.   MRN: [id number]  [name] 06/10/2022, 8:54 AM

## 2022-06-10 NOTE — ED Triage Notes (Signed)
Pt arrived POV from home c/o a fall. Pt states it happened around 11-12am this morning but he did not call his daughter till 61am. Per daughter pt is also having left sided weakness that is worse than usual but per family LKW was a year ago. Pt is also hypotensive in triage.

## 2022-06-10 NOTE — H&P (Addendum)
History and Physical    Patient: [name] BJS:[id number] DOB: [birth year] DOA: 06/10/2022 DOS: the patient was seen and examined on 06/10/2022 PCP: Susy Frizzle, MD  Patient coming from: Home via private vehicle.  Chief Complaint:  Chief Complaint  Patient presents with   Fall   HPI: BRICK KETCHER is a 87 y.o. male with medical history significant of hypertension, hyperlipidemia, CAD, atrial flutter/SVT s/p ablation, and chronic home oxygen at night, DM type II, COPD, CKD, and tobacco abuse who presents after having a fall.  Patient lives at home alone, but granddaughter and son live within 5 minutes.  At baseline patient ambulates without need of assistance and contact history is obtained from the patient's granddaughter at bedside as he has significant hearing loss.  Patient had reported falling sometime around 11 PM-12 AM last night..  Patient does not recall how he fell but he did hit his face.  He was able to get up and walk to the bedroom where he possibly slept before having to crawl out of his bedroom to reach the phone and call for help around 6 AM this morning.  He complains of having pain in his left shoulder and left knee.  Previous right upper lobe spiculated lung nodule concerning for primary bronchogenic carcinoma was was not ever biopsied due to patient's overall frail condition at baseline.  She reports that he still continues to smoke on a regular basis and has no goal of quitting.  He also is on hydrocodone due to chronic back pain.   In the emergency department patient was seen as a level 1 trauma.  Patient was noted to be afebrile with respirations 14-29, blood pressure as low as 72/48 with improvement with IV fluids, O2 saturations as low as 86% with improvement on 3 L of nasal cannula oxygen.  X-rays of the chest, knee, and pelvis did not note any acute fractures.  Labs were significant for WBC 13, sodium 134, CO2 18, BUN 33, creatinine 2.05, glucose 261, and  anion gap 16.  CT maxillofacial imaging was significant for communicated nasal fractures with slight depression on the right with old plate and screw repair of left maxillary fracture.  CT imaging of the chest gave concern for possible minimal right middle and/or lower lobe pneumonia.  Right upper lobe density was noted to be unchanged in size.  Patient has been given empiric antibiotics of Rocephin, azithromycin, and Tdap booster.   Review of Systems: As mentioned in the history of present illness. All other systems reviewed and are negative. Past Medical History:  Diagnosis Date   Acute venous embolism and thrombosis of unspecified deep vessels of lower extremity    Atrial flutter (HCC)    and SVT that is post ablation   CAD (coronary artery disease)    Chronic kidney disease    stones   Coronary atherosclerosis 11/19/2008   Qualifier: Diagnosis of  By: Stanford Breed, MD, Kandyce Rud    Dyslipidemia    GERD (gastroesophageal reflux disease)    HTN (hypertension)    Shortness of breath    on exertion   Past Surgical History:  Procedure Laterality Date   CARDIAC CATHETERIZATION     s/p cardiac catheterization and bypass surgery as well as EGD with dilation x3, left lower extremity vein stripping   CORONARY ARTERY BYPASS GRAFT     ESOPHAGOGASTRODUODENOSCOPY N/A 12/06/2013   Procedure: ESOPHAGOGASTRODUODENOSCOPY (EGD);  Surgeon: Lear Ng, MD;  Location: [facility];  Service: Endoscopy;  Laterality: N/A;   left knee anthroscopy     SECONDARY CLOSURE ARM     VIDEO BRONCHOSCOPY Bilateral 01/14/2018   Procedure: VIDEO BRONCHOSCOPY WITHOUT FLUORO;  Surgeon: Tanda Rockers, MD;  Location: [facility];  Service: Cardiopulmonary;  Laterality: Bilateral;   Social History:  reports that he has been smoking cigarettes. He has a 60.00 pack-year smoking history. He has never used smokeless tobacco. He reports that he does not drink alcohol and does not use drugs.  Allergies   Allergen Reactions   Celebrex [Celecoxib] Rash and Other (See Comments)    Blisters, also   Tape Other (See Comments)    EKG leads leave red marks and PLEASE USE COBAN WRAP!! Skin tears and bruises easily    History reviewed. No pertinent family history.  Prior to Admission medications   Medication Sig Start Date End Date Taking? Authorizing Provider  acetaminophen (TYLENOL) 325 MG tablet Take 325-650 mg by mouth every 6 (six) hours as needed for mild pain or headache.    [provider]  albuterol (PROVENTIL) (2.5 MG/3ML) 0.083% nebulizer solution Take 3 mLs (2.5 mg total) by nebulization every 6 (six) hours as needed for wheezing or shortness of breath. 10/08/21   Ghimire, Henreitta Leber, MD  aspirin 325 MG tablet Take 325 mg by mouth daily.    [provider]  blood glucose meter kit and supplies Dispense based on patient and insurance preference. Use up to four times daily as directed. (FOR ICD-10 E10.9, E11.9). 08/18/20   Susy Frizzle, MD  budesonide-formoterol (SYMBICORT) 160-4.5 MCG/ACT inhaler Inhale 2 puffs into the lungs 2 (two) times daily. Patient not taking: Reported on 02/27/2022 10/08/21   Jonetta Osgood, MD  clobetasol cream (TEMOVATE) 0.05 % Please specify directions, refills and quantity 08/27/18   Susy Frizzle, MD  clotrimazole (LOTRIMIN) 1 % cream Apply 1 application topically 2 (two) times daily. Apply to the head of the penis 07/03/20   Jenna Luo T, MD  glucose blood (ONETOUCH ULTRA) test strip USE TO CHECK BLOOD SUGAR 4 TO 6 TIMES DAILY. E11.9 02/18/22   Susy Frizzle, MD  guaiFENesin (MUCINEX) 600 MG 12 hr tablet Take 1 tablet (600 mg total) by mouth 2 (two) times daily. 10/08/21 10/08/22  Ghimire, Henreitta Leber, MD  HYDROcodone-acetaminophen (NORCO) 5-325 MG tablet Take 1 tablet by mouth every 6 (six) hours as needed for moderate pain. 01/07/22   Susy Frizzle, MD  hydrOXYzine (ATARAX) 25 MG tablet Take 1 tablet (25 mg total) by mouth 3  (three) times daily as needed for itching. 04/26/22   Susy Frizzle, MD  Insulin Syringe-Needle U-100 (BD INSULIN SYRINGE U/F) 31G X 5/16" 0.3 ML MISC USE WITH NOVOLIN TWICE DAY WITH A MEAL 03/15/20   Susy Frizzle, MD  ipratropium-albuterol (DUONEB) 0.5-2.5 (3) MG/3ML SOLN Take 3 mLs by nebulization 3 (three) times daily. Patient not taking: Reported on 02/27/2022 10/08/21   Jonetta Osgood, MD  nitroGLYCERIN (NITROSTAT) 0.4 MG SL tablet Place 0.4 mg under the tongue every 5 (five) minutes as needed for chest pain. 11/14/11   Lelon Perla, MD  NOVOLIN 70/30 (70-30) 100 UNIT/ML injection INJECT 8 UNITS SQ IN THE MORNING AND INJECT 12 UNITS SQ AT BEDTIME Patient taking differently: 8-12 Units See admin instructions. Inject 8 units into the skin in the morning and 12 units at bedtime 05/22/21   Susy Frizzle, MD  nystatin (MYCOSTATIN) 100000 UNIT/ML suspension Take 5 mLs (500,000 Units total) by  mouth 4 (four) times daily. Patient not taking: Reported on 02/27/2022 10/15/21   Susy Frizzle, MD  OneTouch Delica Lancets 01B MISC USE TO CHECK BLOOD SUAGR 3 TIMES A DAY DX:E11.9 02/19/22   Susy Frizzle, MD  OXYGEN Inhale 4 L/min into the lungs at bedtime.    [provider]  pantoprazole (PROTONIX) 40 MG tablet TAKE 1 TABLET BY MOUTH EVERY DAY 01/10/22   Susy Frizzle, MD  pravastatin (PRAVACHOL) 40 MG tablet Take 1 tablet (40 mg total) by mouth daily. Patient taking differently: Take 40 mg by mouth at bedtime. 09/13/21   Susy Frizzle, MD  predniSONE (DELTASONE) 20 MG tablet 3 tabs poqday 1-2, 2 tabs poqday 3-4, 1 tab poqday 5-6 Patient not taking: Reported on 02/27/2022 02/21/22   Susy Frizzle, MD  tamsulosin (FLOMAX) 0.4 MG CAPS capsule TAKE 1 CAPSULE BY MOUTH EVERY DAY 04/15/22   Susy Frizzle, MD  tiotropium (SPIRIVA HANDIHALER) 18 MCG inhalation capsule Place 1 capsule (18 mcg total) into inhaler and inhale daily. Patient not taking: Reported on 02/27/2022  10/08/21 10/08/22  Jonetta Osgood, MD  triamcinolone cream (KENALOG) 0.1 % APPLY TO AFFECTED AREA TWICE A DAY 02/21/22   Susy Frizzle, MD    Physical Exam: Vitals:   06/10/22 0911 06/10/22 0915 06/10/22 0930 06/10/22 0945  BP:  123/61 135/67 (!) 120/58  Pulse:  86 81 84  Resp:  (!) 29 14 (!) 22  Temp:      SpO2:  96% 94% 90%  Weight: (S) 52.2 kg     Height: (S) _0  (1.88 m)       Constitutional: Thin elderly male who appears to be in some discomfort but able to follow commands Eyes: PERRL, stitches as noted of the left lateral eye. ENMT: Mucous membranes are moist.  Stitches present on the right side of the chin.  Hard of hearing. Neck: normal, supple  Respiratory: rhonchi noted of the right lung field without significant wheezes.  O2 saturations currently maintained on 3 L nasal cannula oxygen. Cardiovascular: Regular rate and rhythm, no murmurs / rubs / gallops. No extremity edema. 2+ pedal pulses. No carotid bruits.  Abdomen: no tenderness, no masses palpated. Bowel sounds positive.  Musculoskeletal: no clubbing / cyanosis.  Tenderness palpation of the left knee and shoulder. Skin: no rashes, lesions, ulcers. No induration Neurologic: CN 2-12 grossly intact.  Able to move all extremities. Psychiatric: Normal judgment and insight. Alert and oriented x 3. Normal mood.   Data Reviewed:  Reviewed labs, imaging and pertinent records as noted above in HPI.  Assessment and Plan: Acute on chronic respiratory failure with hypoxia secondary to suspected community-acquired pneumonia Patient was noted to be hypoxic down to 86% with improvement on 3 L of nasal cannula oxygen.  CT imaging concerning for resolution of right middle lobe collapse with residual patchy density concerning for scarring or right middle lobe pneumonia, and patchy density in the region with bronchial opacification that could be due to scarring or residual or recurrent right lower lobe bronchopneumonia.   -Admit  to a progressive bed -Continuous pulse oximetry with nasal cannula oxygen maintain O2 saturation greater than 92% -Aspiration precautions with elevation head of bed -Incentive spirometry and flutter valve -Check procalcitonin -Empiric antibiotics of Rocephin and azithromycin -Mucinex  SIRS lactic acidosis Acute.  Patient was noted to be tachypneic with WBC elevated at 13 and initial lactic acid 4.8.  In the setting of unclear if symptoms are related  to the patient's recent fall with delayed presentation versus possible right-sided pneumonia. -Admit to a progressive bed -Follow-up blood cultures -Check procalcitonin -Continue empiric antibiotics as noted above -Trend lactic acid levels  Transient hypotension Blood pressures initially were reviewed as low as 72/48.  After initial fluid bolus patient's blood pressures were continuing. -Goal MAP greater than 65 -Adjust IV fluids as needed  Acute kidney injury superimposed on chronic kidney disease stage IIIa On admission patient creatinine elevated 2.05 with BUN 33.  Baseline creatinine previously noted to be around 1.3. -CK given pulm -Continue normal saline IV fluids at 100 mL/h -Recheck kidney function in a.m.  Nasal fracture secondary to fall at home Patient was noted to have a fall at home.  Maxillofacial imaging noted communicated nasal fractures with slight depression on the right. -PT/OT to evaluate and treat -Would recommend patient to follow-up with ENT in outpatient setting  Diabetes mellitus type 2, with hyperglycemia On admission glucose elevated up to 258.  Last available hemoglobin A1c 7.5 on 07/2021.  Home medication regimen includes 70/30 insulin 12 units a.m. and 8 units in the evening. -Continue home medication regimen -CBGs before every meal see with sensitive SSI -Adjust regimen as warranted  Chronic back pain -Continue hydrocodone as needed for pain  BPH -Continue Flomax  History of right upper lobe  nodule Seen on CT imaging thought to be concerning for primary bronchogenic carcinoma.  PET scan was noted to be positive but no further workup including bronchoscopy/biopsy to warranted.  Tobacco abuse Patient continues to smoke on a regular basis and has no willing wanting to stop at this time.  Hyperlipidemia -Continue pravastatin  GERD -Continue Protonix  DVT prophylaxis: Lovenox Advance Care Planning:   Code Status: DNR   Consults:   Family Communication: Granddaughter updated at bedside  Severity of Illness: The appropriate patient status for this patient is INPATIENT. Inpatient status is judged to be reasonable and necessary in order to provide the required intensity of service to ensure the patient's safety. The patient's presenting symptoms, physical exam findings, and initial radiographic and laboratory data in the context of their chronic comorbidities is felt to place them at high risk for further clinical deterioration. Furthermore, it is not anticipated that the patient will be medically stable for discharge from the hospital within 2 midnights of admission.   * I certify that at the point of admission it is my clinical judgment that the patient will require inpatient hospital care spanning beyond 2 midnights from the point of admission due to high intensity of service, high risk for further deterioration and high frequency of surveillance required.*  Author: Norval Morton, MD 06/10/2022 10:32 AM  For on call review [email or web address].

## 2022-06-10 NOTE — ED Notes (Signed)
Trauma Response Nurse Documentation   [name] is a 87 y.o. male arriving to [facility] ED via POV  On No antithrombotic. Trauma was activated as a Level 1 by Charge RN based on the following trauma criteria Anytime Systolic Blood Pressure < 90. Trauma team at the bedside on patient arrival.   Patient cleared for CT by Dr. Rosalita Levan. Pt transported to CT with trauma response nurse present to monitor. RN remained with the patient throughout their absence from the department for clinical observation.   GCS 15.  History   Past Medical History:  Diagnosis Date   Acute venous embolism and thrombosis of unspecified deep vessels of lower extremity    Atrial flutter (HCC)    and SVT that is post ablation   CAD (coronary artery disease)    Chronic kidney disease    stones   Coronary atherosclerosis 11/19/2008   Qualifier: Diagnosis of  By: Stanford Breed, MD, Kandyce Rud    Dyslipidemia    GERD (gastroesophageal reflux disease)    HTN (hypertension)    Shortness of breath    on exertion     Past Surgical History:  Procedure Laterality Date   CARDIAC CATHETERIZATION     s/p cardiac catheterization and bypass surgery as well as EGD with dilation x3, left lower extremity vein stripping   CORONARY ARTERY BYPASS GRAFT     ESOPHAGOGASTRODUODENOSCOPY N/A 12/06/2013   Procedure: ESOPHAGOGASTRODUODENOSCOPY (EGD);  Surgeon: Lear Ng, MD;  Location: [facility];  Service: Endoscopy;  Laterality: N/A;   left knee anthroscopy     SECONDARY CLOSURE ARM     VIDEO BRONCHOSCOPY Bilateral 01/14/2018   Procedure: VIDEO BRONCHOSCOPY WITHOUT FLUORO;  Surgeon: Tanda Rockers, MD;  Location: [facility];  Service: Cardiopulmonary;  Laterality: Bilateral;       Initial Focused Assessment (If applicable, or please see trauma documentation): Airway- CLear Breathing- Unlabored Circulation-- BP in 70s on arrival to triage. Moderate amount of dried blood on face, no other  injuries noted.  GCS - 15  CT's Completed:   CT Head, CT Maxillofacial, CT C-Spine, CT Chest w/ contrast, and CT abdomen/pelvis w/ contrast   Interventions:  IV Fluids Labs Xrays CT scans Admit  Plan for disposition:  Admission to floor    Event Summary:    Bedside handoff with ED RN Claiborne Billings, RN.    [name]  Trauma Response RN  Please call TRN at [phone] for further assistance.

## 2022-06-10 NOTE — ED Provider Notes (Signed)
[facility] EMERGENCY DEPARTMENT Provider Note   CSN: [id number] Arrival date & time: 06/10/22  0734     History  Chief Complaint  Patient presents with   Lytle Michaels    [name] is a 87 y.o. male.  The history is provided by the patient and medical records. No language interpreter was used.  Fall This is a new problem. The current episode started 6 to 12 hours ago. The problem occurs rarely. The problem has not changed since onset.Pertinent negatives include no chest pain, no abdominal pain, no headaches and no shortness of breath. Nothing aggravates the symptoms. Nothing relieves the symptoms. He has tried nothing for the symptoms. The treatment provided no relief.       Home Medications Prior to Admission medications   Medication Sig Start Date End Date Taking? Authorizing Provider  acetaminophen (TYLENOL) 325 MG tablet Take 325-650 mg by mouth every 6 (six) hours as needed for mild pain or headache.    [provider]  albuterol (PROVENTIL) (2.5 MG/3ML) 0.083% nebulizer solution Take 3 mLs (2.5 mg total) by nebulization every 6 (six) hours as needed for wheezing or shortness of breath. 10/08/21   Ghimire, Henreitta Leber, MD  aspirin 325 MG tablet Take 325 mg by mouth daily.    [provider]  blood glucose meter kit and supplies Dispense based on patient and insurance preference. Use up to four times daily as directed. (FOR ICD-10 E10.9, E11.9). 08/18/20   Susy Frizzle, MD  budesonide-formoterol (SYMBICORT) 160-4.5 MCG/ACT inhaler Inhale 2 puffs into the lungs 2 (two) times daily. Patient not taking: Reported on 02/27/2022 10/08/21   Jonetta Osgood, MD  clobetasol cream (TEMOVATE) 0.05 % Please specify directions, refills and quantity 08/27/18   Susy Frizzle, MD  clotrimazole (LOTRIMIN) 1 % cream Apply 1 application topically 2 (two) times daily. Apply to the head of the penis 07/03/20   Jenna Luo T, MD  glucose blood (ONETOUCH  ULTRA) test strip USE TO CHECK BLOOD SUGAR 4 TO 6 TIMES DAILY. E11.9 02/18/22   Susy Frizzle, MD  guaiFENesin (MUCINEX) 600 MG 12 hr tablet Take 1 tablet (600 mg total) by mouth 2 (two) times daily. 10/08/21 10/08/22  Ghimire, Henreitta Leber, MD  HYDROcodone-acetaminophen (NORCO) 5-325 MG tablet Take 1 tablet by mouth every 6 (six) hours as needed for moderate pain. 01/07/22   Susy Frizzle, MD  hydrOXYzine (ATARAX) 25 MG tablet Take 1 tablet (25 mg total) by mouth 3 (three) times daily as needed for itching. 04/26/22   Susy Frizzle, MD  Insulin Syringe-Needle U-100 (BD INSULIN SYRINGE U/F) 31G X 5/16" 0.3 ML MISC USE WITH NOVOLIN TWICE DAY WITH A MEAL 03/15/20   Susy Frizzle, MD  ipratropium-albuterol (DUONEB) 0.5-2.5 (3) MG/3ML SOLN Take 3 mLs by nebulization 3 (three) times daily. Patient not taking: Reported on 02/27/2022 10/08/21   Jonetta Osgood, MD  nitroGLYCERIN (NITROSTAT) 0.4 MG SL tablet Place 0.4 mg under the tongue every 5 (five) minutes as needed for chest pain. 11/14/11   Lelon Perla, MD  NOVOLIN 70/30 (70-30) 100 UNIT/ML injection INJECT 8 UNITS SQ IN THE MORNING AND INJECT 12 UNITS SQ AT BEDTIME Patient taking differently: 8-12 Units See admin instructions. Inject 8 units into the skin in the morning and 12 units at bedtime 05/22/21   Susy Frizzle, MD  nystatin (MYCOSTATIN) 100000 UNIT/ML suspension Take 5 mLs (500,000 Units total) by mouth 4 (four) times daily. Patient  not taking: Reported on 02/27/2022 10/15/21   Susy Frizzle, MD  OneTouch Delica Lancets 16F MISC USE TO CHECK BLOOD SUAGR 3 TIMES A DAY DX:E11.9 02/19/22   Susy Frizzle, MD  OXYGEN Inhale 4 L/min into the lungs at bedtime.    [provider]  pantoprazole (PROTONIX) 40 MG tablet TAKE 1 TABLET BY MOUTH EVERY DAY 01/10/22   Susy Frizzle, MD  pravastatin (PRAVACHOL) 40 MG tablet Take 1 tablet (40 mg total) by mouth daily. Patient taking differently: Take 40 mg by mouth at bedtime.  09/13/21   Susy Frizzle, MD  predniSONE (DELTASONE) 20 MG tablet 3 tabs poqday 1-2, 2 tabs poqday 3-4, 1 tab poqday 5-6 Patient not taking: Reported on 02/27/2022 02/21/22   Susy Frizzle, MD  tamsulosin (FLOMAX) 0.4 MG CAPS capsule TAKE 1 CAPSULE BY MOUTH EVERY DAY 04/15/22   Susy Frizzle, MD  tiotropium (SPIRIVA HANDIHALER) 18 MCG inhalation capsule Place 1 capsule (18 mcg total) into inhaler and inhale daily. Patient not taking: Reported on 02/27/2022 10/08/21 10/08/22  Jonetta Osgood, MD  triamcinolone cream (KENALOG) 0.1 % APPLY TO AFFECTED AREA TWICE A DAY 02/21/22   Susy Frizzle, MD      Allergies    Celebrex [celecoxib] and Tape    Review of Systems   Review of Systems  Constitutional:  Negative for chills, fatigue and fever.  HENT:  Negative for congestion.   Eyes:  Negative for visual disturbance.  Respiratory:  Positive for cough. Negative for chest tightness, shortness of breath and wheezing.   Cardiovascular:  Negative for chest pain, palpitations and leg swelling.  Gastrointestinal:  Negative for abdominal pain, constipation, diarrhea, nausea and vomiting.  Genitourinary:  Negative for dysuria and flank pain.  Musculoskeletal:  Negative for back pain, neck pain and neck stiffness.  Skin:  Positive for wound. Negative for rash.  Neurological:  Negative for seizures, weakness (no weakness on my exam or traumas exam initially), light-headedness, numbness and headaches.  Psychiatric/Behavioral:  Positive for confusion (posssibly more confused per family). Negative for agitation and behavioral problems.   All other systems reviewed and are negative.   Physical Exam Updated Vital Signs BP (!) 72/48   Pulse 99   Temp (!) 97.4 F (36.3 C)   Resp (!) 28   Wt 58 kg   SpO2 (!) 86%   BMI 16.42 kg/m  Physical Exam Vitals and nursing note reviewed.  Constitutional:      General: He is not in acute distress.    Appearance: He is well-developed. He is not  ill-appearing, toxic-appearing or diaphoretic.  HENT:     Head: Abrasion and laceration present.   Eyes:     Extraocular Movements: Extraocular movements intact.     Conjunctiva/sclera: Conjunctivae normal.  Cardiovascular:     Rate and Rhythm: Normal rate and regular rhythm.     Heart sounds: No murmur heard. Pulmonary:     Effort: Pulmonary effort is normal. No respiratory distress.     Breath sounds: Rhonchi present. No wheezing or rales.  Chest:     Chest wall: No tenderness.  Abdominal:     General: Abdomen is flat.     Palpations: Abdomen is soft.     Tenderness: There is no abdominal tenderness. There is no right CVA tenderness, left CVA tenderness, guarding or rebound.  Musculoskeletal:        General: Tenderness present. No swelling.     Cervical back: Neck supple. No  tenderness.  Skin:    General: Skin is warm and dry.     Capillary Refill: Capillary refill takes less than 2 seconds.     Findings: No erythema.  Neurological:     General: No focal deficit present.     Mental Status: He is alert.     Sensory: No sensory deficit.     Motor: No weakness.  Psychiatric:        Mood and Affect: Mood normal.     ED Results / Procedures / Treatments   Labs (all labs ordered are listed, but only abnormal results are displayed) Labs Reviewed  COMPREHENSIVE METABOLIC PANEL - Abnormal; Notable for the following components:      Result Value   Sodium 134 (*)    CO2 18 (*)    Glucose, Bld 261 (*)    BUN 33 (*)    Creatinine, Ser 2.05 (*)    Calcium 8.8 (*)    Albumin 3.2 (*)    GFR, Estimated 31 (*)    Anion gap 16 (*)    All other components within normal limits  CBC - Abnormal; Notable for the following components:   WBC 13.0 (*)    RDW 17.0 (*)    All other components within normal limits  LACTIC ACID, PLASMA - Abnormal; Notable for the following components:   Lactic Acid, Venous 4.8 (*)    All other components within normal limits  PROTIME-INR - Abnormal;  Notable for the following components:   Prothrombin Time 16.5 (*)    INR 1.3 (*)    All other components within normal limits  CBG MONITORING, ED - Abnormal; Notable for the following components:   Glucose-Capillary 267 (*)    All other components within normal limits  I-STAT CHEM 8, ED - Abnormal; Notable for the following components:   BUN 36 (*)    Creatinine, Ser 2.00 (*)    Glucose, Bld 258 (*)    Calcium, Ion 1.08 (*)    TCO2 21 (*)    All other components within normal limits  RESP PANEL BY RT-PCR (RSV, FLU A&B, COVID)  RVPGX2  ETHANOL  URINALYSIS, ROUTINE W REFLEX MICROSCOPIC  SAMPLE TO BLOOD BANK    EKG None  Radiology DG Knee Left Port  Result Date: 06/10/2022 CLINICAL DATA:  Fall.  The swelling and bruising. EXAM: PORTABLE LEFT KNEE - 1-2 VIEW COMPARISON:  10/04/2016 FINDINGS: There is MEDIAL and patellofemoral compartment narrowing. No acute fracture or subluxation. No joint effusion. There is dense atherosclerotic calcification of the popliteal artery. IMPRESSION: Mild degenerative changes. No evidence for acute abnormality. Electronically Signed   By: Nolon Nations M.D.   On: 06/10/2022 09:31   CT CERVICAL SPINE WO CONTRAST  Result Date: 06/10/2022 CLINICAL DATA:  Fall with trauma to the head and neck. EXAM: CT CERVICAL SPINE WITHOUT CONTRAST TECHNIQUE: Multidetector CT imaging of the cervical spine was performed without intravenous contrast. Multiplanar CT image reconstructions were also generated. RADIATION DOSE REDUCTION: This exam was performed according to the departmental dose-optimization program which includes automated exposure control, adjustment of the mA and/or kV according to patient size and/or use of iterative reconstruction technique. COMPARISON:  None Available. FINDINGS: Alignment: Mild rotation of C1 on C2 which is favored to be positional. If the patient can turn the head from right to left this would rule out rotatory subluxation, which is not  favored. Skull base and vertebrae: No evidence of fracture or focal lesion. Soft tissues and spinal canal:  Vascular calcification. No traumatic soft tissue finding. Disc levels: Mild degenerative spondylosis with endplate osteophytes at C4-5 and C6-7. Facet osteoarthritis worse on the right than the left. No apparent canal stenosis. Bony foraminal narrowing on the right at C3-4, C4-5 and C5-6. Upper chest: Negative acutely.  See results of chest CT. Other: None IMPRESSION: No acute or traumatic finding. Mild rotation of C1 on C2 which is favored to be positional. If the patient can turn the head from right to left, this would rule out rotatory subluxation, which is not favored. Electronically Signed   By: Nelson Chimes M.D.   On: 06/10/2022 09:29   CT MAXILLOFACIAL WO CONTRAST  Result Date: 06/10/2022 CLINICAL DATA:  Fall with trauma to the face. EXAM: CT MAXILLOFACIAL WITHOUT CONTRAST TECHNIQUE: Multidetector CT imaging of the maxillofacial structures was performed. Multiplanar CT image reconstructions were also generated. RADIATION DOSE REDUCTION: This exam was performed according to the departmental dose-optimization program which includes automated exposure control, adjustment of the mA and/or kV according to patient size and/or use of iterative reconstruction technique. COMPARISON:  None Available. FINDINGS: Osseous: Comminuted nasal fractures with slight depression on the right. Old plate and screw repair of a left maxillary fracture. No other acute facial finding. Nasal septum bows towards the left but no septal fracture is established. Orbits: No orbital injury. Sinuses: No inflammatory or traumatic fluid present within the sinuses. Soft tissues: No other soft tissue injury evident. Limited intracranial: Negative IMPRESSION: 1. Comminuted nasal fractures with slight depression on the right. 2. Old plate and screw repair of a left maxillary fracture. Electronically Signed   By: Nelson Chimes M.D.   On:  06/10/2022 09:28   CT HEAD WO CONTRAST (5MM)  Result Date: 06/10/2022 CLINICAL DATA:  Fall with trauma to the head and face. Anticoagulated. EXAM: CT HEAD WITHOUT CONTRAST TECHNIQUE: Contiguous axial images were obtained from the base of the skull through the vertex without intravenous contrast. RADIATION DOSE REDUCTION: This exam was performed according to the departmental dose-optimization program which includes automated exposure control, adjustment of the mA and/or kV according to patient size and/or use of iterative reconstruction technique. COMPARISON:  None Available. FINDINGS: Brain: Mild age related volume loss. No evidence of old or acute infarction, mass lesion, hemorrhage, hydrocephalus or extra-axial collection. Mild chronic small-vessel ischemic change of the cerebral hemispheric white matter, thalami and basal ganglia. Few scattered punctate benign calcifications are nonspecific and not likely significant presently. Vascular: There is atherosclerotic calcification of the major vessels at the base of the brain. Skull: Negative Sinuses/Orbits: Clear/normal Other: None IMPRESSION: No acute or traumatic finding. Mild age related volume loss. Mild chronic small-vessel ischemic change of the white matter, thalami and basal ganglia. Electronically Signed   By: Nelson Chimes M.D.   On: 06/10/2022 09:16   CT Chest W Contrast  Result Date: 06/10/2022 CLINICAL DATA:  Blunt trauma to the chest after a fall. EXAM: CT CHEST WITH CONTRAST TECHNIQUE: Multidetector CT imaging of the chest was performed during intravenous contrast administration. RADIATION DOSE REDUCTION: This exam was performed according to the departmental dose-optimization program which includes automated exposure control, adjustment of the mA and/or kV according to patient size and/or use of iterative reconstruction technique. CONTRAST:  5m OMNIPAQUE IOHEXOL 350 MG/ML SOLN COMPARISON:  10/03/2021 FINDINGS: Cardiovascular: Heart size is  normal. Previous median sternotomy and CABG. Aortic atherosclerosis without aneurysm or dissection. No pulmonary emboli. Mediastinum/Nodes: No mediastinal or hilar mass or lymphadenopathy. Small mediastinal nodes are stable. Lungs/Pleura: Background emphysema and  pulmonary scarring. A previously seen right upper lobe density anteriorly has not enlarged since a parole and areas that were solid-appearing previously now show some aeration. These findings suggest that this is benign bronchial disease and pulmonary scarring. Overall maximal dimension remains approximately 1.5 cm. Additional continued follow-up would be suggested. Elsewhere, previously seen right middle lobe collapse has resolved, with mild residual patchy density that could be scarring or minimal right middle lobe pneumonia presently. Fairly extensive patchy bronchopneumonia previously seen in the right lower lobe has improved. There is some persistent patchy density in that region with bronchial opacification that could be due to scarring, residual or recurrent right lower lobe bronchopneumonia. Follow-up to clearing would be recommended. No pleural effusion on either side. Upper Abdomen: No abdominal finding. Musculoskeletal: No spinal or rib fracture. IMPRESSION: 1. No acute traumatic finding. 2. Previous median sternotomy and CABG. Aortic atherosclerosis without aneurysm or dissection. 3. Background emphysema and pulmonary scarring. 4. Previously seen right middle lobe collapse has resolved, with mild residual patchy density that could be scarring or minimal right middle lobe pneumonia presently. 5. Fairly extensive patchy bronchopneumonia previously seen in the right lower lobe has improved. There is some persistent patchy density in that region with bronchial opacification that could be due to scarring, residual or recurrent right lower lobe bronchopneumonia. Follow-up to clearing would be recommended. 6. A previously seen right upper lobe density  anteriorly has not enlarged and areas that were solid-appearing previously show some aeration. These findings suggest that this is benign bronchial disease and pulmonary scarring. Overall maximal dimension remains approximately 1.5 cm. Additional/continued follow-up would be suggested. Aortic Atherosclerosis (ICD10-I70.0) and Emphysema (ICD10-J43.9). Electronically Signed   By: Nelson Chimes M.D.   On: 06/10/2022 09:15   DG Pelvis Portable  Result Date: 06/10/2022 CLINICAL DATA:  87 year old male status post fall. EXAM: PORTABLE PELVIS 1-2 VIEWS COMPARISON:  CT Abdomen and Pelvis 11/05/2017. FINDINGS: Portable AP semi upright view at 0835 hours. Femoral heads remain normally located. Left lateral iliac wing not completely included, but visible pelvis appears stable and intact. Grossly intact proximal femurs. Bulky iliofemoral calcified atherosclerosis. Negative visible bowel gas. IMPRESSION: 1. No acute fracture or dislocation identified about the pelvis. 2. Calcified atherosclerosis. Electronically Signed   By: Genevie Ann M.D.   On: 06/10/2022 08:50   DG CHEST PORT 1 VIEW  Result Date: 06/10/2022 CLINICAL DATA:  Fall EXAM: PORTABLE CHEST 1 VIEW COMPARISON:  02/21/2022 IMPRESSION: Hyperinflated lungs with pulmonary vascular congestion. Electronically Signed   By: Sammie Bench M.D.   On: 06/10/2022 08:49    Procedures .Marland KitchenLaceration Repair  Date/Time: 06/10/2022 11:56 AM  Performed by: Courtney Paris, MD Authorized by: Courtney Paris, MD   Consent:    Consent obtained:  Verbal   Consent given by:  Patient   Risks, benefits, and alternatives were discussed: yes     Risks discussed:  Pain, infection and poor cosmetic result Universal protocol:    Procedure explained and questions answered to patient or proxy's satisfaction: yes     Imaging studies available: yes     Immediately prior to procedure, a time out was called: yes     Patient identity confirmed:  Verbally with  patient Anesthesia:    Anesthesia method:  Topical application Laceration details:    Location:  Face   Face location:  Chin   Length (cm):  1   Depth (mm):  2 Pre-procedure details:    Preparation:  Patient was prepped and draped in usual  sterile fashion and imaging obtained to evaluate for foreign bodies Exploration:    Imaging outcome: foreign body not noted     Wound exploration: wound explored through full range of motion and entire depth of wound visualized     Contaminated: no   Treatment:    Area cleansed with:  Shur-Clens, saline and chlorhexidine   Amount of cleaning:  Standard   Visualized foreign bodies/material removed: no   Skin repair:    Repair method:  Sutures   Suture size:  4-0   Suture material:  Fast-absorbing gut   Suture technique:  Simple interrupted   Number of sutures: 7 totaal between the two lacerations. also dermabond on nose. Approximation:    Approximation:  Loose Repair type:    Repair type:  Simple Post-procedure details:    Dressing:  Open (no dressing)   Procedure completion:  Tolerated     Medications Ordered in ED Medications  sodium chloride 0.9 % bolus 580 mL (has no administration in time range)  Tdap (BOOSTRIX) injection 0.5 mL (has no administration in time range)  cefTRIAXone (ROCEPHIN) 1 g in sodium chloride 0.9 % 100 mL IVPB (has no administration in time range)  azithromycin (ZITHROMAX) 500 mg in sodium chloride 0.9 % 250 mL IVPB (has no administration in time range)  lidocaine-EPINEPHrine-tetracaine (LET) topical gel (has no administration in time range)  iohexol (OMNIPAQUE) 350 MG/ML injection 50 mL (50 mLs Intravenous Contrast Given 06/10/22 0901)    ED Course/ Medical Decision Making/ A&P                           Medical Decision Making Amount and/or Complexity of Data Reviewed Radiology: ordered.  Risk Decision regarding hospitalization.    QUAY SIMKIN is a 87 y.o. male with a past medical history  significant for hypertension, dyslipidemia, CKD, GERD, CAD status post CABG, atrial flutter not on previous blood thinners, diabetes with previous DKA, and previous pneumonias who presents with fall, facial injury, and fatigue.  According to granddaughter who presents with patient, patient had a fall last night coming from the bathroom into his bedroom approximately 11 PM.  He then reportedly got back in bed but then this morning crawled out to the living room calling for help when he was feeling tired.  Family thought he may have had some more fatigue and possible left-sided weakness more than normal but is unclear when this began.  Patient does not appear to be having that on arrival.  Patient was brought in by family and initial blood pressure was found to be in the 60s with triage.  Oxygen saturations are also in the 80s and patient was tachypneic.  He was not tachycardic.  Decision made to activate patient as a level 1 trauma for evidence of fall with abnormal vital signs and hypotension.  On my evaluation, lungs have some coarseness bilaterally but chest is nontender.  Abdomen nontender.  Airway is intact and breath sounds were symmetric but coarse.  Patient has some dried blood all over his face with some blood in his naris and some nasal tenderness but otherwise did not see lacerations initially.  Will clean off the dried blood and see if there are other injuries.  He has abrasion to his right shoulder.  Patient nontender in the abdomen pelvis and was able to move all extremities with symmetric strength and sensation.  Symmetric smile.  Pupils were reactive and the right pupil is irregular  at baseline per family.  Trauma was at the bedside quickly and agreed with workup.  Will get CT head, neck, face, and chest due to hypoxia.  Portable chest and pelvis x-rays were obtained and did not see large pneumothorax on my bedside review.  Patient was continued to be hypoxic with oxygen saturations in the  mid to low 80s at 83% and was placed on nasal cannula oxygen.  Family reports she occasionally uses it at home but does not use it all the time.  Due to his hypotension, hypoxia anticipate admission after workup was completed.  We will also check for COVID and flu as family does report he has had some cough recently.  9:53 AM Workup continues to return.  Patient does have elevated lactic acid of 4.8 and leukocytosis that is new compared to prior.  Patient has evidence of AKI compared to prior. X-ray of the knee is reassuring and the CT of the face did show a nasal fracture.  The CTs of the torso do show evidence of previous bronchopneumonia however it does appear there is still some residual component.  Given the hypoxia, hypotension, tachycardia, leukocytosis, AKI, and overall appearance, we will give antibiotics and admit for further management of pneumonia.  On reassessment blood pressure has improved.  Oxygen saturations are improved on nasal cannula oxygen.  He is not tachypneic now or tachycardic.  He is resting more comfortably.  We are washing off his face to look for lacerations that may need repair but will call for admission for pneumonia.  Patient's lacerations were revealed and he has a 1 similar laceration to his right chin, 1 cm laceration to his left cheek, and an abrasion to his eyebrow.  Also a small superficial laceration to his nose.  The cheek and chin lacerations were repaired with absorbable sutures without difficulty and the nasal abrasion and laceration was repaired with Dermabond.  The abrasion to the eyebrow does not appear to need intervention at this time.  Patient will continue his plan for admission.        Final Clinical Impression(s) / ED Diagnoses Final diagnoses:  Fall, initial encounter  Closed fracture of nasal bone, initial encounter  Pneumonia due to infectious organism, unspecified laterality, unspecified part of lung  Hypoxia    Clinical  Impression: 1. Fall, initial encounter   2. Closed fracture of nasal bone, initial encounter   3. Pneumonia due to infectious organism, unspecified laterality, unspecified part of lung   4. Hypoxia     Disposition: Admit  This note was prepared with assistance of Dragon voice recognition software. Occasional wrong-word or sound-a-like substitutions may have occurred due to the inherent limitations of voice recognition software.     Tegeler, Gwenyth Allegra, MD 06/10/22 [phone]

## 2022-06-11 ENCOUNTER — Inpatient Hospital Stay (HOSPITAL_COMMUNITY): Payer: Medicare HMO

## 2022-06-11 ENCOUNTER — Other Ambulatory Visit: Payer: Self-pay

## 2022-06-11 DIAGNOSIS — J9621 Acute and chronic respiratory failure with hypoxia: Secondary | ICD-10-CM | POA: Diagnosis not present

## 2022-06-11 LAB — CBC
HCT: 33.7 % — ABNORMAL LOW (ref 39.0–52.0)
Hemoglobin: 10.9 g/dL — ABNORMAL LOW (ref 13.0–17.0)
MCH: 29 pg (ref 26.0–34.0)
MCHC: 32.3 g/dL (ref 30.0–36.0)
MCV: 89.6 fL (ref 80.0–100.0)
Platelets: 135 10*3/uL — ABNORMAL LOW (ref 150–400)
RBC: 3.76 MIL/uL — ABNORMAL LOW (ref 4.22–5.81)
RDW: 17.1 % — ABNORMAL HIGH (ref 11.5–15.5)
WBC: 12.6 10*3/uL — ABNORMAL HIGH (ref 4.0–10.5)
nRBC: 0 % (ref 0.0–0.2)

## 2022-06-11 LAB — URINALYSIS, ROUTINE W REFLEX MICROSCOPIC
Bacteria, UA: NONE SEEN
Bilirubin Urine: NEGATIVE
Glucose, UA: 50 mg/dL — AB
Ketones, ur: NEGATIVE mg/dL
Leukocytes,Ua: NEGATIVE
Nitrite: NEGATIVE
Protein, ur: 100 mg/dL — AB
Specific Gravity, Urine: 1.035 — ABNORMAL HIGH (ref 1.005–1.030)
pH: 5 (ref 5.0–8.0)

## 2022-06-11 LAB — BASIC METABOLIC PANEL
Anion gap: 10 (ref 5–15)
BUN: 35 mg/dL — ABNORMAL HIGH (ref 8–23)
CO2: 18 mmol/L — ABNORMAL LOW (ref 22–32)
Calcium: 7.9 mg/dL — ABNORMAL LOW (ref 8.9–10.3)
Chloride: 106 mmol/L (ref 98–111)
Creatinine, Ser: 1.6 mg/dL — ABNORMAL HIGH (ref 0.61–1.24)
GFR, Estimated: 42 mL/min — ABNORMAL LOW (ref >60)
Glucose, Bld: 155 mg/dL — ABNORMAL HIGH (ref 70–99)
Potassium: 4.2 mmol/L (ref 3.5–5.1)
Sodium: 134 mmol/L — ABNORMAL LOW (ref 135–145)

## 2022-06-11 LAB — CBG MONITORING, ED
Glucose-Capillary: 172 mg/dL — ABNORMAL HIGH (ref 70–99)
Glucose-Capillary: 201 mg/dL — ABNORMAL HIGH (ref 70–99)
Glucose-Capillary: 104 mg/dL — ABNORMAL HIGH (ref 70–99)

## 2022-06-11 LAB — HEMOGLOBIN A1C
Hgb A1c MFr Bld: 7.9 % — ABNORMAL HIGH (ref 4.8–5.6)
Mean Plasma Glucose: 180 mg/dL

## 2022-06-11 LAB — CK: Total CK: 718 U/L — ABNORMAL HIGH (ref 49–397)

## 2022-06-11 NOTE — ED Notes (Signed)
Pt took O2 off while resting, RN returned. Pt laying supine, medicated, denies any other needs at this time.

## 2022-06-11 NOTE — Evaluation (Signed)
Occupational Therapy Evaluation Patient Details Name: [name] MRN: [id number] DOB: 1935-06-16 Today's Date: 06/11/2022   History of Present Illness Travoris Bushey is a 87 yo male is presented after a fall with facial bleeding. Pt with complaints of L knee pain. Imaging demonstrated communicated nasal fractures, otherwise negative. PMHx: CAD, s/p CABG, COPD, (1ppd over 38 yrs), emphysema, history of pulmonary lesion and PNA, HTN (but meds stopped due to hypotension), CKD, and DM   Clinical Impression   Abe People was evaluated in the ED s/p the above admission list. Per report of pt's family (pt is extremely HOH), pt is indep in ADLs, mobility and IADLs. Upon evaluation pt was limited by [facility], L sided pain, generalized weakness, decreased balance and poor activity tolerance. Overall he required up to mod A for bed mobility and min A to stand from the stretcher. Due to L knee pain he required max A for lateral stepping. He also requires up to max A for ADLs due to the deficits listed below. OT to continue to follow. Recommend d/c home with [place], family present and confirms 24/4 direct physical assist available (and declining SNF).      Recommendations for follow up therapy are one component of a multi-disciplinary discharge planning process, led by the attending physician.  Recommendations may be updated based on patient status, additional functional criteria and insurance authorization.   Follow Up Recommendations  Home health OT     Assistance Recommended at Discharge Frequent or constant Supervision/Assistance  Patient can return home with the following A lot of help with walking and/or transfers;A lot of help with bathing/dressing/bathroom;Assistance with cooking/housework;Direct supervision/assist for medications management;Direct supervision/assist for financial management;Assist for transportation;Help with stairs or ramp for entrance    Functional Status Assessment  Patient has had a  recent decline in their functional status and demonstrates the ability to make significant improvements in function in a reasonable and predictable amount of time.  Equipment Recommendations  BSC/3in1;Wheelchair (measurements OT);Wheelchair cushion (measurements OT);Hospital bed       Precautions / Restrictions Precautions Precautions: Fall Precaution Comments: extremely HOH Restrictions Weight Bearing Restrictions: No      Mobility Bed Mobility Overal bed mobility: Needs Assistance Bed Mobility: Supine to Sit, Sit to Supine     Supine to sit: Mod assist Sit to supine: Min assist        Transfers Overall transfer level: Needs assistance Equipment used: 1 person hand held assist Transfers: Sit to/from Stand Sit to Stand: Min assist           General transfer comment: min A for sit<>stand, max A for lateral stepping due to painful LLE      Balance Overall balance assessment: Needs assistance Sitting-balance support: Feet supported Sitting balance-Leahy Scale: Fair     Standing balance support: No upper extremity supported, During functional activity Standing balance-Leahy Scale: Fair Standing balance comment: static standing                           ADL either performed or assessed with clinical judgement   ADL Overall ADL's : Needs assistance/impaired Eating/Feeding: Set up;Sitting   Grooming: Set up;Sitting   Upper Body Bathing: Set up;Sitting   Lower Body Bathing: Maximal assistance;Sit to/from stand   Upper Body Dressing : Set up;Sitting   Lower Body Dressing: Maximal assistance;Sit to/from stand   Toilet Transfer: Maximal assistance;Stand-pivot;BSC/3in1;Rolling walker (2 wheels) Toilet Transfer Details (indicate cue type and reason): difficultly putting weight through LLE  Toileting- Clothing Manipulation and Hygiene: Supervision/safety;Sitting/lateral lean       Functional mobility during ADLs: Maximal assistance General ADL  Comments: limited by L sided pain. able to stand from stretcher with min A, max A for lateral stepping     Vision Baseline Vision/History: 0 No visual deficits Vision Assessment?: No apparent visual deficits     Perception Perception Perception Tested?: No   Praxis Praxis Praxis tested?: Not tested    Pertinent Vitals/Pain Pain Assessment Pain Assessment: Faces Faces Pain Scale: Hurts whole lot Pain Location: L knee with weight bearing Pain Descriptors / Indicators: Discomfort, Grimacing, Guarding Pain Intervention(s): Limited activity within patient's tolerance, Monitored during session     Hand Dominance Right   Extremity/Trunk Assessment Upper Extremity Assessment Upper Extremity Assessment: Generalized weakness;LUE deficits/detail LUE Deficits / Details: painful with ROM, unable to assess strength LUE Sensation: WNL LUE Coordination: decreased gross motor;decreased fine motor   Lower Extremity Assessment Lower Extremity Assessment: Defer to PT evaluation   Cervical / Trunk Assessment Cervical / Trunk Assessment: Kyphotic   Communication Communication Communication: HOH   Cognition Arousal/Alertness: Awake/alert Behavior During Therapy: WFL for tasks assessed/performed Overall Cognitive Status: Difficult to assess                                 General Comments: seeminly following all commands. difficult to assess due to [place] Comments  VSS on RA, BP stable through positional changes. Granddaughter present and providing information            Home Living Family/patient expects to be discharged to:: Private residence Living Arrangements: Alone Available Help at Discharge: Family;Available 24 hours/day Type of Home: House Home Access: Stairs to enter [employer] of Steps: 2   Home Layout: One level     Bathroom Shower/Tub: Teacher, early years/pre: Standard     Home Equipment: Shower seat    Additional Comments: Pt planning to d/c to his sons house with 24/7 assist (details above)      Prior Functioning/Environment Prior Level of Function : Independent/Modified Independent;Driving             Mobility Comments: no AD, denies falls ADLs Comments: indep, family reports pt mows his own yards and completes IADLs. Family assists with heavy household chores if needed        OT Problem List: Decreased strength;Decreased range of motion;Decreased activity tolerance;Impaired balance (sitting and/or standing);Decreased safety awareness;Decreased knowledge of use of DME or AE;Decreased knowledge of precautions;Pain      OT Treatment/Interventions: Self-care/ADL training;Therapeutic exercise;DME and/or AE instruction;Therapeutic activities;Patient/family education;Balance training    OT Goals(Current goals can be found in the care plan section) Acute Rehab OT Goals Patient Stated Goal: less pain OT Goal Formulation: With patient/family Time For Goal Achievement: 06/25/22 Potential to Achieve Goals: Good ADL Goals Pt Will Perform Grooming: with min guard assist;standing Pt Will Perform Lower Body Dressing: with min assist;sit to/from stand Pt Will Transfer to Toilet: with min assist;ambulating Additional ADL Goal #1: Pt will indep complete IADL medicaiton management task Additional ADL Goal #2: Pt will demonstrate increased activity tolerance to complete at least 3 ADLs OOB with generalized supervision A  OT Frequency: Min 2X/week       AM-PAC OT "6 Clicks" Daily Activity     Outcome Measure Help from another person eating meals?: A Little Help from another person taking care of personal grooming?: A  Little Help from another person toileting, which includes using toliet, bedpan, or urinal?: A Lot Help from another person bathing (including washing, rinsing, drying)?: A Lot Help from another person to put on and taking off regular upper body clothing?: A Little Help from  another person to put on and taking off regular lower body clothing?: A Lot 6 Click Score: 15   End of Session Equipment Utilized During Treatment: Oxygen Nurse Communication: Mobility status  Activity Tolerance: Patient limited by pain Patient left: in bed;with call bell/phone within reach;with family/visitor present  OT Visit Diagnosis: Unsteadiness on feet (R26.81);Other abnormalities of gait and mobility (R26.89);Muscle weakness (generalized) (M62.81);Pain                Time: 1343-1402 OT Time Calculation (min): 19 min Charges:  OT General Charges $OT Visit: 1 Visit OT Evaluation $OT Eval Moderate Complexity: 1 Mod   Adriell Polansky D Causey 06/11/2022, 2:29 PM

## 2022-06-11 NOTE — Evaluation (Addendum)
Physical Therapy Evaluation Patient Details Name: [name] MRN: [id number] DOB: [birth year] Today's Date: 06/11/2022  History of Present Illness  Constantinos Krempasky is a 87 yo male is presented on 06/10/22 after a fall with facial bleeding. Pt with complaints of L knee pain. Imaging demonstrated communicated nasal fractures, otherwise negative. PMHx: CAD, s/p CABG, COPD, (1ppd over 75 yrs), emphysema, history of pulmonary lesion and PNA, HTN (but meds stopped due to hypotension), CKD, and DM  Clinical Impression  Pt admitted with above diagnosis. At baseline, pt is ambulatory and independent. He typically lives alone, but family plan to take him back to son's house with 24 hr care and decline SNF at d/c.  Today, pt required min-mod A for transfers and was unable to take more than a couple steps due to L knee and foot pain. Tried RW to help relieve weight on L side but still too painful.  Pt with significant decline in mobility.  Family is able to physically assist and provide 24 hr care so plan is to return home.  Do feel that pt will need w/c as he is unable to ambulate at this time - w/c could also be used to boost pt up the steps to enter home. Pt currently with functional limitations due to the deficits listed below (see PT Problem List). Pt will benefit from skilled PT to increase their independence and safety with mobility to allow discharge to the venue listed below.      Pt did require O2 during session to maintain O2 sats.  Pt was on 3 L O2 with sats 95% , tried RA but sats dropped to 85% requiring O2 replaced.     Recommendations for follow up therapy are one component of a multi-disciplinary discharge planning process, led by the attending physician.  Recommendations may be updated based on patient status, additional functional criteria and insurance authorization.  Follow Up Recommendations Home health PT (family declines SNF)      Assistance Recommended at Discharge Frequent or  constant Supervision/Assistance  Patient can return home with the following  A lot of help with walking and/or transfers;A lot of help with bathing/dressing/bathroom;Assistance with cooking/housework;Help with stairs or ramp for entrance    Equipment Recommendations Rolling walker (2 wheels);Wheelchair (measurements PT);Wheelchair cushion (measurements PT);BSC/3in1  Recommendations for Other Services       Functional Status Assessment Patient has had a recent decline in their functional status and demonstrates the ability to make significant improvements in function in a reasonable and predictable amount of time.     Precautions / Restrictions Precautions Precautions: Fall Precaution Comments: extremely HOH Restrictions Weight Bearing Restrictions: No      Mobility  Bed Mobility Overal bed mobility: Needs Assistance Bed Mobility: Supine to Sit, Sit to Supine     Supine to sit: Mod assist Sit to supine: Min assist        Transfers Overall transfer level: Needs assistance Equipment used: Rolling walker (2 wheels) Transfers: Sit to/from Stand Sit to Stand: Min assist           General transfer comment: Min A but from elevated stretcher; cues for hand placement; largely coming up on R LE due to painful L LE    Ambulation/Gait Ambulation/Gait assistance: Min assist Gait Distance (Feet): 1 Feet Assistive device: Rolling walker (2 wheels) Gait Pattern/deviations: Step-to pattern, Decreased stance time - left Gait velocity: decreased     General Gait Details: Pt only taking very small steps at EOB due to L  Knee pain , not able to take enough weight off even with RW  Stairs            Wheelchair Mobility    Modified Rankin (Stroke Patients Only)       Balance Overall balance assessment: Needs assistance Sitting-balance support: Feet supported Sitting balance-Leahy Scale: Fair     Standing balance support: Bilateral upper extremity supported Standing  balance-Leahy Scale: Poor Standing balance comment: REquired RW and min A                             Pertinent Vitals/Pain Pain Assessment Pain Assessment: Faces Faces Pain Scale: Hurts whole lot Pain Location: L knee with weight bearing Pain Descriptors / Indicators: Discomfort, Grimacing, Guarding Pain Intervention(s): Limited activity within patient's tolerance, Monitored during session    Home Living Family/patient expects to be discharged to:: Private residence Living Arrangements: Alone Available Help at Discharge: Family;Available 24 hours/day Type of Home: House Home Access: Stairs to enter Entrance Stairs-Rails: None Entrance Stairs-Number of Steps: 3   Home Layout: One level Home Equipment: Shower seat Additional Comments: Pt planning to d/c to his sons house with 24/7 assist (details above)    Prior Function Prior Level of Function : Independent/Modified Independent;Driving             Mobility Comments: no AD, denies falls other than admitting fall ADLs Comments: indep, family reports pt mows his own yards and completes IADLs. Family assists with heavy household chores if needed     Hand Dominance   Dominant Hand: Right    Extremity/Trunk Assessment   Upper Extremity Assessment Upper Extremity Assessment: Defer to OT evaluation LUE Deficits / Details: painful with ROM, unable to assess strength LUE Sensation: WNL LUE Coordination: decreased gross motor;decreased fine motor    Lower Extremity Assessment Lower Extremity Assessment: RLE deficits/detail;LLE deficits/detail RLE Deficits / Details: ROM WFL; MMT: at least 3/5 but not further tested due to pt's pain and shivering cold when OOB LLE Deficits / Details: ROM ankle and hip WFL, knee is [facility] but painful; MMT: at least 3/5 but not further tested due to pt's pain and shivering cold when OOB    Cervical / Trunk Assessment Cervical / Trunk Assessment: Kyphotic  Communication    Communication: HOH  Cognition Arousal/Alertness: Awake/alert Behavior During Therapy: WFL for tasks assessed/performed Overall Cognitive Status: Difficult to assess                                 General Comments: seeminly following all commands. difficult to assess due to [place] comments (skin integrity, edema, etc.): Grandaughter present and assisting during session.  Pt was on 3 L O2 with sats 95% , tried RA but sats dropped to 85% requiring O2 replaced.    Exercises General Exercises - Lower Extremity Ankle Circles/Pumps: AROM, Both, 5 reps, Supine Heel Slides: AAROM, Both, 5 reps, Supine Other Exercises Other Exercises: Encouraged AAROM as able to prevent further stiffness/pain   Assessment/Plan    PT Assessment Patient needs continued PT services  PT Problem List Decreased strength;Decreased knowledge of use of DME;Decreased activity tolerance;Decreased range of motion;Decreased balance;Decreased knowledge of precautions;Decreased mobility;Decreased coordination;Cardiopulmonary status limiting activity       PT Treatment Interventions DME instruction;Gait training;Stair training;Functional mobility training;Patient/family education;Wheelchair mobility training;Therapeutic activities;Therapeutic exercise;Balance training    PT  Goals (Current goals can be found in the Care Plan section)  Acute Rehab PT Goals Patient Stated Goal: decrease pain; return home with 24 hr support PT Goal Formulation: With patient/family Time For Goal Achievement: 06/25/22 Potential to Achieve Goals: Good    Frequency Min 3X/week     Co-evaluation               AM-PAC PT "6 Clicks" Mobility  Outcome Measure Help needed turning from your back to your side while in a flat bed without using bedrails?: A Little Help needed moving from lying on your back to sitting on the side of a flat bed without using bedrails?: A Lot Help needed moving to and  from a bed to a chair (including a wheelchair)?: A Lot Help needed standing up from a chair using your arms (e.g., wheelchair or bedside chair)?: A Lot Help needed to walk in hospital room?: Total Help needed climbing 3-5 steps with a railing? : Total 6 Click Score: 11    End of Session Equipment Utilized During Treatment: Gait belt Activity Tolerance: Patient limited by pain Patient left: in bed;with call bell/phone within reach;with family/visitor present Nurse Communication: Mobility status PT Visit Diagnosis: Other abnormalities of gait and mobility (R26.89);Pain Pain - Right/Left: Left Pain - part of body: Knee    Time: 0100-7121 PT Time Calculation (min) (ACUTE ONLY): 23 min   Charges:   PT Evaluation $PT Eval Low Complexity: 1 Low PT Treatments $Therapeutic Activity: 8-22 mins        Abran Richard, PT Acute Rehab [facility] [phone]   Karlton Lemon 06/11/2022, 5:08 PM

## 2022-06-11 NOTE — ED Notes (Signed)
RN called to request report information to transport patient.

## 2022-06-11 NOTE — ED Notes (Signed)
Pt in room with family. A&O x2 for baseline. Pt hard of hearing at baseline. Swelling to nose. No dressing applied at this time. Updated on plan of care.

## 2022-06-11 NOTE — ED Notes (Signed)
Patient resting in bed with son and grandaughter at bedside. Pt denies any other needs at this time.

## 2022-06-11 NOTE — Progress Notes (Addendum)
PROGRESS NOTE    [name]  SEG:[id number] DOB: 1935-01-11 DOA: 06/10/2022 PCP: Susy Frizzle, MD  Chief Complaint  Patient presents with   Fall    Brief Narrative:  [name] is [place] 87 y.o. male with medical history significant of hypertension, hyperlipidemia, CAD, atrial flutter/SVT s/p ablation, and chronic home oxygen at night, DM type II, COPD, CKD, and tobacco abuse who presents after having [name] fall.  Patient lives at home alone, but granddaughter and son live within 5 minutes.  At baseline patient ambulates without need of assistance and contact history is obtained from the patient's granddaughter at bedside as he has significant hearing loss.  Patient had reported falling sometime around 11 PM-12 AM last night..  Patient does not recall how he fell but he did hit his face.  He was able to get up and walk to the bedroom where he possibly slept before having to crawl out of his bedroom to reach the phone and call for help around 6 AM this morning.  He complains of having pain in his left shoulder and left knee.  Previous right upper lobe spiculated lung nodule concerning for primary bronchogenic carcinoma was was not ever biopsied due to patient's overall frail condition at baseline.  She reports that he still continues to smoke on Channon Brougher regular basis and has no goal of quitting.  He also is on hydrocodone due to chronic back pain.   In the emergency department patient was seen as Bronda Alfred level 1 trauma.  Patient was noted to be afebrile with respirations 14-29, blood pressure as low as 72/48 with improvement with IV fluids, O2 saturations as low as 86% with improvement on 3 L of nasal cannula oxygen.  X-rays of the chest, knee, and pelvis did not note any acute fractures.  Labs were significant for WBC 13, sodium 134, CO2 18, BUN 33, creatinine 2.05, glucose 261, and anion gap 16.  CT maxillofacial imaging was significant for communicated nasal fractures with slight depression on the right  with old plate and screw repair of left maxillary fracture.  CT imaging of the chest gave concern for possible minimal right middle and/or lower lobe pneumonia.  Right upper lobe density was noted to be unchanged in size.  Patient has been given empiric antibiotics of Rocephin, azithromycin, and Tdap booster.  Currently being treated for pneumonia.  Discharge pending further improvement, therapy recommending SNF, but family wants him to return home.  Assessment & Plan:   Principal Problem:   Acute and chronic respiratory failure with hypoxia (HCC) Active Problems:   CAP (community acquired pneumonia)   SIRS (systemic inflammatory response syndrome) (HCC)   Lactic acidosis   Transient hypotension   Acute kidney injury superimposed on chronic kidney disease ([place])   Nasal fracture   Fall at home, initial encounter   Uncontrolled type 2 diabetes mellitus with hyperglycemia, with long-term current use of insulin (HCC)   Chronic back pain   BPH (benign prostatic hyperplasia)   Pulmonary nodule  Acute on chronic respiratory failure with hypoxia secondary to suspected community-acquired pneumonia Patient was noted to be hypoxic down to 86% with improvement on 3 L of nasal cannula oxygen.   CT chest with empyhsema and pulm scarring, mild residual patchy density possible minimal RML pneumonia.  Persistent patchy density in right lower lobe with bronchial opacification, possible RLL bronchopneumonia. Perviously seen RUL density anteriorly has not enlarged. -Continuous pulse oximetry with nasal cannula oxygen maintain O2 saturation greater than 92% -  Aspiration precautions with elevation head of bed -Incentive spirometry and flutter valve -Check procalcitonin -> 4.28  -Empiric antibiotics of Rocephin and azithromycin -Mucinex -will need follow up chest imaging after improvement   SIRS lactic acidosis Acute.  Patient was noted to be tachypneic with WBC elevated at 13 and initial lactic acid 4.8.   unclear if related to the patient's recent fall with delayed presentation versus possible right-sided pneumonia. -Admit to [facility] progressive bed -Follow-up blood cultures - none was collected at admission, will hold off as he's been on abx - follow blood cultures if fevers, etc -Check procalcitonin -Continue empiric antibiotics as noted above -Trend lactic acid levels   Transient hypotension Blood pressures initially were reviewed as low as 72/48.  After initial fluid bolus patient's blood pressures were continuing. -Goal MAP greater than 65 -Adjust IV fluids as needed   Acute kidney injury superimposed on chronic kidney disease stage IIIa On admission patient creatinine elevated 2.05 with BUN 33.  Baseline creatinine previously noted to be around 1.3. -CK elevated, will repeat -Continue normal saline IV fluids at 100 mL/h -Recheck kidney function in Candace Ramus.m. - improving   Nasal fracture secondary to fall at home  Facial Trauma  Mechanical Fall Patient was noted to have Rica Heather fall at home.  Maxillofacial imaging noted communicated nasal fractures with slight depression on the right. -PT/OT to evaluate and treat -Would recommend patient to follow-up with ENT in outpatient setting -had fast absorbing gut sutures placed to facial lacs (7) and dermabond on nose -L tib/fib and foot plain films obtained - negative for acute injury   Diabetes mellitus type 2, with hyperglycemia On admission glucose elevated up to 258.  Last available hemoglobin A1c 7.5 on 07/2021.  Home medication regimen includes 70/30 insulin 12 units Azure Budnick.m. and 8 units in the evening. -Continue home medication regimen -SSI -Adjust regimen as warranted   Chronic back pain -Continue hydrocodone as needed for pain   BPH -Continue Flomax   History of right upper lobe nodule Seen on CT imaging thought to be concerning for primary bronchogenic carcinoma.  PET scan was noted to be positive but he declined bronchoscopy/biopsy in the  past.  CT here notes previously seen RUL density anteriorly has not enlarged and areas that were solid appearing previously show some aeration, suggests this was benign bronchial dz and pulm scarring.  Rads recommends continued follow up (though not sure this would be desired based on his desire not to pursue procedure in past? - would review).   Tobacco abuse Patient continues to smoke on Kash Davie regular basis   Hyperlipidemia -Continue pravastatin   GERD -Continue Protonix    DVT prophylaxis: lovenox Code Status: DNR Family Communication: granddaughter at bedside Disposition:   Status is: Inpatient Remains inpatient appropriate because: none   Consultants:  surgery  Procedures:  none  Antimicrobials:  Anti-infectives (From admission, onward)    Start     Dose/Rate Route Frequency Ordered Stop   06/11/22 1000  cefTRIAXone (ROCEPHIN) 2 g in sodium chloride 0.9 % 100 mL IVPB        2 g 200 mL/hr over 30 Minutes Intravenous Every 24 hours 06/10/22 1409     06/11/22 1000  azithromycin (ZITHROMAX) 500 mg in sodium chloride 0.9 % 250 mL IVPB        500 mg 250 mL/hr over 60 Minutes Intravenous Every 24 hours 06/10/22 1424     06/10/22 1415  cefTRIAXone (ROCEPHIN) 1 g in sodium chloride 0.9 % 100 mL  IVPB        1 g 200 mL/hr over 30 Minutes Intravenous  Once 06/10/22 1408 06/10/22 1816   06/10/22 1000  cefTRIAXone (ROCEPHIN) 1 g in sodium chloride 0.9 % 100 mL IVPB        1 g 200 mL/hr over 30 Minutes Intravenous  Once 06/10/22 0956 06/10/22 1139   06/10/22 1000  azithromycin (ZITHROMAX) 500 mg in sodium chloride 0.9 % 250 mL IVPB        500 mg 250 mL/hr over 60 Minutes Intravenous  Once 06/10/22 0956 06/10/22 1354       Subjective: Hard of hearing, c/o L knee and foot pain  Objective: Vitals:   06/11/22 1113 06/11/22 1400 06/11/22 1530 06/11/22 1932  BP:  (!) 135/54 (!) 117/54 (!) 127/57  Pulse:  82 75 84  Resp:  (!) 22 19 (!) 22  Temp: 98.9 F (37.2 C)  99 F (37.2  C) 98.3 F (36.8 C)  TempSrc:   Oral Oral  SpO2:  95% 100% 94%  Weight:      Height:       No intake or output data in the 24 hours ending 06/11/22 2050 Filed Weights   06/10/22 0817 06/10/22 0911  Weight: 58 kg (S) 52.2 kg    Examination:  General exam: Appears calm and comfortable  Facial trauma from fall  Respiratory system: unlabored, on 2 L [place] Cardiovascular system: RRR Gastrointestinal system: Abdomen is nondistended, soft and nontender. Central nervous system: extremely hard of hearing, moving all extremities Extremities: mild TTP of L knee    Data Reviewed: I have personally reviewed following labs and imaging studies  CBC: Recent Labs  Lab 06/10/22 0835 06/10/22 0843 06/11/22 0420  WBC 13.0*  --  12.6*  HGB 13.1 13.3 10.9*  HCT 39.9 39.0 33.7*  MCV 89.7  --  89.6  PLT 151  --  135*    Basic Metabolic Panel: Recent Labs  Lab 06/10/22 0835 06/10/22 0843 06/11/22 0420  NA 134* 136 134*  K 4.6 4.7 4.2  CL 100 103 106  CO2 18*  --  18*  GLUCOSE 261* 258* 155*  BUN 33* 36* 35*  CREATININE 2.05* 2.00* 1.60*  CALCIUM 8.8*  --  7.9*    GFR: Estimated Creatinine Clearance: 24.9 mL/min (Reilley Valentine) (by C-G formula based on SCr of 1.6 mg/dL (H)).  Liver Function Tests: Recent Labs  Lab 06/10/22 0835  AST 36  ALT 17  ALKPHOS 52  BILITOT 0.9  PROT 8.1  ALBUMIN 3.2*    CBG: Recent Labs  Lab 06/10/22 1811 06/10/22 2125 06/11/22 0731 06/11/22 1251 06/11/22 1657  GLUCAP 303* 273* 172* 201* 104*     Recent Results (from the past 240 hour(s))  Resp panel by RT-PCR (RSV, Flu Claressa Hughley&B, Covid) Anterior Nasal Swab     Status: None   Collection Time: 06/10/22  8:43 AM   Specimen: Anterior Nasal Swab  Result Value Ref Range Status   SARS Coronavirus 2 by RT PCR NEGATIVE NEGATIVE Final    Comment: (NOTE) SARS-CoV-2 target nucleic acids are NOT DETECTED.  The SARS-CoV-2 RNA is generally detectable in upper respiratory specimens during the acute phase of  infection. The lowest concentration of SARS-CoV-2 viral copies this assay can detect is 138 copies/mL. Omunique Pederson negative result does not preclude SARS-Cov-2 infection and should not be used as the sole basis for treatment or other patient management decisions. Elijiah Mickley negative result may occur with  improper specimen collection/handling, submission of  specimen other than nasopharyngeal swab, presence of viral mutation(s) within the areas targeted by this assay, and inadequate number of viral copies(<138 copies/mL). Danie Diehl negative result must be combined with clinical observations, patient history, and epidemiological information. The expected result is Negative.  Fact Sheet for Patients:  EntrepreneurPulse.com.au  Fact Sheet for Healthcare Providers:  IncredibleEmployment.be  This test is no t yet approved or cleared by the [place] FDA and  has been authorized for detection and/or diagnosis of SARS-CoV-2 by FDA under an Emergency Use Authorization (EUA). This EUA will remain  in effect (meaning this test can be used) for the duration of the COVID-19 declaration under Section 564(b)(1) of the Act, 21 U.S.C.section 360bbb-3(b)(1), unless the authorization is terminated  or revoked sooner.       Influenza Joseandres Mazer by PCR NEGATIVE NEGATIVE Final   Influenza B by PCR NEGATIVE NEGATIVE Final    Comment: (NOTE) The Xpert Xpress SARS-CoV-2/FLU/RSV plus assay is intended as an aid in the diagnosis of influenza from Nasopharyngeal swab specimens and should not be used as Fleetwood Pierron sole basis for treatment. Nasal washings and aspirates are unacceptable for Xpert Xpress SARS-CoV-2/FLU/RSV testing.  Fact Sheet for Patients: EntrepreneurPulse.com.au  Fact Sheet for Healthcare Providers: IncredibleEmployment.be  This test is not yet approved or cleared by the [place] FDA and has been authorized for detection and/or diagnosis of SARS-CoV-2  by FDA under an Emergency Use Authorization (EUA). This EUA will remain in effect (meaning this test can be used) for the duration of the COVID-19 declaration under Section 564(b)(1) of the Act, 21 U.S.C. section 360bbb-3(b)(1), unless the authorization is terminated or revoked.     Resp Syncytial Virus by PCR NEGATIVE NEGATIVE Final    Comment: (NOTE) Fact Sheet for Patients: EntrepreneurPulse.com.au  Fact Sheet for Healthcare Providers: IncredibleEmployment.be  This test is not yet approved or cleared by the [place] FDA and has been authorized for detection and/or diagnosis of SARS-CoV-2 by FDA under an Emergency Use Authorization (EUA). This EUA will remain in effect (meaning this test can be used) for the duration of the COVID-19 declaration under Section 564(b)(1) of the Act, 21 U.S.C. section 360bbb-3(b)(1), unless the authorization is terminated or revoked.  Performed at [facility], [place] [address], [place], [place] [address]          Radiology Studies: DG Foot 2 Views Left  Result Date: 06/11/2022 CLINICAL DATA:  Pain, recent fall EXAM: LEFT FOOT - 2 VIEW COMPARISON:  None Available. FINDINGS: No displaced fracture or dislocation is seen. There is mild hallux valgus deformity. IMPRESSION: No recent fracture or dislocation is seen in left foot. Electronically Signed   By: Elmer Picker M.D.   On: 06/11/2022 13:05   DG Tibia/Fibula Left  Result Date: 06/11/2022 CLINICAL DATA:  Left lower leg pain, fall EXAM: LEFT TIBIA AND FIBULA - 2 VIEW COMPARISON:  None Available. FINDINGS: No acute bony abnormality. Specifically, no fracture, subluxation, or dislocation. Joint spaces maintained. Soft tissues are intact. Vascular calcifications noted. IMPRESSION: No acute bony abnormality. Electronically Signed   By: Rolm Baptise M.D.   On: 06/11/2022 13:03   DG Knee Left Port  Result Date: 06/10/2022 CLINICAL DATA:  Fall.   The swelling and bruising. EXAM: PORTABLE LEFT KNEE - 1-2 VIEW COMPARISON:  10/04/2016 FINDINGS: There is MEDIAL and patellofemoral compartment narrowing. No acute fracture or subluxation. No joint effusion. There is dense atherosclerotic calcification of the popliteal artery. IMPRESSION: Mild degenerative changes. No evidence for acute abnormality. Electronically  Signed   By: Nolon Nations M.D.   On: 06/10/2022 09:31   CT CERVICAL SPINE WO CONTRAST  Result Date: 06/10/2022 CLINICAL DATA:  Fall with trauma to the head and neck. EXAM: CT CERVICAL SPINE WITHOUT CONTRAST TECHNIQUE: Multidetector CT imaging of the cervical spine was performed without intravenous contrast. Multiplanar CT image reconstructions were also generated. RADIATION DOSE REDUCTION: This exam was performed according to the departmental dose-optimization program which includes automated exposure control, adjustment of the mA and/or kV according to patient size and/or use of iterative reconstruction technique. COMPARISON:  None Available. FINDINGS: Alignment: Mild rotation of C1 on C2 which is favored to be positional. If the patient can turn the head from right to left this would rule out rotatory subluxation, which is not favored. Skull base and vertebrae: No evidence of fracture or focal lesion. Soft tissues and spinal canal: Vascular calcification. No traumatic soft tissue finding. Disc levels: Mild degenerative spondylosis with endplate osteophytes at C4-5 and C6-7. Facet osteoarthritis worse on the right than the left. No apparent canal stenosis. Bony foraminal narrowing on the right at C3-4, C4-5 and C5-6. Upper chest: Negative acutely.  See results of chest CT. Other: None IMPRESSION: No acute or traumatic finding. Mild rotation of C1 on C2 which is favored to be positional. If the patient can turn the head from right to left, this would rule out rotatory subluxation, which is not favored. Electronically Signed   By: Nelson Chimes M.D.    On: 06/10/2022 09:29   CT MAXILLOFACIAL WO CONTRAST  Result Date: 06/10/2022 CLINICAL DATA:  Fall with trauma to the face. EXAM: CT MAXILLOFACIAL WITHOUT CONTRAST TECHNIQUE: Multidetector CT imaging of the maxillofacial structures was performed. Multiplanar CT image reconstructions were also generated. RADIATION DOSE REDUCTION: This exam was performed according to the departmental dose-optimization program which includes automated exposure control, adjustment of the mA and/or kV according to patient size and/or use of iterative reconstruction technique. COMPARISON:  None Available. FINDINGS: Osseous: Comminuted nasal fractures with slight depression on the right. Old plate and screw repair of Xane Amsden left maxillary fracture. No other acute facial finding. Nasal septum bows towards the left but no septal fracture is established. Orbits: No orbital injury. Sinuses: No inflammatory or traumatic fluid present within the sinuses. Soft tissues: No other soft tissue injury evident. Limited intracranial: Negative IMPRESSION: 1. Comminuted nasal fractures with slight depression on the right. 2. Old plate and screw repair of Lileigh Fahringer left maxillary fracture. Electronically Signed   By: Nelson Chimes M.D.   On: 06/10/2022 09:28   CT HEAD WO CONTRAST (5MM)  Result Date: 06/10/2022 CLINICAL DATA:  Fall with trauma to the head and face. Anticoagulated. EXAM: CT HEAD WITHOUT CONTRAST TECHNIQUE: Contiguous axial images were obtained from the base of the skull through the vertex without intravenous contrast. RADIATION DOSE REDUCTION: This exam was performed according to the departmental dose-optimization program which includes automated exposure control, adjustment of the mA and/or kV according to patient size and/or use of iterative reconstruction technique. COMPARISON:  None Available. FINDINGS: Brain: Mild age related volume loss. No evidence of old or acute infarction, mass lesion, hemorrhage, hydrocephalus or extra-axial  collection. Mild chronic small-vessel ischemic change of the cerebral hemispheric white matter, thalami and basal ganglia. Few scattered punctate benign calcifications are nonspecific and not likely significant presently. Vascular: There is atherosclerotic calcification of the major vessels at the base of the brain. Skull: Negative Sinuses/Orbits: Clear/normal Other: None IMPRESSION: No acute or traumatic finding. Mild age related  volume loss. Mild chronic small-vessel ischemic change of the white matter, thalami and basal ganglia. Electronically Signed   By: Nelson Chimes M.D.   On: 06/10/2022 09:16   CT Chest W Contrast  Result Date: 06/10/2022 CLINICAL DATA:  Blunt trauma to the chest after Berna Gitto fall. EXAM: CT CHEST WITH CONTRAST TECHNIQUE: Multidetector CT imaging of the chest was performed during intravenous contrast administration. RADIATION DOSE REDUCTION: This exam was performed according to the departmental dose-optimization program which includes automated exposure control, adjustment of the mA and/or kV according to patient size and/or use of iterative reconstruction technique. CONTRAST:  28mL OMNIPAQUE IOHEXOL 350 MG/ML SOLN COMPARISON:  10/03/2021 FINDINGS: Cardiovascular: Heart size is normal. Previous median sternotomy and CABG. Aortic atherosclerosis without aneurysm or dissection. No pulmonary emboli. Mediastinum/Nodes: No mediastinal or hilar mass or lymphadenopathy. Small mediastinal nodes are stable. Lungs/Pleura: Background emphysema and pulmonary scarring. Naina Sleeper previously seen right upper lobe density anteriorly has not enlarged since Dulcinea Kinser parole and areas that were solid-appearing previously now show some aeration. These findings suggest that this is benign bronchial disease and pulmonary scarring. Overall maximal dimension remains approximately 1.5 cm. Additional continued follow-up would be suggested. Elsewhere, previously seen right middle lobe collapse has resolved, with mild residual patchy  density that could be scarring or minimal right middle lobe pneumonia presently. Fairly extensive patchy bronchopneumonia previously seen in the right lower lobe has improved. There is some persistent patchy density in that region with bronchial opacification that could be due to scarring, residual or recurrent right lower lobe bronchopneumonia. Follow-up to clearing would be recommended. No pleural effusion on either side. Upper Abdomen: No abdominal finding. Musculoskeletal: No spinal or rib fracture. IMPRESSION: 1. No acute traumatic finding. 2. Previous median sternotomy and CABG. Aortic atherosclerosis without aneurysm or dissection. 3. Background emphysema and pulmonary scarring. 4. Previously seen right middle lobe collapse has resolved, with mild residual patchy density that could be scarring or minimal right middle lobe pneumonia presently. 5. Fairly extensive patchy bronchopneumonia previously seen in the right lower lobe has improved. There is some persistent patchy density in that region with bronchial opacification that could be due to scarring, residual or recurrent right lower lobe bronchopneumonia. Follow-up to clearing would be recommended. 6. Amarii Amy previously seen right upper lobe density anteriorly has not enlarged and areas that were solid-appearing previously show some aeration. These findings suggest that this is benign bronchial disease and pulmonary scarring. Overall maximal dimension remains approximately 1.5 cm. Additional/continued follow-up would be suggested. Aortic Atherosclerosis (ICD10-I70.0) and Emphysema (ICD10-J43.9). Electronically Signed   By: Nelson Chimes M.D.   On: 06/10/2022 09:15   DG Pelvis Portable  Result Date: 06/10/2022 CLINICAL DATA:  87 year old male status post fall. EXAM: PORTABLE PELVIS 1-2 VIEWS COMPARISON:  CT Abdomen and Pelvis 11/05/2017. FINDINGS: Portable AP semi upright view at 0835 hours. Femoral heads remain normally located. Left lateral iliac wing not  completely included, but visible pelvis appears stable and intact. Grossly intact proximal femurs. Bulky iliofemoral calcified atherosclerosis. Negative visible bowel gas. IMPRESSION: 1. No acute fracture or dislocation identified about the pelvis. 2. Calcified atherosclerosis. Electronically Signed   By: Genevie Ann M.D.   On: 06/10/2022 08:50   DG CHEST PORT 1 VIEW  Result Date: 06/10/2022 CLINICAL DATA:  Fall EXAM: PORTABLE CHEST 1 VIEW COMPARISON:  02/21/2022 IMPRESSION: Hyperinflated lungs with pulmonary vascular congestion. Electronically Signed   By: Sammie Bench M.D.   On: 06/10/2022 08:49        Scheduled Meds:  albuterol  2.5 mg Nebulization Q6H   aspirin  325 mg Oral Daily   enoxaparin (LOVENOX) injection  30 mg Subcutaneous Q24H   guaiFENesin  600 mg Oral BID   insulin aspart  0-9 Units Subcutaneous TID WC   insulin aspart protamine- aspart  12 Units Subcutaneous Q breakfast   insulin aspart protamine- aspart  8 Units Subcutaneous Q supper   pantoprazole  40 mg Oral Daily   pravastatin  40 mg Oral QHS   sodium chloride flush  3 mL Intravenous Q12H   tamsulosin  0.4 mg Oral Daily   Continuous Infusions:  sodium chloride Stopped (06/11/22 1144)   azithromycin Stopped (06/11/22 1144)   cefTRIAXone (ROCEPHIN)  IV Stopped (06/11/22 1144)     LOS: 1 day    Time spent: over 30 min    Fayrene Helper, MD [facility]   To contact the attending provider between 7A-7P or the covering provider during after hours 7P-7A, please log into the web site [email or web address] and access using universal [place] password for that web site. If you do not have the password, please call the hospital operator.  06/11/2022, 8:50 PM

## 2022-06-11 NOTE — ED Notes (Signed)
Pt resting in room NAD chest rising and falling. Updated on plan of care.

## 2022-06-12 DIAGNOSIS — J9621 Acute and chronic respiratory failure with hypoxia: Secondary | ICD-10-CM | POA: Diagnosis not present

## 2022-06-12 LAB — GLUCOSE, CAPILLARY
Glucose-Capillary: 146 mg/dL — ABNORMAL HIGH (ref 70–99)
Glucose-Capillary: 221 mg/dL — ABNORMAL HIGH (ref 70–99)
Glucose-Capillary: 225 mg/dL — ABNORMAL HIGH (ref 70–99)
Glucose-Capillary: 250 mg/dL — ABNORMAL HIGH (ref 70–99)
Glucose-Capillary: 78 mg/dL (ref 70–99)
Glucose-Capillary: 98 mg/dL (ref 70–99)

## 2022-06-12 MED ORDER — ORAL CARE MOUTH RINSE: 15.0000 mL | OROMUCOSAL | Status: DC | PRN

## 2022-06-12 MED ORDER — ADULT MULTIVITAMIN W/MINERALS CH
1.0000 | ORAL_TABLET | Freq: Every day | ORAL | Status: DC
  Administered 2022-06-12 – 2022-06-14 (×3): 1 via ORAL
  Filled 2022-06-12 (×3): qty 1

## 2022-06-12 MED ORDER — GUAIFENESIN 100 MG/5ML PO LIQD
10.0000 mL | Freq: Four times a day (QID) | ORAL | Status: DC
  Administered 2022-06-12 – 2022-06-14 (×9): 10 mL via ORAL
  Filled 2022-06-12 (×9): qty 10

## 2022-06-12 MED ORDER — ENSURE ENLIVE PO LIQD
237.0000 mL | Freq: Two times a day (BID) | ORAL | Status: DC
  Administered 2022-06-12 – 2022-06-14 (×6): 237 mL via ORAL

## 2022-06-12 MED ORDER — ALBUTEROL SULFATE (2.5 MG/3ML) 0.083% IN NEBU
2.5000 mg | INHALATION_SOLUTION | Freq: Two times a day (BID) | RESPIRATORY_TRACT | Status: DC
  Administered 2022-06-12 – 2022-06-14 (×4): 2.5 mg via RESPIRATORY_TRACT
  Filled 2022-06-12 (×5): qty 3

## 2022-06-12 MED ORDER — ORAL CARE MOUTH RINSE
15.0000 mL | OROMUCOSAL | Status: DC
  Administered 2022-06-12 – 2022-06-14 (×9): 15 mL via OROMUCOSAL

## 2022-06-12 MED ORDER — AZITHROMYCIN 500 MG PO TABS
500.0000 mg | ORAL_TABLET | Freq: Every day | ORAL | Status: AC
  Administered 2022-06-13 – 2022-06-14 (×2): 500 mg via ORAL
  Filled 2022-06-12 (×2): qty 1

## 2022-06-12 NOTE — Progress Notes (Signed)
PHARMACIST - PHYSICIAN COMMUNICATION DR:   [place] CONCERNING: Antibiotic IV to Oral Route Change Policy  RECOMMENDATION: This patient is receiving azithromycin by the intravenous route.  Based on criteria approved by the Pharmacy and Therapeutics Committee, the antibiotic(s) is/are being converted to the equivalent oral dose form(s).   DESCRIPTION: These criteria include: Patient being treated for a respiratory tract infection, urinary tract infection, cellulitis or clostridium difficile associated diarrhea if on metronidazole The patient is not neutropenic and does not exhibit a GI malabsorption state The patient is eating (either orally or via tube) and/or has been taking other orally administered medications for a least 24 hours The patient is improving clinically and has a Tmax < 100.5  If you have questions about this conversion, please contact the Pharmacy Department  []   [phone] )  [name] [x]   [phone] )  [name]  []   [phone] )  [facility] []   [phone] )  [place], PharmD, BCPS 06/12/2022 3:25 PM

## 2022-06-12 NOTE — Discharge Instructions (Signed)
[place] Hospital Stay Proper nutrition can help your body recover from illness and injury.   Foods and beverages high in protein, vitamins, and minerals help rebuild muscle loss, promote healing, & reduce fall risk.   In addition to eating healthy foods, a nutrition shake is an easy, delicious way to get the nutrition you need during and after your hospital stay  It is recommended that you continue to drink 2 bottles per day of:   Ensure Plus or Ensure Complete for at least 1 month (30 days) after your hospital stay   Tips for adding a nutrition shake into your routine: As allowed, drink one with vitamins or medications instead of water or juice Enjoy one as a tasty mid-morning or afternoon snack Drink cold or make a milkshake out of it Drink one instead of milk with cereal or snacks Use as a coffee creamer   Available at the following grocery stores and pharmacies:           * [facility] [phone]            For COUPONS visit: www.ensure.com/join or [email or web address]-may.com/   Suggested Substitutions Ensure Plus = Boost Plus = Carnation Breakfast Essentials = Boost Compact

## 2022-06-12 NOTE — Progress Notes (Signed)
SLP Cancellation Note  Patient Details Name: ANITA MCADORY MRN: [id number] DOB: 1935/01/07   Cancelled treatment:       Reason Eval/Treat Not Completed: SLP screened, no needs identified, will sign off. SLP spoke with patient and granddaughter in room. Granddaughter reported that she has not noticed any changes in his swallow function but that he will occasionally have a pill that gets lodged in his esophagus. (She recalled a time about 6 months ago when he had a Mucinex tablet get stuck). She also reports that patient does not care to follow any recommendations or implement any changes in his daily routine/habits. No further skilled intervention warranted at this time.  Sonia Baller, MA, CCC-SLP Speech Therapy

## 2022-06-12 NOTE — TOC Initial Note (Signed)
Transition of Care [facility]) - Initial/Assessment Note    Patient Details  Name: [name] MRN: [id number] Date of Birth: [birth year]  Transition of [facility]) CM/SW Contact:    Cyndi Bender, RN Phone Number: 06/12/2022, 1:01 PM  Clinical Narrative:                 [name] to granddaughter, [name], regarding transition needs.  Patient will be discharging to son's home. [address], [place], [place] [address] Patient has used [employer] in the past and [name] would like to use them again. Malachy Mood with [facility] accepted referral.  [name] is agreeable to use in house provider [employer] for DME needs. Notified Erasmo Downer with [employer] of wheelchair and portable 02 tank needs. [name] will transport home.  Address, Phone number and PCP verified.   Need Home health PT, OT orders Planned Disposition: Home with Health Care Svc Barriers to Discharge: Continued Medical Work up   Patient Goals and CMS Choice Patient states their goals for this hospitalization and ongoing recovery are:: move in with son and granddaughter   Choice offered to / list presented to : Adult Children (granddaughter) [place] ownership interest in [facility].provided to:: Adult Children    Expected Discharge Plan and Services Planned Disposition: Home with Health Care Svc   Discharge Planning Services: CM Consult Post Acute Care Choice: Home Health, Durable Medical Equipment Living arrangements for the past 2 months: Single Family Home                 DME Arranged: Wheelchair manual DME Agency: [employer] Date DME Agency Contacted: 06/12/22 Time DME Agency Contacted: 16 Representative spoke with at DME Agency: Erasmo Downer HH Arranged: PT, OT [place] Agency: [place] Date [place]: 06/12/22 Time [place]: 53 Representative spoke with at [place]: [place] Arrangements/Services Living arrangements for the past 2 months: [place] Lives with:: Adult Children Patient language and need for interpreter reviewed:: Yes        Need for Family Participation in Patient Care: Yes (Comment) Care giver support system in place?: Yes (comment) Current home services: DME (02) Criminal Activity/Legal Involvement Pertinent to Current Situation/Hospitalization: No - Comment as needed  Activities of Daily Living Home Assistive Devices/Equipment: Dentures (specify type), Eyeglasses, Oxygen ADL Screening (condition at time of admission) Patient's cognitive ability adequate to safely complete daily activities?: Yes Is the patient deaf or have difficulty hearing?: Yes Does the patient have difficulty seeing, even when wearing glasses/contacts?: No Does the patient have difficulty concentrating, remembering, or making decisions?: No Patient able to express need for assistance with ADLs?: Yes Does the patient have difficulty dressing or bathing?: No Independently performs ADLs?: Yes (appropriate for developmental age) (until fall yes - now unsure) Does the patient have difficulty walking or climbing stairs?: Yes Weakness of Legs: None Weakness of Arms/Hands: None  Permission Sought/Granted Permission sought to share information with : Investment banker, corporate granted to share info w AGENCY: HH        Emotional Assessment         Alcohol / Substance Use: Not Applicable Psych Involvement: No (comment)  Admission diagnosis:  Hypoxia [R09.02] Acute respiratory failure with hypoxia (HCC) [J96.01] Closed fracture of nasal bone, initial encounter [S02.2XXA] Fall, initial encounter [id number]] Pneumonia due to infectious organism, unspecified laterality, unspecified part of lung [J18.9] Patient Active Problem List   Diagnosis Date Noted   SIRS (  systemic inflammatory response syndrome) (HCC) 06/10/2022   Lactic acidosis 06/10/2022   Transient hypotension 06/10/2022   Acute kidney injury  superimposed on chronic kidney disease ([place]) 06/10/2022   Nasal fracture 06/10/2022   Fall at home, initial encounter 06/10/2022   Chronic back pain 06/10/2022   BPH (benign prostatic hyperplasia) 06/10/2022   Protein-calorie malnutrition, severe 10/05/2021   Acute and chronic respiratory failure with hypoxia ([place]) 10/03/2021   CAP (community acquired pneumonia) 10/03/2021   Pulmonary nodule 10/03/2021   CAD (coronary artery disease) 10/03/2021   Aspiration pneumonia ([place]) 10/03/2021   Chronic kidney disease (CKD), stage III (moderate) ([place]) 10/03/2021   Uncontrolled type 2 diabetes mellitus with hyperglycemia, with long-term current use of insulin ([place]) 10/03/2021   Nocturnal hypoxemia 09/05/2020   COPD GOLD 0 still smoking  06/07/2018   Pseudomonas pneumonia ([place]) 02/02/2018   Adult BMI <19 kg/sq m 02/02/2018   Pulmonary infiltrates on CXR 01/08/2018   Depression 11/25/2017   DKA (diabetic ketoacidosis) ([place]) 07/29/2017   Lung nodule 01/13/2015   Food impaction of esophagus 12/06/2013   Dysphagia 12/06/2013   Cerebrovascular disease 11/14/2011   Fatigue 01/08/2011   Bruit 01/08/2011   Cigarette smoker 01/11/2010   ABDOMINAL BRUIT 01/11/2010   DYSLIPIDEMIA 11/19/2008   Essential hypertension 11/19/2008   Atrial flutter ([place]) 11/19/2008   DVT 11/19/2008   PCP:  Susy Frizzle, MD Pharmacy:   [id number] - [place], [place] - [address] [place] [address] Phone: [phone] Fax: [phone]     Social Determinants of Health (SDOH) Social History: SDOH Screenings   Food Insecurity: No Food Insecurity (06/11/2022)  Housing: Low Risk  (06/11/2022)  Transportation Needs: No Transportation Needs (06/11/2022)  Utilities: Not At Risk (06/11/2022)  Alcohol Screen: Low Risk  (08/31/2021)  Depression (PHQ2-9): Low Risk  (02/21/2022)  Financial Resource Strain: Low Risk  (08/31/2021)  Physical Activity:  Insufficiently Active (08/31/2021)  Social Connections: Moderately Isolated (08/31/2021)  Stress: No Stress Concern Present (08/31/2021)  Tobacco Use: High Risk (06/10/2022)   SDOH Interventions:     Readmission Risk Interventions    10/08/2021   12:04 PM  Readmission Risk Prevention Plan  Post Dischage Appt Complete  Medication Screening Complete  Transportation Screening Complete

## 2022-06-12 NOTE — Progress Notes (Signed)
Initial Nutrition Assessment  DOCUMENTATION CODES:   Severe malnutrition in context of chronic illness, Underweight  INTERVENTION:   Liberalize pt diet to regular due to malnutrition Multivitamin w/ minerals daily Ensure Enlive po BID, each supplement provides 350 kcal and 20 grams of protein.  NUTRITION DIAGNOSIS:   Severe Malnutrition related to chronic illness as evidenced by severe muscle depletion, severe fat depletion.  GOAL:   Patient will meet greater than or equal to 90% of their needs  MONITOR:   PO intake, Supplement acceptance, Labs, Skin  REASON FOR ASSESSMENT:   Consult Assessment of nutrition requirement/status, Poor PO  ASSESSMENT:   87 y.o. male presented to the ED after a fall at home. PMH includes CAD, HLD, HTN, T2DM, COPD, and CKD III. Pt admitted with acute on chronic respiratory failure secondary to CAP, SIRS, nasal fracture, and AKI on CKD.   Pt laying in bed, granddaughter at bedside. Granddaughter provided majority of history due to pt hard of hearing. Reports that pt was independent at home prior to admission. Would eat out for breakfast and dinner majority of days. Would typically drink an Ensure Max for lunch. Discussed with granddaughter about switching to Ensure Plus or Complete for his lunch shake since it is more complete. Denies any recent weight loss, states it has been stable between from 120-130 for a few years.   Medications reviewed and include: Zithromax, NovoLog SSI, NovoLog 70/30, MVI, Protonix, IV antibiotics  Labs reviewed: Sodium 134, BUN 35, Creatinine 1.60, Hgb A1c 7.9%, 24 hr CBGs 98-225  NUTRITION - FOCUSED PHYSICAL EXAM:  Flowsheet Row Most Recent Value  Orbital Region Severe depletion  Upper Arm Region Severe depletion  Thoracic and Lumbar Region Severe depletion  Buccal Region Severe depletion  Temple Region Severe depletion  Clavicle Bone Region Severe depletion  Clavicle and Acromion Bone Region Severe depletion   Scapular Bone Region Severe depletion  Dorsal Hand Severe depletion  Patellar Region Severe depletion  Anterior Thigh Region Severe depletion  Posterior Calf Region Severe depletion  Edema (RD Assessment) None  Hair Reviewed  Eyes Reviewed  Mouth Reviewed  Skin Reviewed  Nails Reviewed   Diet Order:   Diet Order             Diet regular Room service appropriate? Yes with Assist; Fluid consistency: Thin  Diet effective now                   EDUCATION NEEDS:   Education needs have been addressed  Skin:  Skin Assessment: Skin Integrity Issues: Skin Integrity Issues:: Stage II Stage II: sacrum  Last BM:  12/18  Height:   Ht Readings from Last 1 Encounters:  06/10/22 (S) 6\' 2"  (1.88 m)    Weight:   Wt Readings from Last 1 Encounters:  06/11/22 58.3 kg   Ideal Body Weight:  86.4 kg  BMI:  Body mass index is 16.5 kg/m.  Estimated Nutritional Needs:  Kcal:  1800-2000 Protein:  90-110 grams Fluid:  >/= 1.8 L    Hermina Barters RD, LDN Clinical Dietitian See [facility] for contact information.

## 2022-06-12 NOTE — Progress Notes (Signed)
SATURATION QUALIFICATIONS: (This note is used to comply with regulatory documentation for home oxygen)  Patient Saturations on Room Air at Rest = 87%  Patient Saturations on Room Air while Ambulating = 85% Unable to ambulate patient d/t weakness. Saturation is s/p bed mobility  Patient Saturations on 2 Liters of oxygen while Ambulating = 88% Again, patient unable to ambulate, saturation is taken during bed mobility.  Please briefly explain why patient needs home oxygen: Patient required 3L oxygen for oxygen saturation to increase back to 90%

## 2022-06-12 NOTE — Progress Notes (Signed)
Spoke with lab regarding venous blood gas results. Blood sample collected around 1930, results still processing. Per lab, sample needs to be recollected due to huge bubble in sample. New STAT order placed.

## 2022-06-12 NOTE — Progress Notes (Signed)
  Transition of Care [facility]) Screening Note   Patient Details  Name: [name] Date of Birth: 12-13-1934   Transition of Care [facility]) CM/SW Contact:    Cyndi Bender, RN Phone Number: 06/12/2022, 9:19 AM    Transition of Care Department [facility]) has reviewed patient and no TOC needs have been identified at this time. We will continue to monitor patient advancement through interdisciplinary progression rounds. If new patient transition needs arise, please place a TOC consult.

## 2022-06-12 NOTE — Progress Notes (Signed)
PROGRESS NOTE    [name]  ZRA:[id number] DOB: [birth year] DOA: 06/10/2022 PCP: Susy Frizzle, MD    Brief Narrative:   [name] is a 87 y.o. male with past medical history significant for hypertension, hyperlipidemia, CAD, atrial flutter/SVT s/p ablation, and chronic home oxygen at night, DM type II, COPD, CKD, and tobacco abuse who presents after having a fall.  Patient lives at home alone, but granddaughter and son live within 5 minutes.  At baseline patient ambulates without need of assistance and contact history is obtained from the patient's granddaughter at bedside as he has significant hearing loss.  Patient had reported falling sometime around 11 PM-12 AM last night..  Patient does not recall how he fell but he did hit his face.  He was able to get up and walk to the bedroom where he possibly slept before having to crawl out of his bedroom to reach the phone and call for help around 6 AM this morning.  He complains of having pain in his left shoulder and left knee.  Previous right upper lobe spiculated lung nodule concerning for primary bronchogenic carcinoma was was not ever biopsied due to patient's overall frail condition at baseline.  She reports that he still continues to smoke on a regular basis and has no goal of quitting.  He also is on hydrocodone due to chronic back pain.   In the emergency department patient was seen as a level 1 trauma.  Patient was noted to be afebrile with respirations 14-29, blood pressure as low as 72/48 with improvement with IV fluids, O2 saturations as low as 86% with improvement on 3 L of nasal cannula oxygen.  X-rays of the chest, knee, and pelvis did not note any acute fractures.  Labs were significant for WBC 13, sodium 134, CO2 18, BUN 33, creatinine 2.05, glucose 261, and anion gap 16.  CT maxillofacial imaging was significant for communicated nasal fractures with slight depression on the right with old plate and screw repair of left  maxillary fracture.  CT imaging of the chest gave concern for possible minimal right middle and/or lower lobe pneumonia.  Right upper lobe density was noted to be unchanged in size.  Patient has been given empiric antibiotics of Rocephin, azithromycin, and Tdap booster.  TRH consulted for admission for further evaluation management.  Assessment & Plan:   Acute on chronic respiratory failure with hypoxia Community-acquired pneumonia Lactic acidosis Patient was noted to be hypoxic down to 86% with improvement on 3 L of nasal cannula oxygen.  CT chest with empyhsema and pulm scarring, mild residual patchy density possible minimal RML pneumonia.  Persistent patchy density in right lower lobe with bronchial opacification, possible RLL bronchopneumonia. Perviously seen RUL density anteriorly has not enlarged.  Procalcitonin elevated 4.28 [place] 4.8. -- Azithromycin 500 mg IV every 24 hours -- Ceftriaxone 2 g IV every 24 hours -- Continue supplemental oxygen, continues require 3 L nasal cannula -- Supportive care, antitussives, incentive spirometry, flutter valve -- Recommend repeat imaging outpatient 1 month   Transient hypotension Blood pressures initially were reviewed as low as 72/48.  After initial fluid bolus patient's blood pressures improved --Discontinue IV fluids today --Continue monitor BP closely   Acute kidney injury superimposed on chronic kidney disease stage IIIa On admission patient creatinine elevated 2.05 with BUN 33.  Baseline creatinine previously noted to be around 1.3. -- Cr 2.05>2.00>1.60 -- DC IVF today   Nasal fracture secondary to fall at home  Facial  Trauma  Mechanical Fall Patient was noted to have a fall at home.  Maxillofacial imaging noted communicated nasal fractures with slight depression on the right.  Seen by PT and OT with recommendation of SNF placement.  Patient and family have decided to forego SNF placement and prefer home with home health.   Fast-absorbing gut sutures placed to facial lacerations x 7 with Dermabond on nose in the ED.  Recommend outpatient follow-up with ENT.   Diabetes mellitus type 2, with hyperglycemia Hemoglobin A1c 7.9, not optimally controlled.  Home medication regimen includes 70/30 insulin 12 units a.m. and 8 units in the evening. --Continue NPH 12u qAm and 8u qPM --SSI for coverage --CBGs qAC/HS   Chronic back pain --Continue hydrocodone as needed for pain   BPH --Continue Flomax 0.4 mg p.o. daily   History of right upper lobe nodule Seen on CT imaging thought to be concerning for primary bronchogenic carcinoma. PET scan was noted to be positive but he declined bronchoscopy/biopsy in the past.  CT here notes previously seen RUL density anteriorly has not enlarged and areas that were solid appearing previously show some aeration, suggests this was benign bronchial dz and pulm scarring.  Radiology recommends continued follow up (though not sure this would be desired based on his desire not to pursue procedure in past?).  Follow-up with PCP   Tobacco abuse Patient continues to smoke on a regular basis   GERD --Continue Protonix 40 mg p.o. daily  HLD: Pravastatin 40 mg p.o. daily     DVT prophylaxis: enoxaparin (LOVENOX) injection 30 mg Start: 06/10/22 2200    Code Status: DNR Family Communication: Updated family present at bedside  Disposition Plan:  Level of care: Progressive Status is: Inpatient Remains inpatient appropriate because: IV antibiotics, anticipate discharge with home health tomorrow    Consultants:  None  Procedures:  None  Antimicrobials:  Azithromycin 12/18>> Ceftriaxone 12/18>>   Subjective: Patient seen examined bedside, resting comfortably.  Lying in bed.  No specific complaints this morning.  Family present at bedside.  Anticipate discharge home tomorrow with home health.  Family requesting wheelchair.  Also discussed will likely need continuous oxygen, not just  at nighttime.  No other specific questions, concerns or complaints at this time.  Patient denies headache, no dizziness, no chest pain, no palpitations, no abdominal pain, no fever/chills/night sweats, no nausea/vomiting/diarrhea, no focal weakness, no fatigue, no paresthesias.  No acute events overnight per nursing staff.  Objective: Vitals:   06/12/22 0816 06/12/22 0822 06/12/22 1000 06/12/22 1200  BP:    129/61  Pulse:  85 84 82  Resp:  (!) 25 20 19   Temp:    97.8 F (36.6 C)  TempSrc:    Axillary  SpO2: (!) 89% 97% 90% 92%  Weight:      Height:        Intake/Output Summary (Last 24 hours) at 06/12/2022 1452 Last data filed at 06/12/2022 0900 Gross per 24 hour  Intake 2358.63 ml  Output 750 ml  Net 1608.63 ml   Filed Weights   06/10/22 0817 06/10/22 0911 06/11/22 2304  Weight: 58 kg (S) 52.2 kg 58.3 kg    Examination:  Physical Exam: GEN: NAD, alert and oriented x 3, chronically ill in appearance, appears older than stated age hard of hearing HEENT: PERRL, EOMI, sclera clear, MMM, noted facial lacerations with sutures in place, nasal fracture and multiple areas of ecchymosis PULM: Breath sounds slightly decreased bilateral bases, no wheezing/crackles, normal respiratory effort without accessory  muscle use, on 3 L nasal cannula CV: RRR w/o M/G/R GI: abd soft, NTND, NABS, no R/G/M MSK: no peripheral edema, moves all extremity independently NEURO: CN II-XII intact, no focal deficits, sensation to light touch intact PSYCH: normal mood/affect Integumentary: Multiple areas of ecchymosis, otherwise no other concerning rashes/lesions/wounds noted on exposed skin surfaces.     Data Reviewed: I have personally reviewed following labs and imaging studies  CBC: Recent Labs  Lab 06/10/22 0835 06/10/22 0843 06/11/22 0420  WBC 13.0*  --  12.6*  HGB 13.1 13.3 10.9*  HCT 39.9 39.0 33.7*  MCV 89.7  --  89.6  PLT 151  --  053*   Basic Metabolic Panel: Recent Labs  Lab  06/10/22 0835 06/10/22 0843 06/11/22 0420  NA 134* 136 134*  K 4.6 4.7 4.2  CL 100 103 106  CO2 18*  --  18*  GLUCOSE 261* 258* 155*  BUN 33* 36* 35*  CREATININE 2.05* 2.00* 1.60*  CALCIUM 8.8*  --  7.9*   GFR: Estimated Creatinine Clearance: 27.8 mL/min (A) (by C-G formula based on SCr of 1.6 mg/dL (H)). Liver Function Tests: Recent Labs  Lab 06/10/22 0835  AST 36  ALT 17  ALKPHOS 52  BILITOT 0.9  PROT 8.1  ALBUMIN 3.2*   No results for input(s): "LIPASE", "AMYLASE" in the last 168 hours. No results for input(s): "AMMONIA" in the last 168 hours. Coagulation Profile: Recent Labs  Lab 06/10/22 0835  INR 1.3*   Cardiac Enzymes: Recent Labs  Lab 06/10/22 1841  CKTOTAL 718*   BNP (last 3 results) No results for input(s): "PROBNP" in the last 8760 hours. HbA1C: Recent Labs    06/10/22 1842  HGBA1C 7.9*   CBG: Recent Labs  Lab 06/11/22 1657 06/12/22 0019 06/12/22 0808 06/12/22 0814 06/12/22 1205  GLUCAP 104* 98 221* 225* 146*   Lipid Profile: No results for input(s): "CHOL", "HDL", "LDLCALC", "TRIG", "CHOLHDL", "LDLDIRECT" in the last 72 hours. Thyroid Function Tests: No results for input(s): "TSH", "T4TOTAL", "FREET4", "T3FREE", "THYROIDAB" in the last 72 hours. Anemia Panel: No results for input(s): "VITAMINB12", "FOLATE", "FERRITIN", "TIBC", "IRON", "RETICCTPCT" in the last 72 hours. Sepsis Labs: Recent Labs  Lab 06/10/22 0835 06/10/22 1841 06/10/22 1842  PROCALCITON  --  4.28  --   LATICACIDVEN 4.8*  --  1.9    Recent Results (from the past 240 hour(s))  Resp panel by RT-PCR (RSV, Flu A&B, Covid) Anterior Nasal Swab     Status: None   Collection Time: 06/10/22  8:43 AM   Specimen: Anterior Nasal Swab  Result Value Ref Range Status   SARS Coronavirus 2 by RT PCR NEGATIVE NEGATIVE Final    Comment: (NOTE) SARS-CoV-2 target nucleic acids are NOT DETECTED.  The SARS-CoV-2 RNA is generally detectable in upper respiratory specimens during  the acute phase of infection. The lowest concentration of SARS-CoV-2 viral copies this assay can detect is 138 copies/mL. A negative result does not preclude SARS-Cov-2 infection and should not be used as the sole basis for treatment or other patient management decisions. A negative result may occur with  improper specimen collection/handling, submission of specimen other than nasopharyngeal swab, presence of viral mutation(s) within the areas targeted by this assay, and inadequate number of viral copies(<138 copies/mL). A negative result must be combined with clinical observations, patient history, and epidemiological information. The expected result is Negative.  Fact Sheet for Patients:  EntrepreneurPulse.com.au  Fact Sheet for Healthcare Providers:  IncredibleEmployment.be  This test is no  t yet approved or cleared by the [place] and  has been authorized for detection and/or diagnosis of SARS-CoV-2 by FDA under an Emergency Use Authorization (EUA). This EUA will remain  in effect (meaning this test can be used) for the duration of the COVID-19 declaration under Section 564(b)(1) of the Act, 21 U.S.C.section 360bbb-3(b)(1), unless the authorization is terminated  or revoked sooner.       Influenza A by PCR NEGATIVE NEGATIVE Final   Influenza B by PCR NEGATIVE NEGATIVE Final    Comment: (NOTE) The Xpert Xpress SARS-CoV-2/FLU/RSV plus assay is intended as an aid in the diagnosis of influenza from Nasopharyngeal swab specimens and should not be used as a sole basis for treatment. Nasal washings and aspirates are unacceptable for Xpert Xpress SARS-CoV-2/FLU/RSV testing.  Fact Sheet for Patients: EntrepreneurPulse.com.au  Fact Sheet for Healthcare Providers: IncredibleEmployment.be  This test is not yet approved or cleared by the [place] FDA and has been authorized for detection and/or  diagnosis of SARS-CoV-2 by FDA under an Emergency Use Authorization (EUA). This EUA will remain in effect (meaning this test can be used) for the duration of the COVID-19 declaration under Section 564(b)(1) of the Act, 21 U.S.C. section 360bbb-3(b)(1), unless the authorization is terminated or revoked.     Resp Syncytial Virus by PCR NEGATIVE NEGATIVE Final    Comment: (NOTE) Fact Sheet for Patients: EntrepreneurPulse.com.au  Fact Sheet for Healthcare Providers: IncredibleEmployment.be  This test is not yet approved or cleared by the [place] FDA and has been authorized for detection and/or diagnosis of SARS-CoV-2 by FDA under an Emergency Use Authorization (EUA). This EUA will remain in effect (meaning this test can be used) for the duration of the COVID-19 declaration under Section 564(b)(1) of the Act, 21 U.S.C. section 360bbb-3(b)(1), unless the authorization is terminated or revoked.  Performed at [facility], [place] [address], [place], [place] [address]          Radiology Studies: DG Foot 2 Views Left  Result Date: 06/11/2022 CLINICAL DATA:  Pain, recent fall EXAM: LEFT FOOT - 2 VIEW COMPARISON:  None Available. FINDINGS: No displaced fracture or dislocation is seen. There is mild hallux valgus deformity. IMPRESSION: No recent fracture or dislocation is seen in left foot. Electronically Signed   By: Elmer Picker M.D.   On: 06/11/2022 13:05   DG Tibia/Fibula Left  Result Date: 06/11/2022 CLINICAL DATA:  Left lower leg pain, fall EXAM: LEFT TIBIA AND FIBULA - 2 VIEW COMPARISON:  None Available. FINDINGS: No acute bony abnormality. Specifically, no fracture, subluxation, or dislocation. Joint spaces maintained. Soft tissues are intact. Vascular calcifications noted. IMPRESSION: No acute bony abnormality. Electronically Signed   By: Rolm Baptise M.D.   On: 06/11/2022 13:03        Scheduled Meds:  albuterol  2.5 mg  Nebulization BID   aspirin  325 mg Oral Daily   enoxaparin (LOVENOX) injection  30 mg Subcutaneous Q24H   feeding supplement  237 mL Oral BID BM   guaiFENesin  10 mL Oral Q6H   insulin aspart  0-9 Units Subcutaneous TID WC   insulin aspart protamine- aspart  12 Units Subcutaneous Q breakfast   insulin aspart protamine- aspart  8 Units Subcutaneous Q supper   multivitamin with minerals  1 tablet Oral Daily   mouth rinse  15 mL Mouth Rinse 4 times per day   pantoprazole  40 mg Oral Daily   pravastatin  40 mg Oral QHS  sodium chloride flush  3 mL Intravenous Q12H   tamsulosin  0.4 mg Oral Daily   Continuous Infusions:  sodium chloride 100 mL/hr at 06/12/22 0617   azithromycin 500 mg (06/12/22 1128)   cefTRIAXone (ROCEPHIN)  IV 2 g (06/12/22 0832)     LOS: 2 days    Time spent: 52 minutes spent on chart review, discussion with nursing staff, consultants, updating family and interview/physical exam; more than 50% of that time was spent in counseling and/or coordination of care.    Keyla Milone J [place] DO [facility] Available via Epic secure chat 7am-7pm After these hours, please refer to coverage provider listed on [email or web address] 06/12/2022, 2:52 PM

## 2022-06-13 DIAGNOSIS — J9621 Acute and chronic respiratory failure with hypoxia: Secondary | ICD-10-CM | POA: Diagnosis not present

## 2022-06-13 LAB — BLOOD GAS, VENOUS
Acid-base deficit: 5.8 mmol/L — ABNORMAL HIGH (ref 0.0–2.0)
Bicarbonate: 19.5 mmol/L — ABNORMAL LOW (ref 20.0–28.0)
Drawn by: 65980
O2 Saturation: 76.9 %
Patient temperature: 36.1
pCO2, Ven: 36 mmHg — ABNORMAL LOW (ref 44–60)
pH, Ven: 7.34 (ref 7.25–7.43)
pO2, Ven: 43 mmHg (ref 32–45)

## 2022-06-13 LAB — GLUCOSE, CAPILLARY
Glucose-Capillary: 145 mg/dL — ABNORMAL HIGH (ref 70–99)
Glucose-Capillary: 167 mg/dL — ABNORMAL HIGH (ref 70–99)
Glucose-Capillary: 208 mg/dL — ABNORMAL HIGH (ref 70–99)
Glucose-Capillary: 220 mg/dL — ABNORMAL HIGH (ref 70–99)

## 2022-06-13 MED ORDER — SALINE SPRAY 0.65 % NA SOLN: 1.0000 | NASAL | Status: DC | PRN

## 2022-06-13 NOTE — Progress Notes (Signed)
Physical Therapy Treatment Patient Details Name: [name] MRN: [id number] DOB: [birth year] Today's Date: 06/13/2022   History of Present Illness [name] is a 87 yo male is presented on 06/10/22 after a fall with facial bleeding. Pt with complaints of L knee pain. Imaging demonstrated communicated nasal fractures, otherwise negative. PMHx: CAD, s/p CABG, COPD, (1ppd over 93 yrs), emphysema, history of pulmonary lesion and PNA, HTN (but meds stopped due to hypotension), CKD, and DM    PT Comments    Good progression today, showing improved independence with functional mobility. Ambulating in room with RW for support at min guard level. Family present, participated, and very supportive. They feel much more comfortable with patient's mobility today. SpO2 on RA did slowly decline to 85%, back to low-mid 90s on 3L at rest. Patient will continue to benefit from skilled physical therapy services to further improve independence with functional mobility.   Recommendations for follow up therapy are one component of a multi-disciplinary discharge planning process, led by the attending physician.  Recommendations may be updated based on patient status, additional functional criteria and insurance authorization.  Follow Up Recommendations  Home health PT     Assistance Recommended at Discharge Frequent or constant Supervision/Assistance  Patient can return home with the following Assistance with cooking/housework;Help with stairs or ramp for entrance;A little help with walking and/or transfers;A little help with bathing/dressing/bathroom;Assist for transportation   Equipment Recommendations  Rolling walker (2 wheels);BSC/3in1;Wheelchair (measurements PT);Wheelchair cushion (measurements PT)    Recommendations for Other Services       Precautions / Restrictions Precautions Precautions: Fall Precaution Comments: extremely HOH Restrictions Weight Bearing Restrictions: No      Mobility  Bed Mobility Overal bed mobility: Needs Assistance Bed Mobility: Supine to Sit     Supine to sit: Supervision     General bed mobility comments: Supervision for safety and to assist with multiple lines/leads, no physical assist needed.    Transfers Overall transfer level: Needs assistance Equipment used: Rolling walker (2 wheels) Transfers: Sit to/from Stand Sit to Stand: Min assist           General transfer comment: CGA assist, effortful to rise, minimal instability with use of RW for support.    Ambulation/Gait Ambulation/Gait assistance: Min guard Gait Distance (Feet): 26 Feet Assistive device: Rolling walker (2 wheels) Gait Pattern/deviations: Step-to pattern, Decreased stance time - left Gait velocity: decreased Gait velocity interpretation: <1.31 ft/sec, indicative of household ambulator   General Gait Details: Ambulating throughout room with RW, light support for stability, educated on use and proximity to device, navigating well. SpO2 slowly decreased to 85% on RA, improved rapidly with 3L to low 90s. Min guard for safety, no buckling noted, denies dizziness.   Stairs             Wheelchair Mobility    Modified Rankin (Stroke Patients Only)       Balance Overall balance assessment: Needs assistance Sitting-balance support: Feet supported Sitting balance-Leahy Scale: Good     Standing balance support: No upper extremity supported, During functional activity Standing balance-Leahy Scale: Fair Standing balance comment: Stood for a couple of minutes beside bed.                            Cognition Arousal/Alertness: Awake/alert Behavior During Therapy: WFL for tasks assessed/performed Overall Cognitive Status: Difficult to assess  General Comments: Seeming wfl, but difficult due to deafness.        Exercises General Exercises - Lower Extremity Ankle Circles/Pumps: AROM,  Both, Supine, 10 reps Quad Sets: Strengthening, Both, 10 reps, Supine Straight Leg Raises: Strengthening, Both, 10 reps, Seated    General Comments General comments (skin integrity, edema, etc.): Grandaughter present and assisting during session. Pt was on 3 L O2 with sats 95% , tried RA but sats dropped to 85% requiring O2 replaced. 2/4 dyspnea      Pertinent Vitals/Pain Pain Assessment Pain Assessment: Faces Faces Pain Scale: Hurts little more Pain Location: Lt side, face Pain Descriptors / Indicators: Discomfort, Guarding Pain Intervention(s): Monitored during session, Repositioned    Home Living                          Prior Function            PT Goals (current goals can now be found in the care plan section) Acute Rehab PT Goals Patient Stated Goal: decrease pain; return home with 24 hr support PT Goal Formulation: With patient/family Time For Goal Achievement: 06/25/22 Potential to Achieve Goals: Good Progress towards PT goals: Progressing toward goals    Frequency    Min 3X/week      PT Plan Current plan remains appropriate    Co-evaluation              AM-PAC PT "6 Clicks" Mobility   Outcome Measure  Help needed turning from your back to your side while in a flat bed without using bedrails?: A Little Help needed moving from lying on your back to sitting on the side of a flat bed without using bedrails?: A Little Help needed moving to and from a bed to a chair (including a wheelchair)?: A Little Help needed standing up from a chair using your arms (e.g., wheelchair or bedside chair)?: A Little Help needed to walk in hospital room?: A Little Help needed climbing 3-5 steps with a railing? : Total 6 Click Score: 16    End of Session Equipment Utilized During Treatment: Gait belt Activity Tolerance: Patient tolerated treatment well Patient left: with call bell/phone within reach;with family/visitor present;in chair;with chair alarm  set Nurse Communication: Mobility status PT Visit Diagnosis: Other abnormalities of gait and mobility (R26.89);Pain Pain - Right/Left: Left Pain - part of body: Knee     Time: 4035-2481 PT Time Calculation (min) (ACUTE ONLY): 20 min  Charges:  $Gait Training: 8-22 mins                     Candie Mile, PT, DPT Physical Therapist Acute Rehabilitation Services [facility]    Ellouise Newer 06/13/2022, 11:39 AM

## 2022-06-13 NOTE — Progress Notes (Signed)
Phlebotomy called regarding URGENT/STAT order that was placed at 2338. Per phlebotomist, they will be on the floor to collect blood sample in few minutes.

## 2022-06-13 NOTE — Progress Notes (Addendum)
PROGRESS NOTE    [name]  MHD:[id number] DOB: 05/13/1935 DOA: 06/10/2022 PCP: Susy Frizzle, MD    Brief Narrative:   [name] is a 87 y.o. male with past medical history significant for hypertension, hyperlipidemia, CAD, atrial flutter/SVT s/p ablation, and chronic home oxygen at night, DM type II, COPD, CKD, and tobacco abuse who presents after having a fall.  Patient lives at home alone, but granddaughter and son live within 5 minutes.  At baseline patient ambulates without need of assistance and contact history is obtained from the patient's granddaughter at bedside as he has significant hearing loss.  Patient had reported falling sometime around 11 PM-12 AM last night..  Patient does not recall how he fell but he did hit his face.  He was able to get up and walk to the bedroom where he possibly slept before having to crawl out of his bedroom to reach the phone and call for help around 6 AM this morning.  He complains of having pain in his left shoulder and left knee.  Previous right upper lobe spiculated lung nodule concerning for primary bronchogenic carcinoma was was not ever biopsied due to patient's overall frail condition at baseline.  She reports that he still continues to smoke on a regular basis and has no goal of quitting.  He also is on hydrocodone due to chronic back pain.   In the emergency department patient was seen as a level 1 trauma.  Patient was noted to be afebrile with respirations 14-29, blood pressure as low as 72/48 with improvement with IV fluids, O2 saturations as low as 86% with improvement on 3 L of nasal cannula oxygen.  X-rays of the chest, knee, and pelvis did not note any acute fractures.  Labs were significant for WBC 13, sodium 134, CO2 18, BUN 33, creatinine 2.05, glucose 261, and anion gap 16.  CT maxillofacial imaging was significant for communicated nasal fractures with slight depression on the right with old plate and screw repair of left  maxillary fracture.  CT imaging of the chest gave concern for possible minimal right middle and/or lower lobe pneumonia.  Right upper lobe density was noted to be unchanged in size.  Patient has been given empiric antibiotics of Rocephin, azithromycin, and Tdap booster.  TRH consulted for admission for further evaluation management.  Assessment & Plan:   Acute on chronic respiratory failure with hypoxia Community-acquired pneumonia Lactic acidosis Patient was noted to be hypoxic down to 86% with improvement on 3 L of nasal cannula oxygen.  CT chest with empyhsema and pulm scarring, mild residual patchy density possible minimal RML pneumonia.  Persistent patchy density in right lower lobe with bronchial opacification, possible RLL bronchopneumonia. Perviously seen RUL density anteriorly has not enlarged.  Procalcitonin elevated 4.28 [place] 4.8. -- Azithromycin 500 mg IV every 24 hours -- Ceftriaxone 2 g IV every 24 hours -- Continue supplemental oxygen, continues require 3 L nasal cannula -- Supportive care, antitussives, incentive spirometry, flutter valve -- Recommend repeat imaging outpatient 1 month   Transient hypotension Blood pressures initially were reviewed as low as 72/48.  After initial fluid bolus patient's blood pressures improved --Discontinue IV fluids today --Continue monitor BP closely   Acute kidney injury superimposed on chronic kidney disease stage IIIa On admission patient creatinine elevated 2.05 with BUN 33.  Baseline creatinine previously noted to be around 1.3.  IV fluids discontinued on 12/20. -- Cr 2.05>2.00>1.60 -- Repeat BMP in the a.m.   Nasal  fracture secondary to fall at home  Facial Trauma  Mechanical Fall Patient was noted to have a fall at home.  Maxillofacial imaging noted communicated nasal fractures with slight depression on the right.  Seen by PT and OT with recommendation of SNF placement.  Patient and family have decided to forego SNF placement and  prefer home with home health.  Fast-absorbing gut sutures placed to facial lacerations x 7 with Dermabond on nose in the ED.  Recommend outpatient follow-up with ENT.   Diabetes mellitus type 2, with hyperglycemia Hemoglobin A1c 7.9, not optimally controlled.  Home medication regimen includes 70/30 insulin 12 units a.m. and 8 units in the evening. --Continue NPH 12u qAm and 8u qPM --SSI for coverage --CBGs qAC/HS   Chronic back pain --Continue hydrocodone as needed for pain   BPH --Continue Flomax 0.4 mg p.o. daily   History of right upper lobe nodule Seen on CT imaging thought to be concerning for primary bronchogenic carcinoma. PET scan was noted to be positive but he declined bronchoscopy/biopsy in the past.  CT here notes previously seen RUL density anteriorly has not enlarged and areas that were solid appearing previously show some aeration, suggests this was benign bronchial dz and pulm scarring.  Radiology recommends continued follow up (though not sure this would be desired based on his desire not to pursue procedure in past?).  Follow-up with PCP   Tobacco abuse Patient continues to smoke on a regular basis; counseled on need for cessation   GERD --Continue Protonix 40 mg p.o. daily  HLD:  --Hold home Pravastatin for elevated CK level. --repeat CK level in the am     DVT prophylaxis: enoxaparin (LOVENOX) injection 30 mg Start: 06/10/22 2200    Code Status: DNR Family Communication: Updated family present at bedside  Disposition Plan:  Level of care: Med-Surg Status is: Inpatient Remains inpatient appropriate because: IV antibiotics, anticipate discharge with home health likely tomorrow    Consultants:  None  Procedures:  None  Antimicrobials:  Azithromycin 12/18>> Ceftriaxone 12/18>>   Subjective: Patient seen examined bedside, resting comfortably.  Lying in bed.  No specific complaints this morning.  Family present at bedside.  Anticipate discharge home  tomorrow with home health. No other specific questions, concerns at this time.  Patient denies headache, no dizziness, no chest pain, no palpitations, no abdominal pain, no fever/chills/night sweats, no nausea/vomiting/diarrhea, no focal weakness, no fatigue, no paresthesias.  No acute events overnight per nursing staff.  Objective: Vitals:   06/12/22 2141 06/13/22 0009 06/13/22 0606 06/13/22 0800  BP: (!) 140/55 124/66 (!) 154/72 (!) 153/74  Pulse: 88 79 78 80  Resp: (!) 22  (!) 22 20  Temp:  98.9 F (37.2 C) 97.7 F (36.5 C) 97.9 F (36.6 C)  TempSrc:  Axillary Oral Oral  SpO2: 96% 97% 96% 91%  Weight:      Height:        Intake/Output Summary (Last 24 hours) at 06/13/2022 1218 Last data filed at 06/13/2022 0600 Gross per 24 hour  Intake 240 ml  Output 1250 ml  Net -1010 ml   Filed Weights   06/10/22 0817 06/10/22 0911 06/11/22 2304  Weight: 58 kg (S) 52.2 kg 58.3 kg    Examination:  Physical Exam: GEN: NAD, alert and oriented x 3, chronically ill in appearance, appears older than stated age hard of hearing HEENT: PERRL, EOMI, sclera clear, MMM, noted facial lacerations with sutures in place, nasal fracture and multiple areas of ecchymosis PULM:  Breath sounds slightly decreased bilateral bases, no wheezing/crackles, normal respiratory effort without accessory muscle use, on 3 L nasal cannula CV: RRR w/o M/G/R GI: abd soft, NTND, NABS, no R/G/M MSK: no peripheral edema, moves all extremity independently NEURO: CN II-XII intact, no focal deficits, sensation to light touch intact PSYCH: normal mood/affect Integumentary: Multiple areas of ecchymosis, otherwise no other concerning rashes/lesions/wounds noted on exposed skin surfaces.     Data Reviewed: I have personally reviewed following labs and imaging studies  CBC: Recent Labs  Lab 06/10/22 0835 06/10/22 0843 06/11/22 0420  WBC 13.0*  --  12.6*  HGB 13.1 13.3 10.9*  HCT 39.9 39.0 33.7*  MCV 89.7  --  89.6  PLT  151  --  678*   Basic Metabolic Panel: Recent Labs  Lab 06/10/22 0835 06/10/22 0843 06/11/22 0420  NA 134* 136 134*  K 4.6 4.7 4.2  CL 100 103 106  CO2 18*  --  18*  GLUCOSE 261* 258* 155*  BUN 33* 36* 35*  CREATININE 2.05* 2.00* 1.60*  CALCIUM 8.8*  --  7.9*   GFR: Estimated Creatinine Clearance: 27.8 mL/min (A) (by C-G formula based on SCr of 1.6 mg/dL (H)). Liver Function Tests: Recent Labs  Lab 06/10/22 0835  AST 36  ALT 17  ALKPHOS 52  BILITOT 0.9  PROT 8.1  ALBUMIN 3.2*   No results for input(s): "LIPASE", "AMYLASE" in the last 168 hours. No results for input(s): "AMMONIA" in the last 168 hours. Coagulation Profile: Recent Labs  Lab 06/10/22 0835  INR 1.3*   Cardiac Enzymes: Recent Labs  Lab 06/10/22 1841  CKTOTAL 718*   BNP (last 3 results) No results for input(s): "PROBNP" in the last 8760 hours. HbA1C: Recent Labs    06/10/22 1842  HGBA1C 7.9*   CBG: Recent Labs  Lab 06/12/22 0814 06/12/22 1205 06/12/22 1621 06/12/22 2129 06/13/22 0827  GLUCAP 225* 146* 250* 78 145*   Lipid Profile: No results for input(s): "CHOL", "HDL", "LDLCALC", "TRIG", "CHOLHDL", "LDLDIRECT" in the last 72 hours. Thyroid Function Tests: No results for input(s): "TSH", "T4TOTAL", "FREET4", "T3FREE", "THYROIDAB" in the last 72 hours. Anemia Panel: No results for input(s): "VITAMINB12", "FOLATE", "FERRITIN", "TIBC", "IRON", "RETICCTPCT" in the last 72 hours. Sepsis Labs: Recent Labs  Lab 06/10/22 0835 06/10/22 1841 06/10/22 1842  PROCALCITON  --  4.28  --   LATICACIDVEN 4.8*  --  1.9    Recent Results (from the past 240 hour(s))  Resp panel by RT-PCR (RSV, Flu A&B, Covid) Anterior Nasal Swab     Status: None   Collection Time: 06/10/22  8:43 AM   Specimen: Anterior Nasal Swab  Result Value Ref Range Status   SARS Coronavirus 2 by RT PCR NEGATIVE NEGATIVE Final    Comment: (NOTE) SARS-CoV-2 target nucleic acids are NOT DETECTED.  The SARS-CoV-2 RNA is  generally detectable in upper respiratory specimens during the acute phase of infection. The lowest concentration of SARS-CoV-2 viral copies this assay can detect is 138 copies/mL. A negative result does not preclude SARS-Cov-2 infection and should not be used as the sole basis for treatment or other patient management decisions. A negative result may occur with  improper specimen collection/handling, submission of specimen other than nasopharyngeal swab, presence of viral mutation(s) within the areas targeted by this assay, and inadequate number of viral copies(<138 copies/mL). A negative result must be combined with clinical observations, patient history, and epidemiological information. The expected result is Negative.  Fact Sheet for Patients:  EntrepreneurPulse.com.au  Fact Sheet for Healthcare Providers:  IncredibleEmployment.be  This test is no t yet approved or cleared by the [place] FDA and  has been authorized for detection and/or diagnosis of SARS-CoV-2 by FDA under an Emergency Use Authorization (EUA). This EUA will remain  in effect (meaning this test can be used) for the duration of the COVID-19 declaration under Section 564(b)(1) of the Act, 21 U.S.C.section 360bbb-3(b)(1), unless the authorization is terminated  or revoked sooner.       Influenza A by PCR NEGATIVE NEGATIVE Final   Influenza B by PCR NEGATIVE NEGATIVE Final    Comment: (NOTE) The Xpert Xpress SARS-CoV-2/FLU/RSV plus assay is intended as an aid in the diagnosis of influenza from Nasopharyngeal swab specimens and should not be used as a sole basis for treatment. Nasal washings and aspirates are unacceptable for Xpert Xpress SARS-CoV-2/FLU/RSV testing.  Fact Sheet for Patients: EntrepreneurPulse.com.au  Fact Sheet for Healthcare Providers: IncredibleEmployment.be  This test is not yet approved or cleared by the [place] FDA and has been authorized for detection and/or diagnosis of SARS-CoV-2 by FDA under an Emergency Use Authorization (EUA). This EUA will remain in effect (meaning this test can be used) for the duration of the COVID-19 declaration under Section 564(b)(1) of the Act, 21 U.S.C. section 360bbb-3(b)(1), unless the authorization is terminated or revoked.     Resp Syncytial Virus by PCR NEGATIVE NEGATIVE Final    Comment: (NOTE) Fact Sheet for Patients: EntrepreneurPulse.com.au  Fact Sheet for Healthcare Providers: IncredibleEmployment.be  This test is not yet approved or cleared by the [place] FDA and has been authorized for detection and/or diagnosis of SARS-CoV-2 by FDA under an Emergency Use Authorization (EUA). This EUA will remain in effect (meaning this test can be used) for the duration of the COVID-19 declaration under Section 564(b)(1) of the Act, 21 U.S.C. section 360bbb-3(b)(1), unless the authorization is terminated or revoked.  Performed at [facility], [place] [address], [place], [place] [address]          Radiology Studies: DG Foot 2 Views Left  Result Date: 06/11/2022 CLINICAL DATA:  Pain, recent fall EXAM: LEFT FOOT - 2 VIEW COMPARISON:  None Available. FINDINGS: No displaced fracture or dislocation is seen. There is mild hallux valgus deformity. IMPRESSION: No recent fracture or dislocation is seen in left foot. Electronically Signed   By: Elmer Picker M.D.   On: 06/11/2022 13:05   DG Tibia/Fibula Left  Result Date: 06/11/2022 CLINICAL DATA:  Left lower leg pain, fall EXAM: LEFT TIBIA AND FIBULA - 2 VIEW COMPARISON:  None Available. FINDINGS: No acute bony abnormality. Specifically, no fracture, subluxation, or dislocation. Joint spaces maintained. Soft tissues are intact. Vascular calcifications noted. IMPRESSION: No acute bony abnormality. Electronically Signed   By: Rolm Baptise M.D.   On:  06/11/2022 13:03        Scheduled Meds:  albuterol  2.5 mg Nebulization BID   aspirin  325 mg Oral Daily   azithromycin  500 mg Oral Daily   enoxaparin (LOVENOX) injection  30 mg Subcutaneous Q24H   feeding supplement  237 mL Oral BID BM   guaiFENesin  10 mL Oral Q6H   insulin aspart  0-9 Units Subcutaneous TID WC   insulin aspart protamine- aspart  12 Units Subcutaneous Q breakfast   insulin aspart protamine- aspart  8 Units Subcutaneous Q supper   multivitamin with minerals  1 tablet Oral Daily   mouth rinse  15 mL Mouth Rinse 4  times per day   pantoprazole  40 mg Oral Daily   pravastatin  40 mg Oral QHS   sodium chloride flush  3 mL Intravenous Q12H   tamsulosin  0.4 mg Oral Daily   Continuous Infusions:  cefTRIAXone (ROCEPHIN)  IV 2 g (06/13/22 0923)     LOS: 3 days    Time spent: 52 minutes spent on chart review, discussion with nursing staff, consultants, updating family and interview/physical exam; more than 50% of that time was spent in counseling and/or coordination of care.    Kayelynn Abdou J [place] DO [facility] Available via Epic secure chat 7am-7pm After these hours, please refer to coverage provider listed on [email or web address] 06/13/2022, 12:18 PM

## 2022-06-13 NOTE — Care Management Important Message (Signed)
Important Message  Patient Details  Name: [name] MRN: [id number] Date of Birth: 06/26/1934   Medicare Important Message Given:  Yes     [name] [address] 06/13/2022, 3:45 PM

## 2022-06-13 NOTE — TOC CAGE-AID Note (Signed)
Transition of Care [facility]) - CAGE-AID Screening   Patient Details  Name: [name] MRN: [id number] Date of Birth: 1935-01-24  Transition of [facility]) CM/SW Contact:    Benard Halsted, LCSW Phone Number: 06/13/2022, 12:00 PM   Clinical Narrative: Patient declined ETOH/substance use so resources not offered.    CAGE-AID Screening:    Have You Ever Felt You Ought to Cut Down on Your Drinking or Drug Use?: No Have People Annoyed You By Critizing Your Drinking Or Drug Use?: No Have You Felt Bad Or Guilty About Your Drinking Or Drug Use?: No Have You Ever Had a Drink or Used Drugs First Thing In The Morning to Steady Your Nerves or to Get Rid of a Hangover?: No CAGE-AID Score: 0  Substance Abuse Education Offered: No

## 2022-06-13 NOTE — Progress Notes (Signed)
Venous blood gas ordered earlier for increased confusion and shallow breathing. Results available in epic.  Marlowe Sax, MD notified.

## 2022-06-13 NOTE — Plan of Care (Signed)

## 2022-06-14 DIAGNOSIS — R0902 Hypoxemia: Secondary | ICD-10-CM | POA: Diagnosis not present

## 2022-06-14 DIAGNOSIS — S022XXA Fracture of nasal bones, initial encounter for closed fracture: Secondary | ICD-10-CM | POA: Diagnosis not present

## 2022-06-14 DIAGNOSIS — W19XXXA Unspecified fall, initial encounter: Secondary | ICD-10-CM | POA: Diagnosis not present

## 2022-06-14 DIAGNOSIS — Z794 Long term (current) use of insulin: Secondary | ICD-10-CM | POA: Diagnosis not present

## 2022-06-14 DIAGNOSIS — J9621 Acute and chronic respiratory failure with hypoxia: Secondary | ICD-10-CM | POA: Diagnosis not present

## 2022-06-14 DIAGNOSIS — J189 Pneumonia, unspecified organism: Secondary | ICD-10-CM | POA: Diagnosis not present

## 2022-06-14 DIAGNOSIS — E1165 Type 2 diabetes mellitus with hyperglycemia: Secondary | ICD-10-CM | POA: Diagnosis not present

## 2022-06-14 DIAGNOSIS — M549 Dorsalgia, unspecified: Secondary | ICD-10-CM | POA: Diagnosis not present

## 2022-06-14 LAB — CBC
HCT: 27.5 % — ABNORMAL LOW (ref 39.0–52.0)
Hemoglobin: 9.5 g/dL — ABNORMAL LOW (ref 13.0–17.0)
MCH: 29.5 pg (ref 26.0–34.0)
MCHC: 34.5 g/dL (ref 30.0–36.0)
MCV: 85.4 fL (ref 80.0–100.0)
Platelets: 155 10*3/uL (ref 150–400)
RBC: 3.22 MIL/uL — ABNORMAL LOW (ref 4.22–5.81)
RDW: 16.8 % — ABNORMAL HIGH (ref 11.5–15.5)
WBC: 8.3 10*3/uL (ref 4.0–10.5)
nRBC: 0 % (ref 0.0–0.2)

## 2022-06-14 LAB — GLUCOSE, CAPILLARY
Glucose-Capillary: 155 mg/dL — ABNORMAL HIGH (ref 70–99)
Glucose-Capillary: 116 mg/dL — ABNORMAL HIGH (ref 70–99)

## 2022-06-14 LAB — BASIC METABOLIC PANEL
Anion gap: 8 (ref 5–15)
BUN: 27 mg/dL — ABNORMAL HIGH (ref 8–23)
CO2: 23 mmol/L (ref 22–32)
Calcium: 8.1 mg/dL — ABNORMAL LOW (ref 8.9–10.3)
Chloride: 105 mmol/L (ref 98–111)
Creatinine, Ser: 1.22 mg/dL (ref 0.61–1.24)
GFR, Estimated: 58 mL/min — ABNORMAL LOW (ref >60)
Glucose, Bld: 167 mg/dL — ABNORMAL HIGH (ref 70–99)
Potassium: 3.7 mmol/L (ref 3.5–5.1)
Sodium: 136 mmol/L (ref 135–145)

## 2022-06-14 LAB — MAGNESIUM: Magnesium: 1.7 mg/dL (ref 1.7–2.4)

## 2022-06-14 LAB — CK: Total CK: 70 U/L (ref 49–397)

## 2022-06-14 MED ORDER — BUDESONIDE-FORMOTEROL FUMARATE 160-4.5 MCG/ACT IN AERO: 2.0000 | INHALATION_SPRAY | Freq: Two times a day (BID) | RESPIRATORY_TRACT | 3 refills | Status: DC

## 2022-06-14 MED ORDER — AZITHROMYCIN 500 MG PO TABS: 500.0000 mg | ORAL_TABLET | Freq: Every day | ORAL | 0 refills | Status: AC

## 2022-06-14 MED ORDER — CEFPODOXIME PROXETIL 200 MG PO TABS: 200.0000 mg | ORAL_TABLET | Freq: Two times a day (BID) | ORAL | 0 refills | Status: AC

## 2022-06-14 MED ORDER — GUAIFENESIN 100 MG/5ML PO LIQD: 10.0000 mL | Freq: Four times a day (QID) | ORAL | 0 refills | Status: AC | PRN

## 2022-06-14 NOTE — Progress Notes (Signed)
Discharge instructions given to the patient and patient's granddaughter.  Both verbalized understanding.  Waiting for DME to be delivered to the room.

## 2022-06-14 NOTE — Discharge Summary (Addendum)
Physician Discharge Summary   Patient: [name] MRN: [id number] DOB: 01/29/1935  Admit date:     06/10/2022  Discharge date: 06/14/22  Discharge Physician: Estill Cotta, MD    PCP: Susy Frizzle, MD   Recommendations at discharge:   Continue Zithromax 500 mg daily for 2 more days Continue Vantin 200 mg twice daily for 2 more days Home health PT OT, RN, aide arranged by [facility] Outpatient follow-up with [facility] for nasal fracture  Discharge Diagnoses:    Acute and chronic respiratory failure with hypoxia ([place])   CAP (community acquired pneumonia)   SIRS (systemic inflammatory response syndrome) (HCC)   Lactic acidosis   Transient hypotension   Acute kidney injury superimposed on chronic kidney disease 3a ([place])   Nasal fracture   Fall at home, initial encounter   Uncontrolled type 2 diabetes mellitus with hyperglycemia, with long-term current use of insulin (HCC)   Chronic back pain   BPH (benign prostatic hyperplasia)   Pulmonary nodule    Hospital Course: Patient is a 87 year old male with HTN, HLP, CAD, A-fib/SVT status post ablation, chronic home O2 at night, diabetes mellitus, COPD, CKD, presented after a mechanical fall.  Patient lives at home alone, but granddaughter and son live within 5 minutes.  At baseline patient ambulates without need of assistance and contact history is obtained from the patient's granddaughter at bedside as he has significant hearing loss.  Patient had reported falling sometime around 11 PM-12 AM at night before the admission.  Patient does not recall how he fell but he did hit his face.  He was able to get up and walk to the bedroom where he possibly slept before having to crawl out of his bedroom to reach the phone and call for help around 6 AM this morning.  He complained of having pain in his left shoulder and left knee.  Previous right upper lobe spiculated lung nodule concerning for primary bronchogenic carcinoma was was not ever  biopsied due to patient's overall frail condition at baseline.  She reports that he still continues to smoke on a regular basis and has no goal of quitting.  He also is on hydrocodone due to chronic back pain.   In ED, CT maxillofacial showed comminuted nasal fracture with slight depression on the right with old plate and screw repair of left maxillary fracture. CT imaging of chest with possible minimal right middle and/or lower lobe pneumonia.  Right upper lobe density unchanged in size. Patient was admitted for further workup.  Trauma surgery was consulted.  Assessment and Plan:  Acute on chronic respiratory failure with hypoxia Community-acquired pneumonia Lactic acidosis -Noted to be hypoxic with O2 sats 86% in ED, improved with 3 L O2 via [place].  -CT chest showed emphysema, mild residual patchy density, possible right lower lobe bronchopneumonia.  Right upper lobe density unchanged in size.   -Patient was placed on IV Zithromax and Rocephin, transition to oral Zithromax and Vantin for 2 more days to complete full course. -Per granddaughter at the bedside has been ambulating in the hallway with no hypoxia.  O2 sats 94% on room air. -Recommend repeat imaging outpatient when 3 to 4 weeks.    Transient hypotension -Patient had transient hypotension with BP as low as 72/48, received IV fluid bolus  - BP improved, 136/74 on discharge   Acute kidney injury superimposed on chronic kidney disease stage IIIa -Creatinine 2.05 on admission, baseline creatinine around 1.3  -Received IV fluid hydration, creatinine 1.2  at discharge.    Nasal fracture secondary to fall at home  Facial Trauma  Mechanical Fall -Patient was noted to have a fall at home.  Maxillofacial imaging noted communicated nasal fractures with slight depression on the right.  -Fast-absorbing gut sutures placed to facial lacerations x 7 with Dermabond on nose in the ED.  Recommend outpatient follow-up with ENT.  -PT OT evaluation  recommended SNF placement.  Patient and family have decided to forego SNF placement and prefer home with home health.     Diabetes mellitus type 2, with hyperglycemia Hemoglobin A1c 7.9, not optimally controlled -Continue outpatient insulin regimen, outpatient follow-up with PCP for any further adjustment in insulin regimen,    Chronic back pain --Continue hydrocodone as needed for pain   BPH --Continue Flomax 0.4 mg p.o. daily   History of right upper lobe nodule Seen on CT imaging thought to be concerning for primary bronchogenic carcinoma. PET scan was noted to be positive but he declined bronchoscopy/biopsy in the past. - CT chest notes previously seen RUL density anteriorly has not enlarged and areas that were solid appearing previously show some aeration, suggests this was benign bronchial dz and pulm scarring.   -Radiology recommended continued follow-up.  Will defer to patient and PCP regarding follow-up as patient has previously declined further workup.   Tobacco abuse Patient continues to smoke on a regular basis; counseled on need for cessation   GERD Continue PPI  HLD:  -Pravastatin falls held due to elevated CK 718 -CK has normalized, patient may resume pravastatin at discharge.  Rhabdomyolysis -Likely due to falls, CK7 118 upon admission, improved to 70 with IV fluid hydration.  Statin was held   Pressure injury Mid sacrum stage II, wound care per nursing      Pain control - [place] was reviewed. and patient was instructed, not to drive, operate heavy machinery, perform activities at heights, swimming or participation in water activities or provide baby-sitting services while on Pain, Sleep and Anxiety Medications; until their outpatient Physician has advised to do so again. Also recommended to not to take more than prescribed Pain, Sleep and Anxiety Medications.  Consultants: Trauma surgery Procedures performed:  None Disposition: Home Diet recommendation:  Discharge Diet Orders (From admission, onward)     Start     Ordered   06/14/22 0000  Diet Carb Modified        06/14/22 1003           DISCHARGE MEDICATION: Allergies as of 06/14/2022       Reactions   Celebrex [celecoxib] Rash, Other (See Comments)   Blisters, also   Tape Other (See Comments)   EKG leads leave red marks and PLEASE USE COBAN WRAP!! Skin tears and bruises easily        Medication List     TAKE these medications    acetaminophen 325 MG tablet Commonly known as: TYLENOL Take 325-650 mg by mouth every 6 (six) hours as needed for mild pain or headache.   albuterol (2.5 MG/3ML) 0.083% nebulizer solution Commonly known as: PROVENTIL Take 3 mLs (2.5 mg total) by nebulization every 6 (six) hours as needed for wheezing or shortness of breath.   aspirin 325 MG tablet Take 325 mg by mouth daily.   azithromycin 500 MG tablet Commonly known as: Zithromax Take 1 tablet (500 mg total) by mouth daily for 2 days.   blood glucose meter kit and supplies Dispense based on patient and insurance  preference. Use up to four times daily as directed. (FOR ICD-10 E10.9, E11.9).   budesonide-formoterol 160-4.5 MCG/ACT inhaler Commonly known as: Symbicort Inhale 2 puffs into the lungs 2 (two) times daily.   cefpodoxime 200 MG tablet Commonly known as: VANTIN Take 1 tablet (200 mg total) by mouth 2 (two) times daily for 2 days.   clobetasol cream 0.05 % Commonly known as: TEMOVATE Please specify directions, refills and quantity What changed: See the new instructions.   clotrimazole 1 % cream Commonly known as: LOTRIMIN Apply 1 application topically 2 (two) times daily. Apply to the head of the penis What changed:  when to take this reasons to take this   guaiFENesin 100 MG/5ML liquid Commonly known as: ROBITUSSIN Take 10 mLs by mouth every 6 (six) hours as needed for cough or to loosen phlegm.    HYDROcodone-acetaminophen 5-325 MG tablet Commonly known as: Norco Take 1 tablet by mouth every 6 (six) hours as needed for moderate pain.   hydrOXYzine 25 MG tablet Commonly known as: ATARAX Take 1 tablet (25 mg total) by mouth 3 (three) times daily as needed for itching.   Insulin Syringe-Needle U-100 31G X 5/16" 0.3 ML Misc Commonly known as: BD Insulin Syringe U/F USE WITH NOVOLIN TWICE DAY WITH A MEAL   nitroGLYCERIN 0.4 MG SL tablet Commonly known as: NITROSTAT Place 0.4 mg under the tongue every 5 (five) minutes as needed for chest pain.   NovoLIN 70/30 (70-30) 100 UNIT/ML injection Generic drug: insulin NPH-regular Human INJECT 8 UNITS SQ IN THE MORNING AND INJECT 12 UNITS SQ AT BEDTIME What changed: See the new instructions.   OneTouch Delica Lancets 44Y Misc USE TO CHECK BLOOD SUAGR 3 TIMES A DAY DX:E11.9   OneTouch Ultra test strip Generic drug: glucose blood USE TO CHECK BLOOD SUGAR 4 TO 6 TIMES DAILY. E11.9   OXYGEN Inhale 4 L/min into the lungs at bedtime.   pantoprazole 40 MG tablet Commonly known as: PROTONIX TAKE 1 TABLET BY MOUTH EVERY DAY   pravastatin 40 MG tablet Commonly known as: PRAVACHOL Take 1 tablet (40 mg total) by mouth daily. What changed: when to take this   tamsulosin 0.4 MG Caps capsule Commonly known as: FLOMAX TAKE 1 CAPSULE BY MOUTH EVERY DAY   triamcinolone cream 0.1 % Commonly known as: KENALOG APPLY TO AFFECTED AREA TWICE A DAY What changed:  how much to take how to take this when to take this reasons to take this additional instructions               Durable Medical Equipment  (From admission, onward)           Start     Ordered   06/14/22 1031  For home use only DME 3 n 1  Once        06/14/22 1030   06/12/22 1455  For home use only DME oxygen  Once       Question Answer Comment  Length of Need Lifetime   Mode or (Route) Nasal cannula   Liters per Minute 3   Frequency Continuous (stationary and  portable oxygen unit needed)   Oxygen conserving device Yes   Oxygen delivery system Gas      06/12/22 1454   06/12/22 1455  For home use only DME standard manual wheelchair with seat cushion  Once       Comments: Patient suffers from gait disturbance which impairs their ability to perform daily activities like ambulating in the home.  A walker will not resolve issue with performing activities of daily living. A wheelchair will allow patient to safely perform daily activities. Patient can safely propel the wheelchair in the home or has a caregiver who can provide assistance. Length of need Lifetime. Accessories: elevating leg rests (ELRs), wheel locks, extensions and anti-tippers.   06/12/22 1454              Discharge Care Instructions  (From admission, onward)           Start     Ordered   06/14/22 0000  If the dressing is still on your incision site when you go home, remove it on the third day after your surgery date. Remove dressing if it begins to fall off, or if it is dirty or damaged before the third day.        06/14/22 1003            [facility] Follow up.   Why: Home health has been arranged. Contact information: [address] [place] [address] [phone]         Susy Frizzle, MD. Schedule an appointment as soon as possible for a visit in 2 week(s).   Specialty: Family Medicine Why: for hospital follow-up Contact information: [address] [place] [address] [phone]         [facility]. Schedule an appointment as soon as possible for a visit in 1 week(s).   Specialty: Otolaryngology Why: for hospital follow-up Contact information: [address] [place] [phone] Additional information: Satellite Office - live on EPIC               Discharge Exam: Danley Danker Weights   06/10/22 9390 06/10/22 0911 06/11/22 2304  Weight: 58 kg (S)  52.2 kg 58.3 kg   S: No acute complaints, per granddaughter at the bedside, patient has been ambulating in the hallway without any difficulty, O2 sats remained stable without O2.  No fevers or chills, no coughing, chest pain.  Feels comfortable going home today   BP 136/74 (BP Location: Left Arm)   Pulse 84   Temp 98.3 F (36.8 C) (Oral)   Resp 18   Ht (S) _0  (1.88 m)   Wt 58.3 kg   SpO2 93%   BMI 16.50 kg/m    Physical Exam General: Alert and oriented x 3, NAD hearing deficit Cardiovascular: S1 S2 clear, RRR.  Respiratory: Diminished breath sound at the bases otherwise fairly clear, no wheezing Gastrointestinal: Soft, nontender, nondistended, NBS Ext: no pedal edema bilaterally Neuro: no new deficits Psych: Normal affect     Condition at discharge: fair  The results of significant diagnostics from this hospitalization (including imaging, microbiology, ancillary and laboratory) are listed below for reference.   Imaging Studies: DG Foot 2 Views Left  Result Date: 06/11/2022 CLINICAL DATA:  Pain, recent fall EXAM: LEFT FOOT - 2 VIEW COMPARISON:  None Available. FINDINGS: No displaced fracture or dislocation is seen. There is mild hallux valgus deformity. IMPRESSION: No recent fracture or dislocation is seen in left foot. Electronically Signed   By: Elmer Picker M.D.   On: 06/11/2022 13:05   DG Tibia/Fibula Left  Result Date: 06/11/2022 CLINICAL DATA:  Left lower leg pain, fall EXAM: LEFT TIBIA AND FIBULA - 2 VIEW COMPARISON:  None Available. FINDINGS: No acute bony abnormality. Specifically, no fracture, subluxation, or dislocation. Joint spaces maintained. Soft tissues  are intact. Vascular calcifications noted. IMPRESSION: No acute bony abnormality. Electronically Signed   By: Rolm Baptise M.D.   On: 06/11/2022 13:03   DG Knee Left Port  Result Date: 06/10/2022 CLINICAL DATA:  Fall.  The swelling and bruising. EXAM: PORTABLE LEFT KNEE - 1-2 VIEW COMPARISON:   10/04/2016 FINDINGS: There is MEDIAL and patellofemoral compartment narrowing. No acute fracture or subluxation. No joint effusion. There is dense atherosclerotic calcification of the popliteal artery. IMPRESSION: Mild degenerative changes. No evidence for acute abnormality. Electronically Signed   By: Nolon Nations M.D.   On: 06/10/2022 09:31   CT CERVICAL SPINE WO CONTRAST  Result Date: 06/10/2022 CLINICAL DATA:  Fall with trauma to the head and neck. EXAM: CT CERVICAL SPINE WITHOUT CONTRAST TECHNIQUE: Multidetector CT imaging of the cervical spine was performed without intravenous contrast. Multiplanar CT image reconstructions were also generated. RADIATION DOSE REDUCTION: This exam was performed according to the departmental dose-optimization program which includes automated exposure control, adjustment of the mA and/or kV according to patient size and/or use of iterative reconstruction technique. COMPARISON:  None Available. FINDINGS: Alignment: Mild rotation of C1 on C2 which is favored to be positional. If the patient can turn the head from right to left this would rule out rotatory subluxation, which is not favored. Skull base and vertebrae: No evidence of fracture or focal lesion. Soft tissues and spinal canal: Vascular calcification. No traumatic soft tissue finding. Disc levels: Mild degenerative spondylosis with endplate osteophytes at C4-5 and C6-7. Facet osteoarthritis worse on the right than the left. No apparent canal stenosis. Bony foraminal narrowing on the right at C3-4, C4-5 and C5-6. Upper chest: Negative acutely.  See results of chest CT. Other: None IMPRESSION: No acute or traumatic finding. Mild rotation of C1 on C2 which is favored to be positional. If the patient can turn the head from right to left, this would rule out rotatory subluxation, which is not favored. Electronically Signed   By: Nelson Chimes M.D.   On: 06/10/2022 09:29   CT MAXILLOFACIAL WO CONTRAST  Result Date:  06/10/2022 CLINICAL DATA:  Fall with trauma to the face. EXAM: CT MAXILLOFACIAL WITHOUT CONTRAST TECHNIQUE: Multidetector CT imaging of the maxillofacial structures was performed. Multiplanar CT image reconstructions were also generated. RADIATION DOSE REDUCTION: This exam was performed according to the departmental dose-optimization program which includes automated exposure control, adjustment of the mA and/or kV according to patient size and/or use of iterative reconstruction technique. COMPARISON:  None Available. FINDINGS: Osseous: Comminuted nasal fractures with slight depression on the right. Old plate and screw repair of a left maxillary fracture. No other acute facial finding. Nasal septum bows towards the left but no septal fracture is established. Orbits: No orbital injury. Sinuses: No inflammatory or traumatic fluid present within the sinuses. Soft tissues: No other soft tissue injury evident. Limited intracranial: Negative IMPRESSION: 1. Comminuted nasal fractures with slight depression on the right. 2. Old plate and screw repair of a left maxillary fracture. Electronically Signed   By: Nelson Chimes M.D.   On: 06/10/2022 09:28   CT HEAD WO CONTRAST (5MM)  Result Date: 06/10/2022 CLINICAL DATA:  Fall with trauma to the head and face. Anticoagulated. EXAM: CT HEAD WITHOUT CONTRAST TECHNIQUE: Contiguous axial images were obtained from the base of the skull through the vertex without intravenous contrast. RADIATION DOSE REDUCTION: This exam was performed according to the departmental dose-optimization program which includes automated exposure control, adjustment of the mA and/or kV according to patient size  and/or use of iterative reconstruction technique. COMPARISON:  None Available. FINDINGS: Brain: Mild age related volume loss. No evidence of old or acute infarction, mass lesion, hemorrhage, hydrocephalus or extra-axial collection. Mild chronic small-vessel ischemic change of the cerebral  hemispheric white matter, thalami and basal ganglia. Few scattered punctate benign calcifications are nonspecific and not likely significant presently. Vascular: There is atherosclerotic calcification of the major vessels at the base of the brain. Skull: Negative Sinuses/Orbits: Clear/normal Other: None IMPRESSION: No acute or traumatic finding. Mild age related volume loss. Mild chronic small-vessel ischemic change of the white matter, thalami and basal ganglia. Electronically Signed   By: Nelson Chimes M.D.   On: 06/10/2022 09:16   CT Chest W Contrast  Result Date: 06/10/2022 CLINICAL DATA:  Blunt trauma to the chest after a fall. EXAM: CT CHEST WITH CONTRAST TECHNIQUE: Multidetector CT imaging of the chest was performed during intravenous contrast administration. RADIATION DOSE REDUCTION: This exam was performed according to the departmental dose-optimization program which includes automated exposure control, adjustment of the mA and/or kV according to patient size and/or use of iterative reconstruction technique. CONTRAST:  23m OMNIPAQUE IOHEXOL 350 MG/ML SOLN COMPARISON:  10/03/2021 FINDINGS: Cardiovascular: Heart size is normal. Previous median sternotomy and CABG. Aortic atherosclerosis without aneurysm or dissection. No pulmonary emboli. Mediastinum/Nodes: No mediastinal or hilar mass or lymphadenopathy. Small mediastinal nodes are stable. Lungs/Pleura: Background emphysema and pulmonary scarring. A previously seen right upper lobe density anteriorly has not enlarged since a parole and areas that were solid-appearing previously now show some aeration. These findings suggest that this is benign bronchial disease and pulmonary scarring. Overall maximal dimension remains approximately 1.5 cm. Additional continued follow-up would be suggested. Elsewhere, previously seen right middle lobe collapse has resolved, with mild residual patchy density that could be scarring or minimal right middle lobe pneumonia  presently. Fairly extensive patchy bronchopneumonia previously seen in the right lower lobe has improved. There is some persistent patchy density in that region with bronchial opacification that could be due to scarring, residual or recurrent right lower lobe bronchopneumonia. Follow-up to clearing would be recommended. No pleural effusion on either side. Upper Abdomen: No abdominal finding. Musculoskeletal: No spinal or rib fracture. IMPRESSION: 1. No acute traumatic finding. 2. Previous median sternotomy and CABG. Aortic atherosclerosis without aneurysm or dissection. 3. Background emphysema and pulmonary scarring. 4. Previously seen right middle lobe collapse has resolved, with mild residual patchy density that could be scarring or minimal right middle lobe pneumonia presently. 5. Fairly extensive patchy bronchopneumonia previously seen in the right lower lobe has improved. There is some persistent patchy density in that region with bronchial opacification that could be due to scarring, residual or recurrent right lower lobe bronchopneumonia. Follow-up to clearing would be recommended. 6. A previously seen right upper lobe density anteriorly has not enlarged and areas that were solid-appearing previously show some aeration. These findings suggest that this is benign bronchial disease and pulmonary scarring. Overall maximal dimension remains approximately 1.5 cm. Additional/continued follow-up would be suggested. Aortic Atherosclerosis (ICD10-I70.0) and Emphysema (ICD10-J43.9). Electronically Signed   By: MNelson ChimesM.D.   On: 06/10/2022 09:15   DG Pelvis Portable  Result Date: 06/10/2022 CLINICAL DATA:  87year old male status post fall. EXAM: PORTABLE PELVIS 1-2 VIEWS COMPARISON:  CT Abdomen and Pelvis 11/05/2017. FINDINGS: Portable AP semi upright view at 0835 hours. Femoral heads remain normally located. Left lateral iliac wing not completely included, but visible pelvis appears stable and intact.  Grossly intact proximal femurs. Bulky iliofemoral  calcified atherosclerosis. Negative visible bowel gas. IMPRESSION: 1. No acute fracture or dislocation identified about the pelvis. 2. Calcified atherosclerosis. Electronically Signed   By: Genevie Ann M.D.   On: 06/10/2022 08:50   DG CHEST PORT 1 VIEW  Result Date: 06/10/2022 CLINICAL DATA:  Fall EXAM: PORTABLE CHEST 1 VIEW COMPARISON:  02/21/2022 IMPRESSION: Hyperinflated lungs with pulmonary vascular congestion. Electronically Signed   By: Sammie Bench M.D.   On: 06/10/2022 08:49    Microbiology: Results for orders placed or performed during the hospital encounter of 06/10/22  Resp panel by RT-PCR (RSV, Flu A&B, Covid) Anterior Nasal Swab     Status: None   Collection Time: 06/10/22  8:43 AM   Specimen: Anterior Nasal Swab  Result Value Ref Range Status   SARS Coronavirus 2 by RT PCR NEGATIVE NEGATIVE Final    Comment: (NOTE) SARS-CoV-2 target nucleic acids are NOT DETECTED.  The SARS-CoV-2 RNA is generally detectable in upper respiratory specimens during the acute phase of infection. The lowest concentration of SARS-CoV-2 viral copies this assay can detect is 138 copies/mL. A negative result does not preclude SARS-Cov-2 infection and should not be used as the sole basis for treatment or other patient management decisions. A negative result may occur with  improper specimen collection/handling, submission of specimen other than nasopharyngeal swab, presence of viral mutation(s) within the areas targeted by this assay, and inadequate number of viral copies(<138 copies/mL). A negative result must be combined with clinical observations, patient history, and epidemiological information. The expected result is Negative.  Fact Sheet for Patients:  EntrepreneurPulse.com.au  Fact Sheet for Healthcare Providers:  IncredibleEmployment.be  This test is no t yet approved or cleared by the [place] FDA  and  has been authorized for detection and/or diagnosis of SARS-CoV-2 by FDA under an Emergency Use Authorization (EUA). This EUA will remain  in effect (meaning this test can be used) for the duration of the COVID-19 declaration under Section 564(b)(1) of the Act, 21 U.S.C.section 360bbb-3(b)(1), unless the authorization is terminated  or revoked sooner.       Influenza A by PCR NEGATIVE NEGATIVE Final   Influenza B by PCR NEGATIVE NEGATIVE Final    Comment: (NOTE) The Xpert Xpress SARS-CoV-2/FLU/RSV plus assay is intended as an aid in the diagnosis of influenza from Nasopharyngeal swab specimens and should not be used as a sole basis for treatment. Nasal washings and aspirates are unacceptable for Xpert Xpress SARS-CoV-2/FLU/RSV testing.  Fact Sheet for Patients: EntrepreneurPulse.com.au  Fact Sheet for Healthcare Providers: IncredibleEmployment.be  This test is not yet approved or cleared by the [place] FDA and has been authorized for detection and/or diagnosis of SARS-CoV-2 by FDA under an Emergency Use Authorization (EUA). This EUA will remain in effect (meaning this test can be used) for the duration of the COVID-19 declaration under Section 564(b)(1) of the Act, 21 U.S.C. section 360bbb-3(b)(1), unless the authorization is terminated or revoked.     Resp Syncytial Virus by PCR NEGATIVE NEGATIVE Final    Comment: (NOTE) Fact Sheet for Patients: EntrepreneurPulse.com.au  Fact Sheet for Healthcare Providers: IncredibleEmployment.be  This test is not yet approved or cleared by the [place] FDA and has been authorized for detection and/or diagnosis of SARS-CoV-2 by FDA under an Emergency Use Authorization (EUA). This EUA will remain in effect (meaning this test can be used) for the duration of the COVID-19 declaration under Section 564(b)(1) of the Act, 21 U.S.C. section 360bbb-3(b)(1),  unless the authorization is terminated or revoked.  Performed at [facility], [address], [place], [place] [address]     Labs: CBC: Recent Labs  Lab 06/10/22 [phone] 06/10/22 0843 06/11/22 0420 06/14/22 0404  WBC 13.0*  --  12.6* 8.3  HGB 13.1 13.3 10.9* 9.5*  HCT 39.9 39.0 33.7* 27.5*  MCV 89.7  --  89.6 85.4  PLT 151  --  135* 494   Basic Metabolic Panel: Recent Labs  Lab 06/10/22 0835 06/10/22 0843 06/11/22 0420 06/14/22 0404  NA 134* 136 134* 136  K 4.6 4.7 4.2 3.7  CL 100 103 106 105  CO2 18*  --  18* 23  GLUCOSE 261* 258* 155* 167*  BUN 33* 36* 35* 27*  CREATININE 2.05* 2.00* 1.60* 1.22  CALCIUM 8.8*  --  7.9* 8.1*  MG  --   --   --  1.7   Liver Function Tests: Recent Labs  Lab 06/10/22 0835  AST 36  ALT 17  ALKPHOS 52  BILITOT 0.9  PROT 8.1  ALBUMIN 3.2*   CBG: Recent Labs  Lab 06/13/22 1244 06/13/22 1608 06/13/22 2205 06/14/22 0801 06/14/22 1253  GLUCAP 167* 208* 220* 155* 116*    Discharge time spent: greater than 30 minutes.  Signed: Estill Cotta, MD [facility] 06/14/2022

## 2022-06-14 NOTE — Progress Notes (Signed)
[employer] delivered DMEs to room - portable O2 tank, BSC, and wheelchair.  Granddaughter refused portable O2 tank and BSC due to patient has those at home.  Patient discharged home.

## 2022-06-14 NOTE — Progress Notes (Signed)
With DC order in place.  Verify with Dr. Tana Coast if pt will be set for [facility] PT as recommended by PT.  Dr. Tana Coast requests [facility] PT/OT set up.  LCSW Percell Locus made aware.  Discharge pending.

## 2022-06-14 NOTE — Progress Notes (Signed)
Physical Therapy Treatment Patient Details Name: [name] MRN: [id number] DOB: [birth year] Today's Date: 06/14/2022   History of Present Illness [name] is a 87 yo male is presented on 06/10/22 after a fall with facial bleeding. Pt with complaints of L knee pain. Imaging demonstrated communicated nasal fractures, otherwise negative. PMHx: CAD, s/p CABG, COPD, (1ppd over 75 yrs), emphysema, history of pulmonary lesion and PNA, HTN (but meds stopped due to hypotension), CKD, and DM    PT Comments    Good progress towards acute functional goals. Family present and participated in treatment session today. Demonstrates improved functional capacity overall. Very light assist with transfers from low bed, ambulating >100 feet with supervision, no evidence of LOB while using RW for support. SpO2 at rest 95% on RA, while ambulating SpO2 90% majority of distance however after returning to bed dropped to 88% briefly before improving back into the 90s. Cues for pursed lip breathing and energy conservation. Adequate for d/c from PT standpoint with family assistance once medically ready. Will continue to follow and progress during admission.   Recommendations for follow up therapy are one component of a multi-disciplinary discharge planning process, led by the attending physician.  Recommendations may be updated based on patient status, additional functional criteria and insurance authorization.  Follow Up Recommendations  Home health PT     Assistance Recommended at Discharge Frequent or constant Supervision/Assistance  Patient can return home with the following Assistance with cooking/housework;Help with stairs or ramp for entrance;A little help with walking and/or transfers;A little help with bathing/dressing/bathroom;Assist for transportation   Equipment Recommendations  BSC/3in1;Wheelchair (measurements PT);Wheelchair cushion (measurements PT)    Recommendations for Other Services        Precautions / Restrictions Precautions Precautions: Fall Precaution Comments: extremely HOH Restrictions Weight Bearing Restrictions: No     Mobility  Bed Mobility Overal bed mobility: Needs Assistance Bed Mobility: Supine to Sit, Sit to Supine     Supine to sit: Supervision Sit to supine: Supervision   General bed mobility comments: Supervision for safety and to assist with multiple lines/leads, no physical assist needed.    Transfers Overall transfer level: Needs assistance Equipment used: Rolling walker (2 wheels) Transfers: Sit to/from Stand Sit to Stand: Min assist           General transfer comment: CGA assist, effortful to rise, minimal instability with use of RW for support. Cues for hand placement.    Ambulation/Gait Ambulation/Gait assistance: Supervision Gait Distance (Feet): 120 Feet Assistive device: Rolling walker (2 wheels) Gait Pattern/deviations: Step-through pattern, Decreased stride length, Narrow base of support Gait velocity: decreased Gait velocity interpretation: <1.8 ft/sec, indicate of risk for recurrent falls   General Gait Details: SpO2 90% on RA during ambulatory bout, 2-3/4 dyspnea. 2 standing rest breaks to complete distance. Supervision for safety, good RW control. Cues for wider BOS. No buckling or LOB noted with this AD today.   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        Exercises      General Comments General comments (skin integrity, edema, etc.): SpO2 at rest 95% on RA. Amb 90%. After returning to bed, dropped to 88% briefly but then returned to 90%. Cues for pursed lip breathing helped. Family present and supportive.      Pertinent Vitals/Pain Pain Assessment Pain Assessment: Faces Faces Pain Scale: Hurts a little bit Pain Location: Lt side, face Pain Descriptors / Indicators: Discomfort, Guarding Pain Intervention(s): Monitored during session, Repositioned    Home Living                          Prior Function            PT Goals (current goals can now be found in the care plan section) Acute Rehab PT Goals Patient Stated Goal: decrease pain; return home with 24 hr support PT Goal Formulation: With patient/family Time For Goal Achievement: 06/25/22 Potential to Achieve Goals: Good Progress towards PT goals: Progressing toward goals    Frequency    Min 3X/week      PT Plan Current plan remains appropriate    Co-evaluation              AM-PAC PT "6 Clicks" Mobility   Outcome Measure  Help needed turning from your back to your side while in a flat bed without using bedrails?: A Little Help needed moving from lying on your back to sitting on the side of a flat bed without using bedrails?: A Little Help needed moving to and from a bed to a chair (including a wheelchair)?: A Little Help needed standing up from a chair using your arms (e.g., wheelchair or bedside chair)?: A Little Help needed to walk in hospital room?: A Little Help needed climbing 3-5 steps with a railing? : A Lot 6 Click Score: 17    End of Session Equipment Utilized During Treatment: Gait belt Activity Tolerance: Patient tolerated treatment well Patient left: with family/visitor present;in bed;with bed alarm set   PT Visit Diagnosis: Other abnormalities of gait and mobility  (R26.89);Pain Pain - Right/Left: Left Pain - part of body: Knee     Time: 6712-4580 PT Time Calculation (min) (ACUTE ONLY): 18 min  Charges:  $Gait Training: 8-22 mins                     Candie Mile, PT, DPT Physical Therapist [facility] [place] 06/14/2022, 10:14 AM

## 2022-06-17 ENCOUNTER — Emergency Department: Payer: Medicare HMO

## 2022-06-17 ENCOUNTER — Inpatient Hospital Stay
Admission: EM | Admit: 2022-06-17 | Discharge: 2022-06-29 | DRG: 871 | Disposition: A | Discharge status: Hospice | Payer: Medicare HMO | Attending: Internal Medicine | Admitting: Internal Medicine

## 2022-06-17 ENCOUNTER — Encounter: Payer: Self-pay | Admitting: Emergency Medicine

## 2022-06-17 ENCOUNTER — Other Ambulatory Visit: Payer: Self-pay

## 2022-06-17 DIAGNOSIS — N179 Acute kidney failure, unspecified: Secondary | ICD-10-CM | POA: Diagnosis not present

## 2022-06-17 DIAGNOSIS — J440 Chronic obstructive pulmonary disease with acute lower respiratory infection: Secondary | ICD-10-CM | POA: Diagnosis not present

## 2022-06-17 DIAGNOSIS — I480 Paroxysmal atrial fibrillation: Secondary | ICD-10-CM | POA: Diagnosis present

## 2022-06-17 DIAGNOSIS — G9341 Metabolic encephalopathy: Secondary | ICD-10-CM | POA: Diagnosis not present

## 2022-06-17 DIAGNOSIS — F32A Depression, unspecified: Secondary | ICD-10-CM | POA: Diagnosis present

## 2022-06-17 DIAGNOSIS — R079 Chest pain, unspecified: Secondary | ICD-10-CM | POA: Diagnosis not present

## 2022-06-17 DIAGNOSIS — F419 Anxiety disorder, unspecified: Secondary | ICD-10-CM | POA: Diagnosis present

## 2022-06-17 DIAGNOSIS — R0600 Dyspnea, unspecified: Secondary | ICD-10-CM | POA: Diagnosis not present

## 2022-06-17 DIAGNOSIS — J449 Chronic obstructive pulmonary disease, unspecified: Secondary | ICD-10-CM | POA: Diagnosis not present

## 2022-06-17 DIAGNOSIS — J9621 Acute and chronic respiratory failure with hypoxia: Secondary | ICD-10-CM | POA: Diagnosis not present

## 2022-06-17 DIAGNOSIS — E1165 Type 2 diabetes mellitus with hyperglycemia: Secondary | ICD-10-CM | POA: Diagnosis present

## 2022-06-17 DIAGNOSIS — D62 Acute posthemorrhagic anemia: Secondary | ICD-10-CM | POA: Diagnosis not present

## 2022-06-17 DIAGNOSIS — Z515 Encounter for palliative care: Secondary | ICD-10-CM | POA: Diagnosis not present

## 2022-06-17 DIAGNOSIS — E785 Hyperlipidemia, unspecified: Secondary | ICD-10-CM | POA: Diagnosis present

## 2022-06-17 DIAGNOSIS — Z66 Do not resuscitate: Secondary | ICD-10-CM | POA: Diagnosis not present

## 2022-06-17 DIAGNOSIS — M545 Low back pain, unspecified: Secondary | ICD-10-CM | POA: Diagnosis present

## 2022-06-17 DIAGNOSIS — Z888 Allergy status to other drugs, medicaments and biological substances status: Secondary | ICD-10-CM

## 2022-06-17 DIAGNOSIS — I679 Cerebrovascular disease, unspecified: Secondary | ICD-10-CM | POA: Diagnosis present

## 2022-06-17 DIAGNOSIS — N1831 Chronic kidney disease, stage 3a: Secondary | ICD-10-CM | POA: Diagnosis present

## 2022-06-17 DIAGNOSIS — Z8673 Personal history of transient ischemic attack (TIA), and cerebral infarction without residual deficits: Secondary | ICD-10-CM

## 2022-06-17 DIAGNOSIS — E43 Unspecified severe protein-calorie malnutrition: Secondary | ICD-10-CM | POA: Diagnosis present

## 2022-06-17 DIAGNOSIS — F418 Other specified anxiety disorders: Secondary | ICD-10-CM | POA: Diagnosis present

## 2022-06-17 DIAGNOSIS — I10 Essential (primary) hypertension: Secondary | ICD-10-CM | POA: Diagnosis not present

## 2022-06-17 DIAGNOSIS — R911 Solitary pulmonary nodule: Secondary | ICD-10-CM | POA: Diagnosis present

## 2022-06-17 DIAGNOSIS — Z7982 Long term (current) use of aspirin: Secondary | ICD-10-CM

## 2022-06-17 DIAGNOSIS — Z7951 Long term (current) use of inhaled steroids: Secondary | ICD-10-CM

## 2022-06-17 DIAGNOSIS — S022XXA Fracture of nasal bones, initial encounter for closed fracture: Secondary | ICD-10-CM | POA: Diagnosis not present

## 2022-06-17 DIAGNOSIS — Z681 Body mass index (BMI) 19 or less, adult: Secondary | ICD-10-CM

## 2022-06-17 DIAGNOSIS — Z9981 Dependence on supplemental oxygen: Secondary | ICD-10-CM

## 2022-06-17 DIAGNOSIS — R0609 Other forms of dyspnea: Secondary | ICD-10-CM | POA: Diagnosis not present

## 2022-06-17 DIAGNOSIS — E1122 Type 2 diabetes mellitus with diabetic chronic kidney disease: Secondary | ICD-10-CM | POA: Diagnosis not present

## 2022-06-17 DIAGNOSIS — E11649 Type 2 diabetes mellitus with hypoglycemia without coma: Secondary | ICD-10-CM | POA: Diagnosis not present

## 2022-06-17 DIAGNOSIS — I130 Hypertensive heart and chronic kidney disease with heart failure and stage 1 through stage 4 chronic kidney disease, or unspecified chronic kidney disease: Secondary | ICD-10-CM | POA: Diagnosis not present

## 2022-06-17 DIAGNOSIS — I959 Hypotension, unspecified: Secondary | ICD-10-CM | POA: Diagnosis present

## 2022-06-17 DIAGNOSIS — J9601 Acute respiratory failure with hypoxia: Secondary | ICD-10-CM | POA: Diagnosis not present

## 2022-06-17 DIAGNOSIS — Y95 Nosocomial condition: Secondary | ICD-10-CM | POA: Diagnosis present

## 2022-06-17 DIAGNOSIS — R54 Age-related physical debility: Secondary | ICD-10-CM | POA: Diagnosis present

## 2022-06-17 DIAGNOSIS — I700 Atherosclerosis of aorta: Secondary | ICD-10-CM | POA: Diagnosis present

## 2022-06-17 DIAGNOSIS — H9190 Unspecified hearing loss, unspecified ear: Secondary | ICD-10-CM | POA: Diagnosis present

## 2022-06-17 DIAGNOSIS — R6520 Severe sepsis without septic shock: Secondary | ICD-10-CM | POA: Diagnosis present

## 2022-06-17 DIAGNOSIS — I350 Nonrheumatic aortic (valve) stenosis: Secondary | ICD-10-CM | POA: Diagnosis present

## 2022-06-17 DIAGNOSIS — I5032 Chronic diastolic (congestive) heart failure: Secondary | ICD-10-CM | POA: Diagnosis present

## 2022-06-17 DIAGNOSIS — R69 Illness, unspecified: Secondary | ICD-10-CM | POA: Diagnosis not present

## 2022-06-17 DIAGNOSIS — J690 Pneumonitis due to inhalation of food and vomit: Secondary | ICD-10-CM | POA: Diagnosis not present

## 2022-06-17 DIAGNOSIS — R059 Cough, unspecified: Secondary | ICD-10-CM | POA: Diagnosis not present

## 2022-06-17 DIAGNOSIS — Z794 Long term (current) use of insulin: Secondary | ICD-10-CM

## 2022-06-17 DIAGNOSIS — N400 Enlarged prostate without lower urinary tract symptoms: Secondary | ICD-10-CM | POA: Diagnosis present

## 2022-06-17 DIAGNOSIS — Z8249 Family history of ischemic heart disease and other diseases of the circulatory system: Secondary | ICD-10-CM

## 2022-06-17 DIAGNOSIS — A419 Sepsis, unspecified organism: Principal | ICD-10-CM | POA: Diagnosis present

## 2022-06-17 DIAGNOSIS — J189 Pneumonia, unspecified organism: Secondary | ICD-10-CM | POA: Diagnosis not present

## 2022-06-17 DIAGNOSIS — J439 Emphysema, unspecified: Secondary | ICD-10-CM | POA: Diagnosis not present

## 2022-06-17 DIAGNOSIS — Z8701 Personal history of pneumonia (recurrent): Secondary | ICD-10-CM

## 2022-06-17 DIAGNOSIS — N200 Calculus of kidney: Secondary | ICD-10-CM | POA: Diagnosis present

## 2022-06-17 DIAGNOSIS — M40204 Unspecified kyphosis, thoracic region: Secondary | ICD-10-CM | POA: Diagnosis not present

## 2022-06-17 DIAGNOSIS — J441 Chronic obstructive pulmonary disease with (acute) exacerbation: Secondary | ICD-10-CM | POA: Diagnosis not present

## 2022-06-17 DIAGNOSIS — E860 Dehydration: Secondary | ICD-10-CM | POA: Diagnosis present

## 2022-06-17 DIAGNOSIS — I2510 Atherosclerotic heart disease of native coronary artery without angina pectoris: Secondary | ICD-10-CM | POA: Diagnosis present

## 2022-06-17 DIAGNOSIS — E1129 Type 2 diabetes mellitus with other diabetic kidney complication: Secondary | ICD-10-CM | POA: Diagnosis present

## 2022-06-17 DIAGNOSIS — K219 Gastro-esophageal reflux disease without esophagitis: Secondary | ICD-10-CM | POA: Diagnosis present

## 2022-06-17 DIAGNOSIS — Z86718 Personal history of other venous thrombosis and embolism: Secondary | ICD-10-CM

## 2022-06-17 DIAGNOSIS — W19XXXA Unspecified fall, initial encounter: Secondary | ICD-10-CM | POA: Diagnosis not present

## 2022-06-17 DIAGNOSIS — Z1152 Encounter for screening for COVID-19: Secondary | ICD-10-CM | POA: Diagnosis not present

## 2022-06-17 DIAGNOSIS — Z91048 Other nonmedicinal substance allergy status: Secondary | ICD-10-CM

## 2022-06-17 DIAGNOSIS — J90 Pleural effusion, not elsewhere classified: Secondary | ICD-10-CM | POA: Diagnosis not present

## 2022-06-17 DIAGNOSIS — M549 Dorsalgia, unspecified: Secondary | ICD-10-CM | POA: Diagnosis present

## 2022-06-17 DIAGNOSIS — F17210 Nicotine dependence, cigarettes, uncomplicated: Secondary | ICD-10-CM | POA: Diagnosis present

## 2022-06-17 DIAGNOSIS — R0602 Shortness of breath: Secondary | ICD-10-CM | POA: Diagnosis not present

## 2022-06-17 DIAGNOSIS — M4316 Spondylolisthesis, lumbar region: Secondary | ICD-10-CM | POA: Diagnosis not present

## 2022-06-17 DIAGNOSIS — Z951 Presence of aortocoronary bypass graft: Secondary | ICD-10-CM

## 2022-06-17 DIAGNOSIS — Z79899 Other long term (current) drug therapy: Secondary | ICD-10-CM

## 2022-06-17 LAB — CBC WITH DIFFERENTIAL/PLATELET
Abs Immature Granulocytes: 0.09 10*3/uL — ABNORMAL HIGH (ref 0.00–0.07)
Basophils Absolute: 0 10*3/uL (ref 0.0–0.1)
Basophils Relative: 0 %
Eosinophils Absolute: 0.1 10*3/uL (ref 0.0–0.5)
Eosinophils Relative: 2 %
HCT: 33.4 % — ABNORMAL LOW (ref 39.0–52.0)
Hemoglobin: 10.7 g/dL — ABNORMAL LOW (ref 13.0–17.0)
Immature Granulocytes: 1 %
Lymphocytes Relative: 17 %
Lymphs Abs: 1.5 10*3/uL (ref 0.7–4.0)
MCH: 28.2 pg (ref 26.0–34.0)
MCHC: 32 g/dL (ref 30.0–36.0)
MCV: 87.9 fL (ref 80.0–100.0)
Monocytes Absolute: 1.5 10*3/uL — ABNORMAL HIGH (ref 0.1–1.0)
Monocytes Relative: 16 %
Neutro Abs: 6 10*3/uL (ref 1.7–7.7)
Neutrophils Relative %: 64 %
Platelets: 400 10*3/uL (ref 150–400)
RBC: 3.8 MIL/uL — ABNORMAL LOW (ref 4.22–5.81)
RDW: 16.9 % — ABNORMAL HIGH (ref 11.5–15.5)
WBC: 9.3 10*3/uL (ref 4.0–10.5)
nRBC: 0 % (ref 0.0–0.2)

## 2022-06-17 LAB — LACTIC ACID, PLASMA
Lactic Acid, Venous: 2 mmol/L (ref 0.5–1.9)
Lactic Acid, Venous: 2.4 mmol/L (ref 0.5–1.9)
Lactic Acid, Venous: 2 mmol/L (ref 0.5–1.9)
Lactic Acid, Venous: 1.6 mmol/L (ref 0.5–1.9)

## 2022-06-17 LAB — CBG MONITORING, ED
Glucose-Capillary: 137 mg/dL — ABNORMAL HIGH (ref 70–99)
Glucose-Capillary: 261 mg/dL — ABNORMAL HIGH (ref 70–99)
Glucose-Capillary: 352 mg/dL — ABNORMAL HIGH (ref 70–99)

## 2022-06-17 LAB — RESP PANEL BY RT-PCR (RSV, FLU A&B, COVID)  RVPGX2
Influenza A by PCR: NEGATIVE
Influenza B by PCR: NEGATIVE
Resp Syncytial Virus by PCR: NEGATIVE
SARS Coronavirus 2 by RT PCR: NEGATIVE

## 2022-06-17 LAB — URINALYSIS, ROUTINE W REFLEX MICROSCOPIC
Bilirubin Urine: NEGATIVE
Glucose, UA: NEGATIVE mg/dL
Hgb urine dipstick: NEGATIVE
Ketones, ur: NEGATIVE mg/dL
Leukocytes,Ua: NEGATIVE
Nitrite: NEGATIVE
Protein, ur: NEGATIVE mg/dL
Specific Gravity, Urine: 1.013 (ref 1.005–1.030)
pH: 5 (ref 5.0–8.0)

## 2022-06-17 LAB — COMPREHENSIVE METABOLIC PANEL
ALT: 23 U/L (ref 0–44)
AST: 35 U/L (ref 15–41)
Albumin: 2.9 g/dL — ABNORMAL LOW (ref 3.5–5.0)
Alkaline Phosphatase: 55 U/L (ref 38–126)
Anion gap: 13 (ref 5–15)
BUN: 31 mg/dL — ABNORMAL HIGH (ref 8–23)
CO2: 21 mmol/L — ABNORMAL LOW (ref 22–32)
Calcium: 8.8 mg/dL — ABNORMAL LOW (ref 8.9–10.3)
Chloride: 104 mmol/L (ref 98–111)
Creatinine, Ser: 1.37 mg/dL — ABNORMAL HIGH (ref 0.61–1.24)
GFR, Estimated: 51 mL/min — ABNORMAL LOW (ref >60)
Glucose, Bld: 154 mg/dL — ABNORMAL HIGH (ref 70–99)
Potassium: 4.1 mmol/L (ref 3.5–5.1)
Sodium: 138 mmol/L (ref 135–145)
Total Bilirubin: 1.2 mg/dL (ref 0.3–1.2)
Total Protein: 7.6 g/dL (ref 6.5–8.1)

## 2022-06-17 LAB — TROPONIN I (HIGH SENSITIVITY)
Troponin I (High Sensitivity): 13 ng/L (ref <18)
Troponin I (High Sensitivity): 12 ng/L (ref <18)

## 2022-06-17 LAB — MRSA NEXT GEN BY PCR, NASAL: MRSA by PCR Next Gen: NOT DETECTED

## 2022-06-17 LAB — BRAIN NATRIURETIC PEPTIDE: B Natriuretic Peptide: 172.2 pg/mL — ABNORMAL HIGH (ref 0.0–100.0)

## 2022-06-17 MED ORDER — IPRATROPIUM-ALBUTEROL 0.5-2.5 (3) MG/3ML IN SOLN
3.0000 mL | RESPIRATORY_TRACT | Status: DC
  Administered 2022-06-17 – 2022-06-19 (×12): 3 mL via RESPIRATORY_TRACT
  Filled 2022-06-17 (×13): qty 3

## 2022-06-17 MED ORDER — ACETAMINOPHEN 325 MG PO TABS: 650.0000 mg | ORAL_TABLET | Freq: Four times a day (QID) | ORAL | Status: DC | PRN

## 2022-06-17 MED ORDER — IPRATROPIUM-ALBUTEROL 0.5-2.5 (3) MG/3ML IN SOLN
3.0000 mL | Freq: Once | RESPIRATORY_TRACT | Status: AC
  Administered 2022-06-17: 3 mL via RESPIRATORY_TRACT
  Filled 2022-06-17: qty 3

## 2022-06-17 MED ORDER — ASPIRIN 325 MG PO TABS
325.0000 mg | ORAL_TABLET | Freq: Every day | ORAL | Status: DC
  Administered 2022-06-17 – 2022-06-27 (×11): 325 mg via ORAL
  Filled 2022-06-17 (×12): qty 1

## 2022-06-17 MED ORDER — INSULIN ASPART PROT & ASPART (70-30 MIX) 100 UNIT/ML ~~LOC~~ SUSP: 4.0000 [IU] | Freq: Every day | SUBCUTANEOUS | Status: DC

## 2022-06-17 MED ORDER — DM-GUAIFENESIN ER 30-600 MG PO TB12: 1.0000 | ORAL_TABLET | Freq: Two times a day (BID) | ORAL | Status: DC | PRN

## 2022-06-17 MED ORDER — PRAVASTATIN SODIUM 40 MG PO TABS
40.0000 mg | ORAL_TABLET | Freq: Every day | ORAL | Status: DC
  Administered 2022-06-18 – 2022-06-26 (×9): 40 mg via ORAL
  Filled 2022-06-17: qty 2
  Filled 2022-06-17 (×8): qty 1

## 2022-06-17 MED ORDER — LACTATED RINGERS IV SOLN: INTRAVENOUS | Status: AC

## 2022-06-17 MED ORDER — INSULIN ASPART 100 UNIT/ML IJ SOLN
0.0000 [IU] | Freq: Three times a day (TID) | INTRAMUSCULAR | Status: DC
  Administered 2022-06-17: 1 [IU] via SUBCUTANEOUS
  Administered 2022-06-18: 2 [IU] via SUBCUTANEOUS
  Administered 2022-06-18: 7 [IU] via SUBCUTANEOUS
  Filled 2022-06-17 (×3): qty 1

## 2022-06-17 MED ORDER — TAMSULOSIN HCL 0.4 MG PO CAPS
0.4000 mg | ORAL_CAPSULE | Freq: Every day | ORAL | Status: DC
  Administered 2022-06-18 – 2022-06-27 (×10): 0.4 mg via ORAL
  Filled 2022-06-17 (×10): qty 1

## 2022-06-17 MED ORDER — SODIUM CHLORIDE 0.9 % IV BOLUS
1000.0000 mL | Freq: Once | INTRAVENOUS | Status: AC
  Administered 2022-06-17: 1000 mL via INTRAVENOUS

## 2022-06-17 MED ORDER — INSULIN ASPART PROT & ASPART (70-30 MIX) 100 UNIT/ML ~~LOC~~ SUSP
6.0000 [IU] | Freq: Every day | SUBCUTANEOUS | Status: DC
  Administered 2022-06-17: 6 [IU] via SUBCUTANEOUS
  Filled 2022-06-17: qty 10

## 2022-06-17 MED ORDER — SODIUM CHLORIDE 0.9 % IV SOLN
2.0000 g | Freq: Once | INTRAVENOUS | Status: AC
  Administered 2022-06-17: 2 g via INTRAVENOUS

## 2022-06-17 MED ORDER — METHYLPREDNISOLONE SODIUM SUCC 125 MG IJ SOLR
80.0000 mg | Freq: Every day | INTRAMUSCULAR | Status: DC
  Administered 2022-06-17 – 2022-06-18 (×2): 80 mg via INTRAVENOUS
  Filled 2022-06-17 (×2): qty 2

## 2022-06-17 MED ORDER — ENSURE ENLIVE PO LIQD
237.0000 mL | Freq: Two times a day (BID) | ORAL | Status: DC
  Administered 2022-06-17 – 2022-06-20 (×5): 237 mL via ORAL

## 2022-06-17 MED ORDER — IOHEXOL 350 MG/ML SOLN
75.0000 mL | Freq: Once | INTRAVENOUS | Status: AC | PRN
  Administered 2022-06-17: 75 mL via INTRAVENOUS

## 2022-06-17 MED ORDER — HYDROXYZINE HCL 25 MG PO TABS
25.0000 mg | ORAL_TABLET | Freq: Three times a day (TID) | ORAL | Status: DC | PRN
  Administered 2022-06-26: 25 mg via ORAL
  Filled 2022-06-17: qty 1

## 2022-06-17 MED ORDER — VANCOMYCIN HCL 750 MG IV SOLR
750.0000 mg | INTRAVENOUS | Status: DC
  Filled 2022-06-17: qty 15

## 2022-06-17 MED ORDER — PANTOPRAZOLE SODIUM 40 MG PO TBEC
40.0000 mg | DELAYED_RELEASE_TABLET | Freq: Every day | ORAL | Status: DC
  Administered 2022-06-17 – 2022-06-27 (×11): 40 mg via ORAL
  Filled 2022-06-17 (×12): qty 1

## 2022-06-17 MED ORDER — NICOTINE 21 MG/24HR TD PT24
21.0000 mg | MEDICATED_PATCH | Freq: Every day | TRANSDERMAL | Status: DC
  Administered 2022-06-18: 21 mg via TRANSDERMAL
  Filled 2022-06-17 (×4): qty 1

## 2022-06-17 MED ORDER — OXYCODONE-ACETAMINOPHEN 5-325 MG PO TABS
1.0000 | ORAL_TABLET | Freq: Four times a day (QID) | ORAL | Status: DC | PRN
  Administered 2022-06-25 – 2022-06-26 (×2): 1 via ORAL
  Filled 2022-06-17 (×3): qty 1

## 2022-06-17 MED ORDER — NITROGLYCERIN 0.4 MG SL SUBL
0.4000 mg | SUBLINGUAL_TABLET | SUBLINGUAL | Status: DC | PRN
  Administered 2022-06-19 – 2022-06-20 (×2): 0.4 mg via SUBLINGUAL
  Filled 2022-06-17 (×2): qty 1

## 2022-06-17 MED ORDER — METHOCARBAMOL 500 MG PO TABS
500.0000 mg | ORAL_TABLET | Freq: Three times a day (TID) | ORAL | Status: DC | PRN
  Administered 2022-06-26: 500 mg via ORAL
  Filled 2022-06-17: qty 1

## 2022-06-17 MED ORDER — METRONIDAZOLE 500 MG/100ML IV SOLN
500.0000 mg | Freq: Two times a day (BID) | INTRAVENOUS | Status: AC
  Administered 2022-06-17 – 2022-06-23 (×13): 500 mg via INTRAVENOUS
  Filled 2022-06-17 (×13): qty 100

## 2022-06-17 MED ORDER — INSULIN ASPART 100 UNIT/ML IJ SOLN
0.0000 [IU] | Freq: Every day | INTRAMUSCULAR | Status: DC
  Administered 2022-06-17: 5 [IU] via SUBCUTANEOUS
  Filled 2022-06-17: qty 1

## 2022-06-17 MED ORDER — VANCOMYCIN HCL IN DEXTROSE 1-5 GM/200ML-% IV SOLN
1000.0000 mg | Freq: Once | INTRAVENOUS | Status: AC
  Administered 2022-06-17: 1000 mg via INTRAVENOUS
  Filled 2022-06-17: qty 200

## 2022-06-17 MED ORDER — ONDANSETRON HCL 4 MG/2ML IJ SOLN: 4.0000 mg | Freq: Three times a day (TID) | INTRAMUSCULAR | Status: DC | PRN

## 2022-06-17 MED ORDER — LACTATED RINGERS IV BOLUS (SEPSIS)
1000.0000 mL | Freq: Once | INTRAVENOUS | Status: DC
  Administered 2022-06-17: 1000 mL via INTRAVENOUS

## 2022-06-17 MED ORDER — LACTATED RINGERS IV BOLUS (SEPSIS)
1000.0000 mL | Freq: Once | INTRAVENOUS | Status: AC
  Administered 2022-06-17: 1000 mL via INTRAVENOUS

## 2022-06-17 MED ORDER — ALBUTEROL SULFATE (2.5 MG/3ML) 0.083% IN NEBU
2.5000 mg | INHALATION_SOLUTION | RESPIRATORY_TRACT | Status: DC | PRN
  Administered 2022-06-19 – 2022-06-27 (×4): 2.5 mg via RESPIRATORY_TRACT
  Filled 2022-06-17 (×5): qty 3

## 2022-06-17 MED ORDER — ENOXAPARIN SODIUM 40 MG/0.4ML IJ SOSY
40.0000 mg | PREFILLED_SYRINGE | INTRAMUSCULAR | Status: DC
  Administered 2022-06-17 – 2022-06-22 (×6): 40 mg via SUBCUTANEOUS
  Filled 2022-06-17 (×6): qty 0.4

## 2022-06-17 MED ORDER — SODIUM CHLORIDE 0.9 % IV SOLN
2.0000 g | Freq: Two times a day (BID) | INTRAVENOUS | Status: DC
  Administered 2022-06-17 – 2022-06-22 (×11): 2 g via INTRAVENOUS
  Filled 2022-06-17 (×2): qty 2
  Filled 2022-06-17 (×2): qty 12.5
  Filled 2022-06-17: qty 2
  Filled 2022-06-17: qty 12.5
  Filled 2022-06-17: qty 2
  Filled 2022-06-17 (×4): qty 12.5
  Filled 2022-06-17: qty 2

## 2022-06-17 MED ORDER — SODIUM CHLORIDE 0.9 % IV SOLN: INTRAVENOUS | Status: DC

## 2022-06-17 MED ORDER — SODIUM CHLORIDE 0.9 % IV SOLN: Freq: Once | INTRAVENOUS | Status: AC

## 2022-06-17 NOTE — Sepsis Progress Note (Signed)
Notified provider of need to order repeat lactic acid. ° °

## 2022-06-17 NOTE — H&P (Signed)
History and Physical    [name] ZLD:[id number] DOB: 07-09-1934 DOA: 06/17/2022  Referring MD/NP/PA:   PCP: Susy Frizzle, MD   Patient coming from:  The patient is coming from home.    Chief Complaint: SOB   HPI: [name] is a 87 y.o. male with medical history significant of hypertension, hyperlipidemia, diabetes mellitus, COPD, severe AS, depression with anxiety, tobacco abuse, A flutter/A-fib, CKD-3A, DVT, aspiration pneumonia, BPH, who presents with shortness breath.  Patient was recently hospitalized from 5/18 - 5/22 due to fall and pneumonia. Patient is on 4Lof oxygen at night. Pt has progressively worsening shortness breath.  Patient has a dry cough no chest pain, no fever or chills.  Patient had a diarrhea on Saturday, which has resolved.  Currently no diarrhea.  Patient has nausea no vomiting or abdominal pain.  Denies symptoms of UTI.  Patient also has generalized weakness and lower back pain.  Patient is alert, oriented x 3.  No confusion.  Initially patient was hypotensive with blood pressure 81/43, which improved to 132/60 after giving 2 L IV fluid in ED.  Data reviewed independently and ED Course: pt was found to have WBC 9.3, negative COVID PCR, negative flu PCR and RSV, BMP 172, troponin 13, lactic acid 2.0, 2.4, renal function close to baseline, temperature 97.6, heart rate of 108, RR 40, dissection 70s, which improved to 100% on 3 L oxygen.  Chest x-ray showed persistent right lower lobe infiltration.  Patient is admitted to PCU as inpatient.  CTA of chest: 1. No evidence of pulmonary embolus. 2. New and progressive airspace disease within the lung bases as above, consistent with worsening infection or aspiration. 3. Increased opacification of the left lower lobe bronchi which may reflect mucoid impaction versus aspiration. 4. Subcentimeter reactive mediastinal and hilar lymph nodes. 5. Aortic Atherosclerosis (ICD10-I70.0) and Emphysema  (ICD10-J43.9)   CT of T-spine  1. No CT evidence for acute osseous abnormality within the thoracic spine. 2. Right lower lobe consolidative opacity, which could reflect changes of infectious or aspiration pneumonitis.   Aortic Atherosclerosis (ICD10-I70.0) and Emphysema (ICD10-J43.9  CT of L-spine 1. No acute osseous abnormality within the lumbar spine spine. 2. Bilateral nonobstructive nephrolithiasis.   Aortic Atherosclerosis (ICD10-I70.0).  EKG: I have personally reviewed.  Sinus rhythm, QTc 433, poor R wave progression  Review of Systems:   General: no fevers, chills, no body weight gain, has poor appetite, has fatigue HEENT: no blurry vision, hearing changes or sore throat Respiratory: has dyspnea, coughing, wheezing CV: no chest pain, no palpitations GI: no nausea, vomiting, abdominal pain, diarrhea, constipation GU: no dysuria, burning on urination, increased urinary frequency, hematuria  Ext: no leg edema Neuro: no unilateral weakness, numbness, or tingling, no vision change or hearing loss Skin: no rash, no skin tear. MSK: No muscle spasm, no deformity, no limitation of range of movement in spin. Has back pain Heme: No easy bruising.  Travel history: No recent long distant travel.   Allergy:  Allergies  Allergen Reactions   Celebrex [Celecoxib] Rash and Other (See Comments)    Blisters, also   Tape Other (See Comments)    EKG leads leave red marks and PLEASE USE COBAN WRAP!! Skin tears and bruises easily    Past Medical History:  Diagnosis Date   Acute venous embolism and thrombosis of unspecified deep vessels of lower extremity    Atrial flutter (HCC)    and SVT that is post ablation   CAD (coronary artery disease)  Chronic kidney disease    stones   Coronary atherosclerosis 11/19/2008   Qualifier: Diagnosis of  By: Stanford Breed, MD, Kandyce Rud    Dyslipidemia    GERD (gastroesophageal reflux disease)    HTN (hypertension)    Shortness of  breath    on exertion    Past Surgical History:  Procedure Laterality Date   CARDIAC CATHETERIZATION     s/p cardiac catheterization and bypass surgery as well as EGD with dilation x3, left lower extremity vein stripping   CORONARY ARTERY BYPASS GRAFT     ESOPHAGOGASTRODUODENOSCOPY N/A 12/06/2013   Procedure: ESOPHAGOGASTRODUODENOSCOPY (EGD);  Surgeon: Lear Ng, MD;  Location: [facility];  Service: Endoscopy;  Laterality: N/A;   left knee anthroscopy     SECONDARY CLOSURE ARM     VIDEO BRONCHOSCOPY Bilateral 01/14/2018   Procedure: VIDEO BRONCHOSCOPY WITHOUT FLUORO;  Surgeon: Tanda Rockers, MD;  Location: [facility];  Service: Cardiopulmonary;  Laterality: Bilateral;    Social History:  reports that he has been smoking cigarettes. He has a 60.00 pack-year smoking history. He has never used smokeless tobacco. He reports that he does not drink alcohol and does not use drugs.  Family History:  Family History  Problem Relation Age of Onset   Heart attack Mother    Heart attack Father      Prior to Admission medications   Medication Sig Start Date End Date Taking? Authorizing Provider  acetaminophen (TYLENOL) 325 MG tablet Take 325-650 mg by mouth every 6 (six) hours as needed for mild pain or headache.    [provider]  albuterol (PROVENTIL) (2.5 MG/3ML) 0.083% nebulizer solution Take 3 mLs (2.5 mg total) by nebulization every 6 (six) hours as needed for wheezing or shortness of breath. 10/08/21   Ghimire, Henreitta Leber, MD  aspirin 325 MG tablet Take 325 mg by mouth daily.    [provider]  blood glucose meter kit and supplies Dispense based on patient and insurance preference. Use up to four times daily as directed. (FOR ICD-10 E10.9, E11.9). 08/18/20   Susy Frizzle, MD  budesonide-formoterol (SYMBICORT) 160-4.5 MCG/ACT inhaler Inhale 2 puffs into the lungs 2 (two) times daily. 06/14/22   Rai, Vernelle Emerald, MD  clobetasol cream (TEMOVATE) 0.05 % Please  specify directions, refills and quantity Patient taking differently: Apply 1 Application topically daily as needed (rash). 08/27/18   Susy Frizzle, MD  clotrimazole (LOTRIMIN) 1 % cream Apply 1 application topically 2 (two) times daily. Apply to the head of the penis Patient taking differently: Apply 1 application  topically 2 (two) times daily as needed (rash). Apply to the head of the penis 07/03/20   Jenna Luo T, MD  glucose blood (ONETOUCH ULTRA) test strip USE TO CHECK BLOOD SUGAR 4 TO 6 TIMES DAILY. E11.9 02/18/22   Susy Frizzle, MD  guaiFENesin (ROBITUSSIN) 100 MG/5ML liquid Take 10 mLs by mouth every 6 (six) hours as needed for cough or to loosen phlegm. 06/14/22   Rai, Vernelle Emerald, MD  HYDROcodone-acetaminophen (NORCO) 5-325 MG tablet Take 1 tablet by mouth every 6 (six) hours as needed for moderate pain. 01/07/22   Susy Frizzle, MD  hydrOXYzine (ATARAX) 25 MG tablet Take 1 tablet (25 mg total) by mouth 3 (three) times daily as needed for itching. 04/26/22   Susy Frizzle, MD  Insulin Syringe-Needle U-100 (BD INSULIN SYRINGE U/F) 31G X 5/16" 0.3 ML MISC USE WITH NOVOLIN TWICE DAY WITH A MEAL 03/15/20  Susy Frizzle, MD  nitroGLYCERIN (NITROSTAT) 0.4 MG SL tablet Place 0.4 mg under the tongue every 5 (five) minutes as needed for chest pain. 11/14/11   Lelon Perla, MD  NOVOLIN 70/30 (70-30) 100 UNIT/ML injection INJECT 8 UNITS SQ IN THE MORNING AND INJECT 12 UNITS SQ AT BEDTIME Patient taking differently: 8-12 Units See admin instructions. Inject 8 units into the skin in the morning and 12 units at bedtime 05/22/21   Susy Frizzle, MD  OneTouch Delica Lancets 72Z MISC USE TO CHECK BLOOD SUAGR 3 TIMES A DAY DX:E11.9 02/19/22   Susy Frizzle, MD  OXYGEN Inhale 4 L/min into the lungs at bedtime.    [provider]  pantoprazole (PROTONIX) 40 MG tablet TAKE 1 TABLET BY MOUTH EVERY DAY Patient taking differently: Take 40 mg by mouth daily. 01/10/22    Susy Frizzle, MD  pravastatin (PRAVACHOL) 40 MG tablet Take 1 tablet (40 mg total) by mouth daily. Patient taking differently: Take 40 mg by mouth at bedtime. 09/13/21   Susy Frizzle, MD  tamsulosin (FLOMAX) 0.4 MG CAPS capsule TAKE 1 CAPSULE BY MOUTH EVERY DAY Patient taking differently: Take 0.4 mg by mouth daily. 04/15/22   Susy Frizzle, MD  triamcinolone cream (KENALOG) 0.1 % APPLY TO AFFECTED AREA TWICE A DAY Patient taking differently: Apply 1 Application topically 2 (two) times daily as needed (rash, itching). 02/21/22   Susy Frizzle, MD    Physical Exam: Vitals:   06/17/22 1400 06/17/22 1700 06/17/22 1730 06/17/22 1800  BP: 132/60 (!) 148/64 (!) 146/68   Pulse: 67   81  Resp:      Temp:      TempSrc:      SpO2: 100%   100%  Weight:      Height:       General: Not in acute distress HEENT:       Eyes: PERRL, EOMI, no scleral icterus.       ENT: No discharge from the ears and nose, no pharynx injection, no tonsillar enlargement.        Neck: No JVD, no bruit, no mass felt. Heme: No neck lymph node enlargement. Cardiac: S1/S2, RRR, No murmurs, No gallops or rubs. Respiratory: has wheezing bilaterally GI: Soft, nondistended, nontender, no rebound pain, no organomegaly, BS present. GU: No hematuria Ext: No pitting leg edema bilaterally. 1+DP/PT pulse bilaterally. Musculoskeletal: No joint deformities, No joint redness or warmth, no limitation of ROM in spin. Skin: No rashes.  Neuro: Alert, oriented X3, cranial nerves II-XII grossly intact, moves all extremities normally.  Has lower back tenderness Psych: Patient is not psychotic, no suicidal or hemocidal ideation.  Labs on Admission: I have personally reviewed following labs and imaging studies  CBC: Recent Labs  Lab 06/11/22 0420 06/14/22 0404 06/17/22 1043  WBC 12.6* 8.3 9.3  NEUTROABS  --   --  6.0  HGB 10.9* 9.5* 10.7*  HCT 33.7* 27.5* 33.4*  MCV 89.6 85.4 87.9  PLT 135* 155 366   Basic  Metabolic Panel: Recent Labs  Lab 06/11/22 0420 06/14/22 0404 06/17/22 1043  NA 134* 136 138  K 4.2 3.7 4.1  CL 106 105 104  CO2 18* 23 21*  GLUCOSE 155* 167* 154*  BUN 35* 27* 31*  CREATININE 1.60* 1.22 1.37*  CALCIUM 7.9* 8.1* 8.8*  MG  --  1.7  --    GFR: Estimated Creatinine Clearance: 32.5 mL/min (A) (by C-G formula based on SCr of 1.37  mg/dL (H)). Liver Function Tests: Recent Labs  Lab 06/17/22 1043  AST 35  ALT 23  ALKPHOS 55  BILITOT 1.2  PROT 7.6  ALBUMIN 2.9*   No results for input(s): "LIPASE", "AMYLASE" in the last 168 hours. No results for input(s): "AMMONIA" in the last 168 hours. Coagulation Profile: No results for input(s): "INR", "PROTIME" in the last 168 hours. Cardiac Enzymes: Recent Labs  Lab 06/14/22 0404  CKTOTAL 70   BNP (last 3 results) No results for input(s): "PROBNP" in the last 8760 hours. HbA1C: No results for input(s): "HGBA1C" in the last 72 hours. CBG: Recent Labs  Lab 06/13/22 1608 06/13/22 2205 06/14/22 0801 06/14/22 1253 06/17/22 1620  GLUCAP 208* 220* 155* 116* 137*   Lipid Profile: No results for input(s): "CHOL", "HDL", "LDLCALC", "TRIG", "CHOLHDL", "LDLDIRECT" in the last 72 hours. Thyroid Function Tests: No results for input(s): "TSH", "T4TOTAL", "FREET4", "T3FREE", "THYROIDAB" in the last 72 hours. Anemia Panel: No results for input(s): "VITAMINB12", "FOLATE", "FERRITIN", "TIBC", "IRON", "RETICCTPCT" in the last 72 hours. Urine analysis:    Component Value Date/Time   COLORURINE YELLOW (A) 06/17/2022 1043   APPEARANCEUR CLEAR (A) 06/17/2022 1043   LABSPEC 1.013 06/17/2022 1043   PHURINE 5.0 06/17/2022 1043   GLUCOSEU NEGATIVE 06/17/2022 1043   HGBUR NEGATIVE 06/17/2022 1043   BILIRUBINUR NEGATIVE 06/17/2022 1043   KETONESUR NEGATIVE 06/17/2022 1043   PROTEINUR NEGATIVE 06/17/2022 1043   UROBILINOGEN 0.2 09/07/2014 0917   NITRITE NEGATIVE 06/17/2022 1043   LEUKOCYTESUR NEGATIVE 06/17/2022 1043   Sepsis  Labs: _0 (procalcitonin:4,lacticidven:4) ) Recent Results (from the past 240 hour(s))  Resp panel by RT-PCR (RSV, Flu A&B, Covid) Anterior Nasal Swab     Status: None   Collection Time: 06/10/22  8:43 AM   Specimen: Anterior Nasal Swab  Result Value Ref Range Status   SARS Coronavirus 2 by RT PCR NEGATIVE NEGATIVE Final    Comment: (NOTE) SARS-CoV-2 target nucleic acids are NOT DETECTED.  The SARS-CoV-2 RNA is generally detectable in upper respiratory specimens during the acute phase of infection. The lowest concentration of SARS-CoV-2 viral copies this assay can detect is 138 copies/mL. A negative result does not preclude SARS-Cov-2 infection and should not be used as the sole basis for treatment or other patient management decisions. A negative result may occur with  improper specimen collection/handling, submission of specimen other than nasopharyngeal swab, presence of viral mutation(s) within the areas targeted by this assay, and inadequate number of viral copies(<138 copies/mL). A negative result must be combined with clinical observations, patient history, and epidemiological information. The expected result is Negative.  Fact Sheet for Patients:  EntrepreneurPulse.com.au  Fact Sheet for Healthcare Providers:  IncredibleEmployment.be  This test is no t yet approved or cleared by the [place] FDA and  has been authorized for detection and/or diagnosis of SARS-CoV-2 by FDA under an Emergency Use Authorization (EUA). This EUA will remain  in effect (meaning this test can be used) for the duration of the COVID-19 declaration under Section 564(b)(1) of the Act, 21 U.S.C.section 360bbb-3(b)(1), unless the authorization is terminated  or revoked sooner.       Influenza A by PCR NEGATIVE NEGATIVE Final   Influenza B by PCR NEGATIVE NEGATIVE Final    Comment: (NOTE) The Xpert Xpress SARS-CoV-2/FLU/RSV plus assay is intended as  an aid in the diagnosis of influenza from Nasopharyngeal swab specimens and should not be used as a sole basis for treatment. Nasal washings and aspirates are unacceptable for Xpert Xpress SARS-CoV-2/FLU/RSV  testing.  Fact Sheet for Patients: EntrepreneurPulse.com.au  Fact Sheet for Healthcare Providers: IncredibleEmployment.be  This test is not yet approved or cleared by the [place] FDA and has been authorized for detection and/or diagnosis of SARS-CoV-2 by FDA under an Emergency Use Authorization (EUA). This EUA will remain in effect (meaning this test can be used) for the duration of the COVID-19 declaration under Section 564(b)(1) of the Act, 21 U.S.C. section 360bbb-3(b)(1), unless the authorization is terminated or revoked.     Resp Syncytial Virus by PCR NEGATIVE NEGATIVE Final    Comment: (NOTE) Fact Sheet for Patients: EntrepreneurPulse.com.au  Fact Sheet for Healthcare Providers: IncredibleEmployment.be  This test is not yet approved or cleared by the [place] FDA and has been authorized for detection and/or diagnosis of SARS-CoV-2 by FDA under an Emergency Use Authorization (EUA). This EUA will remain in effect (meaning this test can be used) for the duration of the COVID-19 declaration under Section 564(b)(1) of the Act, 21 U.S.C. section 360bbb-3(b)(1), unless the authorization is terminated or revoked.  Performed at [facility], [place] [address], [place],  [address]   Resp panel by RT-PCR (RSV, Flu A&B, Covid) Anterior Nasal Swab     Status: None   Collection Time: 06/17/22 10:40 AM   Specimen: Anterior Nasal Swab  Result Value Ref Range Status   SARS Coronavirus 2 by RT PCR NEGATIVE NEGATIVE Final    Comment: (NOTE) SARS-CoV-2 target nucleic acids are NOT DETECTED.  The SARS-CoV-2 RNA is generally detectable in upper respiratory specimens during the acute phase  of infection. The lowest concentration of SARS-CoV-2 viral copies this assay can detect is 138 copies/mL. A negative result does not preclude SARS-Cov-2 infection and should not be used as the sole basis for treatment or other patient management decisions. A negative result may occur with  improper specimen collection/handling, submission of specimen other than nasopharyngeal swab, presence of viral mutation(s) within the areas targeted by this assay, and inadequate number of viral copies(<138 copies/mL). A negative result must be combined with clinical observations, patient history, and epidemiological information. The expected result is Negative.  Fact Sheet for Patients:  EntrepreneurPulse.com.au  Fact Sheet for Healthcare Providers:  IncredibleEmployment.be  This test is no t yet approved or cleared by the [place] FDA and  has been authorized for detection and/or diagnosis of SARS-CoV-2 by FDA under an Emergency Use Authorization (EUA). This EUA will remain  in effect (meaning this test can be used) for the duration of the COVID-19 declaration under Section 564(b)(1) of the Act, 21 U.S.C.section 360bbb-3(b)(1), unless the authorization is terminated  or revoked sooner.       Influenza A by PCR NEGATIVE NEGATIVE Final   Influenza B by PCR NEGATIVE NEGATIVE Final    Comment: (NOTE) The Xpert Xpress SARS-CoV-2/FLU/RSV plus assay is intended as an aid in the diagnosis of influenza from Nasopharyngeal swab specimens and should not be used as a sole basis for treatment. Nasal washings and aspirates are unacceptable for Xpert Xpress SARS-CoV-2/FLU/RSV testing.  Fact Sheet for Patients: EntrepreneurPulse.com.au  Fact Sheet for Healthcare Providers: IncredibleEmployment.be  This test is not yet approved or cleared by the [place] FDA and has been authorized for detection and/or diagnosis of  SARS-CoV-2 by FDA under an Emergency Use Authorization (EUA). This EUA will remain in effect (meaning this test can be used) for the duration of the COVID-19 declaration under Section 564(b)(1) of the Act, 21 U.S.C. section 360bbb-3(b)(1), unless the authorization is terminated  or revoked.     Resp Syncytial Virus by PCR NEGATIVE NEGATIVE Final    Comment: (NOTE) Fact Sheet for Patients: EntrepreneurPulse.com.au  Fact Sheet for Healthcare Providers: IncredibleEmployment.be  This test is not yet approved or cleared by the [place] FDA and has been authorized for detection and/or diagnosis of SARS-CoV-2 by FDA under an Emergency Use Authorization (EUA). This EUA will remain in effect (meaning this test can be used) for the duration of the COVID-19 declaration under Section 564(b)(1) of the Act, 21 U.S.C. section 360bbb-3(b)(1), unless the authorization is terminated or revoked.  Performed at [facility], [address] [place] , [place] [address]      Radiological Exams on Admission: CT T-SPINE NO CHARGE  Result Date: 06/17/2022 CLINICAL DATA:  Initial evaluation for acute trauma, back pain. EXAM: CT THORACIC SPINE WITHOUT CONTRAST TECHNIQUE: Multidetector CT images of the thoracic were obtained using the standard protocol without intravenous contrast. RADIATION DOSE REDUCTION: This exam was performed according to the departmental dose-optimization program which includes automated exposure control, adjustment of the mA and/or kV according to patient size and/or use of iterative reconstruction technique. COMPARISON:  Concomitant CT of the chest. FINDINGS: Alignment: Physiologic with preservation of the normal thoracic kyphosis. No listhesis. Vertebrae: Vertebral body height maintained without acute or chronic fracture. Visualized ribs intact. No worrisome osseous lesions. Paraspinal and other soft tissues: Paraspinous soft tissues  demonstrate no acute finding. Right lower lobe consolidative opacity, which could reflect changes of infectious or aspiration pneumonitis. Aortic atherosclerosis. Emphysema noted. Disc levels: No significant disc pathology or stenosis evident by CT. Mild for age multilevel degenerative endplate spurring. IMPRESSION: 1. No CT evidence for acute osseous abnormality within the thoracic spine. 2. Right lower lobe consolidative opacity, which could reflect changes of infectious or aspiration pneumonitis. Aortic Atherosclerosis (ICD10-I70.0) and Emphysema (ICD10-J43.9). Electronically Signed   By: Jeannine Boga M.D.   On: 06/17/2022 17:59   CT Lumbar Spine Wo Contrast  Result Date: 06/17/2022 CLINICAL DATA:  Initial evaluation for acute trauma. EXAM: CT LUMBAR SPINE WITHOUT CONTRAST TECHNIQUE: Multidetector CT imaging of the lumbar spine was performed without intravenous contrast administration. Multiplanar CT image reconstructions were also generated. RADIATION DOSE REDUCTION: This exam was performed according to the departmental dose-optimization program which includes automated exposure control, adjustment of the mA and/or kV according to patient size and/or use of iterative reconstruction technique. COMPARISON:  None Available. FINDINGS: Segmentation: Standard. Lowest well-formed disc space labeled the L5-S1 level. Alignment: Trace degenerative anterolisthesis of L4 on L5. Alignment otherwise normal preservation of the normal lumbar lordosis. Vertebrae: Vertebral body height well maintained without acute or chronic fracture. Visualized sacrum and pelvis intact. SI joints symmetric and normal. No discrete or worrisome osseous lesions. Paraspinal and other soft tissues: Paraspinous soft tissues demonstrate no acute finding. Bilateral nonobstructive nephrolithiasis. Advanced aorto bi-iliac atherosclerotic disease noted. Disc levels: Mild for age noncompressive disc bulging noted within the lumbar spine.  Mild-to-moderate multilevel facet hypertrophy. No significant spinal stenosis by CT. IMPRESSION: 1. No acute osseous abnormality within the lumbar spine spine. 2. Bilateral nonobstructive nephrolithiasis. Aortic Atherosclerosis (ICD10-I70.0). Electronically Signed   By: Jeannine Boga M.D.   On: 06/17/2022 17:51   CT Angio Chest PE W and/or Wo Contrast  Result Date: 06/17/2022 CLINICAL DATA:  Cough and congestion, back pain, tobacco abuse EXAM: CT ANGIOGRAPHY CHEST WITH CONTRAST TECHNIQUE: Multidetector CT imaging of the chest was performed using the standard protocol during bolus administration of intravenous contrast. Multiplanar CT image reconstructions and MIPs were  obtained to evaluate the vascular anatomy. RADIATION DOSE REDUCTION: This exam was performed according to the departmental dose-optimization program which includes automated exposure control, adjustment of the mA and/or kV according to patient size and/or use of iterative reconstruction technique. CONTRAST:  74m OMNIPAQUE IOHEXOL 350 MG/ML SOLN COMPARISON:  06/17/2022, 06/10/2022 FINDINGS: Cardiovascular: This is a technically adequate evaluation of the pulmonary vasculature. No filling defects or pulmonary emboli. Postsurgical changes from CABG. No evidence of thoracic aortic aneurysm or dissection. Aortic atherosclerosis unchanged. Mediastinum/Nodes: Subcentimeter mediastinal and hilar lymph nodes are likely reactive. Thyroid, trachea, and esophagus are unremarkable. Lungs/Pleura: There is progressive airspace disease within the dependent right lower lobe, with new patchy airspace disease seen within the right middle lobe. Findings are consistent with worsening infection or aspiration. Progressive opacification of the left lower lobe bronchi is noted, which may reflect mucoid impaction or aspiration. Scattered tree in bud nodular airspace disease within the left lower lobe is new since prior exam. Extensive emphysema again noted. Small  cavitary region within the right apex reference image 29/5 likely reflect sequela from previous infection, and is stable. No effusion or pneumothorax. Upper Abdomen: No acute abnormality. Musculoskeletal: There are no acute or destructive bony lesions. Reconstructed images demonstrate no additional findings. Review of the MIP images confirms the above findings. IMPRESSION: 1. No evidence of pulmonary embolus. 2. New and progressive airspace disease within the lung bases as above, consistent with worsening infection or aspiration. 3. Increased opacification of the left lower lobe bronchi which may reflect mucoid impaction versus aspiration. 4. Subcentimeter reactive mediastinal and hilar lymph nodes. 5. Aortic Atherosclerosis (ICD10-I70.0) and Emphysema (ICD10-J43.9). Electronically Signed   By: MRanda NgoM.D.   On: 06/17/2022 15:30   DG Chest 2 View  Result Date: 06/17/2022 CLINICAL DATA:  sob EXAM: CHEST - 2 VIEW COMPARISON:  June 10, 2022 FINDINGS: The cardiomediastinal silhouette is unchanged in contour.Status post median sternotomy and CABG. No pleural effusion. No pneumothorax. Emphysematous changes. Persistent RIGHT lower lobe airspace opacities. This is similar comparison to prior. IMPRESSION: Persistent RIGHT lower lobe airspace opacities. This is favored to reflect infection. Electronically Signed   By: SValentino SaxonM.D.   On: 06/17/2022 11:43      Assessment/Plan Principal Problem:   HCAP (healthcare-associated pneumonia) Active Problems:   COPD exacerbation ([place]   Severe sepsis ([place]   Essential hypertension   Chronic kidney disease, stage 3a (HCC)   Type II diabetes mellitus with renal manifestations (HCC)   Back pain   BPH (benign prostatic hyperplasia)   Paroxysmal atrial fibrillation (HCC)   Cerebrovascular disease   Cigarette smoker   Depression with anxiety   Protein-calorie malnutrition, severe   Assessment and Plan:  HCAP and COPD exacerbation and  severe sepsis: pt meets criteria for severe sepsis with heart rate 108, RR 40.  Lactic acid 2.0, 2.4.  Patient may also have aspiration pneumonia.  - Will admit to PCU bed as inpt - IV Vancomycin and cefepime, Flagyl - Incentive spirometry - Solu-Medrol 40 mg twice daily - Mucinex for cough  - Bronchodilators - Urine legionella and S. pneumococcal antigen - Follow up blood culture x2, sputum culture - will get Procalcitonin and trend lactic acid level per sepsis protocol - IVF: 2L of IVF bolus in ED, followed by 1005 mL per hour of NS   Essential hypertension -Hold blood pressure medications due to hypotension  Chronic kidney disease, stage 3a ([place]: Close to baseline.  Recent baseline creatinine 1.22-1.6.  His creatinine is 1.37,  BUN 31, GFR 51. -Follow-up  by BMP  Type II diabetes mellitus with renal manifestations [facility]): Recent A1c 7.9, poorly controlled.  Patient still taking 70/30 insulin 8 units in morning and 12 units in  evening -SSI -70/30 insulin 4 units-6 units twice daily  Back pain: No acute issues by CT scan of T-spine and L-spine -prn Percocet, Tylenol -As needed Robaxin  BPH (benign prostatic hyperplasia) -Flomax  Paroxysmal atrial fibrillation [facility]): S/p of ablation.  Heart rate 108, 67 -Patient is not taking anticoagulants now -Tele monitoring  Cerebrovascular disease -Pravastatin  Cigarette smoker -Nicotine patch  Depression with anxiety -Continue home medications  Protein-calorie malnutrition, severe -Nutrition consult -Ensure     DVT ppx:  SQ Lovenox  Code Status: DNR per pt, his son and daughter-in-law (no CPR or intubation, but temporary vasopressor can be used if needed).   Family Communication: Yes, patient's son and daughter-in-law at bed side.     Disposition Plan:  Anticipate discharge back to previous environment  Consults called:  none  Admission status and Level of care: Progressive:   as inpt     Dispo: The patient is  from: Home              Anticipated d/c is to: Home              Anticipated d/c date is: 2 days              Patient currently is not medically stable to d/c.    Severity of Illness:  The appropriate patient status for this patient is INPATIENT. Inpatient status is judged to be reasonable and necessary in order to provide the required intensity of service to ensure the patient's safety. The patient's presenting symptoms, physical exam findings, and initial radiographic and laboratory data in the context of their chronic comorbidities is felt to place them at high risk for further clinical deterioration. Furthermore, it is not anticipated that the patient will be medically stable for discharge from the hospital within 2 midnights of admission.   * I certify that at the point of admission it is my clinical judgment that the patient will require inpatient hospital care spanning beyond 2 midnights from the point of admission due to high intensity of service, high risk for further deterioration and high frequency of surveillance required.*       Date of Service 06/17/2022    Ivor [facility]   If 7PM-7AM, please contact night-coverage [email or web address] 06/17/2022, 6:49 PM

## 2022-06-17 NOTE — Sepsis Progress Note (Signed)
Sepsis protocol monitored by eLink 

## 2022-06-17 NOTE — ED Notes (Signed)
BS 352

## 2022-06-17 NOTE — ED Notes (Signed)
Abrasion noted to face that are scabbed over

## 2022-06-17 NOTE — Consult Note (Signed)
Pharmacy Antibiotic Note  [name] is a 87 y.o. male admitted on 06/17/2022 with pneumonia.  Pharmacy has been consulted for cefepime and vancomycin dosing. Pt was admitted to hospital between 12/18 and 12/22.   Plan: Will start Cefepime 2 g q12H   Pt received vancomycin 1000 mg x 1 in the ED. Will start vancomycin 750 mg q24H. Predicted AUC of 569. Goal AUC of 400-600 Scr 1.37 (seems to be close to baseline), Vd 0.72, TBW. Check vancomycin level after 4th or 5th dose or if clinically worsening. Ordered MRSA PCR, if negative recommend discontinuing vancomycin.   Continue flagyl 500 mg BID.   Height: 6\' 2"  (188 cm) Weight: 58.3 kg (128 lb 8.5 oz) IBW/kg (Calculated) : 82.2  Temp (24hrs), Avg:97.6 F (36.4 C), Min:97.6 F (36.4 C), Max:97.6 F (36.4 C)  Recent Labs  Lab 06/10/22 1842 06/11/22 0420 06/14/22 0404 06/17/22 1043 06/17/22 1300 06/17/22 1438  WBC  --  12.6* 8.3 9.3  --   --   CREATININE  --  1.60* 1.22 1.37*  --   --   LATICACIDVEN 1.9  --   --  2.0* 2.4* 2.0*    Estimated Creatinine Clearance: 32.5 mL/min (A) (by C-G formula based on SCr of 1.37 mg/dL (H)).    Allergies  Allergen Reactions   Celebrex [Celecoxib] Rash and Other (See Comments)    Blisters, also   Tape Other (See Comments)    EKG leads leave red marks and PLEASE USE COBAN WRAP!! Skin tears and bruises easily    Antimicrobials this admission: 12/25 cefepime >>  12/25 vancomycin >>  12/25 flagyl >>    Dose adjustments this admission: None  Microbiology results: 12/25 BCx: pending 12/25 MRSA PCR: ordered.   Thank you for allowing pharmacy to be a part of this patient's care.  Oswald Hillock, PharmD, BCPS 06/17/2022 4:20 PM

## 2022-06-17 NOTE — ED Notes (Addendum)
Pt to ED with family, was discharged several days ago from [facility] after being admitted for PNA. Finished home abx already. Pt is tachypneic, retractions noted to neck area. PA at bedside.

## 2022-06-17 NOTE — ED Provider Triage Note (Signed)
Emergency Medicine Provider Triage Evaluation Note  [name] , a 87 y.o. male  was evaluated in triage.  Pt complains of back pain and SOB.  Recent admission after a fall, was diagnosed with pneumonia.  Has been compliant with antibiotics.  Granddaughter reports that he has been complaining of back pain more since his admission.  He has not had chest pain.  She has not had to increase his home O2.  No lower extremity swelling.  No fever.  Has had DVT in the past.  Review of Systems  Positive: Shortness of breath Negative: Chest pain, fever, lower extremity swelling  Physical Exam  Ht 6\' 2"  (1.88 m)   Wt 58.3 kg   BMI 16.50 kg/m  Gen:   Awake, no distress   Resp:  Normal effort  MSK:   Moves extremities without difficulty  Other:    Medical Decision Making  Medically screening exam initiated at 10:36 AM.  Appropriate orders placed.  [name] was informed that the remainder of the evaluation will be completed by another provider, this initial triage assessment does not replace that evaluation, and the importance of remaining in the ED until their evaluation is complete.     Marquette Old, PA-C 06/17/22 1045

## 2022-06-17 NOTE — Consult Note (Signed)
PHARMACY -  BRIEF ANTIBIOTIC NOTE   Pharmacy has received consult(s) for vancomycin and cefepime from an ED provider.  The patient's profile has been reviewed for ht/wt/allergies/indication/available labs.    One time order(s) placed for Vancomycin 1000 mg IV x1, Cefepime 2 g IV x1  Further antibiotics/pharmacy consults should be ordered by admitting physician if indicated.                       Thank you,  Gretel Acre, PharmD PGY1 Pharmacy Resident 06/17/2022 12:01 PM

## 2022-06-17 NOTE — ED Notes (Signed)
Had to look for blood culture bottles. Now drawing blood.

## 2022-06-17 NOTE — ED Provider Notes (Signed)
[facility] Provider Note    Event Date/Time   First MD Initiated Contact with Patient 06/17/22 1258     (approximate)   History   No chief complaint on file.   HPI  [name] is a 87 y.o. male  here with generalized weakness.  The patient was just hospitalized at [facility] following fall complicated by pneumonia.  He had a nasal fracture at that time as well.  He has COPD and was treated for COPD exacerbation with antibiotics and steroids and was discharged with home O2 at night.  He has gotten acutely worse over the last 24 hours and has become increasingly short of breath.  He has been more weak.  He has been more confused.  Family subsequent presents for evaluation.  He took his last dose of antibiotic yesterday.  Patient states he feels unwell but denies any other complaints.  Denies any pain currently.  He has not wanted to eat or drink much.      Physical Exam   Triage Vital Signs: ED Triage Vitals  Enc Vitals Group     BP 06/17/22 1036 (!) 83/53     Pulse Rate 06/17/22 1036 88     Resp 06/17/22 1042 (!) 40     Temp 06/17/22 1036 97.6 F (36.4 C)     Temp Source 06/17/22 1036 Oral     SpO2 06/17/22 1036 94 %     Weight 06/17/22 0958 128 lb 8.5 oz (58.3 kg)     Height 06/17/22 0958 6\' 2"  (1.88 m)     Head Circumference --      Peak Flow --      Pain Score --      Pain Loc --      Pain Edu? --      Excl. in [place]? --     Most recent vital signs: Vitals:   06/17/22 1730 06/17/22 1800  BP: (!) 146/68   Pulse:  81  Resp:    Temp:    SpO2:  100%     General: Awake, no distress.  CV:  Good peripheral perfusion.  Tachycardic. Resp:  Tachypnea with bilateral wheezes, rales throughout the right lower lobe. Abd:  No distention.  Other:  Dry mucous membranes.   ED Results / Procedures / Treatments   Labs (all labs ordered are listed, but only abnormal results are displayed) Labs Reviewed  BRAIN NATRIURETIC PEPTIDE - Abnormal;  Notable for the following components:      Result Value   B Natriuretic Peptide 172.2 (*)    All other components within normal limits  CBC WITH DIFFERENTIAL/PLATELET - Abnormal; Notable for the following components:   RBC 3.80 (*)    Hemoglobin 10.7 (*)    HCT 33.4 (*)    RDW 16.9 (*)    Monocytes Absolute 1.5 (*)    Abs Immature Granulocytes 0.09 (*)    All other components within normal limits  URINALYSIS, ROUTINE W REFLEX MICROSCOPIC - Abnormal; Notable for the following components:   Color, Urine YELLOW (*)    APPearance CLEAR (*)    All other components within normal limits  LACTIC ACID, PLASMA - Abnormal; Notable for the following components:   Lactic Acid, Venous 2.0 (*)    All other components within normal limits  LACTIC ACID, PLASMA - Abnormal; Notable for the following components:   Lactic Acid, Venous 2.4 (*)    All other components within normal limits  COMPREHENSIVE  METABOLIC PANEL - Abnormal; Notable for the following components:   CO2 21 (*)    Glucose, Bld 154 (*)    BUN 31 (*)    Creatinine, Ser 1.37 (*)    Calcium 8.8 (*)    Albumin 2.9 (*)    GFR, Estimated 51 (*)    All other components within normal limits  LACTIC ACID, PLASMA - Abnormal; Notable for the following components:   Lactic Acid, Venous 2.0 (*)    All other components within normal limits  CBG MONITORING, ED - Abnormal; Notable for the following components:   Glucose-Capillary 137 (*)    All other components within normal limits  CBG MONITORING, ED - Abnormal; Notable for the following components:   Glucose-Capillary 261 (*)    All other components within normal limits  RESP PANEL BY RT-PCR (RSV, FLU A&B, COVID)  RVPGX2  CULTURE, BLOOD (ROUTINE X 2)  CULTURE, BLOOD (ROUTINE X 2)  EXPECTORATED SPUTUM ASSESSMENT W GRAM STAIN, RFLX TO RESP C  MRSA NEXT GEN BY PCR, NASAL  LACTIC ACID, PLASMA  LEGIONELLA PNEUMOPHILA SEROGP 1 UR AG  STREP PNEUMONIAE URINARY ANTIGEN  BASIC METABOLIC PANEL   CBC  TROPONIN I (HIGH SENSITIVITY)  TROPONIN I (HIGH SENSITIVITY)     EKG Normal sinus rhythm, ventricular rate 86.  PR 134, QRS 82, QTc 433.  No acute ST elevations or depressions.  No EKG evidence of acute ischemia or infarct.   RADIOLOGY Chest x-ray: Persistent right lower lobe airspace opacity CT scans pending.   I also independently reviewed and agree with radiologist interpretations.   PROCEDURES:  Critical Care performed: Yes, see critical care procedure note(s)  .Critical Care  Performed by: Duffy Bruce, MD Authorized by: Duffy Bruce, MD   Critical care provider statement:    Critical care time (minutes):  30   Critical care time was exclusive of:  Separately billable procedures and treating other patients   Critical care was necessary to treat or prevent imminent or life-threatening deterioration of the following conditions:  Cardiac failure, circulatory failure and respiratory failure   Critical care was time spent personally by me on the following activities:  Development of treatment plan with patient or surrogate, discussions with consultants, evaluation of patient's response to treatment, examination of patient, ordering and review of laboratory studies, ordering and review of radiographic studies, ordering and performing treatments and interventions, pulse oximetry, re-evaluation of patient's condition and review of old charts   I assumed direction of critical care for this patient from another provider in my specialty: no     Care discussed with: admitting provider       [place] ED: Medications  lactated ringers infusion ( Intravenous New Bag/Given 06/17/22 1621)  ipratropium-albuterol (DUONEB) 0.5-2.5 (3) MG/3ML nebulizer solution 3 mL (3 mLs Nebulization Given 06/17/22 1611)  albuterol (PROVENTIL) (2.5 MG/3ML) 0.083% nebulizer solution 2.5 mg (has no administration in time range)  dextromethorphan-guaiFENesin (MUCINEX DM) 30-600 MG  per 12 hr tablet 1 tablet (has no administration in time range)  methylPREDNISolone sodium succinate (SOLU-MEDROL) 125 mg/2 mL injection 80 mg (80 mg Intravenous Given 06/17/22 1607)  nicotine (NICODERM CQ - dosed in mg/24 hours) patch 21 mg (21 mg Transdermal Patient Refused/Not Given 06/17/22 1611)  ondansetron (ZOFRAN) injection 4 mg (has no administration in time range)  acetaminophen (TYLENOL) tablet 650 mg (has no administration in time range)  feeding supplement (ENSURE ENLIVE / ENSURE PLUS) liquid 237 mL (237 mLs Oral Given 06/17/22 1611)  enoxaparin (LOVENOX)  injection 40 mg (has no administration in time range)  insulin aspart (novoLOG) injection 0-9 Units (1 Units Subcutaneous Given 06/17/22 1627)  insulin aspart (novoLOG) injection 0-5 Units (has no administration in time range)  metroNIDAZOLE (FLAGYL) IVPB 500 mg (0 mg Intravenous Stopped 06/17/22 1906)  oxyCODONE-acetaminophen (PERCOCET/ROXICET) 5-325 MG per tablet 1 tablet (has no administration in time range)  vancomycin (VANCOCIN) 750 mg in sodium chloride 0.9 % 250 mL IVPB (has no administration in time range)  ceFEPIme (MAXIPIME) 2 g in sodium chloride 0.9 % 100 mL IVPB (has no administration in time range)  aspirin tablet 325 mg (325 mg Oral Given 06/17/22 1859)  nitroGLYCERIN (NITROSTAT) SL tablet 0.4 mg (has no administration in time range)  pravastatin (PRAVACHOL) tablet 40 mg (has no administration in time range)  hydrOXYzine (ATARAX) tablet 25 mg (has no administration in time range)  insulin aspart protamine- aspart (NOVOLOG MIX 70/30) injection 4 Units (has no administration in time range)  pantoprazole (PROTONIX) EC tablet 40 mg (40 mg Oral Given 06/17/22 1859)  tamsulosin (FLOMAX) capsule 0.4 mg (has no administration in time range)  insulin aspart protamine- aspart (NOVOLOG MIX 70/30) injection 6 Units (has no administration in time range)  0.9 %  sodium chloride infusion (has no administration in time range)   methocarbamol (ROBAXIN) tablet 500 mg (has no administration in time range)  vancomycin (VANCOCIN) IVPB 1000 mg/200 mL premix (0 mg Intravenous Stopped 06/17/22 1448)  ceFEPIme (MAXIPIME) 2 g in sodium chloride 0.9 % 100 mL IVPB (0 g Intravenous Stopped 06/17/22 1330)  lactated ringers bolus 1,000 mL (0 mLs Intravenous Stopped 06/17/22 1448)  sodium chloride 0.9 % bolus 1,000 mL (1,000 mLs Intravenous New Bag/Given 06/17/22 1156)  0.9 %  sodium chloride infusion ( Intravenous New Bag/Given 06/17/22 1620)  iohexol (OMNIPAQUE) 350 MG/ML injection 75 mL (75 mLs Intravenous Contrast Given 06/17/22 1508)  ipratropium-albuterol (DUONEB) 0.5-2.5 (3) MG/3ML nebulizer solution 3 mL (3 mLs Nebulization Given 06/17/22 1611)     IMPRESSION / MDM / ASSESSMENT AND PLAN / ED COURSE  I reviewed the triage vital signs and the nursing notes.                              Differential diagnosis includes, but is not limited to, recurrent pneumonia, COPD exacerbation, deconditioning and weakness due to recent hospitalization, AKI, CHF, ACS, PE  Patient's presentation is most consistent with acute presentation with potential threat to life or bodily function.  The patient is on the cardiac monitor to evaluate for evidence of arrhythmia and/or significant heart rate changes  87 yo M with PMhx HTN, HLD, DM, COPD AS, here with SOB. Pt recently hospitalized for PNA, suspect ongoing/recurrent PNA with concomitant COPD. Pt hypoxic, wheezing on arrival. Pt was also hypotensive so sepsis protocol initiated with IVF, IV ABX and cultures sent. CXR shows persistent PNA. EKG nonischemic. LA elevated, WBC normal, CMP with normal renal function, mild dehydration with CO2 21. COVID and flu negative. CT angio obtained due to worsening resp status/recurrence of PNA in recently hospitalized patinent, reviewed and shows progressive infection with possible aspiration. CT T/L spine negative.  Will admit for recurrent/worsening  aspiration PNA with generalized weakness. Pt and family updated.    FINAL CLINICAL IMPRESSION(S) / ED DIAGNOSES   Final diagnoses:  Aspiration pneumonia, unspecified aspiration pneumonia type, unspecified laterality, unspecified part of lung ([place])  Acute respiratory failure with hypoxia ([place])     Rx /  DC Orders   ED Discharge Orders     None        Note:  This document was prepared using Dragon voice recognition software and may include unintentional dictation errors.   Duffy Bruce, MD 06/17/22 [phone]

## 2022-06-17 NOTE — Consult Note (Signed)
CODE SEPSIS - PHARMACY COMMUNICATION  **[facility] Antibiotics should be administered within 1 hour of Sepsis diagnosis**  Time Code Sepsis Called/Page Received: 1154  Antibiotics Ordered: Vancomycin 1000 mg IV x1, Cefepime 2 g IV x1   Time of 1st antibiotic administration: 1226  Additional action taken by pharmacy: N/A  Gretel Acre, PharmD PGY1 Pharmacy Resident 06/17/2022 12:00 PM

## 2022-06-17 NOTE — ED Notes (Signed)
Report given to Justus Memory,  RN

## 2022-06-17 NOTE — ED Triage Notes (Signed)
Presents with family  Per family his O2 sats are dropping   and he is having mid back pain  Was recently seen at [facility] d/t fall

## 2022-06-18 DIAGNOSIS — J189 Pneumonia, unspecified organism: Secondary | ICD-10-CM | POA: Diagnosis not present

## 2022-06-18 DIAGNOSIS — A419 Sepsis, unspecified organism: Secondary | ICD-10-CM | POA: Diagnosis not present

## 2022-06-18 DIAGNOSIS — R6520 Severe sepsis without septic shock: Secondary | ICD-10-CM | POA: Diagnosis not present

## 2022-06-18 LAB — BASIC METABOLIC PANEL
Anion gap: 9 (ref 5–15)
BUN: 26 mg/dL — ABNORMAL HIGH (ref 8–23)
CO2: 19 mmol/L — ABNORMAL LOW (ref 22–32)
Calcium: 8 mg/dL — ABNORMAL LOW (ref 8.9–10.3)
Chloride: 109 mmol/L (ref 98–111)
Creatinine, Ser: 1.1 mg/dL (ref 0.61–1.24)
GFR, Estimated: >60 mL/min (ref >60)
Glucose, Bld: 206 mg/dL — ABNORMAL HIGH (ref 70–99)
Potassium: 4.4 mmol/L (ref 3.5–5.1)
Sodium: 137 mmol/L (ref 135–145)

## 2022-06-18 LAB — CBG MONITORING, ED
Glucose-Capillary: 199 mg/dL — ABNORMAL HIGH (ref 70–99)
Glucose-Capillary: 350 mg/dL — ABNORMAL HIGH (ref 70–99)
Glucose-Capillary: 445 mg/dL — ABNORMAL HIGH (ref 70–99)
Glucose-Capillary: 319 mg/dL — ABNORMAL HIGH (ref 70–99)

## 2022-06-18 LAB — CBC
HCT: 26.7 % — ABNORMAL LOW (ref 39.0–52.0)
Hemoglobin: 8.7 g/dL — ABNORMAL LOW (ref 13.0–17.0)
MCH: 28.5 pg (ref 26.0–34.0)
MCHC: 32.6 g/dL (ref 30.0–36.0)
MCV: 87.5 fL (ref 80.0–100.0)
Platelets: 327 10*3/uL (ref 150–400)
RBC: 3.05 MIL/uL — ABNORMAL LOW (ref 4.22–5.81)
RDW: 17.1 % — ABNORMAL HIGH (ref 11.5–15.5)
WBC: 6.9 10*3/uL (ref 4.0–10.5)
nRBC: 0 % (ref 0.0–0.2)

## 2022-06-18 LAB — STREP PNEUMONIAE URINARY ANTIGEN: Strep Pneumo Urinary Antigen: NEGATIVE

## 2022-06-18 MED ORDER — GUAIFENESIN-DM 100-10 MG/5ML PO SYRP
5.0000 mL | ORAL_SOLUTION | Freq: Four times a day (QID) | ORAL | Status: DC | PRN
  Administered 2022-06-18 – 2022-06-27 (×6): 5 mL via ORAL
  Filled 2022-06-18 (×6): qty 10

## 2022-06-18 MED ORDER — VANCOMYCIN HCL 750 MG/150ML IV SOLN
750.0000 mg | INTRAVENOUS | Status: DC
  Filled 2022-06-18: qty 150

## 2022-06-18 MED ORDER — INSULIN ASPART 100 UNIT/ML IJ SOLN
0.0000 [IU] | Freq: Three times a day (TID) | INTRAMUSCULAR | Status: DC
  Administered 2022-06-18: 15 [IU] via SUBCUTANEOUS
  Administered 2022-06-19: 2 [IU] via SUBCUTANEOUS
  Administered 2022-06-19: 8 [IU] via SUBCUTANEOUS
  Administered 2022-06-19: 11 [IU] via SUBCUTANEOUS
  Administered 2022-06-20 (×2): 3 [IU] via SUBCUTANEOUS
  Administered 2022-06-20: 5 [IU] via SUBCUTANEOUS
  Administered 2022-06-21: 8 [IU] via SUBCUTANEOUS
  Administered 2022-06-21: 15 [IU] via SUBCUTANEOUS
  Administered 2022-06-21: 2 [IU] via SUBCUTANEOUS
  Administered 2022-06-22: 11 [IU] via SUBCUTANEOUS
  Administered 2022-06-22: 9 [IU] via SUBCUTANEOUS
  Administered 2022-06-22: 8 [IU] via SUBCUTANEOUS
  Administered 2022-06-23: 5 [IU] via SUBCUTANEOUS
  Administered 2022-06-23: 3 [IU] via SUBCUTANEOUS
  Administered 2022-06-23: 15 [IU] via SUBCUTANEOUS
  Administered 2022-06-24: 2 [IU] via SUBCUTANEOUS
  Administered 2022-06-24: 5 [IU] via SUBCUTANEOUS
  Administered 2022-06-25: 3 [IU] via SUBCUTANEOUS
  Administered 2022-06-25: 15 [IU] via SUBCUTANEOUS
  Administered 2022-06-26: 3 [IU] via SUBCUTANEOUS
  Administered 2022-06-26: 2 [IU] via SUBCUTANEOUS
  Administered 2022-06-27: 3 [IU] via SUBCUTANEOUS
  Administered 2022-06-27: 2 [IU] via SUBCUTANEOUS
  Administered 2022-06-28: 8 [IU] via SUBCUTANEOUS
  Administered 2022-06-28: 5 [IU] via SUBCUTANEOUS
  Filled 2022-06-18 (×26): qty 1

## 2022-06-18 MED ORDER — INSULIN ASPART PROT & ASPART (70-30 MIX) 100 UNIT/ML ~~LOC~~ SUSP
12.0000 [IU] | Freq: Every day | SUBCUTANEOUS | Status: DC
  Administered 2022-06-18 – 2022-06-19 (×2): 12 [IU] via SUBCUTANEOUS

## 2022-06-18 MED ORDER — INSULIN ASPART 100 UNIT/ML IJ SOLN
0.0000 [IU] | Freq: Every day | INTRAMUSCULAR | Status: DC
  Administered 2022-06-18: 4 [IU] via SUBCUTANEOUS
  Administered 2022-06-19: 5 [IU] via SUBCUTANEOUS
  Administered 2022-06-20: 2 [IU] via SUBCUTANEOUS
  Administered 2022-06-21 – 2022-06-22 (×2): 4 [IU] via SUBCUTANEOUS
  Administered 2022-06-23: 3 [IU] via SUBCUTANEOUS
  Administered 2022-06-25: 2 [IU] via SUBCUTANEOUS
  Filled 2022-06-18 (×7): qty 1

## 2022-06-18 MED ORDER — PREDNISONE 20 MG PO TABS
40.0000 mg | ORAL_TABLET | Freq: Every day | ORAL | Status: DC
  Administered 2022-06-19 – 2022-06-20 (×2): 40 mg via ORAL
  Filled 2022-06-18 (×2): qty 2

## 2022-06-18 MED ORDER — INSULIN ASPART PROT & ASPART (70-30 MIX) 100 UNIT/ML ~~LOC~~ SUSP
10.0000 [IU] | Freq: Every day | SUBCUTANEOUS | Status: DC
  Filled 2022-06-18: qty 10

## 2022-06-18 NOTE — ED Notes (Signed)
Pt's BG is 445, provider notified.

## 2022-06-18 NOTE — Progress Notes (Addendum)
Progress Note    CORLEONE BIEGLER  WGN:[id number] DOB: July 20, 1934  DOA: 06/17/2022 PCP: Susy Frizzle, MD      Brief Narrative:    Medical records reviewed and are as summarized below:  HEITH HAIGLER is a 87 y.o. male with medical history significant for recent discharge from the hospital on 06/14/2022 for community-acquired pneumonia, hypertension, hyperlipidemia, diabetes mellitus, COPD, severe AS, depression with anxiety, tobacco abuse, A flutter/A-fib, CKD-3A, DVT, aspiration pneumonia, BPH, who presented to the hospital with generalized weakness, low back pain, shortness of breath and dry cough. He was hypotensive in the ED with BP of 81/43.  He was found to have severe sepsis secondary to bilateral pneumonia and COPD exacerbation.  He was treated with empiric IV antibiotics, steroids, IV fluids and bronchodilators.    Assessment/Plan:   Principal Problem:   Bilateral pneumonia Active Problems:   COPD exacerbation (HCC)   Severe sepsis (HCC)   Essential hypertension   Chronic kidney disease, stage 3a (HCC)   Type II diabetes mellitus with renal manifestations (HCC)   Back pain   BPH (benign prostatic hyperplasia)   Paroxysmal atrial fibrillation (HCC)   Cerebrovascular disease   Cigarette smoker   Depression with anxiety   Protein-calorie malnutrition, severe   Body mass index is 16.5 kg/m.  (Underweight)    Severe sepsis secondary to bilateral pneumonia: Lactic acid was 2.4.  Continue empiric IV antibiotics.  Recent discharge from the hospital for pneumonia on 06/14/2022.   Hypotension: Resolved.  Discontinue IV fluids.   COPD exacerbation: Continue steroids and bronchodilators.   Insulin-dependent diabetes mellitus with hyperglycemia: Continue Novolin and NovoLog as needed.  Low back pain: CT lumbar spine did not show any acute fracture or abnormality.  Other comorbidities include history of stroke paroxysmal atrial fibrillation on  aspirin, tobacco use disorder, depression, anxiety, CKD stage III BPH, right upper lung nodule    Diet Order             Diet heart healthy/carb modified Room service appropriate? Yes; Fluid consistency: Thin  Diet effective now                            Consultants: None  Procedures: None    Medications:    aspirin  325 mg Oral Daily   enoxaparin (LOVENOX) injection  40 mg Subcutaneous Q24H   feeding supplement  237 mL Oral BID BM   insulin aspart  0-5 Units Subcutaneous QHS   insulin aspart  0-9 Units Subcutaneous TID WC   insulin aspart protamine- aspart  10 Units Subcutaneous Q breakfast   insulin aspart protamine- aspart  12 Units Subcutaneous Q supper   ipratropium-albuterol  3 mL Nebulization Q4H   methylPREDNISolone (SOLU-MEDROL) injection  80 mg Intravenous Daily   nicotine  21 mg Transdermal Daily   pantoprazole  40 mg Oral Daily   pravastatin  40 mg Oral QHS   tamsulosin  0.4 mg Oral Daily   Continuous Infusions:  sodium chloride Stopped (06/18/22 1119)   ceFEPime (MAXIPIME) IV Stopped (06/18/22 1030)   metronidazole Stopped (06/18/22 0522)   vancomycin       Anti-infectives (From admission, onward)    Start     Dose/Rate Route Frequency Ordered Stop   06/18/22 1200  vancomycin (VANCOCIN) 750 mg in sodium chloride 0.9 % 250 mL IVPB  Status:  Discontinued        750 mg 250 mL/hr over  60 Minutes Intravenous Every 24 hours 06/17/22 1628 06/18/22 0533   06/18/22 1200  vancomycin (VANCOREADY) IVPB 750 mg/150 mL        750 mg 150 mL/hr over 60 Minutes Intravenous Every 24 hours 06/18/22 0533     06/17/22 2200  ceFEPIme (MAXIPIME) 2 g in sodium chloride 0.9 % 100 mL IVPB        2 g 200 mL/hr over 30 Minutes Intravenous Every 12 hours 06/17/22 1628     06/17/22 1600  metroNIDAZOLE (FLAGYL) IVPB 500 mg        500 mg 100 mL/hr over 60 Minutes Intravenous Every 12 hours 06/17/22 1553     06/17/22 1200  vancomycin (VANCOCIN) IVPB 1000 mg/200 mL  premix        1,000 mg 200 mL/hr over 60 Minutes Intravenous  Once 06/17/22 1154 06/17/22 1448   06/17/22 1200  ceFEPIme (MAXIPIME) 2 g in sodium chloride 0.9 % 100 mL IVPB        2 g 200 mL/hr over 30 Minutes Intravenous  Once 06/17/22 1154 06/17/22 1330              Family Communication/Anticipated D/C date and plan/Code Status   DVT prophylaxis: enoxaparin (LOVENOX) injection 40 mg Start: 06/17/22 2200     Code Status: DNR  Family Communication: Son and daughter-in-la at the bedside Disposition Plan: Plan to discharge home in 2 to 3 days   Status is: Inpatient Remains inpatient appropriate because: Recurrent pneumonia       Subjective:   Interval events noted.  He complains of cough.    Objective:    Vitals:   06/18/22 0500 06/18/22 0530 06/18/22 0634 06/18/22 0800  BP: (!) 142/63 135/62 (!) 147/65   Pulse: 75 72 85   Resp: 19 20 19    Temp:   98.1 F (36.7 C)   TempSrc:   Oral   SpO2: 95% 96% 95% 97%  Weight:      Height:       No data found.   Intake/Output Summary (Last 24 hours) at 06/18/2022 1122 Last data filed at 06/18/2022 0555 Gross per 24 hour  Intake 100 ml  Output 620 ml  Net -520 ml   Filed Weights   06/17/22 0958  Weight: 58.3 kg    Exam:  GEN: NAD SKIN: Warm and dry EYES: No pallor or icterus ENT: MMM CV: RRR PULM: Coarse breath sounds, bibasilar rales, no wheezing ABD: soft, ND, NT, +BS CNS: AAO x 3, non focal EXT: No edema or tenderness    Pressure Injury 06/11/22 Sacrum Mid Stage 2 -  Partial thickness loss of dermis presenting as a shallow open injury with a red, pink wound bed without slough. (Active)  06/11/22 2300  Location: Sacrum  Location Orientation: Mid  Staging: Stage 2 -  Partial thickness loss of dermis presenting as a shallow open injury with a red, pink wound bed without slough.  Wound Description (Comments):   Present on Admission: Yes  Dressing Type Foam - Lift dressing to assess site every  shift 06/14/22 0800     Data Reviewed:   I have personally reviewed following labs and imaging studies:  Labs: Labs show the following:   Basic Metabolic Panel: Recent Labs  Lab 06/14/22 0404 06/17/22 1043 06/18/22 0500  NA 136 138 137  K 3.7 4.1 4.4  CL 105 104 109  CO2 23 21* 19*  GLUCOSE 167* 154* 206*  BUN 27* 31* 26*  CREATININE 1.22  1.37* 1.10  CALCIUM 8.1* 8.8* 8.0*  MG 1.7  --   --    GFR Estimated Creatinine Clearance: 40.5 mL/min (by C-G formula based on SCr of 1.1 mg/dL). Liver Function Tests: Recent Labs  Lab 06/17/22 1043  AST 35  ALT 23  ALKPHOS 55  BILITOT 1.2  PROT 7.6  ALBUMIN 2.9*   No results for input(s): "LIPASE", "AMYLASE" in the last 168 hours. No results for input(s): "AMMONIA" in the last 168 hours. Coagulation profile No results for input(s): "INR", "PROTIME" in the last 168 hours.  CBC: Recent Labs  Lab 06/14/22 0404 06/17/22 1043 06/18/22 0500  WBC 8.3 9.3 6.9  NEUTROABS  --  6.0  --   HGB 9.5* 10.7* 8.7*  HCT 27.5* 33.4* 26.7*  MCV 85.4 87.9 87.5  PLT 155 400 327   Cardiac Enzymes: Recent Labs  Lab 06/14/22 0404  CKTOTAL 70   BNP (last 3 results) No results for input(s): "PROBNP" in the last 8760 hours. CBG: Recent Labs  Lab 06/14/22 1253 06/17/22 1620 06/17/22 1951 06/17/22 2207 06/18/22 0749  GLUCAP 116* 137* 261* 352* 199*   D-Dimer: No results for input(s): "DDIMER" in the last 72 hours. Hgb A1c: No results for input(s): "HGBA1C" in the last 72 hours. Lipid Profile: No results for input(s): "CHOL", "HDL", "LDLCALC", "TRIG", "CHOLHDL", "LDLDIRECT" in the last 72 hours. Thyroid function studies: No results for input(s): "TSH", "T4TOTAL", "T3FREE", "THYROIDAB" in the last 72 hours.  Invalid input(s): "FREET3" Anemia work up: No results for input(s): "VITAMINB12", "FOLATE", "FERRITIN", "TIBC", "IRON", "RETICCTPCT" in the last 72 hours. Sepsis Labs: Recent Labs  Lab 06/14/22 0404 06/17/22 1043  06/17/22 1300 06/17/22 1438 06/17/22 1859 06/18/22 0500  WBC 8.3 9.3  --   --   --  6.9  LATICACIDVEN  --  2.0* 2.4* 2.0* 1.6  --     Microbiology Recent Results (from the past 240 hour(s))  Resp panel by RT-PCR (RSV, Flu A&B, Covid) Anterior Nasal Swab     Status: None   Collection Time: 06/10/22  8:43 AM   Specimen: Anterior Nasal Swab  Result Value Ref Range Status   SARS Coronavirus 2 by RT PCR NEGATIVE NEGATIVE Final    Comment: (NOTE) SARS-CoV-2 target nucleic acids are NOT DETECTED.  The SARS-CoV-2 RNA is generally detectable in upper respiratory specimens during the acute phase of infection. The lowest concentration of SARS-CoV-2 viral copies this assay can detect is 138 copies/mL. A negative result does not preclude SARS-Cov-2 infection and should not be used as the sole basis for treatment or other patient management decisions. A negative result may occur with  improper specimen collection/handling, submission of specimen other than nasopharyngeal swab, presence of viral mutation(s) within the areas targeted by this assay, and inadequate number of viral copies(<138 copies/mL). A negative result must be combined with clinical observations, patient history, and epidemiological information. The expected result is Negative.  Fact Sheet for Patients:  EntrepreneurPulse.com.au  Fact Sheet for Healthcare Providers:  IncredibleEmployment.be  This test is no t yet approved or cleared by the [place] FDA and  has been authorized for detection and/or diagnosis of SARS-CoV-2 by FDA under an Emergency Use Authorization (EUA). This EUA will remain  in effect (meaning this test can be used) for the duration of the COVID-19 declaration under Section 564(b)(1) of the Act, 21 U.S.C.section 360bbb-3(b)(1), unless the authorization is terminated  or revoked sooner.       Influenza A by PCR NEGATIVE NEGATIVE Final  Influenza B by PCR  NEGATIVE NEGATIVE Final    Comment: (NOTE) The Xpert Xpress SARS-CoV-2/FLU/RSV plus assay is intended as an aid in the diagnosis of influenza from Nasopharyngeal swab specimens and should not be used as a sole basis for treatment. Nasal washings and aspirates are unacceptable for Xpert Xpress SARS-CoV-2/FLU/RSV testing.  Fact Sheet for Patients: EntrepreneurPulse.com.au  Fact Sheet for Healthcare Providers: IncredibleEmployment.be  This test is not yet approved or cleared by the [place] FDA and has been authorized for detection and/or diagnosis of SARS-CoV-2 by FDA under an Emergency Use Authorization (EUA). This EUA will remain in effect (meaning this test can be used) for the duration of the COVID-19 declaration under Section 564(b)(1) of the Act, 21 U.S.C. section 360bbb-3(b)(1), unless the authorization is terminated or revoked.     Resp Syncytial Virus by PCR NEGATIVE NEGATIVE Final    Comment: (NOTE) Fact Sheet for Patients: EntrepreneurPulse.com.au  Fact Sheet for Healthcare Providers: IncredibleEmployment.be  This test is not yet approved or cleared by the [place] FDA and has been authorized for detection and/or diagnosis of SARS-CoV-2 by FDA under an Emergency Use Authorization (EUA). This EUA will remain in effect (meaning this test can be used) for the duration of the COVID-19 declaration under Section 564(b)(1) of the Act, 21 U.S.C. section 360bbb-3(b)(1), unless the authorization is terminated or revoked.  Performed at [facility], [place] [address], [place], [place] [address]   Resp panel by RT-PCR (RSV, Flu A&B, Covid) Anterior Nasal Swab     Status: None   Collection Time: 06/17/22 10:40 AM   Specimen: Anterior Nasal Swab  Result Value Ref Range Status   SARS Coronavirus 2 by RT PCR NEGATIVE NEGATIVE Final    Comment: (NOTE) SARS-CoV-2 target nucleic acids are NOT  DETECTED.  The SARS-CoV-2 RNA is generally detectable in upper respiratory specimens during the acute phase of infection. The lowest concentration of SARS-CoV-2 viral copies this assay can detect is 138 copies/mL. A negative result does not preclude SARS-Cov-2 infection and should not be used as the sole basis for treatment or other patient management decisions. A negative result may occur with  improper specimen collection/handling, submission of specimen other than nasopharyngeal swab, presence of viral mutation(s) within the areas targeted by this assay, and inadequate number of viral copies(<138 copies/mL). A negative result must be combined with clinical observations, patient history, and epidemiological information. The expected result is Negative.  Fact Sheet for Patients:  EntrepreneurPulse.com.au  Fact Sheet for Healthcare Providers:  IncredibleEmployment.be  This test is no t yet approved or cleared by the [place] FDA and  has been authorized for detection and/or diagnosis of SARS-CoV-2 by FDA under an Emergency Use Authorization (EUA). This EUA will remain  in effect (meaning this test can be used) for the duration of the COVID-19 declaration under Section 564(b)(1) of the Act, 21 U.S.C.section 360bbb-3(b)(1), unless the authorization is terminated  or revoked sooner.       Influenza A by PCR NEGATIVE NEGATIVE Final   Influenza B by PCR NEGATIVE NEGATIVE Final    Comment: (NOTE) The Xpert Xpress SARS-CoV-2/FLU/RSV plus assay is intended as an aid in the diagnosis of influenza from Nasopharyngeal swab specimens and should not be used as a sole basis for treatment. Nasal washings and aspirates are unacceptable for Xpert Xpress SARS-CoV-2/FLU/RSV testing.  Fact Sheet for Patients: EntrepreneurPulse.com.au  Fact Sheet for Healthcare Providers: IncredibleEmployment.be  This test is not yet  approved or cleared by the [place]  States FDA and has been authorized for detection and/or diagnosis of SARS-CoV-2 by FDA under an Emergency Use Authorization (EUA). This EUA will remain in effect (meaning this test can be used) for the duration of the COVID-19 declaration under Section 564(b)(1) of the Act, 21 U.S.C. section 360bbb-3(b)(1), unless the authorization is terminated or revoked.     Resp Syncytial Virus by PCR NEGATIVE NEGATIVE Final    Comment: (NOTE) Fact Sheet for Patients: EntrepreneurPulse.com.au  Fact Sheet for Healthcare Providers: IncredibleEmployment.be  This test is not yet approved or cleared by the [place] FDA and has been authorized for detection and/or diagnosis of SARS-CoV-2 by FDA under an Emergency Use Authorization (EUA). This EUA will remain in effect (meaning this test can be used) for the duration of the COVID-19 declaration under Section 564(b)(1) of the Act, 21 U.S.C. section 360bbb-3(b)(1), unless the authorization is terminated or revoked.  Performed at [facility], [address] [place], [place] [address]   Culture, blood (routine x 2)     Status: None (Preliminary result)   Collection Time: 06/17/22 10:43 AM   Specimen: BLOOD RIGHT FOREARM  Result Value Ref Range Status   Specimen Description BLOOD RIGHT FOREARM  Final   Special Requests   Final    BOTTLES DRAWN AEROBIC AND ANAEROBIC Blood Culture adequate volume   Culture   Final    NO GROWTH < 24 HOURS Performed at [facility], [address], [place], [place] [address]    Report Status PENDING  Incomplete  Culture, blood (routine x 2)     Status: None (Preliminary result)   Collection Time: 06/17/22 10:43 AM   Specimen: BLOOD RIGHT HAND  Result Value Ref Range Status   Specimen Description BLOOD RIGHT HAND  Final   Special Requests   Final    BOTTLES DRAWN AEROBIC AND ANAEROBIC Blood Culture adequate volume    Culture   Final    NO GROWTH < 24 HOURS Performed at [facility], [address] [place], [place] [address]    Report Status PENDING  Incomplete  MRSA Next Gen by PCR, Nasal     Status: None   Collection Time: 06/17/22  6:59 PM   Specimen: Nasal Mucosa; Nasal Swab  Result Value Ref Range Status   MRSA by PCR Next Gen NOT DETECTED NOT DETECTED Final    Comment: (NOTE) The GeneXpert MRSA Assay (FDA approved for NASAL specimens only), is one component of a comprehensive MRSA colonization surveillance program. It is not intended to diagnose MRSA infection nor to guide or monitor treatment for MRSA infections. Test performance is not FDA approved in patients less than 26 years old. Performed at [facility] [address] [place], [place] [address]     Procedures and diagnostic studies:  CT T-SPINE NO CHARGE  Result Date: 06/17/2022 CLINICAL DATA:  Initial evaluation for acute trauma, back pain. EXAM: CT THORACIC SPINE WITHOUT CONTRAST TECHNIQUE: Multidetector CT images of the thoracic were obtained using the standard protocol without intravenous contrast. RADIATION DOSE REDUCTION: This exam was performed according to the departmental dose-optimization program which includes automated exposure control, adjustment of the mA and/or kV according to patient size and/or use of iterative reconstruction technique. COMPARISON:  Concomitant CT of the chest. FINDINGS: Alignment: Physiologic with preservation of the normal thoracic kyphosis. No listhesis. Vertebrae: Vertebral body height maintained without acute or chronic fracture. Visualized ribs intact. No worrisome osseous lesions. Paraspinal and other soft tissues: Paraspinous soft tissues demonstrate no  acute finding. Right lower lobe consolidative opacity, which could reflect changes of infectious or aspiration pneumonitis. Aortic atherosclerosis. Emphysema noted. Disc levels: No significant disc pathology or stenosis evident  by CT. Mild for age multilevel degenerative endplate spurring. IMPRESSION: 1. No CT evidence for acute osseous abnormality within the thoracic spine. 2. Right lower lobe consolidative opacity, which could reflect changes of infectious or aspiration pneumonitis. Aortic Atherosclerosis (ICD10-I70.0) and Emphysema (ICD10-J43.9). Electronically Signed   By: Jeannine Boga M.D.   On: 06/17/2022 17:59   CT Lumbar Spine Wo Contrast  Result Date: 06/17/2022 CLINICAL DATA:  Initial evaluation for acute trauma. EXAM: CT LUMBAR SPINE WITHOUT CONTRAST TECHNIQUE: Multidetector CT imaging of the lumbar spine was performed without intravenous contrast administration. Multiplanar CT image reconstructions were also generated. RADIATION DOSE REDUCTION: This exam was performed according to the departmental dose-optimization program which includes automated exposure control, adjustment of the mA and/or kV according to patient size and/or use of iterative reconstruction technique. COMPARISON:  None Available. FINDINGS: Segmentation: Standard. Lowest well-formed disc space labeled the L5-S1 level. Alignment: Trace degenerative anterolisthesis of L4 on L5. Alignment otherwise normal preservation of the normal lumbar lordosis. Vertebrae: Vertebral body height well maintained without acute or chronic fracture. Visualized sacrum and pelvis intact. SI joints symmetric and normal. No discrete or worrisome osseous lesions. Paraspinal and other soft tissues: Paraspinous soft tissues demonstrate no acute finding. Bilateral nonobstructive nephrolithiasis. Advanced aorto bi-iliac atherosclerotic disease noted. Disc levels: Mild for age noncompressive disc bulging noted within the lumbar spine. Mild-to-moderate multilevel facet hypertrophy. No significant spinal stenosis by CT. IMPRESSION: 1. No acute osseous abnormality within the lumbar spine spine. 2. Bilateral nonobstructive nephrolithiasis. Aortic Atherosclerosis (ICD10-I70.0).  Electronically Signed   By: Jeannine Boga M.D.   On: 06/17/2022 17:51   CT Angio Chest PE W and/or Wo Contrast  Result Date: 06/17/2022 CLINICAL DATA:  Cough and congestion, back pain, tobacco abuse EXAM: CT ANGIOGRAPHY CHEST WITH CONTRAST TECHNIQUE: Multidetector CT imaging of the chest was performed using the standard protocol during bolus administration of intravenous contrast. Multiplanar CT image reconstructions and MIPs were obtained to evaluate the vascular anatomy. RADIATION DOSE REDUCTION: This exam was performed according to the departmental dose-optimization program which includes automated exposure control, adjustment of the mA and/or kV according to patient size and/or use of iterative reconstruction technique. CONTRAST:  44mL OMNIPAQUE IOHEXOL 350 MG/ML SOLN COMPARISON:  06/17/2022, 06/10/2022 FINDINGS: Cardiovascular: This is a technically adequate evaluation of the pulmonary vasculature. No filling defects or pulmonary emboli. Postsurgical changes from CABG. No evidence of thoracic aortic aneurysm or dissection. Aortic atherosclerosis unchanged. Mediastinum/Nodes: Subcentimeter mediastinal and hilar lymph nodes are likely reactive. Thyroid, trachea, and esophagus are unremarkable. Lungs/Pleura: There is progressive airspace disease within the dependent right lower lobe, with new patchy airspace disease seen within the right middle lobe. Findings are consistent with worsening infection or aspiration. Progressive opacification of the left lower lobe bronchi is noted, which may reflect mucoid impaction or aspiration. Scattered tree in bud nodular airspace disease within the left lower lobe is new since prior exam. Extensive emphysema again noted. Small cavitary region within the right apex reference image 29/5 likely reflect sequela from previous infection, and is stable. No effusion or pneumothorax. Upper Abdomen: No acute abnormality. Musculoskeletal: There are no acute or destructive bony  lesions. Reconstructed images demonstrate no additional findings. Review of the MIP images confirms the above findings. IMPRESSION: 1. No evidence of pulmonary embolus. 2. New and progressive airspace disease within the lung bases as  above, consistent with worsening infection or aspiration. 3. Increased opacification of the left lower lobe bronchi which may reflect mucoid impaction versus aspiration. 4. Subcentimeter reactive mediastinal and hilar lymph nodes. 5. Aortic Atherosclerosis (ICD10-I70.0) and Emphysema (ICD10-J43.9). Electronically Signed   By: Randa Ngo M.D.   On: 06/17/2022 15:30   DG Chest 2 View  Result Date: 06/17/2022 CLINICAL DATA:  sob EXAM: CHEST - 2 VIEW COMPARISON:  June 10, 2022 FINDINGS: The cardiomediastinal silhouette is unchanged in contour.Status post median sternotomy and CABG. No pleural effusion. No pneumothorax. Emphysematous changes. Persistent RIGHT lower lobe airspace opacities. This is similar comparison to prior. IMPRESSION: Persistent RIGHT lower lobe airspace opacities. This is favored to reflect infection. Electronically Signed   By: Valentino Saxon M.D.   On: 06/17/2022 11:43               LOS: 1 day   Lashawnna Lambrecht  [facility]   Pager on [email or web address]. If 7PM-7AM, please contact night-coverage at [email or web address]     06/18/2022, 11:22 AM

## 2022-06-18 NOTE — ED Notes (Signed)
Patient requesting cough medication

## 2022-06-18 NOTE — ED Notes (Addendum)
Neomia Glass, NP notified of BS reading of 445. Verbal order to give 15units of insulin

## 2022-06-18 NOTE — ED Notes (Signed)
BS 319

## 2022-06-19 ENCOUNTER — Inpatient Hospital Stay: Payer: Medicare HMO

## 2022-06-19 ENCOUNTER — Encounter: Payer: Self-pay | Admitting: Internal Medicine

## 2022-06-19 ENCOUNTER — Other Ambulatory Visit: Payer: Self-pay

## 2022-06-19 DIAGNOSIS — J441 Chronic obstructive pulmonary disease with (acute) exacerbation: Secondary | ICD-10-CM | POA: Diagnosis not present

## 2022-06-19 DIAGNOSIS — A419 Sepsis, unspecified organism: Secondary | ICD-10-CM | POA: Diagnosis not present

## 2022-06-19 DIAGNOSIS — J189 Pneumonia, unspecified organism: Secondary | ICD-10-CM | POA: Diagnosis not present

## 2022-06-19 DIAGNOSIS — R6520 Severe sepsis without septic shock: Secondary | ICD-10-CM | POA: Diagnosis not present

## 2022-06-19 LAB — LEGIONELLA PNEUMOPHILA SEROGP 1 UR AG: L. pneumophila Serogp 1 Ur Ag: NEGATIVE

## 2022-06-19 LAB — CBC WITH DIFFERENTIAL/PLATELET
Abs Immature Granulocytes: 0.24 10*3/uL — ABNORMAL HIGH (ref 0.00–0.07)
Basophils Absolute: 0 10*3/uL (ref 0.0–0.1)
Basophils Relative: 0 %
Eosinophils Absolute: 0 10*3/uL (ref 0.0–0.5)
Eosinophils Relative: 0 %
HCT: 22.1 % — ABNORMAL LOW (ref 39.0–52.0)
Hemoglobin: 7.1 g/dL — ABNORMAL LOW (ref 13.0–17.0)
Immature Granulocytes: 2 %
Lymphocytes Relative: 11 %
Lymphs Abs: 1.2 10*3/uL (ref 0.7–4.0)
MCH: 28.7 pg (ref 26.0–34.0)
MCHC: 32.1 g/dL (ref 30.0–36.0)
MCV: 89.5 fL (ref 80.0–100.0)
Monocytes Absolute: 1.2 10*3/uL — ABNORMAL HIGH (ref 0.1–1.0)
Monocytes Relative: 11 %
Neutro Abs: 8 10*3/uL — ABNORMAL HIGH (ref 1.7–7.7)
Neutrophils Relative %: 76 %
Platelets: 267 10*3/uL (ref 150–400)
RBC: 2.47 MIL/uL — ABNORMAL LOW (ref 4.22–5.81)
RDW: 17.2 % — ABNORMAL HIGH (ref 11.5–15.5)
WBC: 10.6 10*3/uL — ABNORMAL HIGH (ref 4.0–10.5)
nRBC: 0 % (ref 0.0–0.2)

## 2022-06-19 LAB — BASIC METABOLIC PANEL
Anion gap: 6 (ref 5–15)
BUN: 29 mg/dL — ABNORMAL HIGH (ref 8–23)
CO2: 23 mmol/L (ref 22–32)
Calcium: 8.2 mg/dL — ABNORMAL LOW (ref 8.9–10.3)
Chloride: 112 mmol/L — ABNORMAL HIGH (ref 98–111)
Creatinine, Ser: 1.11 mg/dL (ref 0.61–1.24)
GFR, Estimated: >60 mL/min (ref >60)
Glucose, Bld: 138 mg/dL — ABNORMAL HIGH (ref 70–99)
Potassium: 4.1 mmol/L (ref 3.5–5.1)
Sodium: 141 mmol/L (ref 135–145)

## 2022-06-19 LAB — CBG MONITORING, ED
Glucose-Capillary: 139 mg/dL — ABNORMAL HIGH (ref 70–99)
Glucose-Capillary: 255 mg/dL — ABNORMAL HIGH (ref 70–99)

## 2022-06-19 LAB — GLUCOSE, CAPILLARY
Glucose-Capillary: 348 mg/dL — ABNORMAL HIGH (ref 70–99)
Glucose-Capillary: 386 mg/dL — ABNORMAL HIGH (ref 70–99)

## 2022-06-19 LAB — ABO/RH: ABO/RH(D): O NEG

## 2022-06-19 LAB — PREPARE RBC (CROSSMATCH)

## 2022-06-19 MED ORDER — INSULIN ASPART PROT & ASPART (70-30 MIX) 100 UNIT/ML ~~LOC~~ SUSP
10.0000 [IU] | Freq: Every day | SUBCUTANEOUS | Status: DC
  Administered 2022-06-19 – 2022-06-21 (×3): 10 [IU] via SUBCUTANEOUS

## 2022-06-19 MED ORDER — IPRATROPIUM-ALBUTEROL 0.5-2.5 (3) MG/3ML IN SOLN
3.0000 mL | Freq: Three times a day (TID) | RESPIRATORY_TRACT | Status: DC
  Administered 2022-06-20 – 2022-06-26 (×20): 3 mL via RESPIRATORY_TRACT
  Filled 2022-06-19 (×20): qty 3

## 2022-06-19 MED ORDER — SODIUM CHLORIDE 0.9% IV SOLUTION
Freq: Once | INTRAVENOUS | Status: AC
  Filled 2022-06-19: qty 250

## 2022-06-19 NOTE — ED Notes (Signed)
Pt placed on room air at this time, tolerating well

## 2022-06-19 NOTE — ED Notes (Signed)
Pharmacy called to clarify confusion with insulin order - insulin aspart protamine-aspart. Per pharmacy, administer 10 units of insulin.

## 2022-06-19 NOTE — Progress Notes (Addendum)
Progress Note    [name]  WEX:[id number] DOB: December 30, 1934  DOA: 06/17/2022 PCP: Susy Frizzle, MD      Brief Narrative:    Medical records reviewed and are as summarized below:  [name] is a 87 y.o. male with medical history significant for recent discharge from the hospital on 06/14/2022 for community-acquired pneumonia, CAD s/p CABG, hypertension, hyperlipidemia, diabetes mellitus, COPD, chronic hypoxic respiratory failure on 2 L/min oxygen at night, severe AS, depression with anxiety, tobacco abuse, A flutter/A-fib, CKD-3A, DVT, aspiration pneumonia, BPH, who presented to the hospital with generalized weakness, low back pain, shortness of breath and dry cough. He was hypotensive in the ED with BP of 81/43.  He was found to have severe sepsis secondary to bilateral pneumonia and COPD exacerbation.  He was treated with empiric IV antibiotics, steroids, IV fluids and bronchodilators.    Assessment/Plan:   Principal Problem:   Bilateral pneumonia Active Problems:   COPD exacerbation (HCC)   Severe sepsis (HCC)   Essential hypertension   Chronic kidney disease, stage 3a (HCC)   Type II diabetes mellitus with renal manifestations (HCC)   Back pain   BPH (benign prostatic hyperplasia)   Paroxysmal atrial fibrillation (HCC)   Cerebrovascular disease   Cigarette smoker   Depression with anxiety   Protein-calorie malnutrition, severe   Body mass index is 16.5 kg/m.  (Underweight)    Severe sepsis secondary to bilateral pneumonia: Lactic acid was 2.4.  Continue IV cefepime and Flagyl.  MRSA PCR screen was negative Recent discharge from the hospital for pneumonia on 06/14/2022.   Acute on chronic anemia: Hemoglobin dropped from 8.7 to 7.1.  Hemoglobin was 10.7 on admission and 13.3 on 06/10/2022.  Transfuse 1 units of packed red blood cells because of underlying CAD.  Discussed risks, benefits of blood transfusion.  Patient and family agreeable to  blood transfusion. No evidence of bleeding at this time.   COPD exacerbation, chronic hypoxic respiratory failure: Continue steroids and bronchodilators.  On 2 L/min oxygen at night   Insulin-dependent diabetes mellitus with hyperglycemia: Continue Novolin and NovoLog as needed.   Low back pain: CT lumbar spine did not show any acute fracture or abnormality.   Hypotension: Resolved  Other comorbidities include history of stroke, CAD s/p CABG, paroxysmal atrial fibrillation on aspirin, tobacco use disorder, depression, anxiety, CKD stage III BPH, right upper lung nodule    Diet Order             Diet heart healthy/carb modified Room service appropriate? Yes; Fluid consistency: Thin  Diet effective now                            Consultants: None  Procedures: None    Medications:    aspirin  325 mg Oral Daily   enoxaparin (LOVENOX) injection  40 mg Subcutaneous Q24H   feeding supplement  237 mL Oral BID BM   insulin aspart  0-15 Units Subcutaneous TID WC   insulin aspart  0-5 Units Subcutaneous QHS   insulin aspart protamine- aspart  10 Units Subcutaneous Q breakfast   insulin aspart protamine- aspart  12 Units Subcutaneous Q supper   ipratropium-albuterol  3 mL Nebulization Q4H   nicotine  21 mg Transdermal Daily   pantoprazole  40 mg Oral Daily   pravastatin  40 mg Oral QHS   predniSONE  40 mg Oral Q breakfast   tamsulosin  0.4  mg Oral Daily   Continuous Infusions:  sodium chloride 100 mL/hr at 06/19/22 5176   ceFEPime (MAXIPIME) IV Stopped (06/19/22 1105)   metronidazole Stopped (06/19/22 0711)     Anti-infectives (From admission, onward)    Start     Dose/Rate Route Frequency Ordered Stop   06/18/22 1200  vancomycin (VANCOCIN) 750 mg in sodium chloride 0.9 % 250 mL IVPB  Status:  Discontinued        750 mg 250 mL/hr over 60 Minutes Intravenous Every 24 hours 06/17/22 1628 06/18/22 0533   06/18/22 1200  vancomycin (VANCOREADY) IVPB 750  mg/150 mL  Status:  Discontinued        750 mg 150 mL/hr over 60 Minutes Intravenous Every 24 hours 06/18/22 0533 06/18/22 1125   06/17/22 2200  ceFEPIme (MAXIPIME) 2 g in sodium chloride 0.9 % 100 mL IVPB        2 g 200 mL/hr over 30 Minutes Intravenous Every 12 hours 06/17/22 1628     06/17/22 1600  metroNIDAZOLE (FLAGYL) IVPB 500 mg        500 mg 100 mL/hr over 60 Minutes Intravenous Every 12 hours 06/17/22 1553     06/17/22 1200  vancomycin (VANCOCIN) IVPB 1000 mg/200 mL premix        1,000 mg 200 mL/hr over 60 Minutes Intravenous  Once 06/17/22 1154 06/17/22 1448   06/17/22 1200  ceFEPIme (MAXIPIME) 2 g in sodium chloride 0.9 % 100 mL IVPB        2 g 200 mL/hr over 30 Minutes Intravenous  Once 06/17/22 1154 06/17/22 1330              Family Communication/Anticipated D/C date and plan/Code Status   DVT prophylaxis: enoxaparin (LOVENOX) injection 40 mg Start: 06/17/22 2200     Code Status: DNR  Family Communication: Son and daughter-in-law at the bedside Disposition Plan: Plan to discharge home in 1 to 2 days   Status is: Inpatient Remains inpatient appropriate because: Recurrent pneumonia       Subjective:   Interval events noted.  He complains of cough.  Breathing is a little better.  He was requiring oxygen overnight but he uses oxygen at night.  His son and daughter-in-law were at the bedside.  Janett Billow, RN, was also at the bedside  Objective:    Vitals:   06/19/22 1215 06/19/22 1230 06/19/22 1308 06/19/22 1318  BP: (!) 140/64 133/63 (!) 124/52 (!) 117/53  Pulse: 76 74 75 76  Resp: (!) 24 (!) 29 (!) 22 (!) 22  Temp:   98 F (36.7 C) 97.8 F (36.6 C)  TempSrc:   Oral Oral  SpO2: 100% 99%    Weight:      Height:       No data found.   Intake/Output Summary (Last 24 hours) at 06/19/2022 1321 Last data filed at 06/19/2022 1607 Gross per 24 hour  Intake 2219.88 ml  Output 880 ml  Net 1339.88 ml   Filed Weights   06/17/22 0958  Weight: 58.3  kg    Exam:  GEN: NAD SKIN: Warm and dry EYES: Anicteric ENT: MMM, hard of hearing CV: RRR PULM: Coarse breath sounds, bibasilar rales ABD: soft, ND, NT, +BS CNS: AAO x 3, non focal EXT: No edema or tenderness     Pressure Injury 06/11/22 Sacrum Mid Stage 2 -  Partial thickness loss of dermis presenting as a shallow open injury with a red, pink wound bed without slough. (Active)  06/11/22 2300  Location: Sacrum  Location Orientation: Mid  Staging: Stage 2 -  Partial thickness loss of dermis presenting as a shallow open injury with a red, pink wound bed without slough.  Wound Description (Comments):   Present on Admission: Yes  Dressing Type Foam - Lift dressing to assess site every shift 06/19/22 0556     Data Reviewed:   I have personally reviewed following labs and imaging studies:  Labs: Labs show the following:   Basic Metabolic Panel: Recent Labs  Lab 06/14/22 0404 06/17/22 1043 06/18/22 0500 06/19/22 0608  NA 136 138 137 141  K 3.7 4.1 4.4 4.1  CL 105 104 109 112*  CO2 23 21* 19* 23  GLUCOSE 167* 154* 206* 138*  BUN 27* 31* 26* 29*  CREATININE 1.22 1.37* 1.10 1.11  CALCIUM 8.1* 8.8* 8.0* 8.2*  MG 1.7  --   --   --    GFR Estimated Creatinine Clearance: 40.1 mL/min (by C-G formula based on SCr of 1.11 mg/dL). Liver Function Tests: Recent Labs  Lab 06/17/22 1043  AST 35  ALT 23  ALKPHOS 55  BILITOT 1.2  PROT 7.6  ALBUMIN 2.9*   No results for input(s): "LIPASE", "AMYLASE" in the last 168 hours. No results for input(s): "AMMONIA" in the last 168 hours. Coagulation profile No results for input(s): "INR", "PROTIME" in the last 168 hours.  CBC: Recent Labs  Lab 06/14/22 0404 06/17/22 1043 06/18/22 0500 06/19/22 0510  WBC 8.3 9.3 6.9 10.6*  NEUTROABS  --  6.0  --  8.0*  HGB 9.5* 10.7* 8.7* 7.1*  HCT 27.5* 33.4* 26.7* 22.1*  MCV 85.4 87.9 87.5 89.5  PLT 155 400 327 267   Cardiac Enzymes: Recent Labs  Lab 06/14/22 0404  CKTOTAL 70    BNP (last 3 results) No results for input(s): "PROBNP" in the last 8760 hours. CBG: Recent Labs  Lab 06/18/22 1247 06/18/22 1857 06/18/22 2257 06/19/22 0744 06/19/22 1243  GLUCAP 350* 445* 319* 139* 255*   D-Dimer: No results for input(s): "DDIMER" in the last 72 hours. Hgb A1c: No results for input(s): "HGBA1C" in the last 72 hours. Lipid Profile: No results for input(s): "CHOL", "HDL", "LDLCALC", "TRIG", "CHOLHDL", "LDLDIRECT" in the last 72 hours. Thyroid function studies: No results for input(s): "TSH", "T4TOTAL", "T3FREE", "THYROIDAB" in the last 72 hours.  Invalid input(s): "FREET3" Anemia work up: No results for input(s): "VITAMINB12", "FOLATE", "FERRITIN", "TIBC", "IRON", "RETICCTPCT" in the last 72 hours. Sepsis Labs: Recent Labs  Lab 06/14/22 0404 06/17/22 1043 06/17/22 1300 06/17/22 1438 06/17/22 1859 06/18/22 0500 06/19/22 0510  WBC 8.3 9.3  --   --   --  6.9 10.6*  LATICACIDVEN  --  2.0* 2.4* 2.0* 1.6  --   --     Microbiology Recent Results (from the past 240 hour(s))  Resp panel by RT-PCR (RSV, Flu A&B, Covid) Anterior Nasal Swab     Status: None   Collection Time: 06/10/22  8:43 AM   Specimen: Anterior Nasal Swab  Result Value Ref Range Status   SARS Coronavirus 2 by RT PCR NEGATIVE NEGATIVE Final    Comment: (NOTE) SARS-CoV-2 target nucleic acids are NOT DETECTED.  The SARS-CoV-2 RNA is generally detectable in upper respiratory specimens during the acute phase of infection. The lowest concentration of SARS-CoV-2 viral copies this assay can detect is 138 copies/mL. A negative result does not preclude SARS-Cov-2 infection and should not be used as the sole basis for treatment or other patient management decisions. A negative  result may occur with  improper specimen collection/handling, submission of specimen other than nasopharyngeal swab, presence of viral mutation(s) within the areas targeted by this assay, and inadequate number of  viral copies(<138 copies/mL). A negative result must be combined with clinical observations, patient history, and epidemiological information. The expected result is Negative.  Fact Sheet for Patients:  EntrepreneurPulse.com.au  Fact Sheet for Healthcare Providers:  IncredibleEmployment.be  This test is no t yet approved or cleared by the [place] FDA and  has been authorized for detection and/or diagnosis of SARS-CoV-2 by FDA under an Emergency Use Authorization (EUA). This EUA will remain  in effect (meaning this test can be used) for the duration of the COVID-19 declaration under Section 564(b)(1) of the Act, 21 U.S.C.section 360bbb-3(b)(1), unless the authorization is terminated  or revoked sooner.       Influenza A by PCR NEGATIVE NEGATIVE Final   Influenza B by PCR NEGATIVE NEGATIVE Final    Comment: (NOTE) The Xpert Xpress SARS-CoV-2/FLU/RSV plus assay is intended as an aid in the diagnosis of influenza from Nasopharyngeal swab specimens and should not be used as a sole basis for treatment. Nasal washings and aspirates are unacceptable for Xpert Xpress SARS-CoV-2/FLU/RSV testing.  Fact Sheet for Patients: EntrepreneurPulse.com.au  Fact Sheet for Healthcare Providers: IncredibleEmployment.be  This test is not yet approved or cleared by the [place] FDA and has been authorized for detection and/or diagnosis of SARS-CoV-2 by FDA under an Emergency Use Authorization (EUA). This EUA will remain in effect (meaning this test can be used) for the duration of the COVID-19 declaration under Section 564(b)(1) of the Act, 21 U.S.C. section 360bbb-3(b)(1), unless the authorization is terminated or revoked.     Resp Syncytial Virus by PCR NEGATIVE NEGATIVE Final    Comment: (NOTE) Fact Sheet for Patients: EntrepreneurPulse.com.au  Fact Sheet for Healthcare  Providers: IncredibleEmployment.be  This test is not yet approved or cleared by the [place] FDA and has been authorized for detection and/or diagnosis of SARS-CoV-2 by FDA under an Emergency Use Authorization (EUA). This EUA will remain in effect (meaning this test can be used) for the duration of the COVID-19 declaration under Section 564(b)(1) of the Act, 21 U.S.C. section 360bbb-3(b)(1), unless the authorization is terminated or revoked.  Performed at [facility], [place] [address], [place], [place] [address]   Resp panel by RT-PCR (RSV, Flu A&B, Covid) Anterior Nasal Swab     Status: None   Collection Time: 06/17/22 10:40 AM   Specimen: Anterior Nasal Swab  Result Value Ref Range Status   SARS Coronavirus 2 by RT PCR NEGATIVE NEGATIVE Final    Comment: (NOTE) SARS-CoV-2 target nucleic acids are NOT DETECTED.  The SARS-CoV-2 RNA is generally detectable in upper respiratory specimens during the acute phase of infection. The lowest concentration of SARS-CoV-2 viral copies this assay can detect is 138 copies/mL. A negative result does not preclude SARS-Cov-2 infection and should not be used as the sole basis for treatment or other patient management decisions. A negative result may occur with  improper specimen collection/handling, submission of specimen other than nasopharyngeal swab, presence of viral mutation(s) within the areas targeted by this assay, and inadequate number of viral copies(<138 copies/mL). A negative result must be combined with clinical observations, patient history, and epidemiological information. The expected result is Negative.  Fact Sheet for Patients:  EntrepreneurPulse.com.au  Fact Sheet for Healthcare Providers:  IncredibleEmployment.be  This test is no t yet approved or cleared by the [place]  States FDA and  has been authorized for detection and/or diagnosis of SARS-CoV-2 by FDA  under an Emergency Use Authorization (EUA). This EUA will remain  in effect (meaning this test can be used) for the duration of the COVID-19 declaration under Section 564(b)(1) of the Act, 21 U.S.C.section 360bbb-3(b)(1), unless the authorization is terminated  or revoked sooner.       Influenza A by PCR NEGATIVE NEGATIVE Final   Influenza B by PCR NEGATIVE NEGATIVE Final    Comment: (NOTE) The Xpert Xpress SARS-CoV-2/FLU/RSV plus assay is intended as an aid in the diagnosis of influenza from Nasopharyngeal swab specimens and should not be used as a sole basis for treatment. Nasal washings and aspirates are unacceptable for Xpert Xpress SARS-CoV-2/FLU/RSV testing.  Fact Sheet for Patients: EntrepreneurPulse.com.au  Fact Sheet for Healthcare Providers: IncredibleEmployment.be  This test is not yet approved or cleared by the [place] FDA and has been authorized for detection and/or diagnosis of SARS-CoV-2 by FDA under an Emergency Use Authorization (EUA). This EUA will remain in effect (meaning this test can be used) for the duration of the COVID-19 declaration under Section 564(b)(1) of the Act, 21 U.S.C. section 360bbb-3(b)(1), unless the authorization is terminated or revoked.     Resp Syncytial Virus by PCR NEGATIVE NEGATIVE Final    Comment: (NOTE) Fact Sheet for Patients: EntrepreneurPulse.com.au  Fact Sheet for Healthcare Providers: IncredibleEmployment.be  This test is not yet approved or cleared by the [place] FDA and has been authorized for detection and/or diagnosis of SARS-CoV-2 by FDA under an Emergency Use Authorization (EUA). This EUA will remain in effect (meaning this test can be used) for the duration of the COVID-19 declaration under Section 564(b)(1) of the Act, 21 U.S.C. section 360bbb-3(b)(1), unless the authorization is terminated or revoked.  Performed at [facility], [address] [place], [place] [address]   Culture, blood (routine x 2)     Status: None (Preliminary result)   Collection Time: 06/17/22 10:43 AM   Specimen: BLOOD RIGHT FOREARM  Result Value Ref Range Status   Specimen Description BLOOD RIGHT FOREARM  Final   Special Requests   Final    BOTTLES DRAWN AEROBIC AND ANAEROBIC Blood Culture adequate volume   Culture   Final    NO GROWTH 2 DAYS Performed at [facility], [address] [place], [place] [address]    Report Status PENDING  Incomplete  Culture, blood (routine x 2)     Status: None (Preliminary result)   Collection Time: 06/17/22 10:43 AM   Specimen: BLOOD RIGHT HAND  Result Value Ref Range Status   Specimen Description BLOOD RIGHT HAND  Final   Special Requests   Final    BOTTLES DRAWN AEROBIC AND ANAEROBIC Blood Culture adequate volume   Culture   Final    NO GROWTH 2 DAYS Performed at [facility], [address] [place], [place] [address]    Report Status PENDING  Incomplete  MRSA Next Gen by PCR, Nasal     Status: None   Collection Time: 06/17/22  6:59 PM   Specimen: Nasal Mucosa; Nasal Swab  Result Value Ref Range Status   MRSA by PCR Next Gen NOT DETECTED NOT DETECTED Final    Comment: (NOTE) The GeneXpert MRSA Assay (FDA approved for NASAL specimens only), is one component of a comprehensive MRSA colonization surveillance program. It is not intended to diagnose MRSA infection nor to guide or monitor treatment for MRSA infections. Test performance  is not FDA approved in patients less than 71 years old. Performed at [facility], [address] [place], [place] [address]     Procedures and diagnostic studies:  CT T-SPINE NO CHARGE  Result Date: 06/17/2022 CLINICAL DATA:  Initial evaluation for acute trauma, back pain. EXAM: CT THORACIC SPINE WITHOUT CONTRAST TECHNIQUE: Multidetector CT images of the thoracic were obtained using the standard protocol  without intravenous contrast. RADIATION DOSE REDUCTION: This exam was performed according to the departmental dose-optimization program which includes automated exposure control, adjustment of the mA and/or kV according to patient size and/or use of iterative reconstruction technique. COMPARISON:  Concomitant CT of the chest. FINDINGS: Alignment: Physiologic with preservation of the normal thoracic kyphosis. No listhesis. Vertebrae: Vertebral body height maintained without acute or chronic fracture. Visualized ribs intact. No worrisome osseous lesions. Paraspinal and other soft tissues: Paraspinous soft tissues demonstrate no acute finding. Right lower lobe consolidative opacity, which could reflect changes of infectious or aspiration pneumonitis. Aortic atherosclerosis. Emphysema noted. Disc levels: No significant disc pathology or stenosis evident by CT. Mild for age multilevel degenerative endplate spurring. IMPRESSION: 1. No CT evidence for acute osseous abnormality within the thoracic spine. 2. Right lower lobe consolidative opacity, which could reflect changes of infectious or aspiration pneumonitis. Aortic Atherosclerosis (ICD10-I70.0) and Emphysema (ICD10-J43.9). Electronically Signed   By: Jeannine Boga M.D.   On: 06/17/2022 17:59   CT Lumbar Spine Wo Contrast  Result Date: 06/17/2022 CLINICAL DATA:  Initial evaluation for acute trauma. EXAM: CT LUMBAR SPINE WITHOUT CONTRAST TECHNIQUE: Multidetector CT imaging of the lumbar spine was performed without intravenous contrast administration. Multiplanar CT image reconstructions were also generated. RADIATION DOSE REDUCTION: This exam was performed according to the departmental dose-optimization program which includes automated exposure control, adjustment of the mA and/or kV according to patient size and/or use of iterative reconstruction technique. COMPARISON:  None Available. FINDINGS: Segmentation: Standard. Lowest well-formed disc space labeled  the L5-S1 level. Alignment: Trace degenerative anterolisthesis of L4 on L5. Alignment otherwise normal preservation of the normal lumbar lordosis. Vertebrae: Vertebral body height well maintained without acute or chronic fracture. Visualized sacrum and pelvis intact. SI joints symmetric and normal. No discrete or worrisome osseous lesions. Paraspinal and other soft tissues: Paraspinous soft tissues demonstrate no acute finding. Bilateral nonobstructive nephrolithiasis. Advanced aorto bi-iliac atherosclerotic disease noted. Disc levels: Mild for age noncompressive disc bulging noted within the lumbar spine. Mild-to-moderate multilevel facet hypertrophy. No significant spinal stenosis by CT. IMPRESSION: 1. No acute osseous abnormality within the lumbar spine spine. 2. Bilateral nonobstructive nephrolithiasis. Aortic Atherosclerosis (ICD10-I70.0). Electronically Signed   By: Jeannine Boga M.D.   On: 06/17/2022 17:51   CT Angio Chest PE W and/or Wo Contrast  Result Date: 06/17/2022 CLINICAL DATA:  Cough and congestion, back pain, tobacco abuse EXAM: CT ANGIOGRAPHY CHEST WITH CONTRAST TECHNIQUE: Multidetector CT imaging of the chest was performed using the standard protocol during bolus administration of intravenous contrast. Multiplanar CT image reconstructions and MIPs were obtained to evaluate the vascular anatomy. RADIATION DOSE REDUCTION: This exam was performed according to the departmental dose-optimization program which includes automated exposure control, adjustment of the mA and/or kV according to patient size and/or use of iterative reconstruction technique. CONTRAST:  34mL OMNIPAQUE IOHEXOL 350 MG/ML SOLN COMPARISON:  06/17/2022, 06/10/2022 FINDINGS: Cardiovascular: This is a technically adequate evaluation of the pulmonary vasculature. No filling defects or pulmonary emboli. Postsurgical changes from CABG. No evidence of thoracic aortic aneurysm or dissection. Aortic atherosclerosis unchanged.  Mediastinum/Nodes: Subcentimeter mediastinal  and hilar lymph nodes are likely reactive. Thyroid, trachea, and esophagus are unremarkable. Lungs/Pleura: There is progressive airspace disease within the dependent right lower lobe, with new patchy airspace disease seen within the right middle lobe. Findings are consistent with worsening infection or aspiration. Progressive opacification of the left lower lobe bronchi is noted, which may reflect mucoid impaction or aspiration. Scattered tree in bud nodular airspace disease within the left lower lobe is new since prior exam. Extensive emphysema again noted. Small cavitary region within the right apex reference image 29/5 likely reflect sequela from previous infection, and is stable. No effusion or pneumothorax. Upper Abdomen: No acute abnormality. Musculoskeletal: There are no acute or destructive bony lesions. Reconstructed images demonstrate no additional findings. Review of the MIP images confirms the above findings. IMPRESSION: 1. No evidence of pulmonary embolus. 2. New and progressive airspace disease within the lung bases as above, consistent with worsening infection or aspiration. 3. Increased opacification of the left lower lobe bronchi which may reflect mucoid impaction versus aspiration. 4. Subcentimeter reactive mediastinal and hilar lymph nodes. 5. Aortic Atherosclerosis (ICD10-I70.0) and Emphysema (ICD10-J43.9). Electronically Signed   By: Randa Ngo M.D.   On: 06/17/2022 15:30               LOS: 2 days   Okla Qazi  [facility]   Pager on [email or web address]. If 7PM-7AM, please contact night-coverage at [email or web address]     06/19/2022, 1:21 PM

## 2022-06-19 NOTE — Progress Notes (Signed)
       CROSS COVER NOTE  NAME: JULES BATY MRN: [id number] DOB : 10/12/1934 ATTENDING PHYSICIAN: Jennye Boroughs, MD    Date of Service   06/19/2022   HPI/Events of Note   Message received from RN for patient report of Dyspnea, chest pain, and supplemental oxygen increase from 2L-->4L.   Mr Ysaguirre endorses 6/10 (L) chest pain described as aching that began tonight. The pain is not reproducible on palpation.  He endorses Dyspnea and fatigue. Denies palpitations, dizziness, abdominal pain, nausea, vomiting, or radiation of pain.  On auscultation of heart S1, S2 present no murmur, rubs, or gallop heard. Fine crackles heard in (R) middle lobe and bilateral lower lobes pm auscultation of lungs. Mr Pomplun has received IVF resuscitation since arrival on 06/17/22 for sepsis and hypotension.   Interventions   Assessment/Plan:  Give PRN albuterol EKG Troponin- 19-->23 Morphine 2 mg Hold IVF IV Lasix 20 mg        To reach the provider On-Call:   7AM- 7PM see care teams to locate the attending and reach out to them via [email or web address]. 7PM-7AM contact night-coverage If you still have difficulty reaching the appropriate provider, please page the [facility] (Director on Call) for [facility] on amion for assistance  This document was prepared using Set designer software and may include unintentional dictation errors.  Neomia Glass DNP, MBA, FNP-BC Nurse Practitioner [facility] Pager [phone]

## 2022-06-19 NOTE — ED Notes (Signed)
Stage 2 pressure wound noted to coccyx with light yellow slough noted

## 2022-06-19 NOTE — ED Notes (Signed)
Lavender and green top re-drawn per labs request.  Sent to lab

## 2022-06-19 NOTE — ED Notes (Signed)
Patient placed on external male cath

## 2022-06-19 NOTE — ED Notes (Addendum)
Mepilex placed on patient. New linens, gown and diaper changed. Patient placed on hospital bed from ED stretcher

## 2022-06-19 NOTE — Evaluation (Signed)
Physical Therapy Evaluation Patient Details Name: [name] MRN: [id number] DOB: [birth year] Today's Date: 06/19/2022  History of Present Illness  [name] is a 87 y.o. male with medical history significant of hypertension, hyperlipidemia, diabetes mellitus, COPD, severe AS, depression with anxiety, tobacco abuse, A flutter/A-fib, CKD-3A, DVT, aspiration pneumonia, BPH, who presents with shortness breath.   Clinical Impression  Pt admitted with above diagnosis. Pt received upright in bed agreeable to PT eval. RN reports pt receiving unit of blood but cleared for PT. VSS prior to mobility. Pt extremely HoH and no family present to assist. Per EMR pt recently admitted for PNA. Per pt, he was ambulatory without AD at son's house after last admission. Nods his head yes when asked if he planned on going back to son's house. Unable to report accurate DME he has at his disposal.   To date pt transfers to EOB with supervision, bed features and increased time. Pt appearing deconditioned with increased WOB with transfer but able to stand supervision without AD. Pt standing ~2-3 minutes with good stability, intermittent RUE support on tray table for light steadying. Pt reports knee pain from prior fall leading to prior admission. Pt returns to sitting with poor eccentric control. Notable SOB but SPO2 > 90% throughout mobility in standing. Initial desaturation appreciated with bed mobility but poor pleth wave form reading so unsure of accuracy but tele unit reporting low 80's with quick return > 90% once sitting still.  Limited progression of OOB mobility due to lines/leads and receiving unit of blood. Anticipate pt will be appropriate to d/c home with [facility] PT services and family support pending progression of OOB mobility with rehab during admission. Pt upright in bed educated on benefits of upright posture for pulmonary hygiene. All needs in reach with VSS. Pt currently with functional limitations due  to the deficits listed below (see PT Problem List). Pt will benefit from skilled PT to increase their independence and safety with mobility to allow discharge to the venue listed below.      Recommendations for follow up therapy are one component of a multi-disciplinary discharge planning process, led by the attending physician.  Recommendations may be updated based on patient status, additional functional criteria and insurance authorization.  Follow Up Recommendations Home health PT      Assistance Recommended at Discharge Frequent or constant Supervision/Assistance  Patient can return home with the following  A little help with walking and/or transfers;Assistance with cooking/housework;Assist for transportation;A little help with bathing/dressing/bathroom;Help with stairs or ramp for entrance    Equipment Recommendations Other (comment) (due to [facility] hard to understand what pt has/will need. Will hold off on DME until family present to assist in DME.)  Recommendations for Other Services       Functional Status Assessment Patient has had a recent decline in their functional status and demonstrates the ability to make significant improvements in function in a reasonable and predictable amount of time.     Precautions / Restrictions Precautions Precautions: Fall Precaution Comments: Very HOH Restrictions Weight Bearing Restrictions: No      Mobility  Bed Mobility Overal bed mobility: Needs Assistance Bed Mobility: Supine to Sit, Sit to Supine     Supine to sit: Supervision Sit to supine: Supervision   General bed mobility comments: assist with lines and leads Patient Response: Cooperative  Transfers Overall transfer level: Needs assistance Equipment used: None Transfers: Sit to/from Stand Sit to Stand: Supervision  General transfer comment: stands with supervision    Ambulation/Gait               General Gait Details: deferred due to lines/leads,  receiving unit of blood.  Stairs            Wheelchair Mobility    Modified Rankin (Stroke Patients Only)       Balance Overall balance assessment: Needs assistance Sitting-balance support: Bilateral upper extremity supported, Feet supported Sitting balance-Leahy Scale: Good     Standing balance support: No upper extremity supported, During functional activity Standing balance-Leahy Scale: Fair Standing balance comment: able to maintain standing without UE support. INtermittent use of hand on coutner to steady himself.                             Pertinent Vitals/Pain Pain Assessment Pain Assessment: Faces Faces Pain Scale: Hurts a little bit Pain Location: B knees Pain Descriptors / Indicators: Discomfort, Grimacing Pain Intervention(s): Limited activity within patient's tolerance, Monitored during session, Repositioned    Home Living Family/patient expects to be discharged to:: Private residence Living Arrangements: Children (to son's house) Available Help at Discharge: Family;Available 24 hours/day Type of Home: House Home Access: Stairs to enter Entrance Stairs-Rails:  (reports son's house has rails but does not say which side) Entrance Stairs-Number of Steps: 6   Home Layout: One level        Prior Function Prior Level of Function : Independent/Modified Independent             Mobility Comments: Reports since last admission, was ambulating without AD per his PLOF.       Hand Dominance        Extremity/Trunk Assessment   Upper Extremity Assessment Upper Extremity Assessment: Defer to OT evaluation    Lower Extremity Assessment Lower Extremity Assessment: Generalized weakness    Cervical / Trunk Assessment Cervical / Trunk Assessment: Kyphotic  Communication   Communication: HOH  Cognition Arousal/Alertness: Awake/alert Behavior During Therapy: WFL for tasks assessed/performed Overall Cognitive Status: Difficult to assess                                           General Comments General comments (skin integrity, edema, etc.): Desat to low 80's transferring to EOB but poor wave form. Maintained > 90% with STS and standing.    Exercises Other Exercises Other Exercises: Role of PT in acute setting, d/c recs   Assessment/Plan    PT Assessment Patient needs continued PT services  PT Problem List Decreased strength;Decreased activity tolerance;Decreased balance;Decreased mobility       PT Treatment Interventions DME instruction;Gait training;Stair training;Functional mobility training;Patient/family education;Therapeutic activities;Therapeutic exercise;Balance training    PT Goals (Current goals can be found in the Care Plan section)  Acute Rehab PT Goals Patient Stated Goal: improve breathing, go home PT Goal Formulation: With patient Time For Goal Achievement: 07/03/22 Potential to Achieve Goals: Good    Frequency Min 2X/week     Co-evaluation               AM-PAC PT "6 Clicks" Mobility  Outcome Measure Help needed turning from your back to your side while in a flat bed without using bedrails?: A Little Help needed moving from lying on your back to sitting on the side of a flat bed without using bedrails?: A  Little Help needed moving to and from a bed to a chair (including a wheelchair)?: A Little Help needed standing up from a chair using your arms (e.g., wheelchair or bedside chair)?: A Little Help needed to walk in hospital room?: A Little Help needed climbing 3-5 steps with a railing? : A Lot 6 Click Score: 17    End of Session Equipment Utilized During Treatment: Gait belt Activity Tolerance: Patient tolerated treatment well Patient left: in bed;with call bell/phone within reach;with bed alarm set Nurse Communication: Mobility status PT Visit Diagnosis: Other abnormalities of gait and mobility (R26.89) Pain - Right/Left: Right Pain - part of body: Knee     Time: 1430-1455 PT Time Calculation (min) (ACUTE ONLY): 25 min   Charges:   PT Evaluation $PT Eval Low Complexity: 1 Low PT Treatments $Therapeutic Activity: 8-22 mins       [name] IV, PT, DPT Physical Therapist- [facility]  06/19/2022, 3:05 PM

## 2022-06-19 NOTE — ED Notes (Signed)
Lab called for blood work

## 2022-06-19 NOTE — ED Notes (Signed)
Patient sat decreased to 88-89% on RA during rest. Patient placed on 2L. Sat is 96% on 2L

## 2022-06-19 NOTE — ED Notes (Signed)
Lab called again for bloodwork

## 2022-06-19 NOTE — Consult Note (Signed)
Pharmacy Antibiotic Note  [name] is a 87 y.o. male admitted on 06/17/2022 with pneumonia.  Pharmacy has been consulted for cefepime and vancomycin dosing. Pt was admitted to hospital between 12/18 and 12/22.   Plan: Continue Cefepime 2 g q12H  Continue flagyl 500 mg BID.  Vancomycin stopped 12/26  Height: 6\' 2"  (188 cm) Weight: 58.3 kg (128 lb 8.5 oz) IBW/kg (Calculated) : 82.2  Temp (24hrs), Avg:97.8 F (36.6 C), Min:97.7 F (36.5 C), Max:97.9 F (36.6 C)  Recent Labs  Lab 06/14/22 0404 06/17/22 1043 06/17/22 1300 06/17/22 1438 06/17/22 1859 06/18/22 0500 06/19/22 0510 06/19/22 0608  WBC 8.3 9.3  --   --   --  6.9 10.6*  --   CREATININE 1.22 1.37*  --   --   --  1.10  --  1.11  LATICACIDVEN  --  2.0* 2.4* 2.0* 1.6  --   --   --      Estimated Creatinine Clearance: 40.1 mL/min (by C-G formula based on SCr of 1.11 mg/dL).    Allergies  Allergen Reactions   Celebrex [Celecoxib] Rash and Other (See Comments)    Blisters, also   Tape Other (See Comments)    EKG leads leave red marks and PLEASE USE COBAN WRAP!! Skin tears and bruises easily    Antimicrobials this admission: 12/25 cefepime >>  12/25 vancomycin >> 12/26 12/25 flagyl >>    Dose adjustments this admission: None  Microbiology results: 12/25 BCx: ngtd 12/25 MRSA PCR: negative.   Thank you for allowing pharmacy to be a part of this patient's care.  Lorin Picket, PharmD 06/19/2022 10:55 AM

## 2022-06-20 ENCOUNTER — Inpatient Hospital Stay: Payer: Medicare HMO

## 2022-06-20 DIAGNOSIS — J9621 Acute and chronic respiratory failure with hypoxia: Secondary | ICD-10-CM | POA: Diagnosis not present

## 2022-06-20 DIAGNOSIS — J189 Pneumonia, unspecified organism: Secondary | ICD-10-CM | POA: Diagnosis not present

## 2022-06-20 DIAGNOSIS — A419 Sepsis, unspecified organism: Secondary | ICD-10-CM | POA: Diagnosis not present

## 2022-06-20 DIAGNOSIS — J441 Chronic obstructive pulmonary disease with (acute) exacerbation: Secondary | ICD-10-CM | POA: Diagnosis not present

## 2022-06-20 LAB — CBC WITH DIFFERENTIAL/PLATELET
Abs Immature Granulocytes: 0.56 10*3/uL — ABNORMAL HIGH (ref 0.00–0.07)
Basophils Absolute: 0 10*3/uL (ref 0.0–0.1)
Basophils Relative: 0 %
Eosinophils Absolute: 0 10*3/uL (ref 0.0–0.5)
Eosinophils Relative: 0 %
HCT: 31.6 % — ABNORMAL LOW (ref 39.0–52.0)
Hemoglobin: 10.5 g/dL — ABNORMAL LOW (ref 13.0–17.0)
Immature Granulocytes: 4 %
Lymphocytes Relative: 10 %
Lymphs Abs: 1.5 10*3/uL (ref 0.7–4.0)
MCH: 28.8 pg (ref 26.0–34.0)
MCHC: 33.2 g/dL (ref 30.0–36.0)
MCV: 86.6 fL (ref 80.0–100.0)
Monocytes Absolute: 1.7 10*3/uL — ABNORMAL HIGH (ref 0.1–1.0)
Monocytes Relative: 12 %
Neutro Abs: 10.4 10*3/uL — ABNORMAL HIGH (ref 1.7–7.7)
Neutrophils Relative %: 74 %
Platelets: 346 10*3/uL (ref 150–400)
RBC: 3.65 MIL/uL — ABNORMAL LOW (ref 4.22–5.81)
RDW: 16.6 % — ABNORMAL HIGH (ref 11.5–15.5)
WBC: 14.1 10*3/uL — ABNORMAL HIGH (ref 4.0–10.5)
nRBC: 0 % (ref 0.0–0.2)

## 2022-06-20 LAB — GLUCOSE, CAPILLARY
Glucose-Capillary: 191 mg/dL — ABNORMAL HIGH (ref 70–99)
Glucose-Capillary: 183 mg/dL — ABNORMAL HIGH (ref 70–99)
Glucose-Capillary: 230 mg/dL — ABNORMAL HIGH (ref 70–99)
Glucose-Capillary: 217 mg/dL — ABNORMAL HIGH (ref 70–99)

## 2022-06-20 LAB — TYPE AND SCREEN
ABO/RH(D): O NEG
Antibody Screen: NEGATIVE
Unit division: 0

## 2022-06-20 LAB — BPAM RBC
Blood Product Expiration Date: 202401312359
ISSUE DATE / TIME: 202312271258
Unit Type and Rh: 5100

## 2022-06-20 LAB — BRAIN NATRIURETIC PEPTIDE: B Natriuretic Peptide: 741 pg/mL — ABNORMAL HIGH (ref 0.0–100.0)

## 2022-06-20 LAB — TROPONIN I (HIGH SENSITIVITY)
Troponin I (High Sensitivity): 19 ng/L — ABNORMAL HIGH (ref <18)
Troponin I (High Sensitivity): 23 ng/L — ABNORMAL HIGH (ref <18)

## 2022-06-20 MED ORDER — PREDNISONE 20 MG PO TABS
30.0000 mg | ORAL_TABLET | Freq: Every day | ORAL | Status: AC
  Administered 2022-06-21 – 2022-06-22 (×2): 30 mg via ORAL
  Filled 2022-06-20 (×2): qty 1

## 2022-06-20 MED ORDER — ORAL CARE MOUTH RINSE
15.0000 mL | OROMUCOSAL | Status: DC
  Administered 2022-06-20 – 2022-06-28 (×27): 15 mL via OROMUCOSAL

## 2022-06-20 MED ORDER — INSULIN ASPART PROT & ASPART (70-30 MIX) 100 UNIT/ML ~~LOC~~ SUSP
14.0000 [IU] | Freq: Every day | SUBCUTANEOUS | Status: DC
  Administered 2022-06-20 – 2022-06-21 (×2): 14 [IU] via SUBCUTANEOUS

## 2022-06-20 MED ORDER — MORPHINE SULFATE (PF) 2 MG/ML IV SOLN
2.0000 mg | Freq: Once | INTRAVENOUS | Status: AC
  Administered 2022-06-20: 2 mg via INTRAVENOUS
  Filled 2022-06-20: qty 1

## 2022-06-20 MED ORDER — ORAL CARE MOUTH RINSE: 15.0000 mL | OROMUCOSAL | Status: DC | PRN

## 2022-06-20 MED ORDER — FUROSEMIDE 10 MG/ML IJ SOLN
20.0000 mg | Freq: Once | INTRAMUSCULAR | Status: AC
  Administered 2022-06-20: 20 mg via INTRAVENOUS
  Filled 2022-06-20: qty 2

## 2022-06-20 MED ORDER — ADULT MULTIVITAMIN W/MINERALS CH
1.0000 | ORAL_TABLET | Freq: Every day | ORAL | Status: DC
  Administered 2022-06-20 – 2022-06-27 (×7): 1 via ORAL
  Filled 2022-06-20 (×7): qty 1

## 2022-06-20 MED ORDER — FUROSEMIDE 10 MG/ML IJ SOLN
40.0000 mg | Freq: Once | INTRAMUSCULAR | Status: AC
  Administered 2022-06-20: 40 mg via INTRAVENOUS
  Filled 2022-06-20: qty 4

## 2022-06-20 MED ORDER — ENSURE ENLIVE PO LIQD
237.0000 mL | Freq: Three times a day (TID) | ORAL | Status: DC
  Administered 2022-06-20 – 2022-06-27 (×11): 237 mL via ORAL

## 2022-06-20 NOTE — Progress Notes (Signed)
Initial Nutrition Assessment  DOCUMENTATION CODES:   Underweight, Severe malnutrition in context of chronic illness  INTERVENTION:   -MVI with minerals daily -Ensure Enlive po TID, each supplement provides 350 kcal and 20 grams of protein.  -Magic cup TID with meals, each supplement provides 290 kcal and 9 grams of protein  -Liberalize diet to regular for wider variety of meal selections  NUTRITION DIAGNOSIS:   Severe Malnutrition related to chronic illness (COPD) as evidenced by severe fat depletion, severe muscle depletion.  GOAL:   Patient will meet greater than or equal to 90% of their needs  MONITOR:   PO intake, Supplement acceptance  REASON FOR ASSESSMENT:   Consult Assessment of nutrition requirement/status, Poor PO  ASSESSMENT:   Pt with medical history significant for recent discharge from the hospital on 06/14/2022 for community-acquired pneumonia, CAD s/p CABG, hypertension, hyperlipidemia, diabetes mellitus, COPD, chronic hypoxic respiratory failure on 2 L/min oxygen at night, severe AS, depression with anxiety, tobacco abuse, A flutter/A-fib, CKD-3A, DVT, aspiration pneumonia, BPH, who presented to the hospital with generalized weakness, low back pain, shortness of breath and dry cough.  Pt admitted with severe sepsis secondary to bilateral pneumonia.   Reviewed I/O's: +1.7 L x 24 hours and +415 ml since admission  UOP: 3.6 L x 24 hours   Spoke with pt at bedside, who was very hard of hearing. He reports fair appetite. He consumed 100% of Ensure supplement and a few bites of spaghetti for lunch. Noted pt also consumed about 25% of a sausage biscuit from [employer] [place] that was brought in by family. Pt shares that family has been bringing in outside food for pt to consume. Pt shares he usually consumes two Ensure shakes at home and enjoys these.    Pt endorses wt loss, but unsure of UBW or how much weight he has lost. Reviewed wt hx; pt has experienced a 6.1% wt  loss over the past 11 months, which is not significant for time frame.   Discussed importance of good meal and supplement intake to promote healing. Pt amenable to supplements. Pt with poor oral intake and would benefit from nutrient dense supplement. One Ensure Enlive supplement provides 350 kcals, 20 grams protein, and 44-45 grams of carbohydrate vs one Glucerna shake supplement, which provides 220 kcals, 10 grams of protein, and 26 grams of carbohydrate. Given pt's hx of DM, RD will reassess adequacy of PO intake, CBGS, and adjust supplement regimen as appropriate at follow-up.    Medications reviewed and include prednisone.   Per therapy notes, plan for home with home health once medically stable.   Lab Results  Component Value Date   HGBA1C 7.9 (H) 06/10/2022   PTA DM medications are 8 units insulin aspart protamine-aspart daily and 12 units insulin aspart protamine-aspart daily.   Labs reviewed: CBGS: 183-230 (inpatient orders for glycemic control are 0-15 units insulin aspart TID with meals, 0-5 units insulin aspart daily at bedtime, 10 units, and 14 units insulin aspart protamine-aspart daily).    NUTRITION - FOCUSED PHYSICAL EXAM:  Flowsheet Row Most Recent Value  Orbital Region Severe depletion  Upper Arm Region Severe depletion  Thoracic and Lumbar Region Severe depletion  Buccal Region Severe depletion  Temple Region Severe depletion  Clavicle Bone Region Severe depletion  Clavicle and Acromion Bone Region Severe depletion  Scapular Bone Region Severe depletion  Dorsal Hand Severe depletion  Patellar Region Severe depletion  Anterior Thigh Region Severe depletion  Posterior Calf Region Severe depletion  Edema (  RD Assessment) None  Hair Reviewed  Eyes Reviewed  Mouth Reviewed  Skin Reviewed  Nails Reviewed       Diet Order:   Diet Order             Diet regular Room service appropriate? Yes; Fluid consistency: Thin  Diet effective now                    EDUCATION NEEDS:   Education needs have been addressed  Skin:  Skin Assessment: Skin Integrity Issues: Skin Integrity Issues:: Stage II, Other (Comment) Stage II: sacrum Other: lacerations to rt jae and lt face  Last BM:  06/19/22  Height:   Ht Readings from Last 1 Encounters:  06/17/22 6\' 2"  (1.88 m)    Weight:   Wt Readings from Last 1 Encounters:  06/17/22 58.3 kg    Ideal Body Weight:  86.4 kg  BMI:  Body mass index is 16.5 kg/m.  Estimated Nutritional Needs:   Kcal:  2100-2300  Protein:  105-120 grams  Fluid:  > 2 L    Loistine Chance, RD, LDN, [place] Registered Dietitian II Certified Diabetes Care and Education Specialist Please refer to [facility] for RD and/or RD on-call/weekend/after hours pager

## 2022-06-20 NOTE — Inpatient Diabetes Management (Signed)
Inpatient Diabetes Program Recommendations  AACE/ADA: New Consensus Statement on Inpatient Glycemic Control   Target Ranges:  Prepandial:   less than 140 mg/dL      Peak postprandial:   less than 180 mg/dL (1-2 hours)      Critically ill patients:  140 - 180 mg/dL    Latest Reference Range & Units 06/19/22 07:44 06/19/22 12:43 06/19/22 16:51 06/19/22 21:47 06/20/22 08:23  Glucose-Capillary 70 - 99 mg/dL 139 (H) 255 (H) 348 (H) 386 (H) 191 (H)   Review of Glycemic Control  Diabetes history: DM2 Outpatient Diabetes medications: 70/30 8 units QAM, 70/30 12 units QPM Current orders for Inpatient glycemic control: 70/30 10 units QAM, 70/30 12 units QPM, Novolog 0-15 units TID with meals, Novolog 0-5 units QHS; Prednisone 40 mg QAM  Inpatient Diabetes Program Recommendations:    Insulin: If steroids are continued as ordered, please consider increasing 70/30 to 13 units QAM and 70/30 14 units QPM.  Thanks, Barnie Alderman, RN, MSN, [place] Diabetes Coordinator Inpatient Diabetes Program [phone] (Team Pager from 8am to [place])

## 2022-06-20 NOTE — Progress Notes (Addendum)
Progress Note    [name]  XIP:[id number] DOB: 02-06-1935  DOA: 06/17/2022 PCP: Susy Frizzle, MD      Brief Narrative:    Medical records reviewed and are as summarized below:  [name] is a 87 y.o. male with medical history significant for recent discharge from the hospital on 06/14/2022 for community-acquired pneumonia, CAD s/p CABG, hypertension, hyperlipidemia, diabetes mellitus, COPD, chronic hypoxic respiratory failure on 2 L/min oxygen at night, severe AS, depression with anxiety, tobacco abuse, A flutter/A-fib, CKD-3A, DVT, aspiration pneumonia, BPH, who presented to the hospital with generalized weakness, low back pain, shortness of breath and dry cough. He was hypotensive in the ED with BP of 81/43.  He was found to have severe sepsis secondary to bilateral pneumonia and COPD exacerbation.  He was treated with empiric IV antibiotics, steroids, IV fluids and bronchodilators.    Assessment/Plan:   Principal Problem:   Bilateral pneumonia Active Problems:   COPD exacerbation (HCC)   Acute and chronic respiratory failure with hypoxia (HCC)   Severe sepsis (HCC)   Essential hypertension   Chronic kidney disease, stage 3a (HCC)   Type II diabetes mellitus with renal manifestations (HCC)   Back pain   BPH (benign prostatic hyperplasia)   Paroxysmal atrial fibrillation (HCC)   Cerebrovascular disease   Cigarette smoker   Depression with anxiety   Protein-calorie malnutrition, severe   Body mass index is 16.5 kg/m.  (Underweight)    Severe sepsis secondary to bilateral pneumonia: Lactic acid was 2.4.  Continue IV cefepime and Flagyl. Recent discharge from the hospital for pneumonia on 06/14/2022.   Elevated BNP (741), shortness of breath, probable fluid overload: Discontinue IV fluids.  Continue IV Lasix.  Obtain 2D echo for further evaluation.   Acute on chronic anemia: Hemoglobin improved from 7.1-10.5.  S/p transfusion of 1 unit of  PRBCs on 06/19/2022.    Acute on chronic hypoxic respiratory failure: He is on 4 L/min oxygen via nasal cannula.  He uses 2 L/min oxygen at night.  Taper down oxygen as able.   COPD exacerbation, chronic hypoxic respiratory failure: Continue prednisone and bronchodilators.   Insulin-dependent diabetes mellitus with hyperglycemia: Continue Novolin (increase evening dose from 12 to 14 units) and NovoLog as needed.   Low back pain: CT lumbar spine did not show any acute fracture or abnormality.   Hypotension: Resolved  Other comorbidities include history of stroke, CAD s/p CABG, paroxysmal atrial fibrillation on aspirin, tobacco use disorder, depression, anxiety, CKD stage III BPH, right upper lung nodule    Diet Order             Diet heart healthy/carb modified Room service appropriate? Yes; Fluid consistency: Thin  Diet effective now                            Consultants: None  Procedures: None    Medications:    aspirin  325 mg Oral Daily   enoxaparin (LOVENOX) injection  40 mg Subcutaneous Q24H   feeding supplement  237 mL Oral BID BM   insulin aspart  0-15 Units Subcutaneous TID WC   insulin aspart  0-5 Units Subcutaneous QHS   insulin aspart protamine- aspart  10 Units Subcutaneous Q breakfast   insulin aspart protamine- aspart  14 Units Subcutaneous Q supper   ipratropium-albuterol  3 mL Nebulization TID   nicotine  21 mg Transdermal Daily   mouth rinse  15 mL Mouth Rinse 4 times per day   pantoprazole  40 mg Oral Daily   pravastatin  40 mg Oral QHS   [START ON 06/21/2022] predniSONE  30 mg Oral Q breakfast   tamsulosin  0.4 mg Oral Daily   Continuous Infusions:  ceFEPime (MAXIPIME) IV 2 g (06/20/22 0901)   metronidazole 500 mg (06/20/22 0518)     Anti-infectives (From admission, onward)    Start     Dose/Rate Route Frequency Ordered Stop   06/18/22 1200  vancomycin (VANCOCIN) 750 mg in sodium chloride 0.9 % 250 mL IVPB  Status:   Discontinued        750 mg 250 mL/hr over 60 Minutes Intravenous Every 24 hours 06/17/22 1628 06/18/22 0533   06/18/22 1200  vancomycin (VANCOREADY) IVPB 750 mg/150 mL  Status:  Discontinued        750 mg 150 mL/hr over 60 Minutes Intravenous Every 24 hours 06/18/22 0533 06/18/22 1125   06/17/22 2200  ceFEPIme (MAXIPIME) 2 g in sodium chloride 0.9 % 100 mL IVPB        2 g 200 mL/hr over 30 Minutes Intravenous Every 12 hours 06/17/22 1628     06/17/22 1600  metroNIDAZOLE (FLAGYL) IVPB 500 mg        500 mg 100 mL/hr over 60 Minutes Intravenous Every 12 hours 06/17/22 1553     06/17/22 1200  vancomycin (VANCOCIN) IVPB 1000 mg/200 mL premix        1,000 mg 200 mL/hr over 60 Minutes Intravenous  Once 06/17/22 1154 06/17/22 1448   06/17/22 1200  ceFEPIme (MAXIPIME) 2 g in sodium chloride 0.9 % 100 mL IVPB        2 g 200 mL/hr over 30 Minutes Intravenous  Once 06/17/22 1154 06/17/22 1330              Family Communication/Anticipated D/C date and plan/Code Status   DVT prophylaxis: enoxaparin (LOVENOX) injection 40 mg Start: 06/17/22 2200     Code Status: DNR  Family Communication: Courtney, daughter-in-law, at the bedside Disposition Plan: Plan to discharge home in 1 to 2 days   Status is: Inpatient Remains inpatient appropriate because: Recurrent pneumonia       Subjective:   Interval events noted.  Overnight, patient had worsening shortness of breath and complained of chest pain.  He was given 1 dose of IV Lasix.  He feels better.  No chest pain currently.  Breathing is a little better.  Loma Sousa, daughter-in-law, was at the bedside  Objective:    Vitals:   06/20/22 0105 06/20/22 0145 06/20/22 0823 06/20/22 1159  BP: (!) 146/62 (!) 159/65 (!) 155/61 126/61  Pulse: 68  79 90  Resp: (!) 25  16 16   Temp: 98.9 F (37.2 C)  97.9 F (36.6 C) 98.2 F (36.8 C)  TempSrc: Oral   Oral  SpO2: 93%  95% 91%  Weight:      Height:       No data  found.   Intake/Output Summary (Last 24 hours) at 06/20/2022 1204 Last data filed at 06/20/2022 1204 Gross per 24 hour  Intake 3394.74 ml  Output 5400 ml  Net -2005.26 ml   Filed Weights   06/17/22 0958  Weight: 58.3 kg    Exam:  GEN: NAD SKIN: Warm and dry EYES: No pallor or icterus ENT: MMM, hearing impairment CV: RRR PULM: Bibasilar rales, no wheezing ABD: soft, ND, NT, +BS CNS: AAO x 3, non focal EXT: No edema  or tenderness      Pressure Injury 06/11/22 Sacrum Mid Stage 2 -  Partial thickness loss of dermis presenting as a shallow open injury with a red, pink wound bed without slough. (Active)  06/11/22 2300  Location: Sacrum  Location Orientation: Mid  Staging: Stage 2 -  Partial thickness loss of dermis presenting as a shallow open injury with a red, pink wound bed without slough.  Wound Description (Comments):   Present on Admission: Yes  Dressing Type Foam - Lift dressing to assess site every shift 06/19/22 1648     Data Reviewed:   I have personally reviewed following labs and imaging studies:  Labs: Labs show the following:   Basic Metabolic Panel: Recent Labs  Lab 06/14/22 0404 06/17/22 1043 06/18/22 0500 06/19/22 0608  NA 136 138 137 141  K 3.7 4.1 4.4 4.1  CL 105 104 109 112*  CO2 23 21* 19* 23  GLUCOSE 167* 154* 206* 138*  BUN 27* 31* 26* 29*  CREATININE 1.22 1.37* 1.10 1.11  CALCIUM 8.1* 8.8* 8.0* 8.2*  MG 1.7  --   --   --    GFR Estimated Creatinine Clearance: 40.1 mL/min (by C-G formula based on SCr of 1.11 mg/dL). Liver Function Tests: Recent Labs  Lab 06/17/22 1043  AST 35  ALT 23  ALKPHOS 55  BILITOT 1.2  PROT 7.6  ALBUMIN 2.9*   No results for input(s): "LIPASE", "AMYLASE" in the last 168 hours. No results for input(s): "AMMONIA" in the last 168 hours. Coagulation profile No results for input(s): "INR", "PROTIME" in the last 168 hours.  CBC: Recent Labs  Lab 06/14/22 0404 06/17/22 1043 06/18/22 0500  06/19/22 0510 06/20/22 0214  WBC 8.3 9.3 6.9 10.6* 14.1*  NEUTROABS  --  6.0  --  8.0* 10.4*  HGB 9.5* 10.7* 8.7* 7.1* 10.5*  HCT 27.5* 33.4* 26.7* 22.1* 31.6*  MCV 85.4 87.9 87.5 89.5 86.6  PLT 155 400 327 267 346   Cardiac Enzymes: Recent Labs  Lab 06/14/22 0404  CKTOTAL 70   BNP (last 3 results) No results for input(s): "PROBNP" in the last 8760 hours. CBG: Recent Labs  Lab 06/19/22 1243 06/19/22 1651 06/19/22 2147 06/20/22 0823 06/20/22 1202  GLUCAP 255* 348* 386* 191* 183*   D-Dimer: No results for input(s): "DDIMER" in the last 72 hours. Hgb A1c: No results for input(s): "HGBA1C" in the last 72 hours. Lipid Profile: No results for input(s): "CHOL", "HDL", "LDLCALC", "TRIG", "CHOLHDL", "LDLDIRECT" in the last 72 hours. Thyroid function studies: No results for input(s): "TSH", "T4TOTAL", "T3FREE", "THYROIDAB" in the last 72 hours.  Invalid input(s): "FREET3" Anemia work up: No results for input(s): "VITAMINB12", "FOLATE", "FERRITIN", "TIBC", "IRON", "RETICCTPCT" in the last 72 hours. Sepsis Labs: Recent Labs  Lab 06/17/22 1043 06/17/22 1300 06/17/22 1438 06/17/22 1859 06/18/22 0500 06/19/22 0510 06/20/22 0214  WBC 9.3  --   --   --  6.9 10.6* 14.1*  LATICACIDVEN 2.0* 2.4* 2.0* 1.6  --   --   --     Microbiology Recent Results (from the past 240 hour(s))  Resp panel by RT-PCR (RSV, Flu A&B, Covid) Anterior Nasal Swab     Status: None   Collection Time: 06/17/22 10:40 AM   Specimen: Anterior Nasal Swab  Result Value Ref Range Status   SARS Coronavirus 2 by RT PCR NEGATIVE NEGATIVE Final    Comment: (NOTE) SARS-CoV-2 target nucleic acids are NOT DETECTED.  The SARS-CoV-2 RNA is generally detectable in upper respiratory  specimens during the acute phase of infection. The lowest concentration of SARS-CoV-2 viral copies this assay can detect is 138 copies/mL. A negative result does not preclude SARS-Cov-2 infection and should not be used as the sole  basis for treatment or other patient management decisions. A negative result may occur with  improper specimen collection/handling, submission of specimen other than nasopharyngeal swab, presence of viral mutation(s) within the areas targeted by this assay, and inadequate number of viral copies(<138 copies/mL). A negative result must be combined with clinical observations, patient history, and epidemiological information. The expected result is Negative.  Fact Sheet for Patients:  EntrepreneurPulse.com.au  Fact Sheet for Healthcare Providers:  IncredibleEmployment.be  This test is no t yet approved or cleared by the [place] FDA and  has been authorized for detection and/or diagnosis of SARS-CoV-2 by FDA under an Emergency Use Authorization (EUA). This EUA will remain  in effect (meaning this test can be used) for the duration of the COVID-19 declaration under Section 564(b)(1) of the Act, 21 U.S.C.section 360bbb-3(b)(1), unless the authorization is terminated  or revoked sooner.       Influenza A by PCR NEGATIVE NEGATIVE Final   Influenza B by PCR NEGATIVE NEGATIVE Final    Comment: (NOTE) The Xpert Xpress SARS-CoV-2/FLU/RSV plus assay is intended as an aid in the diagnosis of influenza from Nasopharyngeal swab specimens and should not be used as a sole basis for treatment. Nasal washings and aspirates are unacceptable for Xpert Xpress SARS-CoV-2/FLU/RSV testing.  Fact Sheet for Patients: EntrepreneurPulse.com.au  Fact Sheet for Healthcare Providers: IncredibleEmployment.be  This test is not yet approved or cleared by the [place] FDA and has been authorized for detection and/or diagnosis of SARS-CoV-2 by FDA under an Emergency Use Authorization (EUA). This EUA will remain in effect (meaning this test can be used) for the duration of the COVID-19 declaration under Section 564(b)(1) of the Act,  21 U.S.C. section 360bbb-3(b)(1), unless the authorization is terminated or revoked.     Resp Syncytial Virus by PCR NEGATIVE NEGATIVE Final    Comment: (NOTE) Fact Sheet for Patients: EntrepreneurPulse.com.au  Fact Sheet for Healthcare Providers: IncredibleEmployment.be  This test is not yet approved or cleared by the [place] FDA and has been authorized for detection and/or diagnosis of SARS-CoV-2 by FDA under an Emergency Use Authorization (EUA). This EUA will remain in effect (meaning this test can be used) for the duration of the COVID-19 declaration under Section 564(b)(1) of the Act, 21 U.S.C. section 360bbb-3(b)(1), unless the authorization is terminated or revoked.  Performed at [facility], [address] [place], [place] [address]   Culture, blood (routine x 2)     Status: None (Preliminary result)   Collection Time: 06/17/22 10:43 AM   Specimen: BLOOD RIGHT FOREARM  Result Value Ref Range Status   Specimen Description BLOOD RIGHT FOREARM  Final   Special Requests   Final    BOTTLES DRAWN AEROBIC AND ANAEROBIC Blood Culture adequate volume   Culture   Final    NO GROWTH 3 DAYS Performed at [facility], [address], [place], [place] [address]    Report Status PENDING  Incomplete  Culture, blood (routine x 2)     Status: None (Preliminary result)   Collection Time: 06/17/22 10:43 AM   Specimen: BLOOD RIGHT HAND  Result Value Ref Range Status   Specimen Description BLOOD RIGHT HAND  Final   Special Requests   Final    BOTTLES DRAWN AEROBIC AND ANAEROBIC  Blood Culture adequate volume   Culture   Final    NO GROWTH 3 DAYS Performed at [facility], [address] [place], [place] [address]    Report Status PENDING  Incomplete  MRSA Next Gen by PCR, Nasal     Status: None   Collection Time: 06/17/22  6:59 PM   Specimen: Nasal Mucosa; Nasal Swab  Result Value Ref Range Status   MRSA by  PCR Next Gen NOT DETECTED NOT DETECTED Final    Comment: (NOTE) The GeneXpert MRSA Assay (FDA approved for NASAL specimens only), is one component of a comprehensive MRSA colonization surveillance program. It is not intended to diagnose MRSA infection nor to guide or monitor treatment for MRSA infections. Test performance is not FDA approved in patients less than 35 years old. Performed at [facility], [address] [place], [place] [address]     Procedures and diagnostic studies:  DG Chest [facility] 1 View  Result Date: 06/20/2022 CLINICAL DATA:  Dyspnea, chest pain EXAM: PORTABLE CHEST 1 VIEW COMPARISON:  06/17/2022 FINDINGS: The lungs appear hyperinflated in keeping with changes of underlying COPD. Superimposed extensive interstitial and airspace infiltrates have progressed since prior examination in keeping with multifocal pneumonic infiltrate, more prevalent within the right lung base. Small left pleural effusion has developed. Coronary artery bypass grafting has been performed. Cardiac size within normal limits. No acute bone abnormality. IMPRESSION: 1. Progressive, extensive multifocal pneumonic infiltrate, more prevalent within the right lung base. Small left pleural effusion. 2. COPD. Electronically Signed   By: Fidela Salisbury M.D.   On: 06/20/2022 00:31               LOS: 3 days   Chaos Carlile  [facility]   Pager on [email or web address]. If 7PM-7AM, please contact night-coverage at [email or web address]     06/20/2022, 12:04 PM

## 2022-06-20 NOTE — Consult Note (Signed)
   [facility] Inpatient Consult   06/20/2022  ESPN ZEMAN March 19, 1935 [id number]  [place] [facility] [ACO] Patient: [facility] Liaison remote coverage review for patient admitted to [facility]   Primary Care Provider:  Susy Frizzle, MD, [facility]  Patient screened for less than 7 days readmission hospitalization with noted high risk score for unplanned readmission risk  to assess for potential [place] Management service needs for post hospital transition for care coordination.  Review of patient's electronic medical record of PT evaluation reveals patient is from home [son's house] and very HOH. Admitted with bilateral pneumonia.   Plan:  Continue to follow progress and disposition to assess for post hospital community care coordination/management needs.  Referral request for community care coordination: pending disposition and needs  Of note, [place] does not replace or interfere with any arrangements made by the Inpatient Transition of Care team.  For questions contact:   Natividad Brood, RN BSN [place]  [phone] business mobile phone Toll free office [phone]  *[place]  [phone] Fax number: [phone] [email or web address]

## 2022-06-20 NOTE — Care Management Important Message (Signed)
Important Message  Patient Details  Name: JAAZIAH SCHULKE MRN: [id number] Date of Birth: 12-29-1934   Medicare Important Message Given:  Yes     Dannette Barbara 06/20/2022, 12:03 PM

## 2022-06-21 ENCOUNTER — Inpatient Hospital Stay (HOSPITAL_COMMUNITY)
Admit: 2022-06-21 | Discharge: 2022-06-21 | Disposition: A | Discharge status: Home / Self Care | Payer: Medicare HMO | Attending: Internal Medicine | Admitting: Internal Medicine

## 2022-06-21 DIAGNOSIS — J441 Chronic obstructive pulmonary disease with (acute) exacerbation: Secondary | ICD-10-CM | POA: Diagnosis not present

## 2022-06-21 DIAGNOSIS — A419 Sepsis, unspecified organism: Secondary | ICD-10-CM | POA: Diagnosis not present

## 2022-06-21 DIAGNOSIS — R0609 Other forms of dyspnea: Secondary | ICD-10-CM | POA: Diagnosis not present

## 2022-06-21 DIAGNOSIS — I480 Paroxysmal atrial fibrillation: Secondary | ICD-10-CM | POA: Diagnosis not present

## 2022-06-21 DIAGNOSIS — J189 Pneumonia, unspecified organism: Secondary | ICD-10-CM | POA: Diagnosis not present

## 2022-06-21 LAB — CBC WITH DIFFERENTIAL/PLATELET
Abs Immature Granulocytes: 0.19 10*3/uL — ABNORMAL HIGH (ref 0.00–0.07)
Basophils Absolute: 0 10*3/uL (ref 0.0–0.1)
Basophils Relative: 0 %
Eosinophils Absolute: 0 10*3/uL (ref 0.0–0.5)
Eosinophils Relative: 0 %
HCT: 36.6 % — ABNORMAL LOW (ref 39.0–52.0)
Hemoglobin: 12.3 g/dL — ABNORMAL LOW (ref 13.0–17.0)
Immature Granulocytes: 1 %
Lymphocytes Relative: 10 %
Lymphs Abs: 1.6 10*3/uL (ref 0.7–4.0)
MCH: 28.7 pg (ref 26.0–34.0)
MCHC: 33.6 g/dL (ref 30.0–36.0)
MCV: 85.5 fL (ref 80.0–100.0)
Monocytes Absolute: 1.6 10*3/uL — ABNORMAL HIGH (ref 0.1–1.0)
Monocytes Relative: 10 %
Neutro Abs: 12.8 10*3/uL — ABNORMAL HIGH (ref 1.7–7.7)
Neutrophils Relative %: 79 %
Platelets: 387 10*3/uL (ref 150–400)
RBC: 4.28 MIL/uL (ref 4.22–5.81)
RDW: 16.6 % — ABNORMAL HIGH (ref 11.5–15.5)
WBC: 16.2 10*3/uL — ABNORMAL HIGH (ref 4.0–10.5)
nRBC: 0 % (ref 0.0–0.2)

## 2022-06-21 LAB — GLUCOSE, CAPILLARY
Glucose-Capillary: 251 mg/dL — ABNORMAL HIGH (ref 70–99)
Glucose-Capillary: 140 mg/dL — ABNORMAL HIGH (ref 70–99)
Glucose-Capillary: 403 mg/dL — ABNORMAL HIGH (ref 70–99)
Glucose-Capillary: 329 mg/dL — ABNORMAL HIGH (ref 70–99)

## 2022-06-21 LAB — BASIC METABOLIC PANEL
Anion gap: 9 (ref 5–15)
BUN: 36 mg/dL — ABNORMAL HIGH (ref 8–23)
CO2: 27 mmol/L (ref 22–32)
Calcium: 8.4 mg/dL — ABNORMAL LOW (ref 8.9–10.3)
Chloride: 102 mmol/L (ref 98–111)
Creatinine, Ser: 1.09 mg/dL (ref 0.61–1.24)
GFR, Estimated: >60 mL/min (ref >60)
Glucose, Bld: 255 mg/dL — ABNORMAL HIGH (ref 70–99)
Potassium: 3.9 mmol/L (ref 3.5–5.1)
Sodium: 138 mmol/L (ref 135–145)

## 2022-06-21 LAB — ECHOCARDIOGRAM COMPLETE
AR max vel: 3.53 cm2
AV Area VTI: 3.16 cm2
AV Area mean vel: 3.2 cm2
AV Mean grad: 2 mmHg
AV Peak grad: 4 mmHg
Ao pk vel: 1 m/s
Area-P 1/2: 2.6 cm2
Height: 74 in
MV VTI: 3.48 cm2
S' Lateral: 2.3 cm
Weight: 2056.45 oz

## 2022-06-21 LAB — BRAIN NATRIURETIC PEPTIDE: B Natriuretic Peptide: 210.6 pg/mL — ABNORMAL HIGH (ref 0.0–100.0)

## 2022-06-21 LAB — MAGNESIUM: Magnesium: 1.7 mg/dL (ref 1.7–2.4)

## 2022-06-21 MED ORDER — METOPROLOL TARTRATE 5 MG/5ML IV SOLN
INTRAVENOUS | Status: AC
  Administered 2022-06-21: 5 mg via INTRAVENOUS
  Filled 2022-06-21: qty 5

## 2022-06-21 MED ORDER — FUROSEMIDE 10 MG/ML IJ SOLN
40.0000 mg | Freq: Once | INTRAMUSCULAR | Status: AC
  Administered 2022-06-21: 40 mg via INTRAVENOUS
  Filled 2022-06-21: qty 4

## 2022-06-21 MED ORDER — METOPROLOL TARTRATE 5 MG/5ML IV SOLN
5.0000 mg | Freq: Once | INTRAVENOUS | Status: AC
  Administered 2022-06-21: 5 mg via INTRAVENOUS

## 2022-06-21 NOTE — Consult Note (Signed)
Pharmacy Antibiotic Note  [name] is a 87 y.o. male admitted on 06/17/2022 with pneumonia.  Pharmacy has been consulted for cefepime dosing. Pt was admitted to hospital between 12/18 and 12/22.   Plan: Continue cefepime 2 q12H and flagyl 500 mg BID. Total duration of 7 days. End date placed.   Height: 6\' 2"  (188 cm) Weight: 58.3 kg (128 lb 8.5 oz) IBW/kg (Calculated) : 82.2  Temp (24hrs), Avg:98.1 F (36.7 C), Min:97.9 F (36.6 C), Max:98.3 F (36.8 C)  Recent Labs  Lab 06/17/22 1043 06/17/22 1300 06/17/22 1438 06/17/22 1859 06/18/22 0500 06/19/22 0510 06/19/22 0608 06/20/22 0214 06/21/22 0847  WBC 9.3  --   --   --  6.9 10.6*  --  14.1* 16.2*  CREATININE 1.37*  --   --   --  1.10  --  1.11  --  1.09  LATICACIDVEN 2.0* 2.4* 2.0* 1.6  --   --   --   --   --      Estimated Creatinine Clearance: 40.9 mL/min (by C-G formula based on SCr of 1.09 mg/dL).    Allergies  Allergen Reactions   Celebrex [Celecoxib] Rash and Other (See Comments)    Blisters, also   Tape Other (See Comments)    EKG leads leave red marks and PLEASE USE COBAN WRAP!! Skin tears and bruises easily    Antimicrobials this admission: 12/25 cefepime >>  12/25 vancomycin >> 12/26 12/25 flagyl >>    Dose adjustments this admission: None  Microbiology results: 12/25 BCx: ngtd 12/25 MRSA PCR: negative.   Thank you for allowing pharmacy to be a part of this patient's care.  Oswald Hillock, PharmD 06/21/2022 11:23 AM

## 2022-06-21 NOTE — Progress Notes (Signed)
       CROSS COVER NOTE  NAME: [name] MRN: [id number] DOB : [birth year]   HPI/Events of Note   Patient admitted for acute on chronic respiratory failure with COPD exacerbation now in atrial fib with RVR reported from nurse   Assessment and  Interventions   Assessment: Patient with history of paroxysmal a fib on aspirin, no rate controlling meds EKG afib RVR 122; non specific ST T wave changes. No STEMI criteria Plan: Metoprolol 5 mg IV x 1 for heart a fibe RVR rate currently 149 Continue to monitor on [place] NP [facility]

## 2022-06-21 NOTE — Progress Notes (Signed)
Pt converted to Afib in RVR around 0340H, highest noted HR was around 180's bpm. Pt was asymptomatic, denies chest pain or palpitations. HR sustained at 130-140s bpm. NP Randol Kern made aware. 12L EKG done. Metoprolol 5mg  IV given as per order. Pt converted back to normal sinus rhythm around 0500H. Latest HR: 75bpm. Pt appears comfortable, asleep but easily arousable. No complaints at this time.

## 2022-06-21 NOTE — Progress Notes (Signed)
Physical Therapy Treatment Patient Details Name: [name] MRN: [id number] DOB: 1935/02/03 Today's Date: 06/21/2022   History of Present Illness [name] is a 87 y.o. male with medical history significant of hypertension, hyperlipidemia, diabetes mellitus, COPD, severe AS, depression with anxiety, tobacco abuse, A flutter/A-fib, CKD-3A, DVT, aspiration pneumonia, BPH, who presents with shortness breath.    PT Comments    Pt received from OT. Pt sitting upright in reliner agreeable to PT. Pt progressing in mobility perofrming gait to doorway and back at supervision level. Did require min VC"s for hand positioning for STS but otherwise safe use of RW with turning and navigating obstacles in room with step through pattern. Post gait pt with increased SOB with desat to 88% on 4L/min. Improves with PLB education and perofrmance > 90% within 30 seconds. NT present in room to assist pt further. Pt progressing in functional mobility during hospitalization. Continue to rec [facility] PT at discharge due to multiple acute admissions to optimize return to PLOF.     Recommendations for follow up therapy are one component of a multi-disciplinary discharge planning process, led by the attending physician.  Recommendations may be updated based on patient status, additional functional criteria and insurance authorization.  Follow Up Recommendations  Home health PT     Assistance Recommended at Discharge Frequent or constant Supervision/Assistance  Patient can return home with the following A little help with walking and/or transfers;Assistance with cooking/housework;Assist for transportation;A little help with bathing/dressing/bathroom;Help with stairs or ramp for entrance   Equipment Recommendations  Other (comment) (no family to assist in info gathering. Pt has RW so unlikely pt will have DME needs.)    Recommendations for Other Services       Precautions / Restrictions  Precautions Precautions: Fall Precaution Comments: Very HOH Restrictions Weight Bearing Restrictions: No     Mobility  Bed Mobility               General bed mobility comments: NT. In recliner pre and post testing Patient Response: Cooperative  Transfers Overall transfer level: Needs assistance Equipment used: None Transfers: Sit to/from Stand Sit to Stand: Supervision                Ambulation/Gait Ambulation/Gait assistance: Supervision Gait Distance (Feet): 40 Feet Assistive device: Rolling walker (2 wheels) Gait Pattern/deviations: Step-through pattern, Decreased stride length, Narrow base of support       General Gait Details: performing gait bout in room with RW and consistent gait cadence   Stairs             Wheelchair Mobility    Modified Rankin (Stroke Patients Only)       Balance Overall balance assessment: Needs assistance Sitting-balance support: Bilateral upper extremity supported, Feet supported Sitting balance-Leahy Scale: Good     Standing balance support: No upper extremity supported, During functional activity Standing balance-Leahy Scale: Fair                              Cognition Arousal/Alertness: Awake/alert Behavior During Therapy: WFL for tasks assessed/performed Overall Cognitive Status: Difficult to assess                                          Exercises      General Comments General comments (skin integrity, edema, etc.): post gait desat to 88%.  Improves with PLB > 90% within 30s econds.      Pertinent Vitals/Pain Pain Assessment Pain Assessment: No/denies pain    Home Living                          Prior Function            PT Goals (current goals can now be found in the care plan section) Acute Rehab PT Goals Patient Stated Goal: improve breathing, go home PT Goal Formulation: With patient Time For Goal Achievement: 07/03/22 Potential to Achieve  Goals: Good Progress towards PT goals: Progressing toward goals    Frequency    Min 2X/week      PT Plan Current plan remains appropriate    Co-evaluation              AM-PAC PT "6 Clicks" Mobility   Outcome Measure  Help needed turning from your back to your side while in a flat bed without using bedrails?: A Little Help needed moving from lying on your back to sitting on the side of a flat bed without using bedrails?: A Little Help needed moving to and from a bed to a chair (including a wheelchair)?: A Little Help needed standing up from a chair using your arms (e.g., wheelchair or bedside chair)?: A Little Help needed to walk in hospital room?: A Little Help needed climbing 3-5 steps with a railing? : A Lot 6 Click Score: 17    End of Session Equipment Utilized During Treatment: Gait belt;Oxygen Activity Tolerance: Patient tolerated treatment well Patient left: in chair;with call bell/phone within reach;with chair alarm set;with nursing/sitter in room Nurse Communication: Mobility status PT Visit Diagnosis: Other abnormalities of gait and mobility (R26.89)     Time: 7893-8101 PT Time Calculation (min) (ACUTE ONLY): 9 min  Charges:  $Therapeutic Exercise: 8-22 mins           Salem Caster. Fairly IV, PT, DPT Physical Therapist- [facility]  06/21/2022, 1:19 PM

## 2022-06-21 NOTE — Progress Notes (Signed)
Progress Note    [name]  UJW:[id number] DOB: Mar 07, 1935  DOA: 06/17/2022 PCP: Susy Frizzle, MD      Brief Narrative:    Medical records reviewed and are as summarized below:  [name] is a 87 y.o. male with medical history significant for recent discharge from the hospital on 06/14/2022 for community-acquired pneumonia, CAD s/p CABG, hypertension, hyperlipidemia, diabetes mellitus, COPD, chronic hypoxic respiratory failure on 2 L/min oxygen at night, severe AS, depression with anxiety, tobacco abuse, A flutter/A-fib, CKD-3A, DVT, aspiration pneumonia, BPH, who presented to the hospital with generalized weakness, low back pain, shortness of breath and dry cough. He was hypotensive in the ED with BP of 81/43.  He was found to have severe sepsis secondary to bilateral pneumonia and COPD exacerbation.  He was treated with empiric IV antibiotics, steroids, IV fluids and bronchodilators.    Assessment/Plan:   Principal Problem:   Bilateral pneumonia Active Problems:   COPD exacerbation (HCC)   Acute and chronic respiratory failure with hypoxia (HCC)   Severe sepsis (HCC)   Essential hypertension   Chronic kidney disease, stage 3a (HCC)   Type II diabetes mellitus with renal manifestations (HCC)   Back pain   BPH (benign prostatic hyperplasia)   Paroxysmal atrial fibrillation (HCC)   Cerebrovascular disease   Cigarette smoker   Depression with anxiety   Protein-calorie malnutrition, severe   Body mass index is 16.5 kg/m.  (Underweight)   Severe sepsis secondary to bilateral pneumonia: Lactic acid was 2.4.  Continue IV cefepime and Flagyl. Recent discharge from the hospital for pneumonia on 06/14/2022.   Elevated BNP (741), shortness of breath, probable fluid overload: Continue IV Lasix.  2D echo showed EF estimated at 60 to 65%, mild LVH, normal LV diastolic parameters.   Paroxysmal atrial fibrillation with RVR: He converted to normal sinus  rhythm.  He has a history of atrial fibrillation.  Discussed management of atrial fibrillation including anticoagulation for stroke prophylaxis.  Granddaughter said he sees Dr. Stanford Breed, cardiologist, as an outpatient.  They prefer to follow-up with him for further management.     Acute on chronic anemia: Hemoglobin improved from 7.1-10.5.  S/p transfusion of 1 unit of PRBCs on 06/19/2022.    Acute on chronic hypoxic respiratory failure: Oxygen therapy is down from 4 L/min to 3 L/min.  Taper down oxygen as able.  He uses 2 L/min oxygen at night.   COPD exacerbation, chronic hypoxic respiratory failure: Continue prednisone and bronchodilators.   Insulin-dependent diabetes mellitus with hyperglycemia: Continue Novolin and NovoLog as needed.   Low back pain: CT lumbar spine did not show any acute fracture or abnormality.   Hypotension: Resolved  Other comorbidities include history of stroke, CAD s/p CABG, paroxysmal atrial fibrillation on aspirin, tobacco use disorder, depression, anxiety, CKD stage III BPH, right upper lung nodule    Diet Order             Diet regular Room service appropriate? Yes; Fluid consistency: Thin  Diet effective now                            Consultants: None  Procedures: None    Medications:    aspirin  325 mg Oral Daily   enoxaparin (LOVENOX) injection  40 mg Subcutaneous Q24H   feeding supplement  237 mL Oral TID BM   insulin aspart  0-15 Units Subcutaneous TID WC   insulin aspart  0-5 Units Subcutaneous QHS   insulin aspart protamine- aspart  10 Units Subcutaneous Q breakfast   insulin aspart protamine- aspart  14 Units Subcutaneous Q supper   ipratropium-albuterol  3 mL Nebulization TID   multivitamin with minerals  1 tablet Oral Daily   nicotine  21 mg Transdermal Daily   mouth rinse  15 mL Mouth Rinse 4 times per day   pantoprazole  40 mg Oral Daily   pravastatin  40 mg Oral QHS   predniSONE  30 mg Oral Q breakfast    tamsulosin  0.4 mg Oral Daily   Continuous Infusions:  ceFEPime (MAXIPIME) IV 2 g (06/21/22 0844)   metronidazole 500 mg (06/21/22 0453)     Anti-infectives (From admission, onward)    Start     Dose/Rate Route Frequency Ordered Stop   06/18/22 1200  vancomycin (VANCOCIN) 750 mg in sodium chloride 0.9 % 250 mL IVPB  Status:  Discontinued        750 mg 250 mL/hr over 60 Minutes Intravenous Every 24 hours 06/17/22 1628 06/18/22 0533   06/18/22 1200  vancomycin (VANCOREADY) IVPB 750 mg/150 mL  Status:  Discontinued        750 mg 150 mL/hr over 60 Minutes Intravenous Every 24 hours 06/18/22 0533 06/18/22 1125   06/17/22 2200  ceFEPIme (MAXIPIME) 2 g in sodium chloride 0.9 % 100 mL IVPB        2 g 200 mL/hr over 30 Minutes Intravenous Every 12 hours 06/17/22 1628     06/17/22 1600  metroNIDAZOLE (FLAGYL) IVPB 500 mg        500 mg 100 mL/hr over 60 Minutes Intravenous Every 12 hours 06/17/22 1553     06/17/22 1200  vancomycin (VANCOCIN) IVPB 1000 mg/200 mL premix        1,000 mg 200 mL/hr over 60 Minutes Intravenous  Once 06/17/22 1154 06/17/22 1448   06/17/22 1200  ceFEPIme (MAXIPIME) 2 g in sodium chloride 0.9 % 100 mL IVPB        2 g 200 mL/hr over 30 Minutes Intravenous  Once 06/17/22 1154 06/17/22 1330              Family Communication/Anticipated D/C date and plan/Code Status   DVT prophylaxis: enoxaparin (LOVENOX) injection 40 mg Start: 06/17/22 2200     Code Status: DNR  Family Communication: Courtney, daughter-in-law, at the bedside Disposition Plan: Plan to discharge home in 1 to 2 days   Status is: Inpatient Remains inpatient appropriate because: Recurrent pneumonia       Subjective:   Interval events noted.  Patient had rapid atrial fibrillation on telemetry overnight.  He said he did not feel anything.  Breathing is a little better.  No chest pain, shortness of breath or palpitations.  He still has a cough.  Daughter-in-law and granddaughter were  at the bedside   Objective:    Vitals:   06/21/22 0351 06/21/22 0408 06/21/22 0506 06/21/22 0824  BP: (!) 150/84 (!) 157/101 133/66 (!) 125/56  Pulse: (!) 146 (!) 118 73 87  Resp: 20 (!) 22 20   Temp:   98 F (36.7 C)   TempSrc:   Oral   SpO2: 95% 95% 92% 95%  Weight:      Height:       No data found.   Intake/Output Summary (Last 24 hours) at 06/21/2022 1003 Last data filed at 06/21/2022 0400 Gross per 24 hour  Intake --  Output 3650 ml  Net -3650  ml   Filed Weights   06/17/22 0958  Weight: 58.3 kg    Exam:  GEN: NAD SKIN: Warm and dry EYES: No pallor or icterus ENT: MMM, hearing impairment CV: RRR PULM: Bibasilar rales, no wheezing ABD: soft, ND, NT, +BS CNS: AAO x 3, non focal EXT: No edema or tenderness     GEN: NAD SKIN: Warm and dry EYES: EOMI ENT: MMM CV: RRR PULM: Coarse breath sounds, bibasilar rales, no wheezing ABD: soft, ND, NT, +BS CNS: AAO x 3, non focal EXT: No edema or tenderness     Pressure Injury 06/11/22 Sacrum Mid Stage 2 -  Partial thickness loss of dermis presenting as a shallow open injury with a red, pink wound bed without slough. (Active)  06/11/22 2300  Location: Sacrum  Location Orientation: Mid  Staging: Stage 2 -  Partial thickness loss of dermis presenting as a shallow open injury with a red, pink wound bed without slough.  Wound Description (Comments):   Present on Admission: Yes  Dressing Type Foam - Lift dressing to assess site every shift 06/21/22 0100     Data Reviewed:   I have personally reviewed following labs and imaging studies:  Labs: Labs show the following:   Basic Metabolic Panel: Recent Labs  Lab 06/17/22 1043 06/18/22 0500 06/19/22 0608 06/21/22 0847  NA 138 137 141 138  K 4.1 4.4 4.1 3.9  CL 104 109 112* 102  CO2 21* 19* 23 27  GLUCOSE 154* 206* 138* 255*  BUN 31* 26* 29* 36*  CREATININE 1.37* 1.10 1.11 1.09  CALCIUM 8.8* 8.0* 8.2* 8.4*  MG  --   --   --  1.7    GFR Estimated Creatinine Clearance: 40.9 mL/min (by C-G formula based on SCr of 1.09 mg/dL). Liver Function Tests: Recent Labs  Lab 06/17/22 1043  AST 35  ALT 23  ALKPHOS 55  BILITOT 1.2  PROT 7.6  ALBUMIN 2.9*   No results for input(s): "LIPASE", "AMYLASE" in the last 168 hours. No results for input(s): "AMMONIA" in the last 168 hours. Coagulation profile No results for input(s): "INR", "PROTIME" in the last 168 hours.  CBC: Recent Labs  Lab 06/17/22 1043 06/18/22 0500 06/19/22 0510 06/20/22 0214 06/21/22 0847  WBC 9.3 6.9 10.6* 14.1* 16.2*  NEUTROABS 6.0  --  8.0* 10.4* 12.8*  HGB 10.7* 8.7* 7.1* 10.5* 12.3*  HCT 33.4* 26.7* 22.1* 31.6* 36.6*  MCV 87.9 87.5 89.5 86.6 85.5  PLT 400 327 267 346 387   Cardiac Enzymes: No results for input(s): "CKTOTAL", "CKMB", "CKMBINDEX", "TROPONINI" in the last 168 hours.  BNP (last 3 results) No results for input(s): "PROBNP" in the last 8760 hours. CBG: Recent Labs  Lab 06/20/22 0823 06/20/22 1202 06/20/22 1616 06/20/22 2010 06/21/22 0825  GLUCAP 191* 183* 230* 217* 251*   D-Dimer: No results for input(s): "DDIMER" in the last 72 hours. Hgb A1c: No results for input(s): "HGBA1C" in the last 72 hours. Lipid Profile: No results for input(s): "CHOL", "HDL", "LDLCALC", "TRIG", "CHOLHDL", "LDLDIRECT" in the last 72 hours. Thyroid function studies: No results for input(s): "TSH", "T4TOTAL", "T3FREE", "THYROIDAB" in the last 72 hours.  Invalid input(s): "FREET3" Anemia work up: No results for input(s): "VITAMINB12", "FOLATE", "FERRITIN", "TIBC", "IRON", "RETICCTPCT" in the last 72 hours. Sepsis Labs: Recent Labs  Lab 06/17/22 1043 06/17/22 1300 06/17/22 1438 06/17/22 1859 06/18/22 0500 06/19/22 0510 06/20/22 0214 06/21/22 0847  WBC 9.3  --   --   --  6.9 10.6* 14.1*  16.2*  LATICACIDVEN 2.0* 2.4* 2.0* 1.6  --   --   --   --     Microbiology Recent Results (from the past 240 hour(s))  Resp panel by RT-PCR  (RSV, Flu A&B, Covid) Anterior Nasal Swab     Status: None   Collection Time: 06/17/22 10:40 AM   Specimen: Anterior Nasal Swab  Result Value Ref Range Status   SARS Coronavirus 2 by RT PCR NEGATIVE NEGATIVE Final    Comment: (NOTE) SARS-CoV-2 target nucleic acids are NOT DETECTED.  The SARS-CoV-2 RNA is generally detectable in upper respiratory specimens during the acute phase of infection. The lowest concentration of SARS-CoV-2 viral copies this assay can detect is 138 copies/mL. A negative result does not preclude SARS-Cov-2 infection and should not be used as the sole basis for treatment or other patient management decisions. A negative result may occur with  improper specimen collection/handling, submission of specimen other than nasopharyngeal swab, presence of viral mutation(s) within the areas targeted by this assay, and inadequate number of viral copies(<138 copies/mL). A negative result must be combined with clinical observations, patient history, and epidemiological information. The expected result is Negative.  Fact Sheet for Patients:  EntrepreneurPulse.com.au  Fact Sheet for Healthcare Providers:  IncredibleEmployment.be  This test is no t yet approved or cleared by the [place] FDA and  has been authorized for detection and/or diagnosis of SARS-CoV-2 by FDA under an Emergency Use Authorization (EUA). This EUA will remain  in effect (meaning this test can be used) for the duration of the COVID-19 declaration under Section 564(b)(1) of the Act, 21 U.S.C.section 360bbb-3(b)(1), unless the authorization is terminated  or revoked sooner.       Influenza A by PCR NEGATIVE NEGATIVE Final   Influenza B by PCR NEGATIVE NEGATIVE Final    Comment: (NOTE) The Xpert Xpress SARS-CoV-2/FLU/RSV plus assay is intended as an aid in the diagnosis of influenza from Nasopharyngeal swab specimens and should not be used as a sole basis for  treatment. Nasal washings and aspirates are unacceptable for Xpert Xpress SARS-CoV-2/FLU/RSV testing.  Fact Sheet for Patients: EntrepreneurPulse.com.au  Fact Sheet for Healthcare Providers: IncredibleEmployment.be  This test is not yet approved or cleared by the [place] FDA and has been authorized for detection and/or diagnosis of SARS-CoV-2 by FDA under an Emergency Use Authorization (EUA). This EUA will remain in effect (meaning this test can be used) for the duration of the COVID-19 declaration under Section 564(b)(1) of the Act, 21 U.S.C. section 360bbb-3(b)(1), unless the authorization is terminated or revoked.     Resp Syncytial Virus by PCR NEGATIVE NEGATIVE Final    Comment: (NOTE) Fact Sheet for Patients: EntrepreneurPulse.com.au  Fact Sheet for Healthcare Providers: IncredibleEmployment.be  This test is not yet approved or cleared by the [place] FDA and has been authorized for detection and/or diagnosis of SARS-CoV-2 by FDA under an Emergency Use Authorization (EUA). This EUA will remain in effect (meaning this test can be used) for the duration of the COVID-19 declaration under Section 564(b)(1) of the Act, 21 U.S.C. section 360bbb-3(b)(1), unless the authorization is terminated or revoked.  Performed at [facility], [address] [place], [place] [address]   Culture, blood (routine x 2)     Status: None (Preliminary result)   Collection Time: 06/17/22 10:43 AM   Specimen: BLOOD RIGHT FOREARM  Result Value Ref Range Status   Specimen Description BLOOD RIGHT FOREARM  Final   Special Requests   Final  BOTTLES DRAWN AEROBIC AND ANAEROBIC Blood Culture adequate volume   Culture   Final    NO GROWTH 4 DAYS Performed at [facility], [address] [place], [place] [address]    Report Status PENDING  Incomplete  Culture, blood (routine x 2)     Status:  None (Preliminary result)   Collection Time: 06/17/22 10:43 AM   Specimen: BLOOD RIGHT HAND  Result Value Ref Range Status   Specimen Description BLOOD RIGHT HAND  Final   Special Requests   Final    BOTTLES DRAWN AEROBIC AND ANAEROBIC Blood Culture adequate volume   Culture   Final    NO GROWTH 4 DAYS Performed at [facility], [address] [place], [place] [address]    Report Status PENDING  Incomplete  MRSA Next Gen by PCR, Nasal     Status: None   Collection Time: 06/17/22  6:59 PM   Specimen: Nasal Mucosa; Nasal Swab  Result Value Ref Range Status   MRSA by PCR Next Gen NOT DETECTED NOT DETECTED Final    Comment: (NOTE) The GeneXpert MRSA Assay (FDA approved for NASAL specimens only), is one component of a comprehensive MRSA colonization surveillance program. It is not intended to diagnose MRSA infection nor to guide or monitor treatment for MRSA infections. Test performance is not FDA approved in patients less than 41 years old. Performed at [facility], [address] [place], [place] [address]     Procedures and diagnostic studies:  DG Chest [facility] 1 View  Result Date: 06/20/2022 CLINICAL DATA:  Dyspnea, chest pain EXAM: PORTABLE CHEST 1 VIEW COMPARISON:  06/17/2022 FINDINGS: The lungs appear hyperinflated in keeping with changes of underlying COPD. Superimposed extensive interstitial and airspace infiltrates have progressed since prior examination in keeping with multifocal pneumonic infiltrate, more prevalent within the right lung base. Small left pleural effusion has developed. Coronary artery bypass grafting has been performed. Cardiac size within normal limits. No acute bone abnormality. IMPRESSION: 1. Progressive, extensive multifocal pneumonic infiltrate, more prevalent within the right lung base. Small left pleural effusion. 2. COPD. Electronically Signed   By: Fidela Salisbury M.D.   On: 06/20/2022 00:31               LOS: 4 days    Javier Gell  [facility]   Pager on [email or web address]. If 7PM-7AM, please contact night-coverage at [email or web address]     06/21/2022, 10:03 AM

## 2022-06-21 NOTE — Progress Notes (Signed)
*  PRELIMINARY RESULTS* Echocardiogram 2D Echocardiogram has been performed.  [name] 06/21/2022, 11:27 AM

## 2022-06-21 NOTE — Evaluation (Signed)
Occupational Therapy Evaluation Patient Details Name: [name] MRN: [id number] DOB: Nov 12, 1934 Today's Date: 06/21/2022   History of Present Illness [name] is a 87 y.o. male with medical history significant of hypertension, hyperlipidemia, diabetes mellitus, COPD, severe AS, depression with anxiety, tobacco abuse, A flutter/A-fib, CKD-3A, DVT, aspiration pneumonia, BPH, who presents with shortness breath.   Clinical Impression   Patient presenting with decreased independence in self care, balance, functional mobility/transfers, and endurance. Prior to admission, pt lived with son, was independent for ADLs/IADLs (assist for heavy IADLs PRN), and independent for functional mobility without an AD. Pt currently functioning at supervision for bed mobility, supervision to stand from EOB, and Min guard for step pivot transfer from EOB>recliner using RW. Pt grossly set up-supervision for seated ADLs. Anticipate CGA-Min A for standing ADL tasks. Pt will benefit from acute OT to increase overall independence in the areas of ADLs and functional mobility in order to safely discharge home. Pt could benefit from [facility] following D/C to decrease falls risk, improve balance, and maximize independence in self-care within own home environment.     Recommendations for follow up therapy are one component of a multi-disciplinary discharge planning process, led by the attending physician.  Recommendations may be updated based on patient status, additional functional criteria and insurance authorization.   Follow Up Recommendations  Home health OT     Assistance Recommended at Discharge Intermittent Supervision/Assistance  Patient can return home with the following A little help with walking and/or transfers;A little help with bathing/dressing/bathroom;Assistance with cooking/housework;Assist for transportation;Help with stairs or ramp for entrance    Functional Status Assessment  Patient has had a  recent decline in their functional status and demonstrates the ability to make significant improvements in function in a reasonable and predictable amount of time.  Equipment Recommendations  Other (comment) (TBD; no family present to confirm home DME, difficult to determine 2/2 pt HOH)    Recommendations for Other Services       Precautions / Restrictions Precautions Precautions: Fall Precaution Comments: Very HOH Restrictions Weight Bearing Restrictions: No      Mobility Bed Mobility Overal bed mobility: Needs Assistance Bed Mobility: Supine to Sit     Supine to sit: Supervision          Transfers Overall transfer level: Needs assistance Equipment used: Rolling walker (2 wheels) Transfers: Sit to/from Stand, Bed to chair/wheelchair/BSC Sit to Stand: Supervision     Step pivot transfers: Min guard            Balance Overall balance assessment: Needs assistance Sitting-balance support: Bilateral upper extremity supported, Feet supported Sitting balance-Leahy Scale: Good     Standing balance support: Bilateral upper extremity supported Standing balance-Leahy Scale: Fair                             ADL either performed or assessed with clinical judgement   ADL Overall ADL's : Needs assistance/impaired     Grooming: Set up;Supervision/safety;Sitting           Upper Body Dressing : Set up;Sitting;Supervision/safety       Toilet Transfer: Rolling walker (2 wheels);Supervision/safety Toilet Transfer Details (indicate cue type and reason): simulated, short ambulatory transfer           General ADL Comments: Anticipate CGA-Min A for standing ADL tasks     Vision Baseline Vision/History: 1 Wears glasses Patient Visual Report: No change from baseline  Perception     Praxis      Pertinent Vitals/Pain Pain Assessment Pain Assessment: No/denies pain     Hand Dominance Right   Extremity/Trunk Assessment Upper Extremity  Assessment Upper Extremity Assessment: Generalized weakness   Lower Extremity Assessment Lower Extremity Assessment: Generalized weakness   Cervical / Trunk Assessment Cervical / Trunk Assessment: Kyphotic   Communication Communication Communication: HOH   Cognition Arousal/Alertness: Awake/alert Behavior During Therapy: WFL for tasks assessed/performed Overall Cognitive Status: Difficult to assess                                 General Comments: Very HOH, benefits from tactile/visual cues.     General Comments  SpO2 94% post transfer to recliner while on 3L O2 via [place]    Exercises Other Exercises Other Exercises: OT provided education re: role of OT, OT POC, post acute recs, sitting up for all meals, EOB/OOB mobility with assistance, home/fall safety.     Shoulder Instructions      Home Living Family/patient expects to be discharged to:: Private residence Living Arrangements: Children (to son's house) Available Help at Discharge: Family;Available 24 hours/day Type of Home: House Home Access: Stairs to enter [employer] of Steps: 6 Entrance Stairs-Rails:  (reports son's house has rails but does not say which side) Home Layout: One level     Bathroom Shower/Tub: Teacher, early years/pre: Standard     Home Equipment: Shower seat          Prior Functioning/Environment Prior Level of Function : Independent/Modified Independent             Mobility Comments: Reports since last admission, was ambulating without AD per his PLOF. ADLs Comments: IND, family reports pt mows his own yards and completes IADLs. Family assists with heavy household chores if needed        OT Problem List: Decreased strength;Decreased activity tolerance;Impaired balance (sitting and/or standing);Cardiopulmonary status limiting activity      OT Treatment/Interventions: Self-care/ADL training;Therapeutic exercise;DME and/or AE instruction;Therapeutic  activities;Patient/family education;Balance training;Energy conservation    OT Goals(Current goals can be found in the care plan section) Acute Rehab OT Goals Patient Stated Goal: return home OT Goal Formulation: With patient Time For Goal Achievement: 07/05/22 Potential to Achieve Goals: Good   OT Frequency: Min 2X/week    Co-evaluation              AM-PAC OT "6 Clicks" Daily Activity     Outcome Measure Help from another person eating meals?: None Help from another person taking care of personal grooming?: A Little Help from another person toileting, which includes using toliet, bedpan, or urinal?: A Little Help from another person bathing (including washing, rinsing, drying)?: A Little Help from another person to put on and taking off regular upper body clothing?: None Help from another person to put on and taking off regular lower body clothing?: A Little 6 Click Score: 20   End of Session Equipment Utilized During Treatment: Oxygen;Rolling walker (2 wheels) Nurse Communication: Mobility status  Activity Tolerance: Patient tolerated treatment well Patient left: in chair;with call bell/phone within reach;with chair alarm set;Other (comment) (handoff to PT)  OT Visit Diagnosis: Unsteadiness on feet (R26.81);Other abnormalities of gait and mobility (R26.89);Muscle weakness (generalized) (M62.81)                Time: 3536-1443 OT Time Calculation (min): 14 min Charges:  OT General Charges $OT  Visit: 1 Visit OT Evaluation $OT Eval Low Complexity: 1 Low  [facility] MS, OTR/L ascom [phone]  06/21/22, 1:55 PM

## 2022-06-21 NOTE — Inpatient Diabetes Management (Signed)
Inpatient Diabetes Program Recommendations  AACE/ADA: New Consensus Statement on Inpatient Glycemic Control   Target Ranges:  Prepandial:   less than 140 mg/dL      Peak postprandial:   less than 180 mg/dL (1-2 hours)      Critically ill patients:  140 - 180 mg/dL     Latest Reference Range & Units 06/20/22 08:23 06/20/22 12:02 06/20/22 16:16 06/20/22 20:10 06/21/22 08:25  Glucose-Capillary 70 - 99 mg/dL 191 (H) 183 (H) 230 (H) 217 (H) 251 (H)   Review of Glycemic Control  Diabetes history: DM2 Outpatient Diabetes medications: 70/30 8 units QAM, 70/30 12 units QPM Current orders for Inpatient glycemic control: 70/30 10 units QAM, 70/30 14 units QPM, Novolog 0-15 units TID with meals, Novolog 0-5 units QHS; Prednisone 30 mg QAM   Inpatient Diabetes Program Recommendations:     Insulin: If steroids are continued as ordered, please consider increasing 70/30 to 12 units QAM and 70/30 15 units QPM.   Thanks, Barnie Alderman, RN, MSN, [place] Diabetes Coordinator Inpatient Diabetes Program [phone] (Team Pager from 8am to [place])

## 2022-06-22 ENCOUNTER — Inpatient Hospital Stay: Payer: Medicare HMO

## 2022-06-22 DIAGNOSIS — J189 Pneumonia, unspecified organism: Secondary | ICD-10-CM | POA: Diagnosis not present

## 2022-06-22 LAB — CBC WITH DIFFERENTIAL/PLATELET
Abs Immature Granulocytes: 0.11 10*3/uL — ABNORMAL HIGH (ref 0.00–0.07)
Basophils Absolute: 0 10*3/uL (ref 0.0–0.1)
Basophils Relative: 0 %
Eosinophils Absolute: 0 10*3/uL (ref 0.0–0.5)
Eosinophils Relative: 0 %
HCT: 35.4 % — ABNORMAL LOW (ref 39.0–52.0)
Hemoglobin: 11.7 g/dL — ABNORMAL LOW (ref 13.0–17.0)
Immature Granulocytes: 1 %
Lymphocytes Relative: 12 %
Lymphs Abs: 1.9 10*3/uL (ref 0.7–4.0)
MCH: 28.5 pg (ref 26.0–34.0)
MCHC: 33.1 g/dL (ref 30.0–36.0)
MCV: 86.1 fL (ref 80.0–100.0)
Monocytes Absolute: 1.3 10*3/uL — ABNORMAL HIGH (ref 0.1–1.0)
Monocytes Relative: 8 %
Neutro Abs: 13.4 10*3/uL — ABNORMAL HIGH (ref 1.7–7.7)
Neutrophils Relative %: 79 %
Platelets: 377 10*3/uL (ref 150–400)
RBC: 4.11 MIL/uL — ABNORMAL LOW (ref 4.22–5.81)
RDW: 16.8 % — ABNORMAL HIGH (ref 11.5–15.5)
WBC: 16.8 10*3/uL — ABNORMAL HIGH (ref 4.0–10.5)
nRBC: 0 % (ref 0.0–0.2)

## 2022-06-22 LAB — GLUCOSE, CAPILLARY
Glucose-Capillary: 264 mg/dL — ABNORMAL HIGH (ref 70–99)
Glucose-Capillary: 256 mg/dL — ABNORMAL HIGH (ref 70–99)
Glucose-Capillary: 309 mg/dL — ABNORMAL HIGH (ref 70–99)
Glucose-Capillary: 307 mg/dL — ABNORMAL HIGH (ref 70–99)

## 2022-06-22 LAB — CULTURE, BLOOD (ROUTINE X 2)
Culture: NO GROWTH
Culture: NO GROWTH
Special Requests: ADEQUATE
Special Requests: ADEQUATE

## 2022-06-22 LAB — BASIC METABOLIC PANEL
Anion gap: 8 (ref 5–15)
BUN: 51 mg/dL — ABNORMAL HIGH (ref 8–23)
CO2: 29 mmol/L (ref 22–32)
Calcium: 8.3 mg/dL — ABNORMAL LOW (ref 8.9–10.3)
Chloride: 98 mmol/L (ref 98–111)
Creatinine, Ser: 1.26 mg/dL — ABNORMAL HIGH (ref 0.61–1.24)
GFR, Estimated: 56 mL/min — ABNORMAL LOW (ref >60)
Glucose, Bld: 301 mg/dL — ABNORMAL HIGH (ref 70–99)
Potassium: 3.9 mmol/L (ref 3.5–5.1)
Sodium: 135 mmol/L (ref 135–145)

## 2022-06-22 LAB — MAGNESIUM: Magnesium: 1.9 mg/dL (ref 1.7–2.4)

## 2022-06-22 MED ORDER — IOHEXOL 350 MG/ML SOLN
75.0000 mL | Freq: Once | INTRAVENOUS | Status: AC | PRN
  Administered 2022-06-22: 75 mL via INTRAVENOUS

## 2022-06-22 MED ORDER — INSULIN ASPART PROT & ASPART (70-30 MIX) 100 UNIT/ML ~~LOC~~ SUSP
18.0000 [IU] | Freq: Every day | SUBCUTANEOUS | Status: DC
  Administered 2022-06-22 – 2022-06-28 (×5): 18 [IU] via SUBCUTANEOUS

## 2022-06-22 MED ORDER — INSULIN ASPART PROT & ASPART (70-30 MIX) 100 UNIT/ML ~~LOC~~ SUSP
12.0000 [IU] | Freq: Every day | SUBCUTANEOUS | Status: DC
  Administered 2022-06-22 – 2022-06-27 (×4): 12 [IU] via SUBCUTANEOUS

## 2022-06-22 MED ORDER — FUROSEMIDE 10 MG/ML IJ SOLN
20.0000 mg | Freq: Once | INTRAMUSCULAR | Status: AC
  Administered 2022-06-22: 20 mg via INTRAVENOUS
  Filled 2022-06-22: qty 2

## 2022-06-22 MED ORDER — BUDESONIDE 0.5 MG/2ML IN SUSP
0.5000 mg | Freq: Two times a day (BID) | RESPIRATORY_TRACT | Status: DC
  Administered 2022-06-22 – 2022-06-28 (×12): 0.5 mg via RESPIRATORY_TRACT
  Filled 2022-06-22 (×12): qty 2

## 2022-06-22 MED ORDER — PREDNISONE 20 MG PO TABS
30.0000 mg | ORAL_TABLET | Freq: Every day | ORAL | Status: AC
  Administered 2022-06-23: 30 mg via ORAL
  Filled 2022-06-22: qty 1

## 2022-06-22 MED ORDER — FUROSEMIDE 10 MG/ML IJ SOLN
20.0000 mg | Freq: Two times a day (BID) | INTRAMUSCULAR | Status: DC
  Administered 2022-06-22 – 2022-06-23 (×2): 20 mg via INTRAVENOUS
  Filled 2022-06-22 (×2): qty 2

## 2022-06-22 MED ORDER — DOXYCYCLINE HYCLATE 100 MG PO TABS
100.0000 mg | ORAL_TABLET | Freq: Two times a day (BID) | ORAL | Status: DC
  Administered 2022-06-22: 100 mg via ORAL
  Filled 2022-06-22: qty 1

## 2022-06-22 MED ORDER — METHYLPREDNISOLONE SODIUM SUCC 40 MG IJ SOLR
40.0000 mg | Freq: Once | INTRAMUSCULAR | Status: AC
  Administered 2022-06-22: 40 mg via INTRAVENOUS
  Filled 2022-06-22: qty 1

## 2022-06-22 NOTE — Progress Notes (Signed)
Progress Note    [name]  DJS:[id number] DOB: [birth year]  DOA: 06/17/2022 PCP: Susy Frizzle, MD    Brief Narrative:    Medical records reviewed and are as summarized below:  [name] is a 87 y.o. male with a PMH of Chronic Hypoxic Respiratory Failure on 2L [place] at night, COPD, HTN, HL, DM2, CAD s/p CABG, Chronic Diastolic Heart Failure, severe Aortic Stenosis, PAF  s/p ablation and history of DVT (on Eliquis), CKD3, BPH, and severe protein calorie malnutrition who presented to the ED with acute dyspnea and acute hypoxia due to multifocal pneumonia and acute COPD exacerbation.  Patient was treated with antibiotics and steroids.  Of note patient was admitted on 12/18 for pneumonia.  He initially improved but feels a little tired and more short of breath today.  Chest CT shows no evidence of a PE.   Assessment/Plan:   Principal Problem:   Bilateral pneumonia Active Problems:   COPD exacerbation (HCC)   Acute and chronic respiratory failure with hypoxia (HCC)   Severe sepsis (HCC)   Essential hypertension   Chronic kidney disease, stage 3a (HCC)   Type II diabetes mellitus with renal manifestations (HCC)   Back pain   BPH (benign prostatic hyperplasia)   Paroxysmal atrial fibrillation (HCC)   Cerebrovascular disease   Cigarette smoker   Depression with anxiety   Protein-calorie malnutrition, severe   Body mass index is 16.5 kg/m.  (Underweight)  Acute on Chronic Hypoxic Respiratory Failure: - Oxygen is currently at 4L [place].  His baseline ~ 2L [place].    Hypotension, resolved  Multifocal Pneumonia: CT shows bilateral opacities.   - This is Day 6 out of 7 of IV Cefepime and Flagyl.   - Given patient's clinical change today and recent diagnosis of pneumonia on 12/18, I will add Doxycycline for atypical coverage and added MRSA coverage.  Acute COPD Exacerbation / Chronic Emphysema: Patient has poor residual lung function and continues to have bibasilar  wheezing and is short of breath.  He will need a prolonged course of steroids. - This is Day 6 out of 10 of steroids. - Continue nebs and start Pulmicort.  Chronic Diastolic Heart Failure / Small Bilateral Pleural Effusions:  - Continue IV Lasix 20 mg BID.  Coronary Artery Disease s/p CABG / Atherosclerosis / History of Stroke: - Aspirin and Statin.  PAF: - Patient follows with Cardiologist Dr. Stanford Breed.  Bilateral Renal Stones: - Abdominal exam is stable.  GERD: - PPI  DM2: Glucose is a little elevated on steroids. - Increase NPH from 10 to 12 units am and 14 to 18 units pm. - Continue Ssi with POC glucose ACHS.  Lower Back Pain: - Lumbar CT showed no acute abnormality.  BPH: Flomax Prior Nasal Fracture: Follow up with ENT outpatient. Hearing Loss  Chronic Tobacco Use: - Counseled on cessation.  RUL Lung Nodule: - Monitor outpatient  Severe Protein Calorie Malnutrition: - Ensure TID.  Code Status: DNR/DNI   Diet Order             Diet regular Room service appropriate? Yes; Fluid consistency: Thin  Diet effective now                   Consultants: None  Procedures: None    Medications:    aspirin  325 mg Oral Daily   enoxaparin (LOVENOX) injection  40 mg Subcutaneous Q24H   feeding supplement  237 mL Oral TID BM  insulin aspart  0-15 Units Subcutaneous TID WC   insulin aspart  0-5 Units Subcutaneous QHS   insulin aspart protamine- aspart  12 Units Subcutaneous Q breakfast   insulin aspart protamine- aspart  18 Units Subcutaneous Q supper   ipratropium-albuterol  3 mL Nebulization TID   multivitamin with minerals  1 tablet Oral Daily   nicotine  21 mg Transdermal Daily   mouth rinse  15 mL Mouth Rinse 4 times per day   pantoprazole  40 mg Oral Daily   pravastatin  40 mg Oral QHS   [START ON 06/23/2022] predniSONE  30 mg Oral Q breakfast   tamsulosin  0.4 mg Oral Daily   Continuous Infusions:  ceFEPime (MAXIPIME) IV 2 g (06/22/22 1003)    metronidazole 500 mg (06/22/22 1600)     Anti-infectives (From admission, onward)    Start     Dose/Rate Route Frequency Ordered Stop   06/18/22 1200  vancomycin (VANCOCIN) 750 mg in sodium chloride 0.9 % 250 mL IVPB  Status:  Discontinued        750 mg 250 mL/hr over 60 Minutes Intravenous Every 24 hours 06/17/22 1628 06/18/22 0533   06/18/22 1200  vancomycin (VANCOREADY) IVPB 750 mg/150 mL  Status:  Discontinued        750 mg 150 mL/hr over 60 Minutes Intravenous Every 24 hours 06/18/22 0533 06/18/22 1125   06/17/22 2200  ceFEPIme (MAXIPIME) 2 g in sodium chloride 0.9 % 100 mL IVPB        2 g 200 mL/hr over 30 Minutes Intravenous Every 12 hours 06/17/22 1628 06/23/22 2359   06/17/22 1600  metroNIDAZOLE (FLAGYL) IVPB 500 mg        500 mg 100 mL/hr over 60 Minutes Intravenous Every 12 hours 06/17/22 1553 06/23/22 2359   06/17/22 1200  vancomycin (VANCOCIN) IVPB 1000 mg/200 mL premix        1,000 mg 200 mL/hr over 60 Minutes Intravenous  Once 06/17/22 1154 06/17/22 1448   06/17/22 1200  ceFEPIme (MAXIPIME) 2 g in sodium chloride 0.9 % 100 mL IVPB        2 g 200 mL/hr over 30 Minutes Intravenous  Once 06/17/22 1154 06/17/22 1330        Family Communication/Anticipated D/C date and plan/Code Status   DVT prophylaxis: enoxaparin (LOVENOX) injection 40 mg Start: 06/17/22 2200     Code Status: DNR  Family Communication: Loma Sousa, daughter-in-law, at the bedside Disposition Plan: PT/OT recommends Home with Home Health.  Discharge once clinically improves and oxygen requirements improve to 2L [place].  Patient has home oxygen at home.  [name] lives alone at home - I encouraged him to live with family (daughter or son) after discharge - this will need to be re-addressed when patient is feeling a little better.     Status is: Inpatient Remains inpatient appropriate because: pneumonia and acute hypoxia    Subjective:   [name] symptoms are a little worse today. He  endorses increased shortness of breath. He also appears fatigued and malaise I spoke with family at bedside and we discussed his discharge plan.  I recommend patient live with family and not alone.  Objective:    Vitals:   06/22/22 0822 06/22/22 1129 06/22/22 1401 06/22/22 1513  BP: (!) 147/98 (!) 118/57  124/67  Pulse: 87 96  89  Resp: (!) 22 (!) 22  20  Temp: (!) 97.2 F (36.2 C) 97.8 F (36.6 C)  97.9 F (36.6 C)  TempSrc:    Oral  SpO2: 90% 99% 95% 95%  Weight:      Height:       No data found.   Intake/Output Summary (Last 24 hours) at 06/22/2022 1742 Last data filed at 06/22/2022 1656 Gross per 24 hour  Intake 480 ml  Output 1150 ml  Net -670 ml    Filed Weights   06/17/22 0958  Weight: 58.3 kg    Exam:  Physical Exam Constitutional:      Comments: Chronically ill Acute respiratory distress Fatigue malaise  malnourished  HENT:     Head: Normocephalic and atraumatic.     Mouth/Throat:     Mouth: Mucous membranes are moist.  Eyes:     Extraocular Movements: Extraocular movements intact.     Pupils: Pupils are equal, round, and reactive to light.  Cardiovascular:     Rate and Rhythm: Normal rate and regular rhythm.     Pulses: Normal pulses.     Heart sounds: Normal heart sounds.  Pulmonary:     Effort: Pulmonary effort is normal.     Breath sounds: Wheezing present.  Abdominal:     General: There is no distension.     Palpations: Abdomen is soft.     Tenderness: There is no abdominal tenderness.  Musculoskeletal:        General: Normal range of motion.     Cervical back: Normal range of motion and neck supple.  Skin:    General: Skin is warm.     Capillary Refill: Capillary refill takes less than 2 seconds.  Neurological:     General: No focal deficit present.  Psychiatric:        Mood and Affect: Mood normal.      Pressure Injury 06/11/22 Sacrum Mid Stage 2 -  Partial thickness loss of dermis presenting as a shallow open injury with a  red, pink wound bed without slough. (Active)  06/11/22 2300  Location: Sacrum  Location Orientation: Mid  Staging: Stage 2 -  Partial thickness loss of dermis presenting as a shallow open injury with a red, pink wound bed without slough.  Wound Description (Comments):   Present on Admission: Yes  Dressing Type Foam - Lift dressing to assess site every shift 06/21/22 0100     Data Reviewed:   I have personally reviewed following labs and imaging studies:  Labs: Labs show the following:   Basic Metabolic Panel: Recent Labs  Lab 06/17/22 1043 06/18/22 0500 06/19/22 0608 06/21/22 0847 06/22/22 0537  NA 138 137 141 138 135  K 4.1 4.4 4.1 3.9 3.9  CL 104 109 112* 102 98  CO2 21* 19* 23 27 29   GLUCOSE 154* 206* 138* 255* 301*  BUN 31* 26* 29* 36* 51*  CREATININE 1.37* 1.10 1.11 1.09 1.26*  CALCIUM 8.8* 8.0* 8.2* 8.4* 8.3*  MG  --   --   --  1.7 1.9    GFR Estimated Creatinine Clearance: 35.3 mL/min (A) (by C-G formula based on SCr of 1.26 mg/dL (H)). Liver Function Tests: Recent Labs  Lab 06/17/22 1043  AST 35  ALT 23  ALKPHOS 55  BILITOT 1.2  PROT 7.6  ALBUMIN 2.9*     CBC: Recent Labs  Lab 06/17/22 1043 06/18/22 0500 06/19/22 0510 06/20/22 0214 06/21/22 0847 06/22/22 0537  WBC 9.3 6.9 10.6* 14.1* 16.2* 16.8*  NEUTROABS 6.0  --  8.0* 10.4* 12.8* 13.4*  HGB 10.7* 8.7* 7.1* 10.5* 12.3* 11.7*  HCT 33.4* 26.7* 22.1*  31.6* 36.6* 35.4*  MCV 87.9 87.5 89.5 86.6 85.5 86.1  PLT 400 327 267 346 387 377     CBG: Recent Labs  Lab 06/21/22 1641 06/21/22 2119 06/22/22 0819 06/22/22 1125 06/22/22 1511  GLUCAP 403* 329* 264* 256* 309*     Sepsis Labs: Recent Labs  Lab 06/17/22 1043 06/17/22 1300 06/17/22 1438 06/17/22 1859 06/18/22 0500 06/19/22 0510 06/20/22 0214 06/21/22 0847 06/22/22 0537  WBC 9.3  --   --   --    < > 10.6* 14.1* 16.2* 16.8*  LATICACIDVEN 2.0* 2.4* 2.0* 1.6  --   --   --   --   --    < > = values in this interval not  displayed.     Microbiology Recent Results (from the past 240 hour(s))  Resp panel by RT-PCR (RSV, Flu A&B, Covid) Anterior Nasal Swab     Status: None   Collection Time: 06/17/22 10:40 AM   Specimen: Anterior Nasal Swab  Result Value Ref Range Status   SARS Coronavirus 2 by RT PCR NEGATIVE NEGATIVE Final    Comment: (NOTE) SARS-CoV-2 target nucleic acids are NOT DETECTED.  The SARS-CoV-2 RNA is generally detectable in upper respiratory specimens during the acute phase of infection. The lowest concentration of SARS-CoV-2 viral copies this assay can detect is 138 copies/mL. A negative result does not preclude SARS-Cov-2 infection and should not be used as the sole basis for treatment or other patient management decisions. A negative result may occur with  improper specimen collection/handling, submission of specimen other than nasopharyngeal swab, presence of viral mutation(s) within the areas targeted by this assay, and inadequate number of viral copies(<138 copies/mL). A negative result must be combined with clinical observations, patient history, and epidemiological information. The expected result is Negative.  Fact Sheet for Patients:  EntrepreneurPulse.com.au  Fact Sheet for Healthcare Providers:  IncredibleEmployment.be  This test is no t yet approved or cleared by the [place] FDA and  has been authorized for detection and/or diagnosis of SARS-CoV-2 by FDA under an Emergency Use Authorization (EUA). This EUA will remain  in effect (meaning this test can be used) for the duration of the COVID-19 declaration under Section 564(b)(1) of the Act, 21 U.S.C.section 360bbb-3(b)(1), unless the authorization is terminated  or revoked sooner.       Influenza A by PCR NEGATIVE NEGATIVE Final   Influenza B by PCR NEGATIVE NEGATIVE Final    Comment: (NOTE) The Xpert Xpress SARS-CoV-2/FLU/RSV plus assay is intended as an aid in the  diagnosis of influenza from Nasopharyngeal swab specimens and should not be used as a sole basis for treatment. Nasal washings and aspirates are unacceptable for Xpert Xpress SARS-CoV-2/FLU/RSV testing.  Fact Sheet for Patients: EntrepreneurPulse.com.au  Fact Sheet for Healthcare Providers: IncredibleEmployment.be  This test is not yet approved or cleared by the [place] FDA and has been authorized for detection and/or diagnosis of SARS-CoV-2 by FDA under an Emergency Use Authorization (EUA). This EUA will remain in effect (meaning this test can be used) for the duration of the COVID-19 declaration under Section 564(b)(1) of the Act, 21 U.S.C. section 360bbb-3(b)(1), unless the authorization is terminated or revoked.     Resp Syncytial Virus by PCR NEGATIVE NEGATIVE Final    Comment: (NOTE) Fact Sheet for Patients: EntrepreneurPulse.com.au  Fact Sheet for Healthcare Providers: IncredibleEmployment.be  This test is not yet approved or cleared by the [place] FDA and has been authorized for detection and/or diagnosis of SARS-CoV-2 by FDA  under an Emergency Use Authorization (EUA). This EUA will remain in effect (meaning this test can be used) for the duration of the COVID-19 declaration under Section 564(b)(1) of the Act, 21 U.S.C. section 360bbb-3(b)(1), unless the authorization is terminated or revoked.  Performed at [facility], [address] [place], [place] [address]   Culture, blood (routine x 2)     Status: None   Collection Time: 06/17/22 10:43 AM   Specimen: BLOOD RIGHT FOREARM  Result Value Ref Range Status   Specimen Description BLOOD RIGHT FOREARM  Final   Special Requests   Final    BOTTLES DRAWN AEROBIC AND ANAEROBIC Blood Culture adequate volume   Culture   Final    NO GROWTH 5 DAYS Performed at [facility], [address] [place], [place] [address]     Report Status 06/22/2022 FINAL  Final  Culture, blood (routine x 2)     Status: None   Collection Time: 06/17/22 10:43 AM   Specimen: BLOOD RIGHT HAND  Result Value Ref Range Status   Specimen Description BLOOD RIGHT HAND  Final   Special Requests   Final    BOTTLES DRAWN AEROBIC AND ANAEROBIC Blood Culture adequate volume   Culture   Final    NO GROWTH 5 DAYS Performed at [facility], [address] [place], [place] [address]    Report Status 06/22/2022 FINAL  Final  MRSA Next Gen by PCR, Nasal     Status: None   Collection Time: 06/17/22  6:59 PM   Specimen: Nasal Mucosa; Nasal Swab  Result Value Ref Range Status   MRSA by PCR Next Gen NOT DETECTED NOT DETECTED Final    Comment: (NOTE) The GeneXpert MRSA Assay (FDA approved for NASAL specimens only), is one component of a comprehensive MRSA colonization surveillance program. It is not intended to diagnose MRSA infection nor to guide or monitor treatment for MRSA infections. Test performance is not FDA approved in patients less than 24 years old. Performed at [facility], [address] [place], [place] [address]     Procedures and diagnostic studies:  CT Angio Chest Pulmonary Embolism (PE) W or WO Contrast  Result Date: 06/22/2022 CLINICAL DATA:  Chronic dyspnea, pulmonary embolism suspected, hypoxia EXAM: CT ANGIOGRAPHY CHEST WITH CONTRAST TECHNIQUE: Multidetector CT imaging of the chest was performed using the standard protocol during bolus administration of intravenous contrast. Multiplanar CT image reconstructions and MIPs were obtained to evaluate the vascular anatomy. RADIATION DOSE REDUCTION: This exam was performed according to the departmental dose-optimization program which includes automated exposure control, adjustment of the mA and/or kV according to patient size and/or use of iterative reconstruction technique. CONTRAST:  58mL OMNIPAQUE IOHEXOL 350 MG/ML SOLN COMPARISON:  CT done on  06/17/2022, chest radiographs done on 06/20/2022 FINDINGS: Cardiovascular: There are no intraluminal filling defects in pulmonary artery branches. Coronary artery calcifications are seen. There is previous coronary bypass surgery. Contrast density in thoracic aorta is less than optimal to evaluate the lumen. There are atherosclerotic plaques and calcifications in thoracic aorta. Mediastinum/Nodes: No interval changes are noted in slightly enlarged lymph nodes in mediastinum and hilar regions. Lungs/Pleura: Centrilobular and panlobular emphysema is seen. Blebs and bullae are seen in both apices. There are linear densities in both apices suggesting scarring. There is possible ectatic bronchus in the anterior aspect of right upper lung field with no significant change. Small patchy infiltrate is seen in right middle lobe with slight improvement. There is interval decrease in  patchy infiltrates in right lower lobe. New infiltrate is seen in lingula. Large new infiltrates are noted in left lower lobe. There is interval appearance of small left pleural effusion. There is fluid density in the lumen of left lower lobe bronchi. There is no pneumothorax. Upper Abdomen: Left renal pelvis is prominent. There is prominence of minor calices in the upper poles of both kidneys. Kidneys are not included in their entirety limiting evaluation. There is 5 mm calcific density in the upper pole of right kidney. There is 2 mm calcific density in the upper pole of left kidney. Musculoskeletal: No acute findings are seen in bony structures. Review of the MIP images confirms the above findings. IMPRESSION: There is no evidence of pulmonary embolism. Coronary artery disease. Aortic atherosclerosis. COPD. There are patchy infiltrates in both lungs with slight improvement in the infiltrate in right lower lobe and marked interval worsening of infiltrates in left lower lobe. New small patchy infiltrate is seen in lingula. Findings suggest  multifocal pneumonia with interval worsening in the left lung. There is interval appearance of small left pleural effusion. Mucous secretions are noted in the lumen of left lower lobe bronchi. Bilateral renal stones. There is prominence collecting systems in the kidneys in the visualized upper poles of both kidneys. This finding is not fully evaluated. If there is clinical suspicion for ureteric obstruction, follow-up CT abdomen and pelvis may be considered. Electronically Signed   By: Elmer Picker M.D.   On: 06/22/2022 13:02   ECHOCARDIOGRAM COMPLETE  Result Date: 06/21/2022    ECHOCARDIOGRAM REPORT   Patient Name:   [name] Date of Exam: 06/21/2022 Medical Rec #:  [id number]        Height:       74.0 in Accession #:    [id number]       Weight:       128.5 lb Date of Birth:  06-04-1935        BSA:          1.801 m Patient Age:    48 years         BP:           125/56 mmHg Patient Gender: M                HR:           80 bpm. Exam Location:  [facility] Procedure: 2D Echo, Cardiac Doppler and Color Doppler Indications:     Dyspnea  History:         Patient has no prior history of Echocardiogram examinations.                  CAD, COPD, Arrythmias:Atrial Fibrillation,                  Signs/Symptoms:Fatigue; Risk Factors:Hypertension, Current                  Smoker, Diabetes and Dyslipidemia. BMI <19.  Sonographer:     Wenda Low Referring Phys:  [id number] Jennye Boroughs Diagnosing Phys: Kathlyn Sacramento MD  Sonographer Comments: Image acquisition challenging due to patient body habitus. IMPRESSIONS  1. Left ventricular ejection fraction, by estimation, is 60 to 65%. The left ventricle has normal function. The left ventricle has no regional wall motion abnormalities. There is mild left ventricular hypertrophy. Left ventricular diastolic parameters were normal.  2. Right ventricular systolic function is normal. The right ventricular size is normal. There is normal pulmonary artery systolic pressure. The  estimated right ventricular systolic pressure is 32.2 mmHg.  3. The mitral valve is normal in structure. Mild mitral valve regurgitation. No evidence of mitral stenosis.  4. The aortic valve is normal in structure. Aortic valve regurgitation is not visualized. Aortic valve sclerosis/calcification is present, without any evidence of aortic stenosis.  5. Aortic dilatation noted. There is borderline dilatation of the aortic root, measuring 39 mm.  6. The inferior vena cava is normal in size with greater than 50% respiratory variability, suggesting right atrial pressure of 3 mmHg. FINDINGS  Left Ventricle: Left ventricular ejection fraction, by estimation, is 60 to 65%. The left ventricle has normal function. The left ventricle has no regional wall motion abnormalities. The left ventricular internal cavity size was normal in size. There is  mild left ventricular hypertrophy. Left ventricular diastolic parameters were normal. Right Ventricle: The right ventricular size is normal. No increase in right ventricular wall thickness. Right ventricular systolic function is normal. There is normal pulmonary artery systolic pressure. The tricuspid regurgitant velocity is 2.76 m/s, and  with an assumed right atrial pressure of 3 mmHg, the estimated right ventricular systolic pressure is 02.5 mmHg. Left Atrium: Left atrial size was normal in size. Right Atrium: Right atrial size was normal in size. Pericardium: There is no evidence of pericardial effusion. Mitral Valve: The mitral valve is normal in structure. Mild mitral valve regurgitation. No evidence of mitral valve stenosis. MV peak gradient, 2.4 mmHg. The mean mitral valve gradient is 1.0 mmHg. Tricuspid Valve: The tricuspid valve is normal in structure. Tricuspid valve regurgitation is not demonstrated. No evidence of tricuspid stenosis. Aortic Valve: The aortic valve is normal in structure. Aortic valve regurgitation is not visualized. Aortic valve sclerosis/calcification  is present, without any evidence of aortic stenosis. Aortic valve mean gradient measures 2.0 mmHg. Aortic valve peak  gradient measures 4.0 mmHg. Aortic valve area, by VTI measures 3.16 cm. Pulmonic Valve: The pulmonic valve was normal in structure. Pulmonic valve regurgitation is not visualized. No evidence of pulmonic stenosis. Aorta: Aortic dilatation noted. There is borderline dilatation of the aortic root, measuring 39 mm. Venous: The inferior vena cava is normal in size with greater than 50% respiratory variability, suggesting right atrial pressure of 3 mmHg. IAS/Shunts: No atrial level shunt detected by color flow Doppler.  LEFT VENTRICLE PLAX 2D LVIDd:         3.90 cm   Diastology LVIDs:         2.30 cm   LV e' medial:    8.27 cm/s LV PW:         0.90 cm   LV E/e' medial:  8.1 LV IVS:        1.30 cm   LV e' lateral:   9.03 cm/s LVOT diam:     2.10 cm   LV E/e' lateral: 7.4 LV SV:         70 LV SV Index:   39 LVOT Area:     3.46 cm  RIGHT VENTRICLE RV Basal diam:  3.35 cm RV Mid diam:    3.00 cm RV S prime:     12.60 cm/s LEFT ATRIUM           Index        RIGHT ATRIUM           Index LA diam:      3.10 cm 1.72 cm/m   RA Area:     14.80 cm LA Vol (A2C): 25.1 ml 13.94 ml/m  RA  Volume:   38.30 ml  21.26 ml/m LA Vol (A4C): 24.0 ml 13.33 ml/m  AORTIC VALVE                    PULMONIC VALVE AV Area (Vmax):    3.53 cm     PV Vmax:       1.12 m/s AV Area (Vmean):   3.20 cm     PV Peak grad:  5.0 mmHg AV Area (VTI):     3.16 cm AV Vmax:           100.00 cm/s AV Vmean:          69.400 cm/s AV VTI:            0.220 m AV Peak Grad:      4.0 mmHg AV Mean Grad:      2.0 mmHg LVOT Vmax:         102.00 cm/s LVOT Vmean:        64.200 cm/s LVOT VTI:          0.201 m LVOT/AV VTI ratio: 0.91  AORTA Ao Root diam: 3.90 cm MITRAL VALVE               TRICUSPID VALVE MV Area (PHT): 2.60 cm    TR Peak grad:   30.5 mmHg MV Area VTI:   3.48 cm    TR Vmax:        276.00 cm/s MV Peak grad:  2.4 mmHg MV Mean grad:  1.0 mmHg     SHUNTS MV Vmax:       0.77 m/s    Systemic VTI:  0.20 m MV Vmean:      44.9 cm/s   Systemic Diam: 2.10 cm MV Decel Time: 292 msec MV E velocity: 67.10 cm/s MV A velocity: 79.70 cm/s MV E/A ratio:  0.84 Kathlyn Sacramento MD Electronically signed by Kathlyn Sacramento MD Signature Date/Time: 06/21/2022/11:59:49 AM    Final        LOS: 5 days   George Hugh  [facility]   Pager on [email or web address]. If 7PM-7AM, please contact night-coverage at [email or web address]   06/22/2022, 5:42 PM

## 2022-06-23 LAB — BASIC METABOLIC PANEL
Anion gap: 11 (ref 5–15)
BUN: 59 mg/dL — ABNORMAL HIGH (ref 8–23)
CO2: 28 mmol/L (ref 22–32)
Calcium: 8.3 mg/dL — ABNORMAL LOW (ref 8.9–10.3)
Chloride: 99 mmol/L (ref 98–111)
Creatinine, Ser: 1.55 mg/dL — ABNORMAL HIGH (ref 0.61–1.24)
GFR, Estimated: 44 mL/min — ABNORMAL LOW (ref >60)
Glucose, Bld: 232 mg/dL — ABNORMAL HIGH (ref 70–99)
Potassium: 4.1 mmol/L (ref 3.5–5.1)
Sodium: 138 mmol/L (ref 135–145)

## 2022-06-23 LAB — CBC
HCT: 34.1 % — ABNORMAL LOW (ref 39.0–52.0)
Hemoglobin: 11.3 g/dL — ABNORMAL LOW (ref 13.0–17.0)
MCH: 28.7 pg (ref 26.0–34.0)
MCHC: 33.1 g/dL (ref 30.0–36.0)
MCV: 86.5 fL (ref 80.0–100.0)
Platelets: 403 10*3/uL — ABNORMAL HIGH (ref 150–400)
RBC: 3.94 MIL/uL — ABNORMAL LOW (ref 4.22–5.81)
RDW: 17 % — ABNORMAL HIGH (ref 11.5–15.5)
WBC: 22.2 10*3/uL — ABNORMAL HIGH (ref 4.0–10.5)
nRBC: 0 % (ref 0.0–0.2)

## 2022-06-23 LAB — GLUCOSE, CAPILLARY
Glucose-Capillary: 233 mg/dL — ABNORMAL HIGH (ref 70–99)
Glucose-Capillary: 191 mg/dL — ABNORMAL HIGH (ref 70–99)
Glucose-Capillary: 383 mg/dL — ABNORMAL HIGH (ref 70–99)
Glucose-Capillary: 280 mg/dL — ABNORMAL HIGH (ref 70–99)

## 2022-06-23 MED ORDER — VANCOMYCIN VARIABLE DOSE PER UNSTABLE RENAL FUNCTION (PHARMACIST DOSING): Status: DC

## 2022-06-23 MED ORDER — SODIUM CHLORIDE 0.9 % IV SOLN
2.0000 g | INTRAVENOUS | Status: AC
  Administered 2022-06-23: 2 g via INTRAVENOUS
  Filled 2022-06-23: qty 12.5

## 2022-06-23 MED ORDER — VANCOMYCIN HCL IN DEXTROSE 1-5 GM/200ML-% IV SOLN: 1000.0000 mg | Freq: Two times a day (BID) | INTRAVENOUS | Status: DC

## 2022-06-23 MED ORDER — VANCOMYCIN HCL 1250 MG/250ML IV SOLN
1250.0000 mg | Freq: Once | INTRAVENOUS | Status: AC
  Administered 2022-06-23: 1250 mg via INTRAVENOUS
  Filled 2022-06-23: qty 250

## 2022-06-23 MED ORDER — SODIUM CHLORIDE 0.9 % IV SOLN: INTRAVENOUS | Status: DC

## 2022-06-23 MED ORDER — ENOXAPARIN SODIUM 30 MG/0.3ML IJ SOSY
30.0000 mg | PREFILLED_SYRINGE | INTRAMUSCULAR | Status: DC
  Administered 2022-06-23: 30 mg via SUBCUTANEOUS
  Filled 2022-06-23: qty 0.3

## 2022-06-23 NOTE — Consult Note (Signed)
Pharmacy Antibiotic Note  [name] is a 87 y.o. male admitted on 06/17/2022 with pneumonia.  Pharmacy has been consulted for vancomycin dosing.  Assessment: 87 yo M with PMH chronic respiratory failure on 2 L [place], COPD, HTN, HLD, T2DM, CAD s/p CABG, dCHF, aortic stenosis, pAF s/p ablation and h/o DVT (taking Eliquis), CKD3, BPH, and severe protein calorie malnutrition presents with dyspnea and hypoxia d/t multifocal pneumonia. Pt is afebrile, VSS stable on 4 L , with leukocytosis. Renal function not stable currently, with Scr trending up (1.09 >> 1.26 >> 1.55). Will give vancomycin loading dose and check a vanc level with AM labs tomorrow.  Plan: Give vancomycin 1250 mg IV x 1 Follow up culture results to assess for antibiotic optimization Monitor renal function to assess for any necessary antibiotic dosing changes  Height: 6\' 2"  (188 cm) Weight: 58.3 kg (128 lb 8.5 oz) IBW/kg (Calculated) : 82.2  Temp (24hrs), Avg:98 F (36.7 C), Min:97.5 F (36.4 C), Max:98.3 F (36.8 C)  Recent Labs  Lab 06/17/22 1043 06/17/22 1300 06/17/22 1438 06/17/22 1859 06/18/22 0500 06/19/22 0510 06/19/22 0608 06/20/22 0214 06/21/22 0847 06/22/22 0537 06/23/22 0554  WBC 9.3  --   --   --  6.9 10.6*  --  14.1* 16.2* 16.8* 22.2*  CREATININE 1.37*  --   --   --  1.10  --  1.11  --  1.09 1.26* 1.55*  LATICACIDVEN 2.0* 2.4* 2.0* 1.6  --   --   --   --   --   --   --     Estimated Creatinine Clearance: 28.7 mL/min (A) (by C-G formula based on SCr of 1.55 mg/dL (H)).    Allergies  Allergen Reactions   Celebrex [Celecoxib] Rash and Other (See Comments)    Blisters, also   Tape Other (See Comments)    EKG leads leave red marks and PLEASE USE COBAN WRAP!! Skin tears and bruises easily    Antimicrobials this admission: 12/25 Cefepime >> 12/30 12/25 Metronidazole >> 12/30 Doxycycline >> 12/30  Dose adjustments this admission: N/A  Microbiology results: 12/25 MRSA PCR: negative 12/25  BCx: NGF 12/25 RSV/Flu/COVID: negative 12/31 MRSA PCR: ordered  Thank you for allowing pharmacy to be a part of this patient's care.  Will M. Ouida Sills, PharmD PGY-1 Pharmacy Resident 06/23/2022 9:11 AM

## 2022-06-23 NOTE — Progress Notes (Signed)
PHARMACIST - PHYSICIAN COMMUNICATION  CONCERNING:  Enoxaparin (Lovenox) for DVT Prophylaxis    RECOMMENDATION: Patient was prescribed enoxaprin 40mg  q24 hours for VTE prophylaxis.   Filed Weights   06/17/22 0958  Weight: 58.3 kg (128 lb 8.5 oz)    Body mass index is 16.5 kg/m.  Estimated Creatinine Clearance: 28.7 mL/min (A) (by C-G formula based on SCr of 1.55 mg/dL (H)).   Patient is candidate for enoxaparin 30mg  every 24 hours based on CrCl <40ml/min  DESCRIPTION: Pharmacy has adjusted enoxaparin dose per [facility] policy.  Patient is now receiving enoxaparin 30 mg every 24 hours    Glean Salvo, PharmD, BCPS Clinical Pharmacist  06/23/2022 1:11 PM

## 2022-06-23 NOTE — Progress Notes (Addendum)
.    Progress Note    [name]  PYK:[id number] DOB: [birth year]  DOA: 06/17/2022 PCP: Susy Frizzle, MD    Brief Narrative:    Medical records reviewed and are as summarized below:  [name] is a 87 y.o. male with a PMH of Chronic Hypoxic Respiratory Failure on 2L [place] at night, COPD, HTN, HL, DM2, CAD s/p CABG, Chronic Diastolic Heart Failure, severe Aortic Stenosis, PAF  s/p ablation and history of DVT (on Eliquis), CKD3, BPH, and severe protein calorie malnutrition who presented to the ED with acute dyspnea and acute hypoxia due to multifocal pneumonia and acute COPD exacerbation.  Patient was treated with antibiotics and steroids.  Of note patient was admitted on 12/18 for pneumonia.  He initially improved but feels a little tired and more short of breath today.  Chest CT shows no evidence of a PE.   Assessment/Plan:   Principal Problem:   Bilateral pneumonia Active Problems:   COPD exacerbation (HCC)   Acute and chronic respiratory failure with hypoxia (HCC)   Severe sepsis (HCC)   Essential hypertension   Chronic kidney disease, stage 3a (HCC)   Type II diabetes mellitus with renal manifestations (HCC)   Back pain   BPH (benign prostatic hyperplasia)   Paroxysmal atrial fibrillation (HCC)   Cerebrovascular disease   Cigarette smoker   Depression with anxiety   Protein-calorie malnutrition, severe   Body mass index is 16.5 kg/m.  (Underweight)  Acute on Chronic Hypoxic Respiratory Failure: - Oxygen is currently at 4L [place].  His baseline ~ 2L [place].    Hypotension, resolved  Multifocal Pneumonia: CT shows bilateral opacities.  Patient has worsening leukocytosis despite being on cefepime day 7 of 7 and Flagyl.  Added vancomycin.  Follow blood cultures continue to trend white count - Given patient's clinical change today and recent diagnosis of pneumonia on 12/18. -Patient was placed on Ventimask secondary to not keeping his nasal cannula on. On 5 to 6 L  of oxygen  AKI : Patient renal function has gradually worsened from 1.09 -1.26 now 1.55 with elevated BUN likely secondary to dehydration.  Will hold Lasix.  Continue to monitor input output. Will start him on gentle hydration. @ 75 cc/hr .  Acute COPD Exacerbation / Chronic Emphysema : Patient has poor residual lung function and continues to have bibasilar wheezing and is short of breath.  He will need a prolonged course of steroids. - This is Day 8 out of 10 of steroids. - Continue nebs and start Pulmicort.  Chronic Diastolic Heart Failure / Small Bilateral Pleural Effusions:  - Now with worsening renal function. Hold Lasix.   Coronary Artery Disease s/p CABG / Atherosclerosis / History of Stroke: - Aspirin and Statin.  PAF: - Patient follows with Cardiologist Dr. Stanford Breed.  Bilateral Renal Stones: - Abdominal exam is stable.  GERD: - PPI  DM2: Glucose is a little elevated on steroids. - Increase NPH from 10 to 12 units am and 14 to 18 units pm. - Continue Ssi with POC glucose ACHS.  Lower Back Pain: - Lumbar CT showed no acute abnormality.  BPH: Flomax  Prior Nasal Fracture: Follow up with ENT outpatient. Hearing Loss  Chronic Tobacco Use: - Counseled on cessation.  RUL Lung Nodule: - Monitor outpatient  Severe Protein Calorie Malnutrition: - Ensure TID.  Code Status: DNR/DNI   Diet Order             Diet regular Room service appropriate? Yes;  Fluid consistency: Thin  Diet effective now                   Consultants: None  Procedures: None    Medications:    aspirin  325 mg Oral Daily   budesonide (PULMICORT) nebulizer solution  0.5 mg Nebulization BID   enoxaparin (LOVENOX) injection  30 mg Subcutaneous Q24H   feeding supplement  237 mL Oral TID BM   furosemide  20 mg Intravenous BID   insulin aspart  0-15 Units Subcutaneous TID WC   insulin aspart  0-5 Units Subcutaneous QHS   insulin aspart protamine- aspart  12 Units Subcutaneous Q  breakfast   insulin aspart protamine- aspart  18 Units Subcutaneous Q supper   ipratropium-albuterol  3 mL Nebulization TID   multivitamin with minerals  1 tablet Oral Daily   nicotine  21 mg Transdermal Daily   mouth rinse  15 mL Mouth Rinse 4 times per day   pantoprazole  40 mg Oral Daily   pravastatin  40 mg Oral QHS   tamsulosin  0.4 mg Oral Daily   vancomycin variable dose per unstable renal function (pharmacist dosing)   Does not apply See admin instructions   Continuous Infusions:  ceFEPime (MAXIPIME) IV     metronidazole 500 mg (06/23/22 0301)     Anti-infectives (From admission, onward)    Start     Dose/Rate Route Frequency Ordered Stop   06/23/22 2200  ceFEPIme (MAXIPIME) 2 g in sodium chloride 0.9 % 100 mL IVPB        2 g 200 mL/hr over 30 Minutes Intravenous Every 24 hours 06/23/22 0845 06/24/22 2159   06/23/22 1000  vancomycin (VANCOREADY) IVPB 1250 mg/250 mL        1,250 mg 166.7 mL/hr over 90 Minutes Intravenous  Once 06/23/22 0844 06/23/22 1134   06/23/22 0930  vancomycin (VANCOCIN) IVPB 1000 mg/200 mL premix  Status:  Discontinued        1,000 mg 200 mL/hr over 60 Minutes Intravenous Every 12 hours 06/23/22 0830 06/23/22 0841   06/23/22 0843  vancomycin variable dose per unstable renal function (pharmacist dosing)         Does not apply See admin instructions 06/23/22 0844     06/22/22 1800  doxycycline (VIBRA-TABS) tablet 100 mg  Status:  Discontinued        100 mg Oral Every 12 hours 06/22/22 1751 06/23/22 0829   06/18/22 1200  vancomycin (VANCOCIN) 750 mg in sodium chloride 0.9 % 250 mL IVPB  Status:  Discontinued        750 mg 250 mL/hr over 60 Minutes Intravenous Every 24 hours 06/17/22 1628 06/18/22 0533   06/18/22 1200  vancomycin (VANCOREADY) IVPB 750 mg/150 mL  Status:  Discontinued        750 mg 150 mL/hr over 60 Minutes Intravenous Every 24 hours 06/18/22 0533 06/18/22 1125   06/17/22 2200  ceFEPIme (MAXIPIME) 2 g in sodium chloride 0.9 % 100 mL  IVPB  Status:  Discontinued        2 g 200 mL/hr over 30 Minutes Intravenous Every 12 hours 06/17/22 1628 06/23/22 0845   06/17/22 1600  metroNIDAZOLE (FLAGYL) IVPB 500 mg        500 mg 100 mL/hr over 60 Minutes Intravenous Every 12 hours 06/17/22 1553 06/23/22 2359   06/17/22 1200  vancomycin (VANCOCIN) IVPB 1000 mg/200 mL premix        1,000 mg 200 mL/hr over 60 Minutes  Intravenous  Once 06/17/22 1154 06/17/22 1448   06/17/22 1200  ceFEPIme (MAXIPIME) 2 g in sodium chloride 0.9 % 100 mL IVPB        2 g 200 mL/hr over 30 Minutes Intravenous  Once 06/17/22 1154 06/17/22 1330        Family Communication/Anticipated D/C date and plan/Code Status   DVT prophylaxis: enoxaparin (LOVENOX) injection 30 mg Start: 06/23/22 2200     Code Status: DNR  Family Communication: Loma Sousa, daughter-in-law, at the bedside Disposition Plan: PT/OT recommends Home with Home Health.  Discharge once clinically improves and oxygen requirements improve to 2L [place].  Patient has home oxygen at home.  Mr. Koudelka lives alone at home - I encouraged him to live with family (daughter or son) after discharge - this will need to be re-addressed when patient is feeling a little better.     Status is: Inpatient Remains inpatient appropriate because: pneumonia and acute hypoxia    Subjective:   Mr. Rietz symptoms are a little worse today. He endorses increased shortness of breath. He also appears fatigued and malaise I spoke with family at bedside and we discussed his discharge plan.  I recommend patient live with family and not alone.  Objective:    Vitals:   06/23/22 0711 06/23/22 0831 06/23/22 1130 06/23/22 1400  BP:  128/73 (!) 106/51   Pulse:  82 85   Resp:  18 (!) 22   Temp:  (!) 97.5 F (36.4 C) 97.8 F (36.6 C)   TempSrc:  Oral Oral   SpO2: 98% 93% 93% 92%  Weight:      Height:       No data found.   Intake/Output Summary (Last 24 hours) at 06/23/2022 1434 Last data filed at 06/23/2022  0900 Gross per 24 hour  Intake 1290 ml  Output 2400 ml  Net -1110 ml    Filed Weights   06/17/22 0958  Weight: 58.3 kg    Exam:  Physical Exam Constitutional:      Comments: Chronically ill Acute respiratory distress Fatigue malaise  malnourished  HENT:     Head: Normocephalic and atraumatic.     Mouth/Throat:     Mouth: Mucous membranes are moist.  Eyes:     Extraocular Movements: Extraocular movements intact.     Pupils: Pupils are equal, round, and reactive to light.  Cardiovascular:     Rate and Rhythm: Normal rate and regular rhythm.     Pulses: Normal pulses.     Heart sounds: Normal heart sounds.  Pulmonary:     Effort: Pulmonary effort is normal.     Breath sounds: Wheezing present.  Abdominal:     General: There is no distension.     Palpations: Abdomen is soft.     Tenderness: There is no abdominal tenderness.  Musculoskeletal:        General: Normal range of motion.     Cervical back: Normal range of motion and neck supple.  Skin:    General: Skin is warm.     Capillary Refill: Capillary refill takes less than 2 seconds.  Neurological:     General: No focal deficit present.  Psychiatric:        Mood and Affect: Mood normal.      Pressure Injury 06/11/22 Sacrum Mid Stage 2 -  Partial thickness loss of dermis presenting as a shallow open injury with a red, pink wound bed without slough. (Active)  06/11/22 2300  Location: Sacrum  Location Orientation:  Mid  Staging: Stage 2 -  Partial thickness loss of dermis presenting as a shallow open injury with a red, pink wound bed without slough.  Wound Description (Comments):   Present on Admission: Yes  Dressing Type Foam - Lift dressing to assess site every shift 06/22/22 2000     Data Reviewed:   I have personally reviewed following labs and imaging studies:  Labs: Labs show the following:   Basic Metabolic Panel: Recent Labs  Lab 06/18/22 0500 06/19/22 0608 06/21/22 0847 06/22/22 0537  06/23/22 0554  NA 137 141 138 135 138  K 4.4 4.1 3.9 3.9 4.1  CL 109 112* 102 98 99  CO2 19* 23 27 29 28   GLUCOSE 206* 138* 255* 301* 232*  BUN 26* 29* 36* 51* 59*  CREATININE 1.10 1.11 1.09 1.26* 1.55*  CALCIUM 8.0* 8.2* 8.4* 8.3* 8.3*  MG  --   --  1.7 1.9  --     GFR Estimated Creatinine Clearance: 28.7 mL/min (A) (by C-G formula based on SCr of 1.55 mg/dL (H)). Liver Function Tests: Recent Labs  Lab 06/17/22 1043  AST 35  ALT 23  ALKPHOS 55  BILITOT 1.2  PROT 7.6  ALBUMIN 2.9*     CBC: Recent Labs  Lab 06/17/22 1043 06/18/22 0500 06/19/22 0510 06/20/22 0214 06/21/22 0847 06/22/22 0537 06/23/22 0554  WBC 9.3   < > 10.6* 14.1* 16.2* 16.8* 22.2*  NEUTROABS 6.0  --  8.0* 10.4* 12.8* 13.4*  --   HGB 10.7*   < > 7.1* 10.5* 12.3* 11.7* 11.3*  HCT 33.4*   < > 22.1* 31.6* 36.6* 35.4* 34.1*  MCV 87.9   < > 89.5 86.6 85.5 86.1 86.5  PLT 400   < > 267 346 387 377 403*   < > = values in this interval not displayed.     CBG: Recent Labs  Lab 06/22/22 1125 06/22/22 1511 06/22/22 2136 06/23/22 0827 06/23/22 1132  GLUCAP 256* 309* 307* 233* 191*     Sepsis Labs: Recent Labs  Lab 06/17/22 1043 06/17/22 1300 06/17/22 1438 06/17/22 1859 06/18/22 0500 06/20/22 0214 06/21/22 0847 06/22/22 0537 06/23/22 0554  WBC 9.3  --   --   --    < > 14.1* 16.2* 16.8* 22.2*  LATICACIDVEN 2.0* 2.4* 2.0* 1.6  --   --   --   --   --    < > = values in this interval not displayed.     Microbiology Recent Results (from the past 240 hour(s))  Resp panel by RT-PCR (RSV, Flu A&B, Covid) Anterior Nasal Swab     Status: None   Collection Time: 06/17/22 10:40 AM   Specimen: Anterior Nasal Swab  Result Value Ref Range Status   SARS Coronavirus 2 by RT PCR NEGATIVE NEGATIVE Final    Comment: (NOTE) SARS-CoV-2 target nucleic acids are NOT DETECTED.  The SARS-CoV-2 RNA is generally detectable in upper respiratory specimens during the acute phase of infection. The  lowest concentration of SARS-CoV-2 viral copies this assay can detect is 138 copies/mL. A negative result does not preclude SARS-Cov-2 infection and should not be used as the sole basis for treatment or other patient management decisions. A negative result may occur with  improper specimen collection/handling, submission of specimen other than nasopharyngeal swab, presence of viral mutation(s) within the areas targeted by this assay, and inadequate number of viral copies(<138 copies/mL). A negative result must be combined with clinical observations, patient history, and epidemiological information. The  expected result is Negative.  Fact Sheet for Patients:  EntrepreneurPulse.com.au  Fact Sheet for Healthcare Providers:  IncredibleEmployment.be  This test is no t yet approved or cleared by the [place] FDA and  has been authorized for detection and/or diagnosis of SARS-CoV-2 by FDA under an Emergency Use Authorization (EUA). This EUA will remain  in effect (meaning this test can be used) for the duration of the COVID-19 declaration under Section 564(b)(1) of the Act, 21 U.S.C.section 360bbb-3(b)(1), unless the authorization is terminated  or revoked sooner.       Influenza A by PCR NEGATIVE NEGATIVE Final   Influenza B by PCR NEGATIVE NEGATIVE Final    Comment: (NOTE) The Xpert Xpress SARS-CoV-2/FLU/RSV plus assay is intended as an aid in the diagnosis of influenza from Nasopharyngeal swab specimens and should not be used as a sole basis for treatment. Nasal washings and aspirates are unacceptable for Xpert Xpress SARS-CoV-2/FLU/RSV testing.  Fact Sheet for Patients: EntrepreneurPulse.com.au  Fact Sheet for Healthcare Providers: IncredibleEmployment.be  This test is not yet approved or cleared by the [place] FDA and has been authorized for detection and/or diagnosis of SARS-CoV-2 by FDA under  an Emergency Use Authorization (EUA). This EUA will remain in effect (meaning this test can be used) for the duration of the COVID-19 declaration under Section 564(b)(1) of the Act, 21 U.S.C. section 360bbb-3(b)(1), unless the authorization is terminated or revoked.     Resp Syncytial Virus by PCR NEGATIVE NEGATIVE Final    Comment: (NOTE) Fact Sheet for Patients: EntrepreneurPulse.com.au  Fact Sheet for Healthcare Providers: IncredibleEmployment.be  This test is not yet approved or cleared by the [place] FDA and has been authorized for detection and/or diagnosis of SARS-CoV-2 by FDA under an Emergency Use Authorization (EUA). This EUA will remain in effect (meaning this test can be used) for the duration of the COVID-19 declaration under Section 564(b)(1) of the Act, 21 U.S.C. section 360bbb-3(b)(1), unless the authorization is terminated or revoked.  Performed at [facility], [address] [place], [place] [address]   Culture, blood (routine x 2)     Status: None   Collection Time: 06/17/22 10:43 AM   Specimen: BLOOD RIGHT FOREARM  Result Value Ref Range Status   Specimen Description BLOOD RIGHT FOREARM  Final   Special Requests   Final    BOTTLES DRAWN AEROBIC AND ANAEROBIC Blood Culture adequate volume   Culture   Final    NO GROWTH 5 DAYS Performed at [facility], [address] [place], [place] [address]    Report Status 06/22/2022 FINAL  Final  Culture, blood (routine x 2)     Status: None   Collection Time: 06/17/22 10:43 AM   Specimen: BLOOD RIGHT HAND  Result Value Ref Range Status   Specimen Description BLOOD RIGHT HAND  Final   Special Requests   Final    BOTTLES DRAWN AEROBIC AND ANAEROBIC Blood Culture adequate volume   Culture   Final    NO GROWTH 5 DAYS Performed at [facility], [address], [place], [place] [address]    Report Status 06/22/2022 FINAL  Final  MRSA Next Gen  by PCR, Nasal     Status: None   Collection Time: 06/17/22  6:59 PM   Specimen: Nasal Mucosa; Nasal Swab  Result Value Ref Range Status   MRSA by PCR Next Gen NOT DETECTED NOT DETECTED Final    Comment: (NOTE) The GeneXpert MRSA Assay (FDA approved for NASAL specimens  only), is one component of a comprehensive MRSA colonization surveillance program. It is not intended to diagnose MRSA infection nor to guide or monitor treatment for MRSA infections. Test performance is not FDA approved in patients less than 19 years old. Performed at [facility], [address] [place], [place] [address]     Procedures and diagnostic studies:  CT Angio Chest Pulmonary Embolism (PE) W or WO Contrast  Result Date: 06/22/2022 CLINICAL DATA:  Chronic dyspnea, pulmonary embolism suspected, hypoxia EXAM: CT ANGIOGRAPHY CHEST WITH CONTRAST TECHNIQUE: Multidetector CT imaging of the chest was performed using the standard protocol during bolus administration of intravenous contrast. Multiplanar CT image reconstructions and MIPs were obtained to evaluate the vascular anatomy. RADIATION DOSE REDUCTION: This exam was performed according to the departmental dose-optimization program which includes automated exposure control, adjustment of the mA and/or kV according to patient size and/or use of iterative reconstruction technique. CONTRAST:  91mL OMNIPAQUE IOHEXOL 350 MG/ML SOLN COMPARISON:  CT done on 06/17/2022, chest radiographs done on 06/20/2022 FINDINGS: Cardiovascular: There are no intraluminal filling defects in pulmonary artery branches. Coronary artery calcifications are seen. There is previous coronary bypass surgery. Contrast density in thoracic aorta is less than optimal to evaluate the lumen. There are atherosclerotic plaques and calcifications in thoracic aorta. Mediastinum/Nodes: No interval changes are noted in slightly enlarged lymph nodes in mediastinum and hilar regions. Lungs/Pleura:  Centrilobular and panlobular emphysema is seen. Blebs and bullae are seen in both apices. There are linear densities in both apices suggesting scarring. There is possible ectatic bronchus in the anterior aspect of right upper lung field with no significant change. Small patchy infiltrate is seen in right middle lobe with slight improvement. There is interval decrease in patchy infiltrates in right lower lobe. New infiltrate is seen in lingula. Large new infiltrates are noted in left lower lobe. There is interval appearance of small left pleural effusion. There is fluid density in the lumen of left lower lobe bronchi. There is no pneumothorax. Upper Abdomen: Left renal pelvis is prominent. There is prominence of minor calices in the upper poles of both kidneys. Kidneys are not included in their entirety limiting evaluation. There is 5 mm calcific density in the upper pole of right kidney. There is 2 mm calcific density in the upper pole of left kidney. Musculoskeletal: No acute findings are seen in bony structures. Review of the MIP images confirms the above findings. IMPRESSION: There is no evidence of pulmonary embolism. Coronary artery disease. Aortic atherosclerosis. COPD. There are patchy infiltrates in both lungs with slight improvement in the infiltrate in right lower lobe and marked interval worsening of infiltrates in left lower lobe. New small patchy infiltrate is seen in lingula. Findings suggest multifocal pneumonia with interval worsening in the left lung. There is interval appearance of small left pleural effusion. Mucous secretions are noted in the lumen of left lower lobe bronchi. Bilateral renal stones. There is prominence collecting systems in the kidneys in the visualized upper poles of both kidneys. This finding is not fully evaluated. If there is clinical suspicion for ureteric obstruction, follow-up CT abdomen and pelvis may be considered. Electronically Signed   By: Elmer Picker M.D.    On: 06/22/2022 13:02       LOS: 6 days   [facility]   Pager on [email or web address]. If 7PM-7AM, please contact night-coverage at [email or web address]   06/23/2022, 2:34 PM

## 2022-06-24 DIAGNOSIS — J441 Chronic obstructive pulmonary disease with (acute) exacerbation: Secondary | ICD-10-CM | POA: Diagnosis not present

## 2022-06-24 DIAGNOSIS — J9621 Acute and chronic respiratory failure with hypoxia: Secondary | ICD-10-CM | POA: Diagnosis not present

## 2022-06-24 LAB — GLUCOSE, CAPILLARY
Glucose-Capillary: 126 mg/dL — ABNORMAL HIGH (ref 70–99)
Glucose-Capillary: 116 mg/dL — ABNORMAL HIGH (ref 70–99)
Glucose-Capillary: 202 mg/dL — ABNORMAL HIGH (ref 70–99)
Glucose-Capillary: 84 mg/dL (ref 70–99)

## 2022-06-24 LAB — COMPREHENSIVE METABOLIC PANEL
ALT: 15 U/L (ref 0–44)
AST: 18 U/L (ref 15–41)
Albumin: 2.5 g/dL — ABNORMAL LOW (ref 3.5–5.0)
Alkaline Phosphatase: 57 U/L (ref 38–126)
Anion gap: 10 (ref 5–15)
BUN: 53 mg/dL — ABNORMAL HIGH (ref 8–23)
CO2: 28 mmol/L (ref 22–32)
Calcium: 8.2 mg/dL — ABNORMAL LOW (ref 8.9–10.3)
Chloride: 100 mmol/L (ref 98–111)
Creatinine, Ser: 1.22 mg/dL (ref 0.61–1.24)
GFR, Estimated: 58 mL/min — ABNORMAL LOW (ref >60)
Glucose, Bld: 104 mg/dL — ABNORMAL HIGH (ref 70–99)
Potassium: 3.8 mmol/L (ref 3.5–5.1)
Sodium: 138 mmol/L (ref 135–145)
Total Bilirubin: 0.9 mg/dL (ref 0.3–1.2)
Total Protein: 6.9 g/dL (ref 6.5–8.1)

## 2022-06-24 LAB — CBC
HCT: 36.2 % — ABNORMAL LOW (ref 39.0–52.0)
Hemoglobin: 11.7 g/dL — ABNORMAL LOW (ref 13.0–17.0)
MCH: 28.3 pg (ref 26.0–34.0)
MCHC: 32.3 g/dL (ref 30.0–36.0)
MCV: 87.4 fL (ref 80.0–100.0)
Platelets: 409 10*3/uL — ABNORMAL HIGH (ref 150–400)
RBC: 4.14 MIL/uL — ABNORMAL LOW (ref 4.22–5.81)
RDW: 16.9 % — ABNORMAL HIGH (ref 11.5–15.5)
WBC: 22.6 10*3/uL — ABNORMAL HIGH (ref 4.0–10.5)
nRBC: 0 % (ref 0.0–0.2)

## 2022-06-24 LAB — VANCOMYCIN, RANDOM: Vancomycin Rm: 13 ug/mL

## 2022-06-24 MED ORDER — ENOXAPARIN SODIUM 40 MG/0.4ML IJ SOSY
40.0000 mg | PREFILLED_SYRINGE | INTRAMUSCULAR | Status: DC
  Administered 2022-06-24 – 2022-06-26 (×3): 40 mg via SUBCUTANEOUS
  Filled 2022-06-24 (×3): qty 0.4

## 2022-06-24 MED ORDER — CHLORHEXIDINE GLUCONATE CLOTH 2 % EX PADS
6.0000 | MEDICATED_PAD | Freq: Every day | CUTANEOUS | Status: DC
  Administered 2022-06-24 – 2022-06-29 (×6): 6 via TOPICAL

## 2022-06-24 MED ORDER — SODIUM CHLORIDE 0.9 % IV SOLN
2.0000 g | Freq: Two times a day (BID) | INTRAVENOUS | Status: AC
  Administered 2022-06-24 – 2022-06-26 (×6): 2 g via INTRAVENOUS
  Filled 2022-06-24: qty 2
  Filled 2022-06-24 (×5): qty 12.5

## 2022-06-24 MED ORDER — VANCOMYCIN HCL 750 MG/150ML IV SOLN
750.0000 mg | INTRAVENOUS | Status: DC
  Administered 2022-06-24 – 2022-06-25 (×2): 750 mg via INTRAVENOUS
  Filled 2022-06-24 (×4): qty 150

## 2022-06-24 NOTE — Consult Note (Signed)
Pharmacy Antibiotic Note  [name] is a 88 y.o. male admitted on 06/17/2022 with pneumonia.  Pharmacy has been consulted for vancomycin dosing.  Assessment: 88 yo M with PMH chronic respiratory failure on 2 L [place], COPD, HTN, HLD, T2DM, CAD s/p CABG, dCHF, aortic stenosis, pAF s/p ablation and h/o DVT (taking Eliquis), CKD3, BPH, and severe protein calorie malnutrition presents with dyspnea and hypoxia d/t multifocal pneumonia. Pt is afebrile, VSS stable on 4 L [place], with leukocytosis. Renal function not stable currently, with Scr trending up (1.09 >> 1.26 >> 1.55). Will give vancomycin loading dose and check a vanc level with AM labs tomorrow.  -1/1: per MD note 12/31: Patient has worsening leukocytosis despite being on cefepime day 7 of 7 and Flagyl. Added vancomycin.    WBC 16.8 >> 22.6   Plan: Patient received vancomycin 1250 mg IV x 1 on 12/31. Scr improved.  Random Vancomycin level 1/1 0508= 13 mcg/ml. Will continue Vancomycin dosing with 750 mg IV q24h Goal AUC 400-550. Expected AUC: 514 SCr used: 1.22  Note:  Total BW< IBW   Follow up culture results to assess for antibiotic optimization Monitor renal function to assess for any necessary antibiotic dosing changes   Height: 6\' 2"  (188 cm) Weight: 58.3 kg (128 lb 8.5 oz) IBW/kg (Calculated) : 82.2  Temp (24hrs), Avg:98.3 F (36.8 C), Min:97.9 F (36.6 C), Max:98.6 F (37 C)  Recent Labs  Lab 06/17/22 1300 06/17/22 1438 06/17/22 1859 06/18/22 0500 06/19/22 0608 06/20/22 0214 06/21/22 0847 06/22/22 0537 06/23/22 0554 06/24/22 0508  WBC  --   --   --    < >  --  14.1* 16.2* 16.8* 22.2* 22.6*  CREATININE  --   --   --    < > 1.11  --  1.09 1.26* 1.55* 1.22  LATICACIDVEN 2.4* 2.0* 1.6  --   --   --   --   --   --   --   VANCORANDOM  --   --   --   --   --   --   --   --   --  13   < > = values in this interval not displayed.     Estimated Creatinine Clearance: 36.5 mL/min (by C-G formula based on SCr of 1.22  mg/dL).    Allergies  Allergen Reactions   Celebrex [Celecoxib] Rash and Other (See Comments)    Blisters, also   Tape Other (See Comments)    EKG leads leave red marks and PLEASE USE COBAN WRAP!! Skin tears and bruises easily    Antimicrobials this admission: 12/25 Cefepime >> 12/30,    1/1>> 12/25 Metronidazole >> 12/30 Doxycycline >> 12/30 12/31 vancomycin >>  Dose adjustments this admission: N/A  Microbiology results: 12/25 MRSA PCR: negative 12/25 BCx: NGF 12/25 RSV/Flu/COVID: negative 12/31 MRSA PCR: ordered  Thank you for allowing pharmacy to be a part of this patient's care.  Chinita Greenland PharmD Clinical Pharmacist 06/24/2022

## 2022-06-24 NOTE — Progress Notes (Signed)
.    Progress Note    [name]  YQM:[id number] DOB: [birth year]  DOA: 06/17/2022 PCP: Susy Frizzle, MD    Brief Narrative:    Medical records reviewed and are as summarized below:  [name] is a 88 y.o. male with a PMH of Chronic Hypoxic Respiratory Failure on 2L [place] at night, COPD, HTN, HL, DM2, CAD s/p CABG, Chronic Diastolic Heart Failure, severe Aortic Stenosis, PAF  s/p ablation and history of DVT (on Eliquis), CKD3, BPH, and severe protein calorie malnutrition who presented to the ED with acute dyspnea and acute hypoxia due to multifocal pneumonia and acute COPD exacerbation.  Patient was treated with antibiotics and steroids.  Of note patient was admitted on 12/18 for pneumonia.  He initially improved but feels a little tired and more short of breath the last couple days.   Chest CT shows no evidence of a PE.   Assessment/Plan:   Principal Problem:   Bilateral pneumonia Active Problems:   COPD exacerbation (HCC)   Acute and chronic respiratory failure with hypoxia (HCC)   Severe sepsis (HCC)   Essential hypertension   Chronic kidney disease, stage 3a (HCC)   Type II diabetes mellitus with renal manifestations (HCC)   Back pain   BPH (benign prostatic hyperplasia)   Paroxysmal atrial fibrillation (HCC)   Cerebrovascular disease   Cigarette smoker   Depression with anxiety   Protein-calorie malnutrition, severe   Body mass index is 16.5 kg/m.  (Underweight)  Acute on Chronic Hypoxic Respiratory Failure: - Oxygen is currently at 8L [place].  His baseline ~ 2L [place].    Hypotension, resolved  Multifocal Pneumonia: CT shows bilateral opacities.  Patient has worsening leukocytosis despite being on cefepime day 7 of 7 and Flagyl.  Added vancomycin 12/31, will plan to continue Cefepime another 3 days for total 10 day course.  Follow blood cultures continue to trend white count - Given patient's clinical change today and recent diagnosis of pneumonia on  12/18. -Patient was placed on Ventimask secondary to not keeping his nasal cannula on.   AKI : Patient renal function has gradually worsened from 1.09 -1.26 1.55 with elevated BUN likely secondary to dehydration.  Lasix being held, on gentle IVF will continue this as renal function is improving now.  Acute COPD Exacerbation / Chronic Emphysema : Patient has poor residual lung function and continues to have bibasilar wheezing and is short of breath.  He will need a prolonged course of steroids. - This is Day 9 out of 10 of steroids, currently no wheezing. - Continue nebs and Pulmicort.  Chronic Diastolic Heart Failure / Small Bilateral Pleural Effusions:  - Now with worsening renal function. Hold Lasix.   Coronary Artery Disease s/p CABG / Atherosclerosis / History of Stroke: - Aspirin and Statin.  PAF: - Patient follows with Cardiologist Dr. Stanford Breed.  Bilateral Renal Stones: - Abdominal exam is stable.  GERD: - PPI  DM2: Glucose is a little elevated on steroids. - Increase NPH from 10 to 12 units am and 14 to 18 units pm. - Continue Ssi with POC glucose ACHS.  Lower Back Pain: - Lumbar CT showed no acute abnormality.  BPH: Flomax  Prior Nasal Fracture: Follow up with ENT outpatient. Hearing Loss  Chronic Tobacco Use: - Counseled on cessation.  RUL Lung Nodule: - Monitor outpatient  Severe Protein Calorie Malnutrition: - Ensure TID.  Code Status: DNR/DNI   Diet Order  Diet regular Room service appropriate? Yes; Fluid consistency: Thin  Diet effective now                   Consultants: None  Procedures: None    Medications:    aspirin  325 mg Oral Daily   budesonide (PULMICORT) nebulizer solution  0.5 mg Nebulization BID   Chlorhexidine Gluconate Cloth  6 each Topical Daily   enoxaparin (LOVENOX) injection  40 mg Subcutaneous Q24H   feeding supplement  237 mL Oral TID BM   insulin aspart  0-15 Units Subcutaneous TID WC   insulin  aspart  0-5 Units Subcutaneous QHS   insulin aspart protamine- aspart  12 Units Subcutaneous Q breakfast   insulin aspart protamine- aspart  18 Units Subcutaneous Q supper   ipratropium-albuterol  3 mL Nebulization TID   multivitamin with minerals  1 tablet Oral Daily   nicotine  21 mg Transdermal Daily   mouth rinse  15 mL Mouth Rinse 4 times per day   pantoprazole  40 mg Oral Daily   pravastatin  40 mg Oral QHS   tamsulosin  0.4 mg Oral Daily   vancomycin variable dose per unstable renal function (pharmacist dosing)   Does not apply See admin instructions   Continuous Infusions:  sodium chloride 75 mL/hr at 06/23/22 1514   ceFEPime (MAXIPIME) IV      Anti-infectives (From admission, onward)    Start     Dose/Rate Route Frequency Ordered Stop   06/24/22 1000  ceFEPIme (MAXIPIME) 2 g in sodium chloride 0.9 % 100 mL IVPB        2 g 200 mL/hr over 30 Minutes Intravenous Every 12 hours 06/24/22 0954 06/27/22 0959   06/23/22 2200  ceFEPIme (MAXIPIME) 2 g in sodium chloride 0.9 % 100 mL IVPB        2 g 200 mL/hr over 30 Minutes Intravenous Every 24 hours 06/23/22 0845 06/23/22 2215   06/23/22 1000  vancomycin (VANCOREADY) IVPB 1250 mg/250 mL        1,250 mg 166.7 mL/hr over 90 Minutes Intravenous  Once 06/23/22 0844 06/23/22 1134   06/23/22 0930  vancomycin (VANCOCIN) IVPB 1000 mg/200 mL premix  Status:  Discontinued        1,000 mg 200 mL/hr over 60 Minutes Intravenous Every 12 hours 06/23/22 0830 06/23/22 0841   06/23/22 0843  vancomycin variable dose per unstable renal function (pharmacist dosing)         Does not apply See admin instructions 06/23/22 0844     06/22/22 1800  doxycycline (VIBRA-TABS) tablet 100 mg  Status:  Discontinued        100 mg Oral Every 12 hours 06/22/22 1751 06/23/22 0829   06/18/22 1200  vancomycin (VANCOCIN) 750 mg in sodium chloride 0.9 % 250 mL IVPB  Status:  Discontinued        750 mg 250 mL/hr over 60 Minutes Intravenous Every 24 hours 06/17/22 1628  06/18/22 0533   06/18/22 1200  vancomycin (VANCOREADY) IVPB 750 mg/150 mL  Status:  Discontinued        750 mg 150 mL/hr over 60 Minutes Intravenous Every 24 hours 06/18/22 0533 06/18/22 1125   06/17/22 2200  ceFEPIme (MAXIPIME) 2 g in sodium chloride 0.9 % 100 mL IVPB  Status:  Discontinued        2 g 200 mL/hr over 30 Minutes Intravenous Every 12 hours 06/17/22 1628 06/23/22 0845   06/17/22 1600  metroNIDAZOLE (FLAGYL) IVPB 500 mg  500 mg 100 mL/hr over 60 Minutes Intravenous Every 12 hours 06/17/22 1553 06/23/22 1615   06/17/22 1200  vancomycin (VANCOCIN) IVPB 1000 mg/200 mL premix        1,000 mg 200 mL/hr over 60 Minutes Intravenous  Once 06/17/22 1154 06/17/22 1448   06/17/22 1200  ceFEPIme (MAXIPIME) 2 g in sodium chloride 0.9 % 100 mL IVPB        2 g 200 mL/hr over 30 Minutes Intravenous  Once 06/17/22 1154 06/17/22 1330        Family Communication/Anticipated D/C date and plan/Code Status   DVT prophylaxis: enoxaparin (LOVENOX) injection 40 mg Start: 06/24/22 2200     Code Status: DNR  Family Communication: Loma Sousa, daughter-in-law, at the bedside this AM.  Disposition Plan: PT/OT recommends Home with Home Health.  Discharge once clinically improves and oxygen requirements improve to 2L [place].  Patient has home oxygen at home.  [name] lives alone at home - I encouraged him to live with family (daughter or son) after discharge - this will need to be re-addressed when patient is feeling a little better.    Status is: Inpatient Remains inpatient appropriate because: pneumonia and acute hypoxia  Subjective:   [name] symptoms are stable today, remains with shortness of breath. He also appears fatigued and malaise I spoke with family at bedside this AM.  Objective:    Vitals:   06/24/22 0318 06/24/22 0717 06/24/22 0840 06/24/22 0845  BP: (!) 142/86   (!) 131/52  Pulse: 70   87  Resp: 19   20  Temp: 98.6 F (37 C)   98.4 F (36.9 C)  TempSrc: Oral    Oral  SpO2: 93% 91% (!) 87% 93%  Weight:      Height:       No data found.   Intake/Output Summary (Last 24 hours) at 06/24/2022 0958 Last data filed at 06/24/2022 0600 Gross per 24 hour  Intake 731.25 ml  Output 2250 ml  Net -1518.75 ml    Filed Weights   06/17/22 0958  Weight: 58.3 kg    Exam:  Physical Exam Vitals reviewed.  Constitutional:      General: He is not in acute distress.    Appearance: He is ill-appearing. He is not toxic-appearing.     Comments: Chronically ill Acute respiratory distress Fatigue malaise  malnourished  HENT:     Head: Normocephalic and atraumatic.     Mouth/Throat:     Mouth: Mucous membranes are moist.  Eyes:     Extraocular Movements: Extraocular movements intact.     Pupils: Pupils are equal, round, and reactive to light.  Cardiovascular:     Rate and Rhythm: Normal rate and regular rhythm.     Pulses: Normal pulses.     Heart sounds: Normal heart sounds.  Pulmonary:     Effort: Respiratory distress present.     Breath sounds: No wheezing.  Abdominal:     General: There is no distension.     Palpations: Abdomen is soft.     Tenderness: There is no abdominal tenderness.  Musculoskeletal:        General: Normal range of motion.     Cervical back: Normal range of motion and neck supple.  Skin:    General: Skin is warm.     Capillary Refill: Capillary refill takes less than 2 seconds.  Neurological:     General: No focal deficit present.     Mental Status: He is alert.  Psychiatric:        Mood and Affect: Mood normal.      Pressure Injury 06/11/22 Sacrum Mid Stage 2 -  Partial thickness loss of dermis presenting as a shallow open injury with a red, pink wound bed without slough. (Active)  06/11/22 2300  Location: Sacrum  Location Orientation: Mid  Staging: Stage 2 -  Partial thickness loss of dermis presenting as a shallow open injury with a red, pink wound bed without slough.  Wound Description (Comments):   Present on  Admission: Yes  Dressing Type Foam - Lift dressing to assess site every shift 06/22/22 2000     Data Reviewed:   I have personally reviewed following labs and imaging studies:  Labs: Labs show the following:   Basic Metabolic Panel: Recent Labs  Lab 06/19/22 0608 06/21/22 0847 06/22/22 0537 06/23/22 0554 06/24/22 0508  NA 141 138 135 138 138  K 4.1 3.9 3.9 4.1 3.8  CL 112* 102 98 99 100  CO2 23 27 29 28 28   GLUCOSE 138* 255* 301* 232* 104*  BUN 29* 36* 51* 59* 53*  CREATININE 1.11 1.09 1.26* 1.55* 1.22  CALCIUM 8.2* 8.4* 8.3* 8.3* 8.2*  MG  --  1.7 1.9  --   --     GFR Estimated Creatinine Clearance: 36.5 mL/min (by C-G formula based on SCr of 1.22 mg/dL). Liver Function Tests: Recent Labs  Lab 06/17/22 1043 06/24/22 0508  AST 35 18  ALT 23 15  ALKPHOS 55 57  BILITOT 1.2 0.9  PROT 7.6 6.9  ALBUMIN 2.9* 2.5*     CBC: Recent Labs  Lab 06/17/22 1043 06/18/22 0500 06/19/22 0510 06/20/22 0214 06/21/22 0847 06/22/22 0537 06/23/22 0554 06/24/22 0508  WBC 9.3   < > 10.6* 14.1* 16.2* 16.8* 22.2* 22.6*  NEUTROABS 6.0  --  8.0* 10.4* 12.8* 13.4*  --   --   HGB 10.7*   < > 7.1* 10.5* 12.3* 11.7* 11.3* 11.7*  HCT 33.4*   < > 22.1* 31.6* 36.6* 35.4* 34.1* 36.2*  MCV 87.9   < > 89.5 86.6 85.5 86.1 86.5 87.4  PLT 400   < > 267 346 387 377 403* 409*   < > = values in this interval not displayed.     CBG: Recent Labs  Lab 06/23/22 0827 06/23/22 1132 06/23/22 1623 06/23/22 2111 06/24/22 0847  GLUCAP 233* 191* 383* 280* 126*     Sepsis Labs: Recent Labs  Lab 06/17/22 1043 06/17/22 1300 06/17/22 1438 06/17/22 1859 06/18/22 0500 06/21/22 0847 06/22/22 0537 06/23/22 0554 06/24/22 0508  WBC 9.3  --   --   --    < > 16.2* 16.8* 22.2* 22.6*  LATICACIDVEN 2.0* 2.4* 2.0* 1.6  --   --   --   --   --    < > = values in this interval not displayed.     Microbiology Recent Results (from the past 240 hour(s))  Resp panel by RT-PCR (RSV, Flu A&B,  Covid) Anterior Nasal Swab     Status: None   Collection Time: 06/17/22 10:40 AM   Specimen: Anterior Nasal Swab  Result Value Ref Range Status   SARS Coronavirus 2 by RT PCR NEGATIVE NEGATIVE Final    Comment: (NOTE) SARS-CoV-2 target nucleic acids are NOT DETECTED.  The SARS-CoV-2 RNA is generally detectable in upper respiratory specimens during the acute phase of infection. The lowest concentration of SARS-CoV-2 viral copies this assay can detect is 138 copies/mL. A negative  result does not preclude SARS-Cov-2 infection and should not be used as the sole basis for treatment or other patient management decisions. A negative result may occur with  improper specimen collection/handling, submission of specimen other than nasopharyngeal swab, presence of viral mutation(s) within the areas targeted by this assay, and inadequate number of viral copies(<138 copies/mL). A negative result must be combined with clinical observations, patient history, and epidemiological information. The expected result is Negative.  Fact Sheet for Patients:  EntrepreneurPulse.com.au  Fact Sheet for Healthcare Providers:  IncredibleEmployment.be  This test is no t yet approved or cleared by the [place] FDA and  has been authorized for detection and/or diagnosis of SARS-CoV-2 by FDA under an Emergency Use Authorization (EUA). This EUA will remain  in effect (meaning this test can be used) for the duration of the COVID-19 declaration under Section 564(b)(1) of the Act, 21 U.S.C.section 360bbb-3(b)(1), unless the authorization is terminated  or revoked sooner.       Influenza A by PCR NEGATIVE NEGATIVE Final   Influenza B by PCR NEGATIVE NEGATIVE Final    Comment: (NOTE) The Xpert Xpress SARS-CoV-2/FLU/RSV plus assay is intended as an aid in the diagnosis of influenza from Nasopharyngeal swab specimens and should not be used as a sole basis for treatment. Nasal  washings and aspirates are unacceptable for Xpert Xpress SARS-CoV-2/FLU/RSV testing.  Fact Sheet for Patients: EntrepreneurPulse.com.au  Fact Sheet for Healthcare Providers: IncredibleEmployment.be  This test is not yet approved or cleared by the [place] FDA and has been authorized for detection and/or diagnosis of SARS-CoV-2 by FDA under an Emergency Use Authorization (EUA). This EUA will remain in effect (meaning this test can be used) for the duration of the COVID-19 declaration under Section 564(b)(1) of the Act, 21 U.S.C. section 360bbb-3(b)(1), unless the authorization is terminated or revoked.     Resp Syncytial Virus by PCR NEGATIVE NEGATIVE Final    Comment: (NOTE) Fact Sheet for Patients: EntrepreneurPulse.com.au  Fact Sheet for Healthcare Providers: IncredibleEmployment.be  This test is not yet approved or cleared by the [place] FDA and has been authorized for detection and/or diagnosis of SARS-CoV-2 by FDA under an Emergency Use Authorization (EUA). This EUA will remain in effect (meaning this test can be used) for the duration of the COVID-19 declaration under Section 564(b)(1) of the Act, 21 U.S.C. section 360bbb-3(b)(1), unless the authorization is terminated or revoked.  Performed at [facility], [address] [place], [place] [address]   Culture, blood (routine x 2)     Status: None   Collection Time: 06/17/22 10:43 AM   Specimen: BLOOD RIGHT FOREARM  Result Value Ref Range Status   Specimen Description BLOOD RIGHT FOREARM  Final   Special Requests   Final    BOTTLES DRAWN AEROBIC AND ANAEROBIC Blood Culture adequate volume   Culture   Final    NO GROWTH 5 DAYS Performed at [facility], [address], [place], [place] [address]    Report Status 06/22/2022 FINAL  Final  Culture, blood (routine x 2)     Status: None   Collection Time: 06/17/22  10:43 AM   Specimen: BLOOD RIGHT HAND  Result Value Ref Range Status   Specimen Description BLOOD RIGHT HAND  Final   Special Requests   Final    BOTTLES DRAWN AEROBIC AND ANAEROBIC Blood Culture adequate volume   Culture   Final    NO GROWTH 5 DAYS Performed at [facility], [address],  [place], [place] [address]    Report Status 06/22/2022 FINAL  Final  MRSA Next Gen by PCR, Nasal     Status: None   Collection Time: 06/17/22  6:59 PM   Specimen: Nasal Mucosa; Nasal Swab  Result Value Ref Range Status   MRSA by PCR Next Gen NOT DETECTED NOT DETECTED Final    Comment: (NOTE) The GeneXpert MRSA Assay (FDA approved for NASAL specimens only), is one component of a comprehensive MRSA colonization surveillance program. It is not intended to diagnose MRSA infection nor to guide or monitor treatment for MRSA infections. Test performance is not FDA approved in patients less than 37 years old. Performed at [facility], [address] [place], [place] [address]   Culture, blood (Routine X 2) w Reflex to ID Panel     Status: None (Preliminary result)   Collection Time: 06/23/22  9:00 AM   Specimen: BLOOD  Result Value Ref Range Status   Specimen Description BLOOD BLOOD LEFT HAND  Final   Special Requests   Final    BOTTLES DRAWN AEROBIC AND ANAEROBIC Blood Culture adequate volume   Culture   Final    NO GROWTH < 24 HOURS Performed at [facility], [address], [place], [place] [address]    Report Status PENDING  Incomplete  Culture, blood (Routine X 2) w Reflex to ID Panel     Status: None (Preliminary result)   Collection Time: 06/23/22  9:02 AM   Specimen: BLOOD  Result Value Ref Range Status   Specimen Description BLOOD LFOA  Final   Special Requests   Final    BOTTLES DRAWN AEROBIC AND ANAEROBIC Blood Culture adequate volume   Culture   Final    NO GROWTH < 24 HOURS Performed at [facility], [address] [place], [place]  [address]    Report Status PENDING  Incomplete    Procedures and diagnostic studies:  CT Angio Chest Pulmonary Embolism (PE) W or WO Contrast  Result Date: 06/22/2022 CLINICAL DATA:  Chronic dyspnea, pulmonary embolism suspected, hypoxia EXAM: CT ANGIOGRAPHY CHEST WITH CONTRAST TECHNIQUE: Multidetector CT imaging of the chest was performed using the standard protocol during bolus administration of intravenous contrast. Multiplanar CT image reconstructions and MIPs were obtained to evaluate the vascular anatomy. RADIATION DOSE REDUCTION: This exam was performed according to the departmental dose-optimization program which includes automated exposure control, adjustment of the mA and/or kV according to patient size and/or use of iterative reconstruction technique. CONTRAST:  80mL OMNIPAQUE IOHEXOL 350 MG/ML SOLN COMPARISON:  CT done on 06/17/2022, chest radiographs done on 06/20/2022 FINDINGS: Cardiovascular: There are no intraluminal filling defects in pulmonary artery branches. Coronary artery calcifications are seen. There is previous coronary bypass surgery. Contrast density in thoracic aorta is less than optimal to evaluate the lumen. There are atherosclerotic plaques and calcifications in thoracic aorta. Mediastinum/Nodes: No interval changes are noted in slightly enlarged lymph nodes in mediastinum and hilar regions. Lungs/Pleura: Centrilobular and panlobular emphysema is seen. Blebs and bullae are seen in both apices. There are linear densities in both apices suggesting scarring. There is possible ectatic bronchus in the anterior aspect of right upper lung field with no significant change. Small patchy infiltrate is seen in right middle lobe with slight improvement. There is interval decrease in patchy infiltrates in right lower lobe. New infiltrate is seen in lingula. Large new infiltrates are noted in left lower lobe. There is interval appearance of small left pleural effusion. There is fluid  density  in the lumen of left lower lobe bronchi. There is no pneumothorax. Upper Abdomen: Left renal pelvis is prominent. There is prominence of minor calices in the upper poles of both kidneys. Kidneys are not included in their entirety limiting evaluation. There is 5 mm calcific density in the upper pole of right kidney. There is 2 mm calcific density in the upper pole of left kidney. Musculoskeletal: No acute findings are seen in bony structures. Review of the MIP images confirms the above findings. IMPRESSION: There is no evidence of pulmonary embolism. Coronary artery disease. Aortic atherosclerosis. COPD. There are patchy infiltrates in both lungs with slight improvement in the infiltrate in right lower lobe and marked interval worsening of infiltrates in left lower lobe. New small patchy infiltrate is seen in lingula. Findings suggest multifocal pneumonia with interval worsening in the left lung. There is interval appearance of small left pleural effusion. Mucous secretions are noted in the lumen of left lower lobe bronchi. Bilateral renal stones. There is prominence collecting systems in the kidneys in the visualized upper poles of both kidneys. This finding is not fully evaluated. If there is clinical suspicion for ureteric obstruction, follow-up CT abdomen and pelvis may be considered. Electronically Signed   By: Elmer Picker M.D.   On: 06/22/2022 13:02     LOS: 7 days   Shalena Ezzell [employer]  [facility]   Pager on [email or web address]. If 7PM-7AM, please contact night-coverage at [email or web address]   06/24/2022, 9:58 AM

## 2022-06-25 DIAGNOSIS — J9621 Acute and chronic respiratory failure with hypoxia: Secondary | ICD-10-CM | POA: Diagnosis not present

## 2022-06-25 DIAGNOSIS — J441 Chronic obstructive pulmonary disease with (acute) exacerbation: Secondary | ICD-10-CM | POA: Diagnosis not present

## 2022-06-25 LAB — CREATININE, SERUM
Creatinine, Ser: 1.24 mg/dL (ref 0.61–1.24)
GFR, Estimated: 57 mL/min — ABNORMAL LOW (ref >60)

## 2022-06-25 LAB — GLUCOSE, CAPILLARY
Glucose-Capillary: 103 mg/dL — ABNORMAL HIGH (ref 70–99)
Glucose-Capillary: 193 mg/dL — ABNORMAL HIGH (ref 70–99)
Glucose-Capillary: 354 mg/dL — ABNORMAL HIGH (ref 70–99)
Glucose-Capillary: 225 mg/dL — ABNORMAL HIGH (ref 70–99)

## 2022-06-25 LAB — MRSA NEXT GEN BY PCR, NASAL: MRSA by PCR Next Gen: NOT DETECTED

## 2022-06-25 MED ORDER — SENNOSIDES-DOCUSATE SODIUM 8.6-50 MG PO TABS
1.0000 | ORAL_TABLET | Freq: Two times a day (BID) | ORAL | Status: DC
  Administered 2022-06-25 – 2022-06-27 (×5): 1 via ORAL
  Filled 2022-06-25 (×5): qty 1

## 2022-06-25 MED ORDER — POLYETHYLENE GLYCOL 3350 17 G PO PACK: 17.0000 g | PACK | Freq: Every day | ORAL | Status: DC | PRN

## 2022-06-25 NOTE — Progress Notes (Addendum)
Progress Note   QUINNLAN ABRUZZO  MOQ:[id number] DOB: [birth year]  DOA: 06/17/2022 PCP: Susy Frizzle, MD   Brief Narrative:   Medical records reviewed and are as summarized below:  DENMAN PICHARDO is a 88 y.o. male with a PMH of Chronic Hypoxic Respiratory Failure on 2L [place] at night, COPD, HTN, HL, DM2, CAD s/p CABG, Chronic Diastolic Heart Failure, severe Aortic Stenosis, PAF  s/p ablation and history of DVT (on Eliquis), CKD3, BPH, and severe protein calorie malnutrition who presented to the ED with acute dyspnea and acute hypoxia due to multifocal pneumonia and acute COPD exacerbation.  Patient was treated with antibiotics and steroids.  Of note patient was admitted on 12/18 for pneumonia.  He initially improved but feels a little tired and more short of breath the last couple days.   Chest CT shows no evidence of a PE.   Assessment/Plan:   Principal Problem:   Bilateral pneumonia Active Problems:   COPD exacerbation (HCC)   Acute and chronic respiratory failure with hypoxia (HCC)   Severe sepsis (HCC)   Essential hypertension   Chronic kidney disease, stage 3a (HCC)   Type II diabetes mellitus with renal manifestations (HCC)   Back pain   BPH (benign prostatic hyperplasia)   Paroxysmal atrial fibrillation (HCC)   Cerebrovascular disease   Cigarette smoker   Depression with anxiety   Protein-calorie malnutrition, severe   Body mass index is 16.5 kg/m.  (Underweight)  Acute on Chronic Hypoxic Respiratory Failure: - Oxygen is currently at 2L [place].  His baseline ~ 2L .    Hypotension, resolved  Multifocal Pneumonia: CT shows bilateral opacities.  Patient has worsening leukocytosis despite being on cefepime for 7 days, and Flagyl.  Added vancomycin 12/31, will plan to continue Cefepime another 3 days for total 10 day course, today is day 8/10 of cefepime.  Follow blood cultures continue to trend white count - Given patient's clinical change today and recent diagnosis  of pneumonia on 12/18. -Patient was placed on Ventimask secondary to not keeping his nasal cannula on.   AKI : Patient renal function has gradually worsened from 1.09 -1.26 1.55 and improving now,   Lasix being held, on gentle IVF will continue this as renal function is improving now.  Acute COPD Exacerbation / Chronic Emphysema : Patient has poor residual lung function and continues to have bibasilar wheezing and is short of breath.  He will need a prolonged course of steroids. - This is Day 10 out of 10 of steroids, currently no wheezing. - Continue nebs and Pulmicort.  Chronic Diastolic Heart Failure / Small Bilateral Pleural Effusions:  - Now with worsening renal function. Hold Lasix.   Coronary Artery Disease s/p CABG / Atherosclerosis / History of Stroke: - Aspirin and Statin.  PAF: - Patient follows with Cardiologist Dr. Stanford Breed.  Bilateral Renal Stones: - Abdominal exam is stable.  GERD: - PPI  DM2: Glucose is a little elevated on steroids. - Increase NPH from 10 to 12 units am and 14 to 18 units pm. - Continue Ssi with POC glucose ACHS.  Lower Back Pain: - Lumbar CT showed no acute abnormality.  BPH: Flomax  Prior Nasal Fracture: Follow up with ENT outpatient. Hearing Loss  Chronic Tobacco Use: - Counseled on cessation.  RUL Lung Nodule: - Monitor outpatient  Severe Protein Calorie Malnutrition: - Ensure TID.  Code Status: DNR/DNI   Diet Order             Diet  regular Room service appropriate? Yes; Fluid consistency: Thin  Diet effective now                   Consultants: None  Procedures: None    Medications:    aspirin  325 mg Oral Daily   budesonide (PULMICORT) nebulizer solution  0.5 mg Nebulization BID   Chlorhexidine Gluconate Cloth  6 each Topical Daily   enoxaparin (LOVENOX) injection  40 mg Subcutaneous Q24H   feeding supplement  237 mL Oral TID BM   insulin aspart  0-15 Units Subcutaneous TID WC   insulin aspart  0-5 Units  Subcutaneous QHS   insulin aspart protamine- aspart  12 Units Subcutaneous Q breakfast   insulin aspart protamine- aspart  18 Units Subcutaneous Q supper   ipratropium-albuterol  3 mL Nebulization TID   multivitamin with minerals  1 tablet Oral Daily   nicotine  21 mg Transdermal Daily   mouth rinse  15 mL Mouth Rinse 4 times per day   pantoprazole  40 mg Oral Daily   pravastatin  40 mg Oral QHS   senna-docusate  1 tablet Oral BID   tamsulosin  0.4 mg Oral Daily   Continuous Infusions:  ceFEPime (MAXIPIME) IV 2 g (06/25/22 1052)   vancomycin 750 mg (06/24/22 1449)    Anti-infectives (From admission, onward)    Start     Dose/Rate Route Frequency Ordered Stop   06/24/22 1300  vancomycin (VANCOREADY) IVPB 750 mg/150 mL        750 mg 150 mL/hr over 60 Minutes Intravenous Every 24 hours 06/24/22 1221     06/24/22 1000  ceFEPIme (MAXIPIME) 2 g in sodium chloride 0.9 % 100 mL IVPB        2 g 200 mL/hr over 30 Minutes Intravenous Every 12 hours 06/24/22 0954 06/27/22 0959   06/23/22 2200  ceFEPIme (MAXIPIME) 2 g in sodium chloride 0.9 % 100 mL IVPB        2 g 200 mL/hr over 30 Minutes Intravenous Every 24 hours 06/23/22 0845 06/23/22 2215   06/23/22 1000  vancomycin (VANCOREADY) IVPB 1250 mg/250 mL        1,250 mg 166.7 mL/hr over 90 Minutes Intravenous  Once 06/23/22 0844 06/23/22 1134   06/23/22 0930  vancomycin (VANCOCIN) IVPB 1000 mg/200 mL premix  Status:  Discontinued        1,000 mg 200 mL/hr over 60 Minutes Intravenous Every 12 hours 06/23/22 0830 06/23/22 0841   06/23/22 0843  vancomycin variable dose per unstable renal function (pharmacist dosing)  Status:  Discontinued         Does not apply See admin instructions 06/23/22 0844 06/25/22 0951   06/22/22 1800  doxycycline (VIBRA-TABS) tablet 100 mg  Status:  Discontinued        100 mg Oral Every 12 hours 06/22/22 1751 06/23/22 0829   06/18/22 1200  vancomycin (VANCOCIN) 750 mg in sodium chloride 0.9 % 250 mL IVPB  Status:   Discontinued        750 mg 250 mL/hr over 60 Minutes Intravenous Every 24 hours 06/17/22 1628 06/18/22 0533   06/18/22 1200  vancomycin (VANCOREADY) IVPB 750 mg/150 mL  Status:  Discontinued        750 mg 150 mL/hr over 60 Minutes Intravenous Every 24 hours 06/18/22 0533 06/18/22 1125   06/17/22 2200  ceFEPIme (MAXIPIME) 2 g in sodium chloride 0.9 % 100 mL IVPB  Status:  Discontinued  2 g 200 mL/hr over 30 Minutes Intravenous Every 12 hours 06/17/22 1628 06/23/22 0845   06/17/22 1600  metroNIDAZOLE (FLAGYL) IVPB 500 mg        500 mg 100 mL/hr over 60 Minutes Intravenous Every 12 hours 06/17/22 1553 06/23/22 1615   06/17/22 1200  vancomycin (VANCOCIN) IVPB 1000 mg/200 mL premix        1,000 mg 200 mL/hr over 60 Minutes Intravenous  Once 06/17/22 1154 06/17/22 1448   06/17/22 1200  ceFEPIme (MAXIPIME) 2 g in sodium chloride 0.9 % 100 mL IVPB        2 g 200 mL/hr over 30 Minutes Intravenous  Once 06/17/22 1154 06/17/22 1330        Family Communication/Anticipated D/C date and plan/Code Status   DVT prophylaxis: enoxaparin (LOVENOX) injection 40 mg Start: 06/24/22 2200     Code Status: DNR  Family Communication: [name], grand daughter over the phone this PM.  Disposition Plan: PT/OT recommends Home with Home Health but has not seen in a few days. Will ask for re-eval. Family plans to take home to stay with them.  Status is: Inpatient Remains inpatient appropriate because: pneumonia and acute hypoxia  Subjective:   Mr. Kittel symptoms are stable today, remains with shortness of breath. He also appears fatigued and malaise but seems to be improving slowly.  Objective:    Vitals:   06/25/22 0717 06/25/22 0758 06/25/22 1045 06/25/22 1154  BP:  (!) 140/78 131/73 (!) 133/57  Pulse:  84 90 79  Resp:  20  19  Temp:  98.2 F (36.8 C)  98.1 F (36.7 C)  TempSrc:    Oral  SpO2: 95% 95% 95% 95%  Weight:      Height:       No data found.   Intake/Output Summary  (Last 24 hours) at 06/25/2022 1241 Last data filed at 06/25/2022 1000 Gross per 24 hour  Intake 240 ml  Output 2050 ml  Net -1810 ml    Filed Weights   06/17/22 0958  Weight: 58.3 kg    Exam:  Physical Exam Vitals reviewed.  Constitutional:      General: He is not in acute distress.    Appearance: He is ill-appearing. He is not toxic-appearing.     Comments: Chronically ill Acute respiratory distress Fatigue malaise  malnourished  HENT:     Head: Normocephalic and atraumatic.     Mouth/Throat:     Mouth: Mucous membranes are moist.  Eyes:     Extraocular Movements: Extraocular movements intact.     Pupils: Pupils are equal, round, and reactive to light.  Cardiovascular:     Rate and Rhythm: Normal rate and regular rhythm.     Pulses: Normal pulses.     Heart sounds: Normal heart sounds.  Pulmonary:     Effort: Respiratory distress present.     Breath sounds: No wheezing.  Abdominal:     General: There is no distension.     Palpations: Abdomen is soft.     Tenderness: There is no abdominal tenderness.  Musculoskeletal:        General: Normal range of motion.     Cervical back: Normal range of motion and neck supple.  Skin:    General: Skin is warm.     Capillary Refill: Capillary refill takes less than 2 seconds.  Neurological:     General: No focal deficit present.     Mental Status: He is alert.  Psychiatric:  Mood and Affect: Mood normal.      Pressure Injury 06/11/22 Sacrum Mid Stage 2 -  Partial thickness loss of dermis presenting as a shallow open injury with a red, pink wound bed without slough. (Active)  06/11/22 2300  Location: Sacrum  Location Orientation: Mid  Staging: Stage 2 -  Partial thickness loss of dermis presenting as a shallow open injury with a red, pink wound bed without slough.  Wound Description (Comments):   Present on Admission: Yes  Dressing Type Foam - Lift dressing to assess site every shift 06/22/22 2000     Data  Reviewed:   I have personally reviewed following labs and imaging studies:  Labs: Labs show the following:   Basic Metabolic Panel: Recent Labs  Lab 06/19/22 0608 06/21/22 0847 06/22/22 0537 06/23/22 0554 06/24/22 0508 06/25/22 0603  NA 141 138 135 138 138  --   K 4.1 3.9 3.9 4.1 3.8  --   CL 112* 102 98 99 100  --   CO2 23 27 29 28 28   --   GLUCOSE 138* 255* 301* 232* 104*  --   BUN 29* 36* 51* 59* 53*  --   CREATININE 1.11 1.09 1.26* 1.55* 1.22 1.24  CALCIUM 8.2* 8.4* 8.3* 8.3* 8.2*  --   MG  --  1.7 1.9  --   --   --     GFR Estimated Creatinine Clearance: 35.9 mL/min (by C-G formula based on SCr of 1.24 mg/dL). Liver Function Tests: Recent Labs  Lab 06/24/22 0508  AST 18  ALT 15  ALKPHOS 57  BILITOT 0.9  PROT 6.9  ALBUMIN 2.5*     CBC: Recent Labs  Lab 06/19/22 0510 06/20/22 0214 06/21/22 0847 06/22/22 0537 06/23/22 0554 06/24/22 0508  WBC 10.6* 14.1* 16.2* 16.8* 22.2* 22.6*  NEUTROABS 8.0* 10.4* 12.8* 13.4*  --   --   HGB 7.1* 10.5* 12.3* 11.7* 11.3* 11.7*  HCT 22.1* 31.6* 36.6* 35.4* 34.1* 36.2*  MCV 89.5 86.6 85.5 86.1 86.5 87.4  PLT 267 346 387 377 403* 409*     CBG: Recent Labs  Lab 06/24/22 1240 06/24/22 1721 06/24/22 2115 06/25/22 0758 06/25/22 1154  GLUCAP 116* 202* 84 103* 193*     Sepsis Labs: Recent Labs  Lab 06/21/22 0847 06/22/22 0537 06/23/22 0554 06/24/22 0508  WBC 16.2* 16.8* 22.2* 22.6*     Microbiology Recent Results (from the past 240 hour(s))  Resp panel by RT-PCR (RSV, Flu A&B, Covid) Anterior Nasal Swab     Status: None   Collection Time: 06/17/22 10:40 AM   Specimen: Anterior Nasal Swab  Result Value Ref Range Status   SARS Coronavirus 2 by RT PCR NEGATIVE NEGATIVE Final    Comment: (NOTE) SARS-CoV-2 target nucleic acids are NOT DETECTED.  The SARS-CoV-2 RNA is generally detectable in upper respiratory specimens during the acute phase of infection. The lowest concentration of SARS-CoV-2 viral  copies this assay can detect is 138 copies/mL. A negative result does not preclude SARS-Cov-2 infection and should not be used as the sole basis for treatment or other patient management decisions. A negative result may occur with  improper specimen collection/handling, submission of specimen other than nasopharyngeal swab, presence of viral mutation(s) within the areas targeted by this assay, and inadequate number of viral copies(<138 copies/mL). A negative result must be combined with clinical observations, patient history, and epidemiological information. The expected result is Negative.  Fact Sheet for Patients:  EntrepreneurPulse.com.au  Fact Sheet for Healthcare Providers:  IncredibleEmployment.be  This test is no t yet approved or cleared by the [place] and  has been authorized for detection and/or diagnosis of SARS-CoV-2 by FDA under an Emergency Use Authorization (EUA). This EUA will remain  in effect (meaning this test can be used) for the duration of the COVID-19 declaration under Section 564(b)(1) of the Act, 21 U.S.C.section 360bbb-3(b)(1), unless the authorization is terminated  or revoked sooner.       Influenza A by PCR NEGATIVE NEGATIVE Final   Influenza B by PCR NEGATIVE NEGATIVE Final    Comment: (NOTE) The Xpert Xpress SARS-CoV-2/FLU/RSV plus assay is intended as an aid in the diagnosis of influenza from Nasopharyngeal swab specimens and should not be used as a sole basis for treatment. Nasal washings and aspirates are unacceptable for Xpert Xpress SARS-CoV-2/FLU/RSV testing.  Fact Sheet for Patients: EntrepreneurPulse.com.au  Fact Sheet for Healthcare Providers: IncredibleEmployment.be  This test is not yet approved or cleared by the [place] FDA and has been authorized for detection and/or diagnosis of SARS-CoV-2 by FDA under an Emergency Use Authorization (EUA). This  EUA will remain in effect (meaning this test can be used) for the duration of the COVID-19 declaration under Section 564(b)(1) of the Act, 21 U.S.C. section 360bbb-3(b)(1), unless the authorization is terminated or revoked.     Resp Syncytial Virus by PCR NEGATIVE NEGATIVE Final    Comment: (NOTE) Fact Sheet for Patients: EntrepreneurPulse.com.au  Fact Sheet for Healthcare Providers: IncredibleEmployment.be  This test is not yet approved or cleared by the [place] FDA and has been authorized for detection and/or diagnosis of SARS-CoV-2 by FDA under an Emergency Use Authorization (EUA). This EUA will remain in effect (meaning this test can be used) for the duration of the COVID-19 declaration under Section 564(b)(1) of the Act, 21 U.S.C. section 360bbb-3(b)(1), unless the authorization is terminated or revoked.  Performed at [facility], [address] [place], [place] [address]   Culture, blood (routine x 2)     Status: None   Collection Time: 06/17/22 10:43 AM   Specimen: BLOOD RIGHT FOREARM  Result Value Ref Range Status   Specimen Description BLOOD RIGHT FOREARM  Final   Special Requests   Final    BOTTLES DRAWN AEROBIC AND ANAEROBIC Blood Culture adequate volume   Culture   Final    NO GROWTH 5 DAYS Performed at [facility], [address], [place], [place] [address]    Report Status 06/22/2022 FINAL  Final  Culture, blood (routine x 2)     Status: None   Collection Time: 06/17/22 10:43 AM   Specimen: BLOOD RIGHT HAND  Result Value Ref Range Status   Specimen Description BLOOD RIGHT HAND  Final   Special Requests   Final    BOTTLES DRAWN AEROBIC AND ANAEROBIC Blood Culture adequate volume   Culture   Final    NO GROWTH 5 DAYS Performed at [facility], [address] [place], [place] [address]    Report Status 06/22/2022 FINAL  Final  MRSA Next Gen by PCR, Nasal     Status: None    Collection Time: 06/17/22  6:59 PM   Specimen: Nasal Mucosa; Nasal Swab  Result Value Ref Range Status   MRSA by PCR Next Gen NOT DETECTED NOT DETECTED Final    Comment: (NOTE) The GeneXpert MRSA Assay (FDA approved for NASAL specimens only), is one component of a comprehensive MRSA colonization surveillance program. It is not intended to diagnose MRSA  infection nor to guide or monitor treatment for MRSA infections. Test performance is not FDA approved in patients less than 23 years old. Performed at [facility], [address] [place], [place] [address]   Culture, blood (Routine X 2) w Reflex to ID Panel     Status: None (Preliminary result)   Collection Time: 06/23/22  9:00 AM   Specimen: BLOOD  Result Value Ref Range Status   Specimen Description BLOOD BLOOD LEFT HAND  Final   Special Requests   Final    BOTTLES DRAWN AEROBIC AND ANAEROBIC Blood Culture adequate volume   Culture   Final    NO GROWTH 2 DAYS Performed at [facility], [address], [place], [place] [address]    Report Status PENDING  Incomplete  Culture, blood (Routine X 2) w Reflex to ID Panel     Status: None (Preliminary result)   Collection Time: 06/23/22  9:02 AM   Specimen: BLOOD  Result Value Ref Range Status   Specimen Description BLOOD LFOA  Final   Special Requests   Final    BOTTLES DRAWN AEROBIC AND ANAEROBIC Blood Culture adequate volume   Culture   Final    NO GROWTH 2 DAYS Performed at [facility], [address], [place], [place] [address]    Report Status PENDING  Incomplete    Procedures and diagnostic studies:  No results found.   LOS: 8 days   Azahel Belcastro [employer]  [facility]   Pager on [email or web address]. If 7PM-7AM, please contact night-coverage at [email or web address]   06/25/2022, 12:41 PM

## 2022-06-25 NOTE — Progress Notes (Signed)
Pt was restless most of the night. He was not able to keep his venturi mask on, noticeable increased work of breathing. RR ranges from 25-30. O2 sat was 92% on the mask, 89% on room air. Diminished bilateral breath sounds with occasional crackles. 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 Pt had an PNSS running at 95mL/hr. Informed NP Randol Kern made aware. IV fluid stopped as per order. Put pt on 2L nasal cannula. Pt appears comfortable in bed now, easily arousable. Still with non productive cough. Oral care rendered. Pt able to take sips of water. Strict aspiration precaution observed.

## 2022-06-25 NOTE — Plan of Care (Signed)
  Problem: Education: Goal: Knowledge of disease or condition will improve Outcome: Progressing Goal: Knowledge of the prescribed therapeutic regimen will improve Outcome: Progressing Goal: Individualized Educational Video(s) Outcome: Progressing   Problem: Activity: Goal: Ability to tolerate increased activity will improve Outcome: Progressing Goal: Will verbalize the importance of balancing activity with adequate rest periods Outcome: Progressing   Problem: Respiratory: Goal: Ability to maintain a clear airway will improve Outcome: Progressing Goal: Levels of oxygenation will improve Outcome: Progressing Goal: Ability to maintain adequate ventilation will improve Outcome: Progressing   Problem: Activity: Goal: Ability to tolerate increased activity will improve Outcome: Progressing   Problem: Clinical Measurements: Goal: Ability to maintain a body temperature in the normal range will improve Outcome: Progressing   Problem: Respiratory: Goal: Ability to maintain adequate ventilation will improve Outcome: Progressing Goal: Ability to maintain a clear airway will improve Outcome: Progressing   Problem: Education: Goal: Ability to describe self-care measures that may prevent or decrease complications (Diabetes Survival Skills Education) will improve Outcome: Progressing Goal: Individualized Educational Video(s) Outcome: Progressing   Problem: Coping: Goal: Ability to adjust to condition or change in health will improve Outcome: Progressing   Problem: Fluid Volume: Goal: Ability to maintain a balanced intake and output will improve Outcome: Progressing   Problem: Health Behavior/Discharge Planning: Goal: Ability to identify and utilize available resources and services will improve Outcome: Progressing Goal: Ability to manage health-related needs will improve Outcome: Progressing   Problem: Metabolic: Goal: Ability to maintain appropriate glucose levels will  improve Outcome: Progressing   Problem: Nutritional: Goal: Maintenance of adequate nutrition will improve Outcome: Progressing Goal: Progress toward achieving an optimal weight will improve Outcome: Progressing   Problem: Skin Integrity: Goal: Risk for impaired skin integrity will decrease Outcome: Progressing   Problem: Tissue Perfusion: Goal: Adequacy of tissue perfusion will improve Outcome: Progressing   Problem: Education: Goal: Knowledge of General Education information will improve Description: Including pain rating scale, medication(s)/side effects and non-pharmacologic comfort measures Outcome: Progressing   Problem: Health Behavior/Discharge Planning: Goal: Ability to manage health-related needs will improve Outcome: Progressing   Problem: Clinical Measurements: Goal: Ability to maintain clinical measurements within normal limits will improve Outcome: Progressing Goal: Will remain free from infection Outcome: Progressing Goal: Diagnostic test results will improve Outcome: Progressing Goal: Respiratory complications will improve Outcome: Progressing Goal: Cardiovascular complication will be avoided Outcome: Progressing   Problem: Activity: Goal: Risk for activity intolerance will decrease Outcome: Progressing   Problem: Nutrition: Goal: Adequate nutrition will be maintained Outcome: Progressing   Problem: Coping: Goal: Level of anxiety will decrease Outcome: Progressing   Problem: Elimination: Goal: Will not experience complications related to bowel motility Outcome: Progressing Goal: Will not experience complications related to urinary retention Outcome: Progressing   Problem: Pain Managment: Goal: General experience of comfort will improve Outcome: Progressing   Problem: Safety: Goal: Ability to remain free from injury will improve Outcome: Progressing   Problem: Skin Integrity: Goal: Risk for impaired skin integrity will decrease Outcome:  Progressing

## 2022-06-26 DIAGNOSIS — J9621 Acute and chronic respiratory failure with hypoxia: Secondary | ICD-10-CM | POA: Diagnosis not present

## 2022-06-26 DIAGNOSIS — J441 Chronic obstructive pulmonary disease with (acute) exacerbation: Secondary | ICD-10-CM | POA: Diagnosis not present

## 2022-06-26 LAB — GLUCOSE, CAPILLARY
Glucose-Capillary: 102 mg/dL — ABNORMAL HIGH (ref 70–99)
Glucose-Capillary: 173 mg/dL — ABNORMAL HIGH (ref 70–99)
Glucose-Capillary: 123 mg/dL — ABNORMAL HIGH (ref 70–99)
Glucose-Capillary: 109 mg/dL — ABNORMAL HIGH (ref 70–99)

## 2022-06-26 MED ORDER — NYSTATIN 100000 UNIT/ML MT SUSP
5.0000 mL | Freq: Four times a day (QID) | OROMUCOSAL | Status: DC
  Administered 2022-06-26 – 2022-06-28 (×6): 500000 [IU] via ORAL
  Filled 2022-06-26 (×5): qty 5

## 2022-06-26 NOTE — Evaluation (Addendum)
Physical Therapy Re-Evaluation Patient Details Name: [name] MRN: [id number] DOB: [birth year] Today's Date: 06/26/2022  History of Present Illness  [name] is a 88 y.o. male with medical history significant of hypertension, hyperlipidemia, diabetes mellitus, COPD, severe AS, depression with anxiety, tobacco abuse, A flutter/A-fib, CKD-3A, DVT, aspiration pneumonia, BPH, who presents with shortness breath. New orders placed 06/25/2022 for re-evaluation as previous orders discontinued without documentation as to why.   Clinical Impression  Pt admitted with above diagnosis. Pt received semi reclined in bed very lethargic, grand daughter at bedside. Pt known to author as previous eval performed when pt was in the ED. Pt very [facility] so pt able to report DME at their disposal. Confirms PLOF before hospital admissions with independent fully. Pt was living with family between admissions. Pt's grand daughter has concerns about pt's mobility reporting pt becoming more weak and lethargic.   To date, pt awakens to voice and TC's, initially resistant to mobility holding his head declining attempts. Pt educated on need for OOB mobility and with grand daughter encouragement willing to participate. Pt resting on 2L/min via [place] throughout session maintaining SPO2 > 90% throughout. Pt requiring minA at LE's to mobilize to EOB due to pt being [facility]. Pt overall transferring to standing to RW supervision and ambulating 25' to recliner with heavy UE support but overall safe use of RW but appears more laborious compared to previous session. Pt situated sitting in recliner with all needs in reach. Discussion with grand daughter on need for 24/7 supervision if plan to d/c home with [facility] PT services. Educated pt's grand daughter he may benefit from STR due to significant deconditioning and if pt's family has concerns with pt condition and/or providing the needed assistance.   Addended 12:37 p.m. after secure chat discussion  with RN and attending MD. Per RN pt unable to tolerate sitting in recliner almost sliding out of recliner due to weakness along with pt's family voicing concerns caring for pt at home. Changing recommendations due to updates post PT re-eval.    Recommendations for follow up therapy are one component of a multi-disciplinary discharge planning process, led by the attending physician.  Recommendations may be updated based on patient status, additional functional criteria and insurance authorization.  Follow Up Recommendations Skilled nursing-short term rehab (<3 hours/day) Can patient physically be transported by private vehicle: Yes    Assistance Recommended at Discharge Frequent or constant Supervision/Assistance  Patient can return home with the following  A little help with walking and/or transfers;Assistance with cooking/housework;Assist for transportation;A little help with bathing/dressing/bathroom;Help with stairs or ramp for entrance    Equipment Recommendations None recommended by PT  Recommendations for Other Services       Functional Status Assessment Patient has had a recent decline in their functional status and demonstrates the ability to make significant improvements in function in a reasonable and predictable amount of time.     Precautions / Restrictions Precautions Precautions: Fall Precaution Comments: Very HOH Restrictions Weight Bearing Restrictions: No      Mobility  Bed Mobility Overal bed mobility: Needs Assistance Bed Mobility: Supine to Sit     Supine to sit: Min assist, HOB elevated     General bed mobility comments: minA at LE's to initiate movement. Believe pt could perform but needing cuing and assist on LE's due to [facility] to understand what task to perform. Patient Response: Cooperative  Transfers Overall transfer level: Needs assistance Equipment used: Rolling walker (2 wheels) Transfers: Sit to/from  Stand Sit to Stand: Supervision            General transfer comment: stands with supervision, needs VC's for hand placement.    Ambulation/Gait Ambulation/Gait assistance: Supervision Gait Distance (Feet): 25 Feet Assistive device: Rolling walker (2 wheels) Gait Pattern/deviations: Step-through pattern, Decreased stride length, Narrow base of support       General Gait Details: hunched over posture on RW, increased use of UE's on RW to support himself. Unable to tolerate further gait due to lethargy.  Stairs            Wheelchair Mobility    Modified Rankin (Stroke Patients Only)       Balance Overall balance assessment: Needs assistance Sitting-balance support: Bilateral upper extremity supported, Feet supported Sitting balance-Leahy Scale: Fair       Standing balance-Leahy Scale: Fair Standing balance comment: stands at [facility] with UE support                             Pertinent Vitals/Pain Pain Assessment Pain Assessment: No/denies pain    Home Living Family/patient expects to be discharged to:: Private residence Living Arrangements: Children;Other (Comment) (son's house) Available Help at Discharge: Family;Available 24 hours/day Type of Home: House Home Access: Stairs to enter   [employer] of Steps: 6 (unsure of side of rails)   Home Layout: One level Home Equipment: Advice worker (2 wheels);Hospital bed Additional Comments: grand daughter in room reporting DME mentioned above.    Prior Function Prior Level of Function : Independent/Modified Independent             Mobility Comments: Reports since last admission, was ambulating without AD per his PLOF. ADLs Comments: IND, family reports pt mows his own yards and completes IADLs. Family assists with heavy household chores if needed     Hand Dominance   Dominant Hand: Right    Extremity/Trunk Assessment   Upper Extremity Assessment Upper Extremity Assessment: Generalized weakness    Lower  Extremity Assessment Lower Extremity Assessment: Generalized weakness    Cervical / Trunk Assessment Cervical / Trunk Assessment: Kyphotic  Communication   Communication: HOH  Cognition Arousal/Alertness: Lethargic Behavior During Therapy: WFL for tasks assessed/performed Overall Cognitive Status: Difficult to assess                                 General Comments: Very HOH, benefits from tactile/visual cues. Appears a lot more lethargic compared to previous PT session.        General Comments General comments (skin integrity, edema, etc.): SPo2 > 90% at rest and with mobility on 2 L/min    Exercises Other Exercises Other Exercises: benefits of OOB mobility to prevent further deconditioning.   Assessment/Plan    PT Assessment Patient needs continued PT services  PT Problem List Decreased strength;Decreased activity tolerance;Decreased balance;Decreased mobility       PT Treatment Interventions DME instruction;Gait training;Stair training;Functional mobility training;Patient/family education;Therapeutic activities;Therapeutic exercise;Balance training    PT Goals (Current goals can be found in the Care Plan section)  Acute Rehab PT Goals Patient Stated Goal: improve breathing, go home PT Goal Formulation: With patient Time For Goal Achievement: 07/10/22 Potential to Achieve Goals: Fair    Frequency Min 2X/week     Co-evaluation               AM-PAC PT "6 Clicks" Mobility  Outcome Measure  Help needed turning from your back to your side while in a flat bed without using bedrails?: A Little Help needed moving from lying on your back to sitting on the side of a flat bed without using bedrails?: A Little Help needed moving to and from a bed to a chair (including a wheelchair)?: A Little Help needed standing up from a chair using your arms (e.g., wheelchair or bedside chair)?: A Little Help needed to walk in hospital room?: A Lot Help needed climbing  3-5 steps with a railing? : A Lot 6 Click Score: 16    End of Session Equipment Utilized During Treatment: Gait belt;Oxygen Activity Tolerance: Patient limited by lethargy Patient left: in chair;with call bell/phone within reach;with chair alarm set;with family/visitor present Nurse Communication: Mobility status PT Visit Diagnosis: Other abnormalities of gait and mobility (R26.89);Muscle weakness (generalized) (M62.81)    Time: 1030-1050 PT Time Calculation (min) (ACUTE ONLY): 20 min   Charges:   PT Evaluation $PT Eval Moderate Complexity: Wright M. Fairly IV, PT, DPT Physical Therapist- [facility]  06/26/2022, 12:41 PM

## 2022-06-26 NOTE — TOC Progression Note (Signed)
Transition of Care [facility]) - Progression Note    Patient Details  Name: [name] MRN: [id number] Date of Birth: May 05, 1935  Transition of Care [facility]) CM/SW [place], [place] Phone Number: 06/26/2022, 1:19 PM  Clinical Narrative:      Recommendations have changed from [facility] to SNF as patient is deconditioned and weak per PT assessment. Treatment team in agreement with recommendations for SNF at this time.   CSW spoke with patient's granddaughter who is in agreement with this plan, insurance authorization process reviewed with her. Agreeable for referrals to be sent out for bed offers.   Pending bed offers at this time.        Expected Discharge Plan and Services                                               Social Determinants of Health (SDOH) Interventions SDOH Screenings   Food Insecurity: No Food Insecurity (06/19/2022)  Housing: Low Risk  (06/19/2022)  Transportation Needs: No Transportation Needs (06/19/2022)  Utilities: Not At Risk (06/19/2022)  Alcohol Screen: Low Risk  (08/31/2021)  Depression (PHQ2-9): Low Risk  (02/21/2022)  Financial Resource Strain: Low Risk  (08/31/2021)  Physical Activity: Insufficiently Active (08/31/2021)  Social Connections: Moderately Isolated (08/31/2021)  Stress: No Stress Concern Present (08/31/2021)  Tobacco Use: High Risk (06/19/2022)    Readmission Risk Interventions    10/08/2021   12:04 PM  Readmission Risk Prevention Plan  Post Dischage Appt Complete  Medication Screening Complete  Transportation Screening Complete

## 2022-06-26 NOTE — NC FL2 (Addendum)
[place] MEDICAID FL2 LEVEL OF CARE FORM     IDENTIFICATION  Patient Name: [name] Birthdate: 01-Dec-1934 Sex: male Admission Date (Current Location): 06/17/2022  [place] and [place] Number:  Chiropodist and Address:  [facility], [address], [place], [place] [address]      Provider Number: [id number]  Attending Physician Name and Address:  Kirby Crigler, Mir Mohammed*  Relative Name and Phone Number:  Marchelle Folks (granddaughter) [phone]    Current Level of Care: Hospital Recommended Level of Care: Skilled Nursing Facility Prior Approval Number:    Date Approved/Denied:   PASRR Number:  [id number]  Discharge Plan: SNF    Current Diagnoses: Patient Active Problem List   Diagnosis Date Noted   Bilateral pneumonia 06/17/2022   Severe sepsis (HCC) 06/17/2022   Chronic kidney disease, stage 3a (HCC) 06/17/2022   Type II diabetes mellitus with renal manifestations (HCC) 06/17/2022   HTN (hypertension) 06/17/2022   HLD (hyperlipidemia) 06/17/2022   COPD exacerbation (HCC) 06/17/2022   Depression with anxiety 06/17/2022   Paroxysmal atrial fibrillation (HCC) 06/17/2022   SIRS (systemic inflammatory response syndrome) (HCC) 06/10/2022   Lactic acidosis 06/10/2022   Transient hypotension 06/10/2022   Acute kidney injury superimposed on chronic kidney disease (HCC) 06/10/2022   Nasal fracture 06/10/2022   Fall at home, initial encounter 06/10/2022   Back pain 06/10/2022   BPH (benign prostatic hyperplasia) 06/10/2022   Protein-calorie malnutrition, severe 10/05/2021   Acute and chronic respiratory failure with hypoxia (HCC) 10/03/2021   CAP (community acquired pneumonia) 10/03/2021   Pulmonary nodule 10/03/2021   CAD (coronary artery disease) 10/03/2021   Aspiration pneumonia (HCC) 10/03/2021   Chronic kidney disease (CKD), stage III (moderate) (HCC) 10/03/2021   Uncontrolled type 2 diabetes mellitus with hyperglycemia, with  long-term current use of insulin (HCC) 10/03/2021   Nocturnal hypoxemia 09/05/2020   COPD GOLD 0 still smoking  06/07/2018   Pseudomonas pneumonia (HCC) 02/02/2018   Adult BMI <19 kg/sq m 02/02/2018   Pulmonary infiltrates on CXR 01/08/2018   Depression 11/25/2017   DKA (diabetic ketoacidosis) (HCC) 07/29/2017   Lung nodule 01/13/2015   Food impaction of esophagus 12/06/2013   Dysphagia 12/06/2013   Cerebrovascular disease 11/14/2011   Fatigue 01/08/2011   Bruit 01/08/2011   Cigarette smoker 01/11/2010   ABDOMINAL BRUIT 01/11/2010   DYSLIPIDEMIA 11/19/2008   Essential hypertension 11/19/2008   Atrial flutter (HCC) 11/19/2008   DVT 11/19/2008    Orientation RESPIRATION BLADDER Height & Weight     Self, Time, Situation, Place  O2 (2L nasal cannula) Continent, External catheter Weight: 128 lb 8.5 oz (58.3 kg) Height:  6\' 2"  (188 cm)  BEHAVIORAL SYMPTOMS/MOOD NEUROLOGICAL BOWEL NUTRITION STATUS      Incontinent Diet (see dc summary)  AMBULATORY STATUS COMMUNICATION OF NEEDS Skin   Extensive Assist Verbally Other (Comment) (abrasion arms legs and buttocks, pressure injury sacrum stage 2, lacertion left face and right jaw)                       Personal Care Assistance Level of Assistance  Bathing, Feeding, Total care, Dressing Bathing Assistance: Maximum assistance Feeding assistance: Independent Dressing Assistance: Maximum assistance Total Care Assistance: Maximum assistance   Functional Limitations Info  Sight, Hearing, Speech Sight Info: Adequate Hearing Info: Impaired Speech Info: Adequate    SPECIAL CARE FACTORS FREQUENCY  OT (By licensed OT), PT (By licensed PT)     PT Frequency: min 4x weekly OT Frequency: min 4x  weekly            Contractures Contractures Info: Not present    Additional Factors Info  Code Status, Allergies Code Status Info: DNR Allergies Info: celebrex (celecoxib), tape           Current Medications (06/26/2022):  This is the  current hospital active medication list Current Facility-Administered Medications  Medication Dose Route Frequency Provider Last Rate Last Admin   acetaminophen (TYLENOL) tablet 650 mg  650 mg Oral Q6H PRN Lorretta Harp, MD       albuterol (PROVENTIL) (2.5 MG/3ML) 0.083% nebulizer solution 2.5 mg  2.5 mg Nebulization Q4H PRN Lorretta Harp, MD   2.5 mg at 06/26/22 4010   aspirin tablet 325 mg  325 mg Oral Daily Lorretta Harp, MD   325 mg at 06/26/22 0938   budesonide (PULMICORT) nebulizer solution 0.5 mg  0.5 mg Nebulization BID Baldwin Jamaica, MD   0.5 mg at 06/26/22 0755   ceFEPIme (MAXIPIME) 2 g in sodium chloride 0.9 % 100 mL IVPB  2 g Intravenous Q12H Colon Branch, MD   Stopped at 06/26/22 1005   Chlorhexidine Gluconate Cloth 2 % PADS 6 each  6 each Topical Daily Kirstie Peri, MD   6 each at 06/26/22 0948   enoxaparin (LOVENOX) injection 40 mg  40 mg Subcutaneous Q24H Sharen Hones, RPH   40 mg at 06/25/22 2234   feeding supplement (ENSURE ENLIVE / ENSURE PLUS) liquid 237 mL  237 mL Oral TID BM Lurene Shadow, MD   237 mL at 06/26/22 0928   guaiFENesin-dextromethorphan (ROBITUSSIN DM) 100-10 MG/5ML syrup 5 mL  5 mL Oral Q6H PRN Lurene Shadow, MD   5 mL at 06/21/22 2237   hydrOXYzine (ATARAX) tablet 25 mg  25 mg Oral TID PRN Lorretta Harp, MD       insulin aspart (novoLOG) injection 0-15 Units  0-15 Units Subcutaneous TID WC Lurene Shadow, MD   3 Units at 06/26/22 1213   insulin aspart (novoLOG) injection 0-5 Units  0-5 Units Subcutaneous QHS Lurene Shadow, MD   2 Units at 06/25/22 2238   insulin aspart protamine- aspart (NOVOLOG MIX 70/30) injection 12 Units  12 Units Subcutaneous Q breakfast Baldwin Jamaica, MD   12 Units at 06/24/22 0858   insulin aspart protamine- aspart (NOVOLOG MIX 70/30) injection 18 Units  18 Units Subcutaneous Q supper Baldwin Jamaica, MD   18 Units at 06/25/22 1817   ipratropium-albuterol (DUONEB) 0.5-2.5 (3) MG/3ML nebulizer solution 3 mL  3 mL Nebulization TID  Lurene Shadow, MD   3 mL at 06/26/22 0755   methocarbamol (ROBAXIN) tablet 500 mg  500 mg Oral Q8H PRN Lorretta Harp, MD   500 mg at 06/26/22 0219   multivitamin with minerals tablet 1 tablet  1 tablet Oral Daily Lurene Shadow, MD   1 tablet at 06/25/22 0911   nicotine (NICODERM CQ - dosed in mg/24 hours) patch 21 mg  21 mg Transdermal Daily Lorretta Harp, MD   21 mg at 06/18/22 1454   nitroGLYCERIN (NITROSTAT) SL tablet 0.4 mg  0.4 mg Sublingual Q5 min PRN Lorretta Harp, MD   0.4 mg at 06/20/22 0010   ondansetron (ZOFRAN) injection 4 mg  4 mg Intravenous Q8H PRN Lorretta Harp, MD       Oral care mouth rinse  15 mL Mouth Rinse 4 times per day Lurene Shadow, MD   15 mL at 06/26/22 1206   Oral care mouth rinse  15 mL Mouth Rinse PRN  Lurene Shadow, MD       oxyCODONE-acetaminophen (PERCOCET/ROXICET) 5-325 MG per tablet 1 tablet  1 tablet Oral Q6H PRN Lorretta Harp, MD   1 tablet at 06/25/22 2233   pantoprazole (PROTONIX) EC tablet 40 mg  40 mg Oral Daily Lorretta Harp, MD   40 mg at 06/26/22 0943   polyethylene glycol (MIRALAX / GLYCOLAX) packet 17 g  17 g Oral Daily PRN Kirby Crigler, Mir Mohammed, MD       pravastatin (PRAVACHOL) tablet 40 mg  40 mg Oral QHS Lorretta Harp, MD   40 mg at 06/25/22 2233   senna-docusate (Senokot-S) tablet 1 tablet  1 tablet Oral BID Colon Branch, MD   1 tablet at 06/26/22 8295   tamsulosin (FLOMAX) capsule 0.4 mg  0.4 mg Oral Daily Lorretta Harp, MD   0.4 mg at 06/26/22 6213     Discharge Medications: Please see discharge summary for a list of discharge medications.  Relevant Imaging Results:  Relevant Lab Results:   Additional Information SSN:[id number]  Darolyn Rua, LCSW

## 2022-06-26 NOTE — Progress Notes (Signed)
OT Cancellation Note  Patient Details Name: [name] MRN: [id number] DOB: Dec 15, 1934   Cancelled Treatment:    Reason Eval/Treat Not Completed: Fatigue/lethargy limiting ability to participate;Pain limiting ability to participate. Chart reviewed, upon arrival pt in bed c/o abdominal pain. Family at bedside reports pt fatigued from recent [facility] t/f, request to hold. Will re-attempt as able.   Dessie Coma, M.S. OTR/L  06/26/22, 3:59 PM  ascom [phone]

## 2022-06-26 NOTE — Progress Notes (Signed)
Progress Note   [name]  QIW:[id number] DOB: 08/04/1934  DOA: 06/17/2022 PCP: Susy Frizzle, MD   Brief Narrative:   Medical records reviewed and are as summarized below:  [name] is a 88 y.o. male with a PMH of Chronic Hypoxic Respiratory Failure on 2L [place] at night, COPD, HTN, HL, DM2, CAD s/p CABG, Chronic Diastolic Heart Failure, severe Aortic Stenosis, PAF  s/p ablation and history of DVT (on Eliquis), CKD3, BPH, and severe protein calorie malnutrition who presented to the ED with acute dyspnea and acute hypoxia due to multifocal pneumonia and acute COPD exacerbation.  Patient was treated with antibiotics and steroids.  Of note patient was admitted on 12/18 for pneumonia.  He initially improved but feels a little tired and more short of breath the last couple days.   Chest CT shows no evidence of a PE. Has worked with PT today, is on his baseline O2 but much weaker and they are recommending Home with family and HH vs SNF.   Assessment/Plan:   Principal Problem:   Bilateral pneumonia Active Problems:   COPD exacerbation (HCC)   Acute and chronic respiratory failure with hypoxia (HCC)   Severe sepsis (HCC)   Essential hypertension   Chronic kidney disease, stage 3a (HCC)   Type II diabetes mellitus with renal manifestations (HCC)   Back pain   BPH (benign prostatic hyperplasia)   Paroxysmal atrial fibrillation (HCC)   Cerebrovascular disease   Cigarette smoker   Depression with anxiety   Protein-calorie malnutrition, severe   Body mass index is 16.5 kg/m.  (Underweight)  Acute on Chronic Hypoxic Respiratory Failure: - Oxygen is currently at 2L [place].  His baseline ~ 2L [place].    Hypotension, resolved  Multifocal Pneumonia: CT shows bilateral opacities.  Patient has worsening leukocytosis despite being on cefepime for 7 days, and Flagyl.  Added vancomycin 12/31, will plan to continue Cefepime another 3 days for total 10 day course, today is day 10/10 of  cefepime. Will also DC Vanc today as MRSA screen was negative.  Follow blood cultures continue to trend white count.  AKI : Patient renal function has gradually worsened from 1.09 -1.26 1.55 and improving now,   Lasix being held, on gentle IVF will continue this as renal function is improving now. Lab Results  Component Value Date   CREATININE 1.24 06/25/2022   CREATININE 1.22 06/24/2022   CREATININE 1.55 (H) 06/23/2022   Acute COPD Exacerbation / Chronic Emphysema : Patient has poor residual lung function and continues to have bibasilar wheezing and is short of breath.  He will need a prolonged course of steroids. - This is Day 10 out of 10 of steroids, currently no wheezing. - Continue nebs and Pulmicort.  Chronic Diastolic Heart Failure / Small Bilateral Pleural Effusions:  - Now with worsening renal function. Hold Lasix.   Coronary Artery Disease s/p CABG / Atherosclerosis / History of Stroke: - Aspirin and Statin.  PAF: - Patient follows with Cardiologist Dr. Stanford Breed.  Bilateral Renal Stones: - Abdominal exam is stable.  GERD: - PPI  DM2: Glucose is a little elevated on steroids. - Increase NPH from 10 to 12 units am and 14 to 18 units pm. - Continue Ssi with POC glucose ACHS.  Lower Back Pain: - Lumbar CT showed no acute abnormality.  BPH: Flomax  Prior Nasal Fracture: Follow up with ENT outpatient. Hearing Loss  Chronic Tobacco Use: - Counseled on cessation.  RUL Lung Nodule: - Monitor  outpatient  Severe Protein Calorie Malnutrition: - Ensure TID.  Code Status: DNR/DNI   Diet Order             Diet regular Room service appropriate? Yes; Fluid consistency: Thin  Diet effective now                   Consultants: None  Procedures: None    Medications:    aspirin  325 mg Oral Daily   budesonide (PULMICORT) nebulizer solution  0.5 mg Nebulization BID   Chlorhexidine Gluconate Cloth  6 each Topical Daily   enoxaparin (LOVENOX) injection   40 mg Subcutaneous Q24H   feeding supplement  237 mL Oral TID BM   insulin aspart  0-15 Units Subcutaneous TID WC   insulin aspart  0-5 Units Subcutaneous QHS   insulin aspart protamine- aspart  12 Units Subcutaneous Q breakfast   insulin aspart protamine- aspart  18 Units Subcutaneous Q supper   ipratropium-albuterol  3 mL Nebulization TID   multivitamin with minerals  1 tablet Oral Daily   nicotine  21 mg Transdermal Daily   mouth rinse  15 mL Mouth Rinse 4 times per day   pantoprazole  40 mg Oral Daily   pravastatin  40 mg Oral QHS   senna-docusate  1 tablet Oral BID   tamsulosin  0.4 mg Oral Daily   Continuous Infusions:  ceFEPime (MAXIPIME) IV 200 mL/hr at 06/26/22 0936    Anti-infectives (From admission, onward)    Start     Dose/Rate Route Frequency Ordered Stop   06/24/22 1300  vancomycin (VANCOREADY) IVPB 750 mg/150 mL  Status:  Discontinued        750 mg 150 mL/hr over 60 Minutes Intravenous Every 24 hours 06/24/22 1221 06/26/22 0905   06/24/22 1000  ceFEPIme (MAXIPIME) 2 g in sodium chloride 0.9 % 100 mL IVPB        2 g 200 mL/hr over 30 Minutes Intravenous Every 12 hours 06/24/22 0954 06/27/22 0959   06/23/22 2200  ceFEPIme (MAXIPIME) 2 g in sodium chloride 0.9 % 100 mL IVPB        2 g 200 mL/hr over 30 Minutes Intravenous Every 24 hours 06/23/22 0845 06/23/22 2215   06/23/22 1000  vancomycin (VANCOREADY) IVPB 1250 mg/250 mL        1,250 mg 166.7 mL/hr over 90 Minutes Intravenous  Once 06/23/22 0844 06/23/22 1134   06/23/22 0930  vancomycin (VANCOCIN) IVPB 1000 mg/200 mL premix  Status:  Discontinued        1,000 mg 200 mL/hr over 60 Minutes Intravenous Every 12 hours 06/23/22 0830 06/23/22 0841   06/23/22 0843  vancomycin variable dose per unstable renal function (pharmacist dosing)  Status:  Discontinued         Does not apply See admin instructions 06/23/22 0844 06/25/22 0951   06/22/22 1800  doxycycline (VIBRA-TABS) tablet 100 mg  Status:  Discontinued         100 mg Oral Every 12 hours 06/22/22 1751 06/23/22 0829   06/18/22 1200  vancomycin (VANCOCIN) 750 mg in sodium chloride 0.9 % 250 mL IVPB  Status:  Discontinued        750 mg 250 mL/hr over 60 Minutes Intravenous Every 24 hours 06/17/22 1628 06/18/22 0533   06/18/22 1200  vancomycin (VANCOREADY) IVPB 750 mg/150 mL  Status:  Discontinued        750 mg 150 mL/hr over 60 Minutes Intravenous Every 24 hours 06/18/22 0533 06/18/22 1125  06/17/22 2200  ceFEPIme (MAXIPIME) 2 g in sodium chloride 0.9 % 100 mL IVPB  Status:  Discontinued        2 g 200 mL/hr over 30 Minutes Intravenous Every 12 hours 06/17/22 1628 06/23/22 0845   06/17/22 1600  metroNIDAZOLE (FLAGYL) IVPB 500 mg        500 mg 100 mL/hr over 60 Minutes Intravenous Every 12 hours 06/17/22 1553 06/23/22 1615   06/17/22 1200  vancomycin (VANCOCIN) IVPB 1000 mg/200 mL premix        1,000 mg 200 mL/hr over 60 Minutes Intravenous  Once 06/17/22 1154 06/17/22 1448   06/17/22 1200  ceFEPIme (MAXIPIME) 2 g in sodium chloride 0.9 % 100 mL IVPB        2 g 200 mL/hr over 30 Minutes Intravenous  Once 06/17/22 1154 06/17/22 1330        Family Communication/Anticipated D/C date and plan/Code Status   DVT prophylaxis: enoxaparin (LOVENOX) injection 40 mg Start: 06/24/22 2200     Code Status: DNR  Family Communication: [name], grand daughter over the phone this PM.  Disposition Plan:  PT/OT recommends Home with Home Health if he can be with family. VS SNF. He is medically stable just needs a safe DC plan.  Status is: Inpatient Remains inpatient appropriate because: pneumonia and acute hypoxia  Subjective:   Mr. Shabazz symptoms are stable today, remains with shortness of breath. He also appears fatigued and malaise but seems to be improving slowly.  Objective:    Vitals:   06/26/22 0900 06/26/22 0908 06/26/22 0941 06/26/22 1000  BP:  109/79    Pulse:  85    Resp: 17 16 (!) 21 (!) 22  Temp:  98.1 F (36.7 C)    TempSrc:       SpO2:  95%    Weight:      Height:       No data found.   Intake/Output Summary (Last 24 hours) at 06/26/2022 1144 Last data filed at 06/26/2022 0936 Gross per 24 hour  Intake 1060.23 ml  Output 2500 ml  Net -1439.77 ml    Filed Weights   06/17/22 0958  Weight: 58.3 kg    Exam:  Physical Exam Vitals reviewed.  Constitutional:      General: He is not in acute distress.    Appearance: He is ill-appearing. He is not toxic-appearing.     Comments: Chronically ill Acute respiratory distress Fatigue malaise  malnourished  HENT:     Head: Normocephalic and atraumatic.     Mouth/Throat:     Mouth: Mucous membranes are moist.  Eyes:     Extraocular Movements: Extraocular movements intact.     Pupils: Pupils are equal, round, and reactive to light.  Cardiovascular:     Rate and Rhythm: Normal rate and regular rhythm.     Pulses: Normal pulses.     Heart sounds: Normal heart sounds.  Pulmonary:     Effort: Respiratory distress present.     Breath sounds: No wheezing.  Abdominal:     General: There is no distension.     Palpations: Abdomen is soft.     Tenderness: There is no abdominal tenderness.  Musculoskeletal:        General: Normal range of motion.     Cervical back: Normal range of motion and neck supple.  Skin:    General: Skin is warm.     Capillary Refill: Capillary refill takes less than 2 seconds.  Neurological:  General: No focal deficit present.     Mental Status: He is alert.  Psychiatric:        Mood and Affect: Mood normal.     Pressure Injury 06/11/22 Sacrum Mid Stage 2 -  Partial thickness loss of dermis presenting as a shallow open injury with a red, pink wound bed without slough. (Active)  06/11/22 2300  Location: Sacrum  Location Orientation: Mid  Staging: Stage 2 -  Partial thickness loss of dermis presenting as a shallow open injury with a red, pink wound bed without slough.  Wound Description (Comments):   Present on Admission: Yes   Dressing Type Foam - Lift dressing to assess site every shift 06/26/22 0936   Data Reviewed:   I have personally reviewed following labs and imaging studies:  Labs: Labs show the following:   Basic Metabolic Panel: Recent Labs  Lab 06/21/22 0847 06/22/22 0537 06/23/22 0554 06/24/22 0508 06/25/22 0603  NA 138 135 138 138  --   K 3.9 3.9 4.1 3.8  --   CL 102 98 99 100  --   CO2 27 29 28 28   --   GLUCOSE 255* 301* 232* 104*  --   BUN 36* 51* 59* 53*  --   CREATININE 1.09 1.26* 1.55* 1.22 1.24  CALCIUM 8.4* 8.3* 8.3* 8.2*  --   MG 1.7 1.9  --   --   --     GFR Estimated Creatinine Clearance: 35.9 mL/min (by C-G formula based on SCr of 1.24 mg/dL). Liver Function Tests: Recent Labs  Lab 06/24/22 0508  AST 18  ALT 15  ALKPHOS 57  BILITOT 0.9  PROT 6.9  ALBUMIN 2.5*     CBC: Recent Labs  Lab 06/20/22 0214 06/21/22 0847 06/22/22 0537 06/23/22 0554 06/24/22 0508  WBC 14.1* 16.2* 16.8* 22.2* 22.6*  NEUTROABS 10.4* 12.8* 13.4*  --   --   HGB 10.5* 12.3* 11.7* 11.3* 11.7*  HCT 31.6* 36.6* 35.4* 34.1* 36.2*  MCV 86.6 85.5 86.1 86.5 87.4  PLT 346 387 377 403* 409*     CBG: Recent Labs  Lab 06/25/22 0758 06/25/22 1154 06/25/22 1718 06/25/22 2215 06/26/22 0909  GLUCAP 103* 193* 354* 225* 102*     Sepsis Labs: Recent Labs  Lab 06/21/22 0847 06/22/22 0537 06/23/22 0554 06/24/22 0508  WBC 16.2* 16.8* 22.2* 22.6*     Microbiology Recent Results (from the past 240 hour(s))  Resp panel by RT-PCR (RSV, Flu A&B, Covid) Anterior Nasal Swab     Status: None   Collection Time: 06/17/22 10:40 AM   Specimen: Anterior Nasal Swab  Result Value Ref Range Status   SARS Coronavirus 2 by RT PCR NEGATIVE NEGATIVE Final    Comment: (NOTE) SARS-CoV-2 target nucleic acids are NOT DETECTED.  The SARS-CoV-2 RNA is generally detectable in upper respiratory specimens during the acute phase of infection. The lowest concentration of SARS-CoV-2 viral copies this  assay can detect is 138 copies/mL. A negative result does not preclude SARS-Cov-2 infection and should not be used as the sole basis for treatment or other patient management decisions. A negative result may occur with  improper specimen collection/handling, submission of specimen other than nasopharyngeal swab, presence of viral mutation(s) within the areas targeted by this assay, and inadequate number of viral copies(<138 copies/mL). A negative result must be combined with clinical observations, patient history, and epidemiological information. The expected result is Negative.  Fact Sheet for Patients:  EntrepreneurPulse.com.au  Fact Sheet for Healthcare Providers:  IncredibleEmployment.be  This test is no t yet approved or cleared by the [place] and  has been authorized for detection and/or diagnosis of SARS-CoV-2 by FDA under an Emergency Use Authorization (EUA). This EUA will remain  in effect (meaning this test can be used) for the duration of the COVID-19 declaration under Section 564(b)(1) of the Act, 21 U.S.C.section 360bbb-3(b)(1), unless the authorization is terminated  or revoked sooner.       Influenza A by PCR NEGATIVE NEGATIVE Final   Influenza B by PCR NEGATIVE NEGATIVE Final    Comment: (NOTE) The Xpert Xpress SARS-CoV-2/FLU/RSV plus assay is intended as an aid in the diagnosis of influenza from Nasopharyngeal swab specimens and should not be used as a sole basis for treatment. Nasal washings and aspirates are unacceptable for Xpert Xpress SARS-CoV-2/FLU/RSV testing.  Fact Sheet for Patients: EntrepreneurPulse.com.au  Fact Sheet for Healthcare Providers: IncredibleEmployment.be  This test is not yet approved or cleared by the [place] FDA and has been authorized for detection and/or diagnosis of SARS-CoV-2 by FDA under an Emergency Use Authorization (EUA). This EUA will  remain in effect (meaning this test can be used) for the duration of the COVID-19 declaration under Section 564(b)(1) of the Act, 21 U.S.C. section 360bbb-3(b)(1), unless the authorization is terminated or revoked.     Resp Syncytial Virus by PCR NEGATIVE NEGATIVE Final    Comment: (NOTE) Fact Sheet for Patients: EntrepreneurPulse.com.au  Fact Sheet for Healthcare Providers: IncredibleEmployment.be  This test is not yet approved or cleared by the [place] FDA and has been authorized for detection and/or diagnosis of SARS-CoV-2 by FDA under an Emergency Use Authorization (EUA). This EUA will remain in effect (meaning this test can be used) for the duration of the COVID-19 declaration under Section 564(b)(1) of the Act, 21 U.S.C. section 360bbb-3(b)(1), unless the authorization is terminated or revoked.  Performed at [facility], [address] [place], [place] [address]   Culture, blood (routine x 2)     Status: None   Collection Time: 06/17/22 10:43 AM   Specimen: BLOOD RIGHT FOREARM  Result Value Ref Range Status   Specimen Description BLOOD RIGHT FOREARM  Final   Special Requests   Final    BOTTLES DRAWN AEROBIC AND ANAEROBIC Blood Culture adequate volume   Culture   Final    NO GROWTH 5 DAYS Performed at [facility], [address], [place], [place] [address]    Report Status 06/22/2022 FINAL  Final  Culture, blood (routine x 2)     Status: None   Collection Time: 06/17/22 10:43 AM   Specimen: BLOOD RIGHT HAND  Result Value Ref Range Status   Specimen Description BLOOD RIGHT HAND  Final   Special Requests   Final    BOTTLES DRAWN AEROBIC AND ANAEROBIC Blood Culture adequate volume   Culture   Final    NO GROWTH 5 DAYS Performed at [facility], [address], [place], [place] [address]    Report Status 06/22/2022 FINAL  Final  MRSA Next Gen by PCR, Nasal     Status: None   Collection Time:  06/17/22  6:59 PM   Specimen: Nasal Mucosa; Nasal Swab  Result Value Ref Range Status   MRSA by PCR Next Gen NOT DETECTED NOT DETECTED Final    Comment: (NOTE) The GeneXpert MRSA Assay (FDA approved for NASAL specimens only), is one component of a comprehensive MRSA colonization surveillance program. It is not intended to diagnose MRSA  infection nor to guide or monitor treatment for MRSA infections. Test performance is not FDA approved in patients less than 26 years old. Performed at [facility], [address] [place], [place] [address]   Culture, blood (Routine X 2) w Reflex to ID Panel     Status: None (Preliminary result)   Collection Time: 06/23/22  9:00 AM   Specimen: BLOOD  Result Value Ref Range Status   Specimen Description BLOOD BLOOD LEFT HAND  Final   Special Requests   Final    BOTTLES DRAWN AEROBIC AND ANAEROBIC Blood Culture adequate volume   Culture   Final    NO GROWTH 3 DAYS Performed at [facility], [address], [place], [place] [address]    Report Status PENDING  Incomplete  Culture, blood (Routine X 2) w Reflex to ID Panel     Status: None (Preliminary result)   Collection Time: 06/23/22  9:02 AM   Specimen: BLOOD  Result Value Ref Range Status   Specimen Description BLOOD LFOA  Final   Special Requests   Final    BOTTLES DRAWN AEROBIC AND ANAEROBIC Blood Culture adequate volume   Culture   Final    NO GROWTH 3 DAYS Performed at [facility], [address] [place], [place] [address]    Report Status PENDING  Incomplete  MRSA Next Gen by PCR, Nasal     Status: None   Collection Time: 06/25/22 12:29 PM   Specimen: Nasal Mucosa; Nasal Swab  Result Value Ref Range Status   MRSA by PCR Next Gen NOT DETECTED NOT DETECTED Final    Comment: (NOTE) The GeneXpert MRSA Assay (FDA approved for NASAL specimens only), is one component of a comprehensive MRSA colonization surveillance program. It is not intended to diagnose MRSA  infection nor to guide or monitor treatment for MRSA infections. Test performance is not FDA approved in patients less than 48 years old. Performed at [facility], [address] [place], [place] [address]     Procedures and diagnostic studies:  No results found.   LOS: 9 days   Mayfield [employer]  [facility]   Pager on [email or web address]. If 7PM-7AM, please contact night-coverage at [email or web address]   06/26/2022, 11:44 AM

## 2022-06-26 NOTE — Progress Notes (Signed)
Nutrition Follow-up  DOCUMENTATION CODES:   Underweight, Severe malnutrition in context of chronic illness  INTERVENTION:   -Continue MVI with minerals daily -Continue Ensure Enlive po TID, each supplement provides 350 kcal and 20 grams of protein.  -Continue Magic cup TID with meals, each supplement provides 290 kcal and 9 grams of protein  -Continue liberalized diet of regular  NUTRITION DIAGNOSIS:   Severe Malnutrition related to chronic illness (COPD) as evidenced by severe fat depletion, severe muscle depletion.  Ongoing  GOAL:   Patient will meet greater than or equal to 90% of their needs  Progressing   MONITOR:   PO intake, Supplement acceptance  REASON FOR ASSESSMENT:   Consult Assessment of nutrition requirement/status, Poor PO  ASSESSMENT:   Pt with medical history significant for recent discharge from the hospital on 06/14/2022 for community-acquired pneumonia, CAD s/p CABG, hypertension, hyperlipidemia, diabetes mellitus, COPD, chronic hypoxic respiratory failure on 2 L/min oxygen at night, severe AS, depression with anxiety, tobacco abuse, A flutter/A-fib, CKD-3A, DVT, aspiration pneumonia, BPH, who presented to the hospital with generalized weakness, low back pain, shortness of breath and dry cough.  Reviewed I/O's: -2.1 L x 24 hours and -9.9 L since admission  UOP: 3.3 L x 24 hours  Pt receiving nursing care at time of visit.   Pt with improved oral intake. Noted meal completions 25-100%. Pt with variable acceptance of supplements.   Per TOC notes, plan for SNF placement at discharge.    Medications reviewed and include senokot.   Labs reviewed: CBGS: 102-354 (inpatient orders for glycemic control are 0-15 units insulin aspart TID with meals, 0-5 units insulin aspart daily at bedtime,18 units insulin aspart protamine-aspart daily, and 12 units insulin aspart protamine-aspart daily).    Diet Order:   Diet Order             Diet regular Room  service appropriate? Yes; Fluid consistency: Thin  Diet effective now                   EDUCATION NEEDS:   Education needs have been addressed  Skin:  Skin Assessment: Skin Integrity Issues: Skin Integrity Issues:: Stage II, Other (Comment) Stage II: sacrum Other: lacerations to rt jae and lt face  Last BM:  06/26/22 (type 6)  Height:   Ht Readings from Last 1 Encounters:  06/17/22 6\' 2"  (1.88 m)    Weight:   Wt Readings from Last 1 Encounters:  06/17/22 58.3 kg    Ideal Body Weight:  86.4 kg  BMI:  Body mass index is 16.5 kg/m.  Estimated Nutritional Needs:   Kcal:  2100-2300  Protein:  105-120 grams  Fluid:  > 2 L    Loistine Chance, RD, LDN, [place] Registered Dietitian II Certified Diabetes Care and Education Specialist Please refer to [facility] for RD and/or RD on-call/weekend/after hours pager

## 2022-06-26 NOTE — Inpatient Diabetes Management (Signed)
Inpatient Diabetes Program Recommendations  AACE/ADA: New Consensus Statement on Inpatient Glycemic Control   Target Ranges:  Prepandial:   less than 140 mg/dL      Peak postprandial:   less than 180 mg/dL (1-2 hours)      Critically ill patients:  140 - 180 mg/dL    Latest Reference Range & Units 06/25/22 07:58 06/25/22 11:54 06/25/22 17:18 06/25/22 22:15  Glucose-Capillary 70 - 99 mg/dL 103 (H) 193 (H) 354 (H) 225 (H)   Review of Glycemic Control  Diabetes history: DM2 Outpatient Diabetes medications: 70/30 8 units QAM, 70/30 12 units QPM Current orders for Inpatient glycemic control: 70/30 12 units QAM, 70/30 18 units QPM, Novolog 0-15 units TID with meals, Novolog 0-5 units QHS  Inpatient Diabetes Program Recommendations:    Insulin: Morning dose of 70/30 was NOT GIVEN on 06/25/22. As a result, glucose up to 354 mg/dl at 17:18.   Thanks, Barnie Alderman, RN, MSN, [place] Diabetes Coordinator Inpatient Diabetes Program [phone] (Team Pager from 8am to [place])

## 2022-06-27 DIAGNOSIS — J441 Chronic obstructive pulmonary disease with (acute) exacerbation: Secondary | ICD-10-CM | POA: Diagnosis not present

## 2022-06-27 DIAGNOSIS — J9621 Acute and chronic respiratory failure with hypoxia: Secondary | ICD-10-CM | POA: Diagnosis not present

## 2022-06-27 LAB — BASIC METABOLIC PANEL
Anion gap: 15 (ref 5–15)
BUN: 49 mg/dL — ABNORMAL HIGH (ref 8–23)
CO2: 20 mmol/L — ABNORMAL LOW (ref 22–32)
Calcium: 8.5 mg/dL — ABNORMAL LOW (ref 8.9–10.3)
Chloride: 103 mmol/L (ref 98–111)
Creatinine, Ser: 1.5 mg/dL — ABNORMAL HIGH (ref 0.61–1.24)
GFR, Estimated: 45 mL/min — ABNORMAL LOW (ref >60)
Glucose, Bld: 173 mg/dL — ABNORMAL HIGH (ref 70–99)
Potassium: 4.8 mmol/L (ref 3.5–5.1)
Sodium: 138 mmol/L (ref 135–145)

## 2022-06-27 LAB — GLUCOSE, CAPILLARY
Glucose-Capillary: 158 mg/dL — ABNORMAL HIGH (ref 70–99)
Glucose-Capillary: 165 mg/dL — ABNORMAL HIGH (ref 70–99)
Glucose-Capillary: 124 mg/dL — ABNORMAL HIGH (ref 70–99)
Glucose-Capillary: 37 mg/dL — CL (ref 70–99)
Glucose-Capillary: 38 mg/dL — CL (ref 70–99)
Glucose-Capillary: 190 mg/dL — ABNORMAL HIGH (ref 70–99)
Glucose-Capillary: 155 mg/dL — ABNORMAL HIGH (ref 70–99)

## 2022-06-27 LAB — CBC
HCT: 35.4 % — ABNORMAL LOW (ref 39.0–52.0)
Hemoglobin: 11.2 g/dL — ABNORMAL LOW (ref 13.0–17.0)
MCH: 28.4 pg (ref 26.0–34.0)
MCHC: 31.6 g/dL (ref 30.0–36.0)
MCV: 89.8 fL (ref 80.0–100.0)
Platelets: 370 10*3/uL (ref 150–400)
RBC: 3.94 MIL/uL — ABNORMAL LOW (ref 4.22–5.81)
RDW: 17 % — ABNORMAL HIGH (ref 11.5–15.5)
WBC: 14.9 10*3/uL — ABNORMAL HIGH (ref 4.0–10.5)
nRBC: 0 % (ref 0.0–0.2)

## 2022-06-27 MED ORDER — IPRATROPIUM-ALBUTEROL 0.5-2.5 (3) MG/3ML IN SOLN: 3.0000 mL | RESPIRATORY_TRACT | Status: DC | PRN

## 2022-06-27 MED ORDER — ENOXAPARIN SODIUM 30 MG/0.3ML IJ SOSY: 30.0000 mg | PREFILLED_SYRINGE | INTRAMUSCULAR | Status: DC

## 2022-06-27 MED ORDER — MORPHINE SULFATE (PF) 2 MG/ML IV SOLN
1.0000 mg | INTRAVENOUS | Status: DC | PRN
  Administered 2022-06-27 – 2022-06-28 (×5): 1 mg via INTRAVENOUS
  Filled 2022-06-27 (×5): qty 1

## 2022-06-27 MED ORDER — DEXTROSE 50 % IV SOLN
25.0000 g | INTRAVENOUS | Status: AC
  Filled 2022-06-27: qty 50

## 2022-06-27 MED ORDER — DEXTROSE 50 % IV SOLN
INTRAVENOUS | Status: AC
  Administered 2022-06-27: 25 g via INTRAVENOUS
  Filled 2022-06-27: qty 50

## 2022-06-27 MED ORDER — IPRATROPIUM-ALBUTEROL 0.5-2.5 (3) MG/3ML IN SOLN
3.0000 mL | Freq: Four times a day (QID) | RESPIRATORY_TRACT | Status: DC
  Administered 2022-06-27 (×4): 3 mL via RESPIRATORY_TRACT
  Filled 2022-06-27 (×3): qty 3

## 2022-06-27 MED ORDER — MORPHINE SULFATE (PF) 2 MG/ML IV SOLN
1.0000 mg | Freq: Once | INTRAVENOUS | Status: AC
  Administered 2022-06-27: 1 mg via INTRAVENOUS
  Filled 2022-06-27: qty 1

## 2022-06-27 MED ORDER — IPRATROPIUM-ALBUTEROL 0.5-2.5 (3) MG/3ML IN SOLN
3.0000 mL | Freq: Three times a day (TID) | RESPIRATORY_TRACT | Status: DC
  Administered 2022-06-28: 3 mL via RESPIRATORY_TRACT
  Filled 2022-06-27: qty 3

## 2022-06-27 NOTE — Progress Notes (Signed)
MD has not responded to page.  Patient's BS stabilized at 190 after given dextrose (SEE MAR).

## 2022-06-27 NOTE — Progress Notes (Signed)
       CROSS COVER NOTE  NAME: [name] MRN: [id number] DOB : 1935/01/14   HPI/Events of Note   Nurse reports patient s family with concerns regarding hypoglycemic event, decreased responsiveness and possible need for dextrose fluids   Assessment and  Interventions   Assessment: Frail patient appears to be in active dying process based on decline in functional status, decreased intake and fluctuating responsiveness as wll as breathing pattern with periods of apnea CBG 155,  BP 116/62. HR 73, oxygen sats 97   Discussed with family active dying process and addressed concerns Plan: Hold off on initiating dextrose fluids unless blood sugar drops - CBG every 4 hours Morphine for pain behaviors, dyspnea       Kathlene Cote NP [facility]

## 2022-06-27 NOTE — Progress Notes (Addendum)
Date and time results received: 06/27/22 1620   Test: Blood Glucose (fingerstick) Critical Value: 38 and 37  Name of Provider Notified: Paged Dr. Hollice Gong  Paged MD regarding low BS and lethargy of patient.    Primary nurse followed hypoglycemic protocol.  Patient given dextrose (SEE MAR)

## 2022-06-27 NOTE — Progress Notes (Signed)
Progress Note   [name]  MVE:[id number] DOB: 07/23/1934  DOA: 06/17/2022 PCP: Susy Frizzle, MD   Brief Narrative:   Medical records reviewed and are as summarized below:  [name] is a 88 y.o. male with a PMH of Chronic Hypoxic Respiratory Failure on 2L [place] at night, COPD, HTN, HL, DM2, CAD s/p CABG, Chronic Diastolic Heart Failure, severe Aortic Stenosis, PAF  s/p ablation and history of DVT (on Eliquis), CKD3, BPH, and severe protein calorie malnutrition who presented to the ED with acute dyspnea and acute hypoxia due to multifocal pneumonia and acute COPD exacerbation.  Patient was treated with antibiotics and steroids.  Of note patient was admitted on 12/18 for pneumonia.  He initially improved but feels a little tired and more short of breath the last couple days.   Chest CT shows no evidence of a PE. Has worked with PT today, is on his baseline O2 but much weaker and they are recommending Home with family and HH vs SNF.   Assessment/Plan:   Principal Problem:   Bilateral pneumonia Active Problems:   COPD exacerbation (HCC)   Acute and chronic respiratory failure with hypoxia (HCC)   Severe sepsis (HCC)   Essential hypertension   Chronic kidney disease, stage 3a (HCC)   Type II diabetes mellitus with renal manifestations (HCC)   Back pain   BPH (benign prostatic hyperplasia)   Paroxysmal atrial fibrillation (HCC)   Cerebrovascular disease   Cigarette smoker   Depression with anxiety   Protein-calorie malnutrition, severe   Body mass index is 16.5 kg/m.  (Underweight)  Acute on Chronic Hypoxic Respiratory Failure: - Oxygen is currently at 2L [place].  His baseline ~ 2L [place].    Hypotension, resolved  Multifocal Pneumonia: CT shows bilateral opacities.  Patient has worsening leukocytosis despite being on cefepime for 7 days, and Flagyl.  Added vancomycin 12/31, will plan to continue Cefepime another 3 days for total 10 day course, completed course of  antibtiocs on 1/3.  Follow blood cultures continue to trend white count.  AKI : Patient renal function has gradually worsened but overall improved now.   Lasix being held, on gentle IVF will continue this as renal function is improving now. Lab Results  Component Value Date   CREATININE 1.50 (H) 06/27/2022   CREATININE 1.24 06/25/2022   CREATININE 1.22 06/24/2022   Acute COPD Exacerbation / Chronic Emphysema : Patient has poor residual lung function and continues to have bibasilar wheezing and is short of breath.  He will need a prolonged course of steroids. - Continue nebs and Pulmicort.  Chronic Diastolic Heart Failure / Small Bilateral Pleural Effusions:  - Now with worsening renal function. Hold Lasix.   Coronary Artery Disease s/p CABG / Atherosclerosis / History of Stroke: - Aspirin and Statin.  PAF: - Patient follows with Cardiologist Dr. Stanford Breed.  Bilateral Renal Stones: - Abdominal exam is stable.  GERD: - PPI  DM2: Glucose is a little elevated on steroids, which are now completed. - Increase NPH from 10 to 12 units am and 14 to 18 units pm. - Continue Ssi with POC glucose ACHS.  Lower Back Pain: - Lumbar CT showed no acute abnormality.  BPH: Flomax  Prior Nasal Fracture: Follow up with ENT outpatient. Hearing Loss  Chronic Tobacco Use: - Counseled on cessation.  RUL Lung Nodule: - Monitor outpatient  Severe Protein Calorie Malnutrition: - Ensure TID.  Code Status: DNR/DNI  Diet Order  Diet regular Room service appropriate? Yes; Fluid consistency: Thin  Diet effective now                  Consultants: None  Procedures: None  Medications:    aspirin  325 mg Oral Daily   budesonide (PULMICORT) nebulizer solution  0.5 mg Nebulization BID   Chlorhexidine Gluconate Cloth  6 each Topical Daily   enoxaparin (LOVENOX) injection  40 mg Subcutaneous Q24H   feeding supplement  237 mL Oral TID BM   insulin aspart  0-15 Units  Subcutaneous TID WC   insulin aspart  0-5 Units Subcutaneous QHS   insulin aspart protamine- aspart  12 Units Subcutaneous Q breakfast   insulin aspart protamine- aspart  18 Units Subcutaneous Q supper   ipratropium-albuterol  3 mL Nebulization Q6H   multivitamin with minerals  1 tablet Oral Daily   nicotine  21 mg Transdermal Daily   nystatin  5 mL Oral QID   mouth rinse  15 mL Mouth Rinse 4 times per day   pantoprazole  40 mg Oral Daily   pravastatin  40 mg Oral QHS   senna-docusate  1 tablet Oral BID   tamsulosin  0.4 mg Oral Daily   Continuous Infusions:   Anti-infectives (From admission, onward)    Start     Dose/Rate Route Frequency Ordered Stop   06/24/22 1300  vancomycin (VANCOREADY) IVPB 750 mg/150 mL  Status:  Discontinued        750 mg 150 mL/hr over 60 Minutes Intravenous Every 24 hours 06/24/22 1221 06/26/22 0905   06/24/22 1000  ceFEPIme (MAXIPIME) 2 g in sodium chloride 0.9 % 100 mL IVPB        2 g 200 mL/hr over 30 Minutes Intravenous Every 12 hours 06/24/22 0954 06/26/22 2231   06/23/22 2200  ceFEPIme (MAXIPIME) 2 g in sodium chloride 0.9 % 100 mL IVPB        2 g 200 mL/hr over 30 Minutes Intravenous Every 24 hours 06/23/22 0845 06/23/22 2215   06/23/22 1000  vancomycin (VANCOREADY) IVPB 1250 mg/250 mL        1,250 mg 166.7 mL/hr over 90 Minutes Intravenous  Once 06/23/22 0844 06/23/22 1134   06/23/22 0930  vancomycin (VANCOCIN) IVPB 1000 mg/200 mL premix  Status:  Discontinued        1,000 mg 200 mL/hr over 60 Minutes Intravenous Every 12 hours 06/23/22 0830 06/23/22 0841   06/23/22 0843  vancomycin variable dose per unstable renal function (pharmacist dosing)  Status:  Discontinued         Does not apply See admin instructions 06/23/22 0844 06/25/22 0951   06/22/22 1800  doxycycline (VIBRA-TABS) tablet 100 mg  Status:  Discontinued        100 mg Oral Every 12 hours 06/22/22 1751 06/23/22 0829   06/18/22 1200  vancomycin (VANCOCIN) 750 mg in sodium chloride  0.9 % 250 mL IVPB  Status:  Discontinued        750 mg 250 mL/hr over 60 Minutes Intravenous Every 24 hours 06/17/22 1628 06/18/22 0533   06/18/22 1200  vancomycin (VANCOREADY) IVPB 750 mg/150 mL  Status:  Discontinued        750 mg 150 mL/hr over 60 Minutes Intravenous Every 24 hours 06/18/22 0533 06/18/22 1125   06/17/22 2200  ceFEPIme (MAXIPIME) 2 g in sodium chloride 0.9 % 100 mL IVPB  Status:  Discontinued        2 g 200 mL/hr over 30  Minutes Intravenous Every 12 hours 06/17/22 1628 06/23/22 0845   06/17/22 1600  metroNIDAZOLE (FLAGYL) IVPB 500 mg        500 mg 100 mL/hr over 60 Minutes Intravenous Every 12 hours 06/17/22 1553 06/23/22 1615   06/17/22 1200  vancomycin (VANCOCIN) IVPB 1000 mg/200 mL premix        1,000 mg 200 mL/hr over 60 Minutes Intravenous  Once 06/17/22 1154 06/17/22 1448   06/17/22 1200  ceFEPIme (MAXIPIME) 2 g in sodium chloride 0.9 % 100 mL IVPB        2 g 200 mL/hr over 30 Minutes Intravenous  Once 06/17/22 1154 06/17/22 1330      Family Communication/Anticipated D/C date and plan/Code Status   DVT prophylaxis: enoxaparin (LOVENOX) injection 40 mg Start: 06/24/22 2200    Code Status: DNR  Family Communication: [name], grand daughter over the phone this PM.  Disposition Plan:  Discussed with [name] in person this AM, plan for SNF DC.  Status is: Inpatient Remains inpatient appropriate because: pneumonia and acute hypoxia  Subjective:   [name] symptoms are stable today, remains with shortness of breath. Overall doing well but very weak.  Objective:    Vitals:   06/27/22 0100 06/27/22 0313 06/27/22 0728 06/27/22 0825  BP: 121/65 133/63 (!) 148/67   Pulse: 94 94 83   Resp: 20 18 19    Temp: 98.8 F (37.1 C)  97.8 F (36.6 C)   TempSrc:      SpO2: 95% 94% 100% 100%  Weight:      Height:       No data found.   Intake/Output Summary (Last 24 hours) at 06/27/2022 1028 Last data filed at 06/27/2022 0728 Gross per 24 hour  Intake 99.69  ml  Output 965 ml  Net -865.31 ml    Filed Weights   06/17/22 0958  Weight: 58.3 kg    Exam:  Physical Exam Vitals reviewed.  Constitutional:      General: He is not in acute distress.    Appearance: He is ill-appearing. He is not toxic-appearing.     Comments: Chronically ill Acute respiratory distress Fatigue malaise  malnourished  HENT:     Head: Normocephalic and atraumatic.     Mouth/Throat:     Mouth: Mucous membranes are moist.  Eyes:     Extraocular Movements: Extraocular movements intact.     Pupils: Pupils are equal, round, and reactive to light.  Cardiovascular:     Rate and Rhythm: Normal rate and regular rhythm.     Pulses: Normal pulses.     Heart sounds: Normal heart sounds.  Pulmonary:     Effort: Respiratory distress present.     Breath sounds: No wheezing.  Abdominal:     General: There is no distension.     Palpations: Abdomen is soft.     Tenderness: There is no abdominal tenderness.  Musculoskeletal:        General: Normal range of motion.     Cervical back: Normal range of motion and neck supple.  Skin:    General: Skin is warm.     Capillary Refill: Capillary refill takes less than 2 seconds.  Neurological:     General: No focal deficit present.     Mental Status: He is alert.  Psychiatric:        Mood and Affect: Mood normal.     Pressure Injury 06/11/22 Sacrum Mid Stage 2 -  Partial thickness loss of dermis presenting as a  shallow open injury with a red, pink wound bed without slough. (Active)  06/11/22 2300  Location: Sacrum  Location Orientation: Mid  Staging: Stage 2 -  Partial thickness loss of dermis presenting as a shallow open injury with a red, pink wound bed without slough.  Wound Description (Comments):   Present on Admission: Yes  Dressing Type Foam - Lift dressing to assess site every shift 06/27/22 0728   Data Reviewed:   I have personally reviewed following labs and imaging studies:  Labs: Labs show the  following:   Basic Metabolic Panel: Recent Labs  Lab 06/21/22 0847 06/22/22 0537 06/23/22 0554 06/24/22 0508 06/25/22 0603 06/27/22 0555  NA 138 135 138 138  --  138  K 3.9 3.9 4.1 3.8  --  4.8  CL 102 98 99 100  --  103  CO2 27 29 28 28   --  20*  GLUCOSE 255* 301* 232* 104*  --  173*  BUN 36* 51* 59* 53*  --  49*  CREATININE 1.09 1.26* 1.55* 1.22 1.24 1.50*  CALCIUM 8.4* 8.3* 8.3* 8.2*  --  8.5*  MG 1.7 1.9  --   --   --   --     GFR Estimated Creatinine Clearance: 29.7 mL/min (A) (by C-G formula based on SCr of 1.5 mg/dL (H)). Liver Function Tests: Recent Labs  Lab 06/24/22 0508  AST 18  ALT 15  ALKPHOS 57  BILITOT 0.9  PROT 6.9  ALBUMIN 2.5*   CBC: Recent Labs  Lab 06/21/22 0847 06/22/22 0537 06/23/22 0554 06/24/22 0508 06/27/22 0555  WBC 16.2* 16.8* 22.2* 22.6* 14.9*  NEUTROABS 12.8* 13.4*  --   --   --   HGB 12.3* 11.7* 11.3* 11.7* 11.2*  HCT 36.6* 35.4* 34.1* 36.2* 35.4*  MCV 85.5 86.1 86.5 87.4 89.8  PLT 387 377 403* 409* 370     CBG: Recent Labs  Lab 06/26/22 1211 06/26/22 1629 06/26/22 2208 06/27/22 0329 06/27/22 0726  GLUCAP 173* 123* 109* 158* 165*   Sepsis Labs: Recent Labs  Lab 06/22/22 0537 06/23/22 0554 06/24/22 0508 06/27/22 0555  WBC 16.8* 22.2* 22.6* 14.9*   Microbiology Recent Results (from the past 240 hour(s))  Resp panel by RT-PCR (RSV, Flu A&B, Covid) Anterior Nasal Swab     Status: None   Collection Time: 06/17/22 10:40 AM   Specimen: Anterior Nasal Swab  Result Value Ref Range Status   SARS Coronavirus 2 by RT PCR NEGATIVE NEGATIVE Final    Comment: (NOTE) SARS-CoV-2 target nucleic acids are NOT DETECTED.  The SARS-CoV-2 RNA is generally detectable in upper respiratory specimens during the acute phase of infection. The lowest concentration of SARS-CoV-2 viral copies this assay can detect is 138 copies/mL. A negative result does not preclude SARS-Cov-2 infection and should not be used as the sole basis for  treatment or other patient management decisions. A negative result may occur with  improper specimen collection/handling, submission of specimen other than nasopharyngeal swab, presence of viral mutation(s) within the areas targeted by this assay, and inadequate number of viral copies(<138 copies/mL). A negative result must be combined with clinical observations, patient history, and epidemiological information. The expected result is Negative.  Fact Sheet for Patients:  EntrepreneurPulse.com.au  Fact Sheet for Healthcare Providers:  IncredibleEmployment.be  This test is no t yet approved or cleared by the [place] FDA and  has been authorized for detection and/or diagnosis of SARS-CoV-2 by FDA under an Emergency Use Authorization (EUA). This EUA will  remain  in effect (meaning this test can be used) for the duration of the COVID-19 declaration under Section 564(b)(1) of the Act, 21 U.S.C.section 360bbb-3(b)(1), unless the authorization is terminated  or revoked sooner.       Influenza A by PCR NEGATIVE NEGATIVE Final   Influenza B by PCR NEGATIVE NEGATIVE Final    Comment: (NOTE) The Xpert Xpress SARS-CoV-2/FLU/RSV plus assay is intended as an aid in the diagnosis of influenza from Nasopharyngeal swab specimens and should not be used as a sole basis for treatment. Nasal washings and aspirates are unacceptable for Xpert Xpress SARS-CoV-2/FLU/RSV testing.  Fact Sheet for Patients: EntrepreneurPulse.com.au  Fact Sheet for Healthcare Providers: IncredibleEmployment.be  This test is not yet approved or cleared by the [place] FDA and has been authorized for detection and/or diagnosis of SARS-CoV-2 by FDA under an Emergency Use Authorization (EUA). This EUA will remain in effect (meaning this test can be used) for the duration of the COVID-19 declaration under Section 564(b)(1) of the Act, 21  U.S.C. section 360bbb-3(b)(1), unless the authorization is terminated or revoked.     Resp Syncytial Virus by PCR NEGATIVE NEGATIVE Final    Comment: (NOTE) Fact Sheet for Patients: EntrepreneurPulse.com.au  Fact Sheet for Healthcare Providers: IncredibleEmployment.be  This test is not yet approved or cleared by the [place] FDA and has been authorized for detection and/or diagnosis of SARS-CoV-2 by FDA under an Emergency Use Authorization (EUA). This EUA will remain in effect (meaning this test can be used) for the duration of the COVID-19 declaration under Section 564(b)(1) of the Act, 21 U.S.C. section 360bbb-3(b)(1), unless the authorization is terminated or revoked.  Performed at [facility], [address] [place], [place] [address]   Culture, blood (routine x 2)     Status: None   Collection Time: 06/17/22 10:43 AM   Specimen: BLOOD RIGHT FOREARM  Result Value Ref Range Status   Specimen Description BLOOD RIGHT FOREARM  Final   Special Requests   Final    BOTTLES DRAWN AEROBIC AND ANAEROBIC Blood Culture adequate volume   Culture   Final    NO GROWTH 5 DAYS Performed at [facility], [address], [place], [place] [address]    Report Status 06/22/2022 FINAL  Final  Culture, blood (routine x 2)     Status: None   Collection Time: 06/17/22 10:43 AM   Specimen: BLOOD RIGHT HAND  Result Value Ref Range Status   Specimen Description BLOOD RIGHT HAND  Final   Special Requests   Final    BOTTLES DRAWN AEROBIC AND ANAEROBIC Blood Culture adequate volume   Culture   Final    NO GROWTH 5 DAYS Performed at [facility], [address], [place], [place] [address]    Report Status 06/22/2022 FINAL  Final  MRSA Next Gen by PCR, Nasal     Status: None   Collection Time: 06/17/22  6:59 PM   Specimen: Nasal Mucosa; Nasal Swab  Result Value Ref Range Status   MRSA by PCR Next Gen NOT DETECTED NOT  DETECTED Final    Comment: (NOTE) The GeneXpert MRSA Assay (FDA approved for NASAL specimens only), is one component of a comprehensive MRSA colonization surveillance program. It is not intended to diagnose MRSA infection nor to guide or monitor treatment for MRSA infections. Test performance is not FDA approved in patients less than 82 years old. Performed at [facility], [address], [place], [place] [address]   Culture,  blood (Routine X 2) w Reflex to ID Panel     Status: None (Preliminary result)   Collection Time: 06/23/22  9:00 AM   Specimen: BLOOD  Result Value Ref Range Status   Specimen Description BLOOD BLOOD LEFT HAND  Final   Special Requests   Final    BOTTLES DRAWN AEROBIC AND ANAEROBIC Blood Culture adequate volume   Culture   Final    NO GROWTH 4 DAYS Performed at [facility], [address] [place], [place] [address]    Report Status PENDING  Incomplete  Culture, blood (Routine X 2) w Reflex to ID Panel     Status: None (Preliminary result)   Collection Time: 06/23/22  9:02 AM   Specimen: BLOOD  Result Value Ref Range Status   Specimen Description BLOOD LFOA  Final   Special Requests   Final    BOTTLES DRAWN AEROBIC AND ANAEROBIC Blood Culture adequate volume   Culture   Final    NO GROWTH 4 DAYS Performed at [facility], [address], [place], [place] [address]    Report Status PENDING  Incomplete  MRSA Next Gen by PCR, Nasal     Status: None   Collection Time: 06/25/22 12:29 PM   Specimen: Nasal Mucosa; Nasal Swab  Result Value Ref Range Status   MRSA by PCR Next Gen NOT DETECTED NOT DETECTED Final    Comment: (NOTE) The GeneXpert MRSA Assay (FDA approved for NASAL specimens only), is one component of a comprehensive MRSA colonization surveillance program. It is not intended to diagnose MRSA infection nor to guide or monitor treatment for MRSA infections. Test performance is not FDA approved in patients less than 54  years old. Performed at [facility], [address] [place],  [address]     Procedures and diagnostic studies:  No results found.   LOS: 10 days   Ewel Lona [employer]  [facility]   Pager on [email or web address]. If 7PM-7AM, please contact night-coverage at [email or web address]   06/27/2022, 10:28 AM

## 2022-06-27 NOTE — Consult Note (Signed)
PHARMACIST - PHYSICIAN COMMUNICATION  CONCERNING:  Enoxaparin (Lovenox) for DVT Prophylaxis    RECOMMENDATION: Patient was prescribed enoxaprin 40mg  q24 hours for VTE prophylaxis.   Filed Weights   06/17/22 0958  Weight: 58.3 kg (128 lb 8.5 oz)    Body mass index is 16.5 kg/m.  Estimated Creatinine Clearance: 29.7 mL/min (A) (by C-G formula based on SCr of 1.5 mg/dL (H)).    Patient is candidate for enoxaparin 30mg  every 24 hours based on CrCl <54ml/min   DESCRIPTION: Pharmacy has adjusted enoxaparin dose per [facility] policy.  Patient is now receiving enoxaparin 30 mg every 24 hours    Oswald Hillock, PharmD Clinical Pharmacist  06/27/2022 2:48 PM

## 2022-06-27 NOTE — Plan of Care (Signed)
  Problem: Education: Goal: Knowledge of disease or condition will improve Outcome: Not Progressing Goal: Knowledge of the prescribed therapeutic regimen will improve Outcome: Not Progressing Goal: Individualized Educational Video(s) Outcome: Not Progressing   Problem: Activity: Goal: Ability to tolerate increased activity will improve Outcome: Not Progressing Goal: Will verbalize the importance of balancing activity with adequate rest periods Outcome: Not Progressing   Problem: Respiratory: Goal: Ability to maintain a clear airway will improve Outcome: Not Progressing Goal: Levels of oxygenation will improve Outcome: Not Progressing Goal: Ability to maintain adequate ventilation will improve Outcome: Not Progressing   Problem: Activity: Goal: Ability to tolerate increased activity will improve Outcome: Not Progressing   Problem: Clinical Measurements: Goal: Ability to maintain a body temperature in the normal range will improve Outcome: Not Progressing   Problem: Respiratory: Goal: Ability to maintain adequate ventilation will improve Outcome: Not Progressing Goal: Ability to maintain a clear airway will improve Outcome: Not Progressing   Problem: Education: Goal: Ability to describe self-care measures that may prevent or decrease complications (Diabetes Survival Skills Education) will improve Outcome: Not Progressing Goal: Individualized Educational Video(s) Outcome: Not Progressing   Problem: Coping: Goal: Ability to adjust to condition or change in health will improve Outcome: Not Progressing   Problem: Fluid Volume: Goal: Ability to maintain a balanced intake and output will improve Outcome: Not Progressing   Problem: Health Behavior/Discharge Planning: Goal: Ability to identify and utilize available resources and services will improve Outcome: Not Progressing Goal: Ability to manage health-related needs will improve Outcome: Not Progressing   Problem:  Metabolic: Goal: Ability to maintain appropriate glucose levels will improve Outcome: Not Progressing   Problem: Nutritional: Goal: Maintenance of adequate nutrition will improve Outcome: Not Progressing Goal: Progress toward achieving an optimal weight will improve Outcome: Not Progressing   Problem: Skin Integrity: Goal: Risk for impaired skin integrity will decrease Outcome: Not Progressing   Problem: Tissue Perfusion: Goal: Adequacy of tissue perfusion will improve Outcome: Not Progressing   Problem: Education: Goal: Knowledge of General Education information will improve Description: Including pain rating scale, medication(s)/side effects and non-pharmacologic comfort measures Outcome: Not Progressing   Problem: Health Behavior/Discharge Planning: Goal: Ability to manage health-related needs will improve Outcome: Not Progressing   Problem: Clinical Measurements: Goal: Ability to maintain clinical measurements within normal limits will improve Outcome: Not Progressing Goal: Will remain free from infection Outcome: Not Progressing Goal: Diagnostic test results will improve Outcome: Not Progressing Goal: Respiratory complications will improve Outcome: Not Progressing Goal: Cardiovascular complication will be avoided Outcome: Not Progressing   Problem: Activity: Goal: Risk for activity intolerance will decrease Outcome: Not Progressing   Problem: Nutrition: Goal: Adequate nutrition will be maintained Outcome: Not Progressing   Problem: Coping: Goal: Level of anxiety will decrease Outcome: Not Progressing   Problem: Elimination: Goal: Will not experience complications related to bowel motility Outcome: Not Progressing Goal: Will not experience complications related to urinary retention Outcome: Not Progressing   Problem: Pain Managment: Goal: General experience of comfort will improve Outcome: Not Progressing   Problem: Safety: Goal: Ability to remain  free from injury will improve Outcome: Not Progressing   Problem: Skin Integrity: Goal: Risk for impaired skin integrity will decrease Outcome: Not Progressing

## 2022-06-27 NOTE — Progress Notes (Signed)
Mobility Specialist - Progress Note   06/27/22 1000  Mobility  Activity Stood at bedside;Transferred from bed to chair;Dangled on edge of bed  Level of Assistance Contact guard assist, steadying assist  Assistive Device Front wheel walker  Distance Ambulated (ft) 2 ft  Activity Response Tolerated fair  $Mobility charge 1 Mobility     Pre-mobility: 88 HR, 97% SpO2   Pt attempting to get OOB on arrival with bed alarm ringing. O2 set on 2L but keeps falling out of nose with sats at 94-97%. Pt restless this AM but lethargic. Fatigues quickly. Pt attempting STS without RW, requiring minA before being assisted back to EOB. Pain in L side that is sensitive to touch. Completes bed mobility with minA. Pt STS a second time with RW and safe technique, but CGA for safety d/t lethargy and increased weakness (pt was given a bath prior to transfer). Several rest breaks taken in between tasks. Pt left in chair with alarm set, needs in reach. Granddaughter at bedside.   Kathee Delton Mobility Specialist 06/27/22, 10:48 AM

## 2022-06-28 DIAGNOSIS — R6520 Severe sepsis without septic shock: Secondary | ICD-10-CM | POA: Diagnosis not present

## 2022-06-28 DIAGNOSIS — J9621 Acute and chronic respiratory failure with hypoxia: Secondary | ICD-10-CM | POA: Diagnosis not present

## 2022-06-28 DIAGNOSIS — A419 Sepsis, unspecified organism: Secondary | ICD-10-CM | POA: Diagnosis not present

## 2022-06-28 LAB — CBC
HCT: 36.8 % — ABNORMAL LOW (ref 39.0–52.0)
Hemoglobin: 11.7 g/dL — ABNORMAL LOW (ref 13.0–17.0)
MCH: 28.3 pg (ref 26.0–34.0)
MCHC: 31.8 g/dL (ref 30.0–36.0)
MCV: 88.9 fL (ref 80.0–100.0)
Platelets: 368 10*3/uL (ref 150–400)
RBC: 4.14 MIL/uL — ABNORMAL LOW (ref 4.22–5.81)
RDW: 16.9 % — ABNORMAL HIGH (ref 11.5–15.5)
WBC: 13.2 10*3/uL — ABNORMAL HIGH (ref 4.0–10.5)
nRBC: 0 % (ref 0.0–0.2)

## 2022-06-28 LAB — CULTURE, BLOOD (ROUTINE X 2)
Culture: NO GROWTH
Culture: NO GROWTH
Special Requests: ADEQUATE
Special Requests: ADEQUATE

## 2022-06-28 LAB — GLUCOSE, CAPILLARY
Glucose-Capillary: 127 mg/dL — ABNORMAL HIGH (ref 70–99)
Glucose-Capillary: 134 mg/dL — ABNORMAL HIGH (ref 70–99)
Glucose-Capillary: 147 mg/dL — ABNORMAL HIGH (ref 70–99)
Glucose-Capillary: 157 mg/dL — ABNORMAL HIGH (ref 70–99)
Glucose-Capillary: 224 mg/dL — ABNORMAL HIGH (ref 70–99)
Glucose-Capillary: 263 mg/dL — ABNORMAL HIGH (ref 70–99)
Glucose-Capillary: 62 mg/dL — ABNORMAL LOW (ref 70–99)

## 2022-06-28 LAB — BASIC METABOLIC PANEL
Anion gap: 11 (ref 5–15)
BUN: 51 mg/dL — ABNORMAL HIGH (ref 8–23)
CO2: 21 mmol/L — ABNORMAL LOW (ref 22–32)
Calcium: 8.4 mg/dL — ABNORMAL LOW (ref 8.9–10.3)
Chloride: 105 mmol/L (ref 98–111)
Creatinine, Ser: 1.33 mg/dL — ABNORMAL HIGH (ref 0.61–1.24)
GFR, Estimated: 52 mL/min — ABNORMAL LOW (ref >60)
Glucose, Bld: 137 mg/dL — ABNORMAL HIGH (ref 70–99)
Potassium: 4.7 mmol/L (ref 3.5–5.1)
Sodium: 137 mmol/L (ref 135–145)

## 2022-06-28 MED ORDER — MORPHINE SULFATE 20 MG/5ML PO SOLN: 2.5000 mg | ORAL | 0 refills | Status: AC | PRN

## 2022-06-28 MED ORDER — LORAZEPAM 2 MG/ML PO CONC: 1.0000 mg | ORAL | 0 refills | Status: AC | PRN

## 2022-06-28 MED ORDER — LORAZEPAM 2 MG/ML PO CONC: 1.0000 mg | ORAL | Status: DC | PRN

## 2022-06-28 MED ORDER — NYSTATIN 100000 UNIT/ML MT SUSP: 5.0000 mL | Freq: Four times a day (QID) | OROMUCOSAL | 0 refills | Status: AC

## 2022-06-28 MED ORDER — DEXTROSE 50 % IV SOLN
12.5000 g | INTRAVENOUS | Status: AC
  Administered 2022-06-28: 12.5 g via INTRAVENOUS
  Filled 2022-06-28: qty 50

## 2022-06-28 MED ORDER — SCOPOLAMINE 1 MG/3DAYS TD PT72: 1.0000 | MEDICATED_PATCH | TRANSDERMAL | 12 refills | Status: AC

## 2022-06-28 MED ORDER — MORPHINE SULFATE (PF) 2 MG/ML IV SOLN
1.0000 mg | INTRAVENOUS | Status: DC | PRN
  Administered 2022-06-29 (×3): 1 mg via INTRAVENOUS
  Filled 2022-06-28 (×3): qty 1

## 2022-06-28 MED ORDER — SCOPOLAMINE 1 MG/3DAYS TD PT72
1.0000 | MEDICATED_PATCH | TRANSDERMAL | Status: DC
  Administered 2022-06-28: 1.5 mg via TRANSDERMAL
  Filled 2022-06-28: qty 1

## 2022-06-28 NOTE — TOC Progression Note (Signed)
Transition of Care [facility]) - Progression Note    Patient Details  Name: [name] MRN: [id number] Date of Birth: 08-May-1935  Transition of Care [facility]) CM/SW [place],  Phone Number: 06/28/2022, 10:05 AM  Clinical Narrative:      CSW spoke with grandaughter [name] to provide snf bed offers, she reports they are moving towards comfort care and interested in home with hospice. CSW informed of hospice options, agreeable to [facility].   CSW has made referral to [facility] to follow up for home hospice services.       Expected Discharge Plan and Services                                               Social Determinants of Health (SDOH) Interventions SDOH Screenings   Food Insecurity: No Food Insecurity (06/19/2022)  Housing: Low Risk  (06/19/2022)  Transportation Needs: No Transportation Needs (06/19/2022)  Utilities: Not At Risk (06/19/2022)  Alcohol Screen: Low Risk  (08/31/2021)  Depression (PHQ2-9): Low Risk  (02/21/2022)  Financial Resource Strain: Low Risk  (08/31/2021)  Physical Activity: Insufficiently Active (08/31/2021)  Social Connections: Moderately Isolated (08/31/2021)  Stress: No Stress Concern Present (08/31/2021)  Tobacco Use: High Risk (06/19/2022)    Readmission Risk Interventions    10/08/2021   12:04 PM  Readmission Risk Prevention Plan  Post Dischage Appt Complete  Medication Screening Complete  Transportation Screening Complete

## 2022-06-28 NOTE — Progress Notes (Signed)
[facility] Hospital Liaison Note  Received request from Transitions of Care Manager, Caryl Pina, for hospice services at home after discharge. Chart and patient information under review by [facility] physician.   Spoke with granddaughter/Amanda to initiate education related to hospice philosophy, services, and team approach to care. [name] verbalized understanding of information given. Per discussion, the plan is for patient to discharge home via [facility] once cleared to DC.    DME needs discussed. Patient has the following equipment in the home Kelly Wheelchair O2 @ 2L Patient requests the following equipment for delivery: Bedside table  Address is incorrect in the chart. Patient will discharge to:  [facility]  [name] is the family member to contact to arrange time of equipment delivery.    Please send signed and completed DNR home with patient/family. Please provide prescriptions at discharge as needed to ensure ongoing symptom management.    [facility] information and contact numbers given to family & above information shared with TOC.   Please call with any questions/concerns.    Thank you for the opportunity to participate in this patient's care.   Daphene Calamity, MSW [facility] Liaison  [phone]

## 2022-06-28 NOTE — Progress Notes (Signed)
OT Cancellation Note  Patient Details Name: [name] MRN: [id number] DOB: 1935/04/22   Cancelled Treatment:    Reason Eval/Treat Not Completed: Other (comment) (pt now on comfort care.) OT discussed continuing vs terminating OT/PT services with family (4 family members in room); family confirmed with patient that he does not want to mobilize or participate in therapy at this time. Pt is now on comfort care. OT informed family that we will d/c therapy services and if pt has an improvement in status they can request PT/OT re-evaluations at any time. Family in agreement. Pt appearing comfortable. Family reporting no needs at this time. OT discussed case with PT.  Vania Rea 06/28/2022, 10:31 AM

## 2022-06-28 NOTE — Progress Notes (Signed)
PT Cancellation Note  Patient Details Name: [name] MRN: [id number] DOB: 03/13/1935   Cancelled Treatment:    Reason Eval/Treat Not Completed: Other (comment). Chart reviewed. Per EMR, plan is for pt to go comfort care with possible home with hospice. PT to sign off. Please re-consult if appropriate.    Salem Caster. Fairly IV, PT, DPT Physical Therapist- [facility]  06/28/2022, 10:51 AM

## 2022-06-28 NOTE — Plan of Care (Signed)
  Problem: Education: Goal: Knowledge of disease or condition will improve Outcome: Not Progressing Goal: Knowledge of the prescribed therapeutic regimen will improve Outcome: Not Progressing Goal: Individualized Educational Video(s) Outcome: Not Progressing   Problem: Activity: Goal: Ability to tolerate increased activity will improve Outcome: Not Progressing Goal: Will verbalize the importance of balancing activity with adequate rest periods Outcome: Not Progressing   Problem: Respiratory: Goal: Ability to maintain a clear airway will improve Outcome: Not Progressing Goal: Levels of oxygenation will improve Outcome: Not Progressing Goal: Ability to maintain adequate ventilation will improve Outcome: Not Progressing   Problem: Activity: Goal: Ability to tolerate increased activity will improve Outcome: Not Progressing   Problem: Clinical Measurements: Goal: Ability to maintain a body temperature in the normal range will improve Outcome: Not Progressing   Problem: Respiratory: Goal: Ability to maintain adequate ventilation will improve Outcome: Not Progressing Goal: Ability to maintain a clear airway will improve Outcome: Not Progressing   Problem: Education: Goal: Ability to describe self-care measures that may prevent or decrease complications (Diabetes Survival Skills Education) will improve Outcome: Not Progressing Goal: Individualized Educational Video(s) Outcome: Not Progressing   Problem: Coping: Goal: Ability to adjust to condition or change in health will improve Outcome: Not Progressing   Problem: Fluid Volume: Goal: Ability to maintain a balanced intake and output will improve Outcome: Not Progressing   Problem: Health Behavior/Discharge Planning: Goal: Ability to identify and utilize available resources and services will improve Outcome: Not Progressing Goal: Ability to manage health-related needs will improve Outcome: Not Progressing   Problem:  Metabolic: Goal: Ability to maintain appropriate glucose levels will improve Outcome: Not Progressing   Problem: Nutritional: Goal: Maintenance of adequate nutrition will improve Outcome: Not Progressing Goal: Progress toward achieving an optimal weight will improve Outcome: Not Progressing   Problem: Skin Integrity: Goal: Risk for impaired skin integrity will decrease Outcome: Not Progressing   Problem: Tissue Perfusion: Goal: Adequacy of tissue perfusion will improve Outcome: Not Progressing   Problem: Education: Goal: Knowledge of General Education information will improve Description: Including pain rating scale, medication(s)/side effects and non-pharmacologic comfort measures Outcome: Not Progressing   Problem: Health Behavior/Discharge Planning: Goal: Ability to manage health-related needs will improve Outcome: Not Progressing   Problem: Clinical Measurements: Goal: Ability to maintain clinical measurements within normal limits will improve Outcome: Not Progressing Goal: Will remain free from infection Outcome: Not Progressing Goal: Diagnostic test results will improve Outcome: Not Progressing Goal: Respiratory complications will improve Outcome: Not Progressing Goal: Cardiovascular complication will be avoided Outcome: Not Progressing   Problem: Activity: Goal: Risk for activity intolerance will decrease Outcome: Not Progressing   Problem: Nutrition: Goal: Adequate nutrition will be maintained Outcome: Not Progressing   Problem: Coping: Goal: Level of anxiety will decrease Outcome: Not Progressing   Problem: Elimination: Goal: Will not experience complications related to bowel motility Outcome: Not Progressing Goal: Will not experience complications related to urinary retention Outcome: Not Progressing   Problem: Pain Managment: Goal: General experience of comfort will improve Outcome: Not Progressing   Problem: Safety: Goal: Ability to remain  free from injury will improve Outcome: Not Progressing   Problem: Skin Integrity: Goal: Risk for impaired skin integrity will decrease Outcome: Not Progressing

## 2022-06-28 NOTE — Progress Notes (Signed)
Progress Note   [name]  ZOX:[id number] DOB: 02/28/1935  DOA: 06/17/2022 PCP: Susy Frizzle, MD   Brief Narrative:   Medical records reviewed and are as summarized below:  [name] is a 88 y.o. male with a PMH of Chronic Hypoxic Respiratory Failure on 2L [place] at night, COPD, HTN, HL, DM2, CAD s/p CABG, Chronic Diastolic Heart Failure, severe Aortic Stenosis, PAF  s/p ablation and history of DVT (on Eliquis), CKD3, BPH, and severe protein calorie malnutrition who presented to the ED with acute dyspnea and acute hypoxia due to multifocal pneumonia and acute COPD exacerbation.  Patient was treated with antibiotics and steroids.  Of note patient was admitted on 12/18 for pneumonia.  He initially improved but feels a little tired and more short of breath the last couple days.   Chest CT shows no evidence of a PE. Has worked with PT today, is on his baseline O2 but much weaker and they are recommending Home with family and HH vs SNF. Today family has decided on home with hospice, and transition to comfort care.  Assessment/Plan:   Principal Problem:   Bilateral pneumonia Active Problems:   COPD exacerbation (HCC)   Acute and chronic respiratory failure with hypoxia (HCC)   Severe sepsis (HCC)   Essential hypertension   Chronic kidney disease, stage 3a (HCC)   Type II diabetes mellitus with renal manifestations (HCC)   Back pain   BPH (benign prostatic hyperplasia)   Paroxysmal atrial fibrillation (HCC)   Cerebrovascular disease   Cigarette smoker   Depression with anxiety   Protein-calorie malnutrition, severe   Body mass index is 16.5 kg/m.  (Underweight)  Patient and family have decided to pursue comfort care as of today 1/5, they are anticipating home with hospice. Relayed to RN, placed comfort orders and discussed with TOC as well.   Code Status: DNR/DNI  Diet Order             Diet regular Room service appropriate? Yes; Fluid consistency: Thin  Diet  effective now                  Consultants: None  Procedures: None  Medications:    Chlorhexidine Gluconate Cloth  6 each Topical Daily   feeding supplement  237 mL Oral TID BM   insulin aspart  0-15 Units Subcutaneous TID WC   insulin aspart  0-5 Units Subcutaneous QHS   insulin aspart protamine- aspart  12 Units Subcutaneous Q breakfast   insulin aspart protamine- aspart  18 Units Subcutaneous Q supper   nystatin  5 mL Oral QID   scopolamine  1 patch Transdermal Q72H   senna-docusate  1 tablet Oral BID   tamsulosin  0.4 mg Oral Daily   Continuous Infusions:   Anti-infectives (From admission, onward)    Start     Dose/Rate Route Frequency Ordered Stop   06/24/22 1300  vancomycin (VANCOREADY) IVPB 750 mg/150 mL  Status:  Discontinued        750 mg 150 mL/hr over 60 Minutes Intravenous Every 24 hours 06/24/22 1221 06/26/22 0905   06/24/22 1000  ceFEPIme (MAXIPIME) 2 g in sodium chloride 0.9 % 100 mL IVPB        2 g 200 mL/hr over 30 Minutes Intravenous Every 12 hours 06/24/22 0954 06/26/22 2231   06/23/22 2200  ceFEPIme (MAXIPIME) 2 g in sodium chloride 0.9 % 100 mL IVPB        2 g 200 mL/hr  over 30 Minutes Intravenous Every 24 hours 06/23/22 0845 06/23/22 2215   06/23/22 1000  vancomycin (VANCOREADY) IVPB 1250 mg/250 mL        1,250 mg 166.7 mL/hr over 90 Minutes Intravenous  Once 06/23/22 0844 06/23/22 1134   06/23/22 0930  vancomycin (VANCOCIN) IVPB 1000 mg/200 mL premix  Status:  Discontinued        1,000 mg 200 mL/hr over 60 Minutes Intravenous Every 12 hours 06/23/22 0830 06/23/22 0841   06/23/22 0843  vancomycin variable dose per unstable renal function (pharmacist dosing)  Status:  Discontinued         Does not apply See admin instructions 06/23/22 0844 06/25/22 0951   06/22/22 1800  doxycycline (VIBRA-TABS) tablet 100 mg  Status:  Discontinued        100 mg Oral Every 12 hours 06/22/22 1751 06/23/22 0829   06/18/22 1200  vancomycin (VANCOCIN) 750 mg in  sodium chloride 0.9 % 250 mL IVPB  Status:  Discontinued        750 mg 250 mL/hr over 60 Minutes Intravenous Every 24 hours 06/17/22 1628 06/18/22 0533   06/18/22 1200  vancomycin (VANCOREADY) IVPB 750 mg/150 mL  Status:  Discontinued        750 mg 150 mL/hr over 60 Minutes Intravenous Every 24 hours 06/18/22 0533 06/18/22 1125   06/17/22 2200  ceFEPIme (MAXIPIME) 2 g in sodium chloride 0.9 % 100 mL IVPB  Status:  Discontinued        2 g 200 mL/hr over 30 Minutes Intravenous Every 12 hours 06/17/22 1628 06/23/22 0845   06/17/22 1600  metroNIDAZOLE (FLAGYL) IVPB 500 mg        500 mg 100 mL/hr over 60 Minutes Intravenous Every 12 hours 06/17/22 1553 06/23/22 1615   06/17/22 1200  vancomycin (VANCOCIN) IVPB 1000 mg/200 mL premix        1,000 mg 200 mL/hr over 60 Minutes Intravenous  Once 06/17/22 1154 06/17/22 1448   06/17/22 1200  ceFEPIme (MAXIPIME) 2 g in sodium chloride 0.9 % 100 mL IVPB        2 g 200 mL/hr over 30 Minutes Intravenous  Once 06/17/22 1154 06/17/22 1330      Family Communication/Anticipated D/C date and plan/Code Status   DVT prophylaxis:     Code Status: DNR  Family Communication: [name], grand daughter over the phone this PM.  Disposition Plan:  Discussed with [name] in person this AM, plan for SNF DC.  Status is: Inpatient Remains inpatient appropriate because: pneumonia and acute hypoxia  Subjective:   Mr. Kenner symptoms are stable today but he is very weak. Seen with [name] and other family at the bedside.  Objective:    Vitals:   06/27/22 2122 06/27/22 2358 06/28/22 0326 06/28/22 0932  BP:  (!) 126/45 (!) 136/58 (!) 111/50  Pulse:  80 71 81  Resp:  16 20   Temp:  97.6 F (36.4 C)  98.3 F (36.8 C)  TempSrc:  Oral    SpO2: 96% 96% 99% 98%  Weight:      Height:       No data found.   Intake/Output Summary (Last 24 hours) at 06/28/2022 1111 Last data filed at 06/28/2022 1037 Gross per 24 hour  Intake 30 ml  Output 850 ml  Net -820 ml     Filed Weights   06/17/22 0958  Weight: 58.3 kg    Exam: General:  Alert, looks uncomfortable and very tired Eyes: EOMI, clear conjuctivae,  white sclerea Neck: supple, no masses, trachea mildline  Cardiovascular: RRR, no murmurs or rubs, no peripheral edema  Respiratory: clear to auscultation bilaterally, no wheezes, no crackles  Abdomen: soft, nontender, nondistended, normal bowel tones heard  Skin: dry, no rashes  Musculoskeletal: no joint effusions, normal range of motion  Psychiatric: appropriate affect, normal speech  Neurologic: extraocular muscles intact, no speech, moving all extremities with intact sensorium    Pressure Injury 06/11/22 Sacrum Mid Stage 2 -  Partial thickness loss of dermis presenting as a shallow open injury with a red, pink wound bed without slough. (Active)  06/11/22 2300  Location: Sacrum  Location Orientation: Mid  Staging: Stage 2 -  Partial thickness loss of dermis presenting as a shallow open injury with a red, pink wound bed without slough.  Wound Description (Comments):   Present on Admission: Yes  Dressing Type Foam - Lift dressing to assess site every shift 06/28/22 0730   Data Reviewed:   I have personally reviewed following labs and imaging studies:  Labs: Labs show the following:   Basic Metabolic Panel: Recent Labs  Lab 06/22/22 0537 06/23/22 0554 06/24/22 0508 06/25/22 0603 06/27/22 0555 06/28/22 0724  NA 135 138 138  --  138 137  K 3.9 4.1 3.8  --  4.8 4.7  CL 98 99 100  --  103 105  CO2 29 28 28   --  20* 21*  GLUCOSE 301* 232* 104*  --  173* 137*  BUN 51* 59* 53*  --  49* 51*  CREATININE 1.26* 1.55* 1.22 1.24 1.50* 1.33*  CALCIUM 8.3* 8.3* 8.2*  --  8.5* 8.4*  MG 1.9  --   --   --   --   --     GFR Estimated Creatinine Clearance: 33.5 mL/min (A) (by C-G formula based on SCr of 1.33 mg/dL (H)). Liver Function Tests: Recent Labs  Lab 06/24/22 0508  AST 18  ALT 15  ALKPHOS 57  BILITOT 0.9  PROT 6.9  ALBUMIN  2.5*   CBC: Recent Labs  Lab 06/22/22 0537 06/23/22 0554 06/24/22 0508 06/27/22 0555 06/28/22 0724  WBC 16.8* 22.2* 22.6* 14.9* 13.2*  NEUTROABS 13.4*  --   --   --   --   HGB 11.7* 11.3* 11.7* 11.2* 11.7*  HCT 35.4* 34.1* 36.2* 35.4* 36.8*  MCV 86.1 86.5 87.4 89.8 88.9  PLT 377 403* 409* 370 368     CBG: Recent Labs  Lab 06/27/22 1647 06/27/22 2003 06/27/22 2358 06/28/22 0329 06/28/22 0934  GLUCAP 190* 155* 134* 127* 157*   Sepsis Labs: Recent Labs  Lab 06/23/22 0554 06/24/22 0508 06/27/22 0555 06/28/22 0724  WBC 22.2* 22.6* 14.9* 13.2*   Microbiology Recent Results (from the past 240 hour(s))  Culture, blood (Routine X 2) w Reflex to ID Panel     Status: None   Collection Time: 06/23/22  9:00 AM   Specimen: BLOOD  Result Value Ref Range Status   Specimen Description BLOOD BLOOD LEFT HAND  Final   Special Requests   Final    BOTTLES DRAWN AEROBIC AND ANAEROBIC Blood Culture adequate volume   Culture   Final    NO GROWTH 5 DAYS Performed at [facility], [address], [place], [place] [address]    Report Status 06/28/2022 FINAL  Final  Culture, blood (Routine X 2) w Reflex to ID Panel     Status: None   Collection Time: 06/23/22  9:02 AM   Specimen: BLOOD  Result Value Ref Range Status   Specimen Description BLOOD LFOA  Final   Special Requests   Final    BOTTLES DRAWN AEROBIC AND ANAEROBIC Blood Culture adequate volume   Culture   Final    NO GROWTH 5 DAYS Performed at [facility], [address] [place], [place] [address]    Report Status 06/28/2022 FINAL  Final  MRSA Next Gen by PCR, Nasal     Status: None   Collection Time: 06/25/22 12:29 PM   Specimen: Nasal Mucosa; Nasal Swab  Result Value Ref Range Status   MRSA by PCR Next Gen NOT DETECTED NOT DETECTED Final    Comment: (NOTE) The GeneXpert MRSA Assay (FDA approved for NASAL specimens only), is one component of a comprehensive MRSA colonization  surveillance program. It is not intended to diagnose MRSA infection nor to guide or monitor treatment for MRSA infections. Test performance is not FDA approved in patients less than 2 years old. Performed at [facility], [address] [place], [place] [address]     Procedures and diagnostic studies:  No results found.   LOS: 11 days   Phil Corti [employer]  [facility]   Pager on [email or web address]. If 7PM-7AM, please contact night-coverage at [email or web address]   06/28/2022, 11:11 AM

## 2022-06-29 DIAGNOSIS — J441 Chronic obstructive pulmonary disease with (acute) exacerbation: Secondary | ICD-10-CM | POA: Diagnosis not present

## 2022-06-29 DIAGNOSIS — Z7401 Bed confinement status: Secondary | ICD-10-CM | POA: Diagnosis not present

## 2022-06-29 DIAGNOSIS — R0902 Hypoxemia: Secondary | ICD-10-CM | POA: Diagnosis not present

## 2022-06-29 DIAGNOSIS — J690 Pneumonitis due to inhalation of food and vomit: Secondary | ICD-10-CM | POA: Diagnosis not present

## 2022-06-29 DIAGNOSIS — I10 Essential (primary) hypertension: Secondary | ICD-10-CM | POA: Diagnosis not present

## 2022-06-29 DIAGNOSIS — Z743 Need for continuous supervision: Secondary | ICD-10-CM | POA: Diagnosis not present

## 2022-06-29 LAB — GLUCOSE, CAPILLARY
Glucose-Capillary: 101 mg/dL — ABNORMAL HIGH (ref 70–99)
Glucose-Capillary: 116 mg/dL — ABNORMAL HIGH (ref 70–99)
Glucose-Capillary: 117 mg/dL — ABNORMAL HIGH (ref 70–99)
Glucose-Capillary: 142 mg/dL — ABNORMAL HIGH (ref 70–99)
Glucose-Capillary: 56 mg/dL — ABNORMAL LOW (ref 70–99)

## 2022-06-29 MED ORDER — DEXTROSE 50 % IV SOLN
12.5000 g | INTRAVENOUS | Status: AC
  Administered 2022-06-29: 12.5 g via INTRAVENOUS
  Filled 2022-06-29: qty 50

## 2022-06-29 NOTE — Discharge Summary (Signed)
Discharge Summary  [name]:[id number] DOB: 1935-05-15  PCP: Susy Frizzle, MD  Admit date: 06/17/2022 Discharge date: 06/29/2022  Recommendations for Outpatient Follow-up:  Per hospice provider.  Discharge Diagnoses:  Active Hospital Problems   Diagnosis Date Noted   Bilateral pneumonia 06/17/2022    Priority: High   COPD exacerbation ([place]) 06/17/2022    Priority: Low   Severe sepsis ([place]) 06/17/2022    Priority: 1.   Acute and chronic respiratory failure with hypoxia ([place]) 10/03/2021    Priority: 1.   Essential hypertension 11/19/2008    Priority: 3.   Chronic kidney disease, stage 3a ([place]) 06/17/2022    Priority: 5.   Type II diabetes mellitus with renal manifestations ([place]) 06/17/2022    Priority: 6.   Back pain 06/10/2022    Priority: 7.   BPH (benign prostatic hyperplasia) 06/10/2022    Priority: 8.   Paroxysmal atrial fibrillation ([place]) 06/17/2022    Priority: 9.   Cerebrovascular disease 11/14/2011    Priority: 12.   Cigarette smoker 01/11/2010    Priority: 13.   Depression with anxiety 06/17/2022    Priority: 14.   Protein-calorie malnutrition, severe 10/05/2021    Priority: 15.    Resolved Hospital Problems  No resolved problems to display.   Discharge Condition: Stable   Diet recommendation: Diet Orders (From admission, onward)     Start     Ordered   06/20/22 1435  Diet regular Room service appropriate? Yes; Fluid consistency: Thin  Diet effective now       Question Answer Comment  Room service appropriate? Yes   Fluid consistency: Thin      06/20/22 1435           HPI and Brief Hospital Course:  [name] is a 88 y.o. male with a PMH of Chronic Hypoxic Respiratory Failure on 2L [place] at night, COPD, HTN, HL, DM2, CAD s/p CABG, Chronic Diastolic Heart Failure, severe Aortic Stenosis, PAF  s/p ablation and history of DVT (on Eliquis), CKD3, BPH, and severe protein calorie malnutrition who presented to the ED with acute  dyspnea and acute hypoxia due to multifocal pneumonia and acute COPD exacerbation.  Patient was treated with antibiotics and steroids.  Of note patient was admitted on 12/18 for pneumonia.   He initially improved but feels a little tired and more short of breath the last couple days, has been incredibly weak.  Chest CT shows no evidence of a PE. Has worked with PT, is on his baseline O2 but much weaker and they are recommending Home with family and HH vs SNF.  On 1/5, I had extensive discussion with family who has decided on home with hospice, and transition to comfort care.  Orders were changed on 1/5 to comfort care, patient has remained stable and is ready for discharge home with hospice today.  Discharge details, plan of care and follow up instructions were discussed with available family or care providers. Patient and family are in agreement with discharge from the hospital today and all questions were answered to their satisfaction.  Discharge Exam: BP (!) 91/42 (BP Location: Right Leg)   Pulse 84   Temp 98.6 F (37 C) (Axillary)   Resp 20   Ht 6\' 2"  (1.88 m)   Wt 58.3 kg   SpO2 98%   BMI 16.50 kg/m  General: Emaciated, thin elderly gentleman who looks slightly uncomfortable but not toxic Eyes: EOMI, clear sclerea Neck: supple, no masses, trachea mildline  Cardiovascular: RRR, no murmurs or rubs, no peripheral edema  Respiratory: clear to auscultation bilaterally, no wheezes, no crackles  Abdomen: soft, nontender, nondistended, normal bowel tones heard  Skin: dry, no rashes  Musculoskeletal: no joint effusions, normal range of motion  Psychiatric: appropriate affect, slurred speech  Neurologic: extraocular muscles intact, slurred speech, moving all extremities with intact sensorium   Discharge Instructions You were cared for by a hospitalist during your hospital stay. If you have any questions about your discharge medications or the care you received while you were in the hospital  after you are discharged, you can call the unit and asked to speak with the hospitalist on call if the hospitalist that took care of you is not available. Once you are discharged, your primary care physician will handle any further medical issues. Please note that NO REFILLS for any discharge medications will be authorized once you are discharged, as it is imperative that you return to your primary care physician (or establish a relationship with a primary care physician if you do not have one) for your aftercare needs so that they can reassess your need for medications and monitor your lab values.   Allergies as of 06/29/2022       Reactions   Celebrex [celecoxib] Rash, Other (See Comments)   Blisters, also   Tape Other (See Comments)   EKG leads leave red marks and PLEASE USE COBAN WRAP!! Skin tears and bruises easily        Medication List     STOP taking these medications    albuterol (2.5 MG/3ML) 0.083% nebulizer solution Commonly known as: PROVENTIL   aspirin 325 MG tablet   budesonide-formoterol 160-4.5 MCG/ACT inhaler Commonly known as: Symbicort   clobetasol cream 0.05 % Commonly known as: TEMOVATE   clotrimazole 1 % cream Commonly known as: LOTRIMIN   HYDROcodone-acetaminophen 5-325 MG tablet Commonly known as: Norco   nitroGLYCERIN 0.4 MG SL tablet Commonly known as: NITROSTAT   pravastatin 40 MG tablet Commonly known as: PRAVACHOL   triamcinolone cream 0.1 % Commonly known as: KENALOG       TAKE these medications    acetaminophen 325 MG tablet Commonly known as: TYLENOL Take 325-650 mg by mouth every 6 (six) hours as needed for mild pain or headache.   blood glucose meter kit and supplies Dispense based on patient and insurance preference. Use up to four times daily as directed. (FOR ICD-10 E10.9, E11.9).   guaiFENesin 100 MG/5ML liquid Commonly known as: ROBITUSSIN Take 10 mLs by mouth every 6 (six) hours as needed for cough or to loosen phlegm.    hydrOXYzine 25 MG tablet Commonly known as: ATARAX Take 1 tablet (25 mg total) by mouth 3 (three) times daily as needed for itching.   Insulin Syringe-Needle U-100 31G X 5/16" 0.3 ML Misc Commonly known as: BD Insulin Syringe U/F USE WITH NOVOLIN TWICE DAY WITH A MEAL   LORazepam 2 MG/ML concentrated solution Commonly known as: ATIVAN Take 0.5 mLs (1 mg total) by mouth every 4 (four) hours as needed for anxiety.   morphine 20 MG/5ML solution Take 0.6 mLs (2.4 mg total) by mouth every 2 (two) hours as needed for up to 3 days for pain.   NovoLIN 70/30 (70-30) 100 UNIT/ML injection Generic drug: insulin NPH-regular Human INJECT 8 UNITS SQ IN THE MORNING AND INJECT 12 UNITS SQ AT BEDTIME What changed: See the new instructions.   nystatin 100000 UNIT/ML suspension Commonly known as: MYCOSTATIN Take 5 mLs (500,000  Units total) by mouth 4 (four) times daily.   OneTouch Delica Lancets 56O Misc USE TO CHECK BLOOD SUAGR 3 TIMES A DAY DX:E11.9   OneTouch Ultra test strip Generic drug: glucose blood USE TO CHECK BLOOD SUGAR 4 TO 6 TIMES DAILY. E11.9   OXYGEN Inhale 4 L/min into the lungs at bedtime.   pantoprazole 40 MG tablet Commonly known as: PROTONIX TAKE 1 TABLET BY MOUTH EVERY DAY   scopolamine 1 MG/3DAYS Commonly known as: TRANSDERM-SCOP Place 1 patch (1.5 mg total) onto the skin every 3 (three) days. Start taking on: July 01, 2022   tamsulosin 0.4 MG Caps capsule Commonly known as: FLOMAX TAKE 1 CAPSULE BY MOUTH EVERY DAY       Allergies  Allergen Reactions   Celebrex [Celecoxib] Rash and Other (See Comments)    Blisters, also   Tape Other (See Comments)    EKG leads leave red marks and PLEASE USE COBAN WRAP!! Skin tears and bruises easily     The results of significant diagnostics from this hospitalization (including imaging, microbiology, ancillary and laboratory) are listed below for reference.    Significant Diagnostic Studies: CT Angio Chest  Pulmonary Embolism (PE) W or WO Contrast  Result Date: 06/22/2022 CLINICAL DATA:  Chronic dyspnea, pulmonary embolism suspected, hypoxia EXAM: CT ANGIOGRAPHY CHEST WITH CONTRAST TECHNIQUE: Multidetector CT imaging of the chest was performed using the standard protocol during bolus administration of intravenous contrast. Multiplanar CT image reconstructions and MIPs were obtained to evaluate the vascular anatomy. RADIATION DOSE REDUCTION: This exam was performed according to the departmental dose-optimization program which includes automated exposure control, adjustment of the mA and/or kV according to patient size and/or use of iterative reconstruction technique. CONTRAST:  52mL OMNIPAQUE IOHEXOL 350 MG/ML SOLN COMPARISON:  CT done on 06/17/2022, chest radiographs done on 06/20/2022 FINDINGS: Cardiovascular: There are no intraluminal filling defects in pulmonary artery branches. Coronary artery calcifications are seen. There is previous coronary bypass surgery. Contrast density in thoracic aorta is less than optimal to evaluate the lumen. There are atherosclerotic plaques and calcifications in thoracic aorta. Mediastinum/Nodes: No interval changes are noted in slightly enlarged lymph nodes in mediastinum and hilar regions. Lungs/Pleura: Centrilobular and panlobular emphysema is seen. Blebs and bullae are seen in both apices. There are linear densities in both apices suggesting scarring. There is possible ectatic bronchus in the anterior aspect of right upper lung field with no significant change. Small patchy infiltrate is seen in right middle lobe with slight improvement. There is interval decrease in patchy infiltrates in right lower lobe. New infiltrate is seen in lingula. Large new infiltrates are noted in left lower lobe. There is interval appearance of small left pleural effusion. There is fluid density in the lumen of left lower lobe bronchi. There is no pneumothorax. Upper Abdomen: Left renal pelvis is  prominent. There is prominence of minor calices in the upper poles of both kidneys. Kidneys are not included in their entirety limiting evaluation. There is 5 mm calcific density in the upper pole of right kidney. There is 2 mm calcific density in the upper pole of left kidney. Musculoskeletal: No acute findings are seen in bony structures. Review of the MIP images confirms the above findings. IMPRESSION: There is no evidence of pulmonary embolism. Coronary artery disease. Aortic atherosclerosis. COPD. There are patchy infiltrates in both lungs with slight improvement in the infiltrate in right lower lobe and marked interval worsening of infiltrates in left lower lobe. New small patchy infiltrate is seen in  lingula. Findings suggest multifocal pneumonia with interval worsening in the left lung. There is interval appearance of small left pleural effusion. Mucous secretions are noted in the lumen of left lower lobe bronchi. Bilateral renal stones. There is prominence collecting systems in the kidneys in the visualized upper poles of both kidneys. This finding is not fully evaluated. If there is clinical suspicion for ureteric obstruction, follow-up CT abdomen and pelvis may be considered. Electronically Signed   By: Elmer Picker M.D.   On: 06/22/2022 13:02   ECHOCARDIOGRAM COMPLETE  Result Date: 06/21/2022    ECHOCARDIOGRAM REPORT   Patient Name:   [name] Date of Exam: 06/21/2022 Medical Rec #:  [id number]        Height:       74.0 in Accession #:    [id number]       Weight:       128.5 lb Date of Birth:  [birth year]        BSA:          1.801 m Patient Age:    37 years         BP:           125/56 mmHg Patient Gender: M                HR:           80 bpm. Exam Location:  [facility] Procedure: 2D Echo, Cardiac Doppler and Color Doppler Indications:     Dyspnea  History:         Patient has no prior history of Echocardiogram examinations.                  CAD, COPD, Arrythmias:Atrial Fibrillation,                   Signs/Symptoms:Fatigue; Risk Factors:Hypertension, Current                  Smoker, Diabetes and Dyslipidemia. BMI <19.  Sonographer:     Wenda Low Referring Phys:  [id number] Jennye Boroughs Diagnosing Phys: Kathlyn Sacramento MD  Sonographer Comments: Image acquisition challenging due to patient body habitus. IMPRESSIONS  1. Left ventricular ejection fraction, by estimation, is 60 to 65%. The left ventricle has normal function. The left ventricle has no regional wall motion abnormalities. There is mild left ventricular hypertrophy. Left ventricular diastolic parameters were normal.  2. Right ventricular systolic function is normal. The right ventricular size is normal. There is normal pulmonary artery systolic pressure. The estimated right ventricular systolic pressure is 13.0 mmHg.  3. The mitral valve is normal in structure. Mild mitral valve regurgitation. No evidence of mitral stenosis.  4. The aortic valve is normal in structure. Aortic valve regurgitation is not visualized. Aortic valve sclerosis/calcification is present, without any evidence of aortic stenosis.  5. Aortic dilatation noted. There is borderline dilatation of the aortic root, measuring 39 mm.  6. The inferior vena cava is normal in size with greater than 50% respiratory variability, suggesting right atrial pressure of 3 mmHg. FINDINGS  Left Ventricle: Left ventricular ejection fraction, by estimation, is 60 to 65%. The left ventricle has normal function. The left ventricle has no regional wall motion abnormalities. The left ventricular internal cavity size was normal in size. There is  mild left ventricular hypertrophy. Left ventricular diastolic parameters were normal. Right Ventricle: The right ventricular size is normal. No increase in right ventricular wall thickness. Right ventricular systolic function is normal. There is normal  pulmonary artery systolic pressure. The tricuspid regurgitant velocity is 2.76 m/s, and  with an assumed  right atrial pressure of 3 mmHg, the estimated right ventricular systolic pressure is 20.2 mmHg. Left Atrium: Left atrial size was normal in size. Right Atrium: Right atrial size was normal in size. Pericardium: There is no evidence of pericardial effusion. Mitral Valve: The mitral valve is normal in structure. Mild mitral valve regurgitation. No evidence of mitral valve stenosis. MV peak gradient, 2.4 mmHg. The mean mitral valve gradient is 1.0 mmHg. Tricuspid Valve: The tricuspid valve is normal in structure. Tricuspid valve regurgitation is not demonstrated. No evidence of tricuspid stenosis. Aortic Valve: The aortic valve is normal in structure. Aortic valve regurgitation is not visualized. Aortic valve sclerosis/calcification is present, without any evidence of aortic stenosis. Aortic valve mean gradient measures 2.0 mmHg. Aortic valve peak  gradient measures 4.0 mmHg. Aortic valve area, by VTI measures 3.16 cm. Pulmonic Valve: The pulmonic valve was normal in structure. Pulmonic valve regurgitation is not visualized. No evidence of pulmonic stenosis. Aorta: Aortic dilatation noted. There is borderline dilatation of the aortic root, measuring 39 mm. Venous: The inferior vena cava is normal in size with greater than 50% respiratory variability, suggesting right atrial pressure of 3 mmHg. IAS/Shunts: No atrial level shunt detected by color flow Doppler.  LEFT VENTRICLE PLAX 2D LVIDd:         3.90 cm   Diastology LVIDs:         2.30 cm   LV e' medial:    8.27 cm/s LV PW:         0.90 cm   LV E/e' medial:  8.1 LV IVS:        1.30 cm   LV e' lateral:   9.03 cm/s LVOT diam:     2.10 cm   LV E/e' lateral: 7.4 LV SV:         70 LV SV Index:   39 LVOT Area:     3.46 cm  RIGHT VENTRICLE RV Basal diam:  3.35 cm RV Mid diam:    3.00 cm RV S prime:     12.60 cm/s LEFT ATRIUM           Index        RIGHT ATRIUM           Index LA diam:      3.10 cm 1.72 cm/m   RA Area:     14.80 cm LA Vol (A2C): 25.1 ml 13.94 ml/m  RA  Volume:   38.30 ml  21.26 ml/m LA Vol (A4C): 24.0 ml 13.33 ml/m  AORTIC VALVE                    PULMONIC VALVE AV Area (Vmax):    3.53 cm     PV Vmax:       1.12 m/s AV Area (Vmean):   3.20 cm     PV Peak grad:  5.0 mmHg AV Area (VTI):     3.16 cm AV Vmax:           100.00 cm/s AV Vmean:          69.400 cm/s AV VTI:            0.220 m AV Peak Grad:      4.0 mmHg AV Mean Grad:      2.0 mmHg LVOT Vmax:         102.00 cm/s LVOT Vmean:  64.200 cm/s LVOT VTI:          0.201 m LVOT/AV VTI ratio: 0.91  AORTA Ao Root diam: 3.90 cm MITRAL VALVE               TRICUSPID VALVE MV Area (PHT): 2.60 cm    TR Peak grad:   30.5 mmHg MV Area VTI:   3.48 cm    TR Vmax:        276.00 cm/s MV Peak grad:  2.4 mmHg MV Mean grad:  1.0 mmHg    SHUNTS MV Vmax:       0.77 m/s    Systemic VTI:  0.20 m MV Vmean:      44.9 cm/s   Systemic Diam: 2.10 cm MV Decel Time: 292 msec MV E velocity: 67.10 cm/s MV A velocity: 79.70 cm/s MV E/A ratio:  0.84 Kathlyn Sacramento MD Electronically signed by Kathlyn Sacramento MD Signature Date/Time: 06/21/2022/11:59:49 AM    Final    DG Chest Port 1 View  Result Date: 06/20/2022 CLINICAL DATA:  Dyspnea, chest pain EXAM: PORTABLE CHEST 1 VIEW COMPARISON:  06/17/2022 FINDINGS: The lungs appear hyperinflated in keeping with changes of underlying COPD. Superimposed extensive interstitial and airspace infiltrates have progressed since prior examination in keeping with multifocal pneumonic infiltrate, more prevalent within the right lung base. Small left pleural effusion has developed. Coronary artery bypass grafting has been performed. Cardiac size within normal limits. No acute bone abnormality. IMPRESSION: 1. Progressive, extensive multifocal pneumonic infiltrate, more prevalent within the right lung base. Small left pleural effusion. 2. COPD. Electronically Signed   By: Fidela Salisbury M.D.   On: 06/20/2022 00:31   CT T-SPINE NO CHARGE  Result Date: 06/17/2022 CLINICAL DATA:  Initial evaluation  for acute trauma, back pain. EXAM: CT THORACIC SPINE WITHOUT CONTRAST TECHNIQUE: Multidetector CT images of the thoracic were obtained using the standard protocol without intravenous contrast. RADIATION DOSE REDUCTION: This exam was performed according to the departmental dose-optimization program which includes automated exposure control, adjustment of the mA and/or kV according to patient size and/or use of iterative reconstruction technique. COMPARISON:  Concomitant CT of the chest. FINDINGS: Alignment: Physiologic with preservation of the normal thoracic kyphosis. No listhesis. Vertebrae: Vertebral body height maintained without acute or chronic fracture. Visualized ribs intact. No worrisome osseous lesions. Paraspinal and other soft tissues: Paraspinous soft tissues demonstrate no acute finding. Right lower lobe consolidative opacity, which could reflect changes of infectious or aspiration pneumonitis. Aortic atherosclerosis. Emphysema noted. Disc levels: No significant disc pathology or stenosis evident by CT. Mild for age multilevel degenerative endplate spurring. IMPRESSION: 1. No CT evidence for acute osseous abnormality within the thoracic spine. 2. Right lower lobe consolidative opacity, which could reflect changes of infectious or aspiration pneumonitis. Aortic Atherosclerosis (ICD10-I70.0) and Emphysema (ICD10-J43.9). Electronically Signed   By: Jeannine Boga M.D.   On: 06/17/2022 17:59   CT Lumbar Spine Wo Contrast  Result Date: 06/17/2022 CLINICAL DATA:  Initial evaluation for acute trauma. EXAM: CT LUMBAR SPINE WITHOUT CONTRAST TECHNIQUE: Multidetector CT imaging of the lumbar spine was performed without intravenous contrast administration. Multiplanar CT image reconstructions were also generated. RADIATION DOSE REDUCTION: This exam was performed according to the departmental dose-optimization program which includes automated exposure control, adjustment of the mA and/or kV according to  patient size and/or use of iterative reconstruction technique. COMPARISON:  None Available. FINDINGS: Segmentation: Standard. Lowest well-formed disc space labeled the L5-S1 level. Alignment: Trace degenerative anterolisthesis of L4 on L5. Alignment otherwise normal  preservation of the normal lumbar lordosis. Vertebrae: Vertebral body height well maintained without acute or chronic fracture. Visualized sacrum and pelvis intact. SI joints symmetric and normal. No discrete or worrisome osseous lesions. Paraspinal and other soft tissues: Paraspinous soft tissues demonstrate no acute finding. Bilateral nonobstructive nephrolithiasis. Advanced aorto bi-iliac atherosclerotic disease noted. Disc levels: Mild for age noncompressive disc bulging noted within the lumbar spine. Mild-to-moderate multilevel facet hypertrophy. No significant spinal stenosis by CT. IMPRESSION: 1. No acute osseous abnormality within the lumbar spine spine. 2. Bilateral nonobstructive nephrolithiasis. Aortic Atherosclerosis (ICD10-I70.0). Electronically Signed   By: Jeannine Boga M.D.   On: 06/17/2022 17:51   CT Angio Chest PE W and/or Wo Contrast  Result Date: 06/17/2022 CLINICAL DATA:  Cough and congestion, back pain, tobacco abuse EXAM: CT ANGIOGRAPHY CHEST WITH CONTRAST TECHNIQUE: Multidetector CT imaging of the chest was performed using the standard protocol during bolus administration of intravenous contrast. Multiplanar CT image reconstructions and MIPs were obtained to evaluate the vascular anatomy. RADIATION DOSE REDUCTION: This exam was performed according to the departmental dose-optimization program which includes automated exposure control, adjustment of the mA and/or kV according to patient size and/or use of iterative reconstruction technique. CONTRAST:  29mL OMNIPAQUE IOHEXOL 350 MG/ML SOLN COMPARISON:  06/17/2022, 06/10/2022 FINDINGS: Cardiovascular: This is a technically adequate evaluation of the pulmonary vasculature.  No filling defects or pulmonary emboli. Postsurgical changes from CABG. No evidence of thoracic aortic aneurysm or dissection. Aortic atherosclerosis unchanged. Mediastinum/Nodes: Subcentimeter mediastinal and hilar lymph nodes are likely reactive. Thyroid, trachea, and esophagus are unremarkable. Lungs/Pleura: There is progressive airspace disease within the dependent right lower lobe, with new patchy airspace disease seen within the right middle lobe. Findings are consistent with worsening infection or aspiration. Progressive opacification of the left lower lobe bronchi is noted, which may reflect mucoid impaction or aspiration. Scattered tree in bud nodular airspace disease within the left lower lobe is new since prior exam. Extensive emphysema again noted. Small cavitary region within the right apex reference image 29/5 likely reflect sequela from previous infection, and is stable. No effusion or pneumothorax. Upper Abdomen: No acute abnormality. Musculoskeletal: There are no acute or destructive bony lesions. Reconstructed images demonstrate no additional findings. Review of the MIP images confirms the above findings. IMPRESSION: 1. No evidence of pulmonary embolus. 2. New and progressive airspace disease within the lung bases as above, consistent with worsening infection or aspiration. 3. Increased opacification of the left lower lobe bronchi which may reflect mucoid impaction versus aspiration. 4. Subcentimeter reactive mediastinal and hilar lymph nodes. 5. Aortic Atherosclerosis (ICD10-I70.0) and Emphysema (ICD10-J43.9). Electronically Signed   By: Randa Ngo M.D.   On: 06/17/2022 15:30   DG Chest 2 View  Result Date: 06/17/2022 CLINICAL DATA:  sob EXAM: CHEST - 2 VIEW COMPARISON:  June 10, 2022 FINDINGS: The cardiomediastinal silhouette is unchanged in contour.Status post median sternotomy and CABG. No pleural effusion. No pneumothorax. Emphysematous changes. Persistent RIGHT lower lobe  airspace opacities. This is similar comparison to prior. IMPRESSION: Persistent RIGHT lower lobe airspace opacities. This is favored to reflect infection. Electronically Signed   By: Valentino Saxon M.D.   On: 06/17/2022 11:43   DG Foot 2 Views Left  Result Date: 06/11/2022 CLINICAL DATA:  Pain, recent fall EXAM: LEFT FOOT - 2 VIEW COMPARISON:  None Available. FINDINGS: No displaced fracture or dislocation is seen. There is mild hallux valgus deformity. IMPRESSION: No recent fracture or dislocation is seen in left foot. Electronically Signed   By: Royston Cowper  Rathinasamy M.D.   On: 06/11/2022 13:05   DG Tibia/Fibula Left  Result Date: 06/11/2022 CLINICAL DATA:  Left lower leg pain, fall EXAM: LEFT TIBIA AND FIBULA - 2 VIEW COMPARISON:  None Available. FINDINGS: No acute bony abnormality. Specifically, no fracture, subluxation, or dislocation. Joint spaces maintained. Soft tissues are intact. Vascular calcifications noted. IMPRESSION: No acute bony abnormality. Electronically Signed   By: Rolm Baptise M.D.   On: 06/11/2022 13:03   DG Knee Left Port  Result Date: 06/10/2022 CLINICAL DATA:  Fall.  The swelling and bruising. EXAM: PORTABLE LEFT KNEE - 1-2 VIEW COMPARISON:  10/04/2016 FINDINGS: There is MEDIAL and patellofemoral compartment narrowing. No acute fracture or subluxation. No joint effusion. There is dense atherosclerotic calcification of the popliteal artery. IMPRESSION: Mild degenerative changes. No evidence for acute abnormality. Electronically Signed   By: Nolon Nations M.D.   On: 06/10/2022 09:31   CT CERVICAL SPINE WO CONTRAST  Result Date: 06/10/2022 CLINICAL DATA:  Fall with trauma to the head and neck. EXAM: CT CERVICAL SPINE WITHOUT CONTRAST TECHNIQUE: Multidetector CT imaging of the cervical spine was performed without intravenous contrast. Multiplanar CT image reconstructions were also generated. RADIATION DOSE REDUCTION: This exam was performed according to the departmental  dose-optimization program which includes automated exposure control, adjustment of the mA and/or kV according to patient size and/or use of iterative reconstruction technique. COMPARISON:  None Available. FINDINGS: Alignment: Mild rotation of C1 on C2 which is favored to be positional. If the patient can turn the head from right to left this would rule out rotatory subluxation, which is not favored. Skull base and vertebrae: No evidence of fracture or focal lesion. Soft tissues and spinal canal: Vascular calcification. No traumatic soft tissue finding. Disc levels: Mild degenerative spondylosis with endplate osteophytes at C4-5 and C6-7. Facet osteoarthritis worse on the right than the left. No apparent canal stenosis. Bony foraminal narrowing on the right at C3-4, C4-5 and C5-6. Upper chest: Negative acutely.  See results of chest CT. Other: None IMPRESSION: No acute or traumatic finding. Mild rotation of C1 on C2 which is favored to be positional. If the patient can turn the head from right to left, this would rule out rotatory subluxation, which is not favored. Electronically Signed   By: Nelson Chimes M.D.   On: 06/10/2022 09:29   CT MAXILLOFACIAL WO CONTRAST  Result Date: 06/10/2022 CLINICAL DATA:  Fall with trauma to the face. EXAM: CT MAXILLOFACIAL WITHOUT CONTRAST TECHNIQUE: Multidetector CT imaging of the maxillofacial structures was performed. Multiplanar CT image reconstructions were also generated. RADIATION DOSE REDUCTION: This exam was performed according to the departmental dose-optimization program which includes automated exposure control, adjustment of the mA and/or kV according to patient size and/or use of iterative reconstruction technique. COMPARISON:  None Available. FINDINGS: Osseous: Comminuted nasal fractures with slight depression on the right. Old plate and screw repair of a left maxillary fracture. No other acute facial finding. Nasal septum bows towards the left but no septal  fracture is established. Orbits: No orbital injury. Sinuses: No inflammatory or traumatic fluid present within the sinuses. Soft tissues: No other soft tissue injury evident. Limited intracranial: Negative IMPRESSION: 1. Comminuted nasal fractures with slight depression on the right. 2. Old plate and screw repair of a left maxillary fracture. Electronically Signed   By: Nelson Chimes M.D.   On: 06/10/2022 09:28   CT HEAD WO CONTRAST (5MM)  Result Date: 06/10/2022 CLINICAL DATA:  Fall with trauma to the head and face.  Anticoagulated. EXAM: CT HEAD WITHOUT CONTRAST TECHNIQUE: Contiguous axial images were obtained from the base of the skull through the vertex without intravenous contrast. RADIATION DOSE REDUCTION: This exam was performed according to the departmental dose-optimization program which includes automated exposure control, adjustment of the mA and/or kV according to patient size and/or use of iterative reconstruction technique. COMPARISON:  None Available. FINDINGS: Brain: Mild age related volume loss. No evidence of old or acute infarction, mass lesion, hemorrhage, hydrocephalus or extra-axial collection. Mild chronic small-vessel ischemic change of the cerebral hemispheric white matter, thalami and basal ganglia. Few scattered punctate benign calcifications are nonspecific and not likely significant presently. Vascular: There is atherosclerotic calcification of the major vessels at the base of the brain. Skull: Negative Sinuses/Orbits: Clear/normal Other: None IMPRESSION: No acute or traumatic finding. Mild age related volume loss. Mild chronic small-vessel ischemic change of the white matter, thalami and basal ganglia. Electronically Signed   By: Nelson Chimes M.D.   On: 06/10/2022 09:16   CT Chest W Contrast  Result Date: 06/10/2022 CLINICAL DATA:  Blunt trauma to the chest after a fall. EXAM: CT CHEST WITH CONTRAST TECHNIQUE: Multidetector CT imaging of the chest was performed during  intravenous contrast administration. RADIATION DOSE REDUCTION: This exam was performed according to the departmental dose-optimization program which includes automated exposure control, adjustment of the mA and/or kV according to patient size and/or use of iterative reconstruction technique. CONTRAST:  84mL OMNIPAQUE IOHEXOL 350 MG/ML SOLN COMPARISON:  10/03/2021 FINDINGS: Cardiovascular: Heart size is normal. Previous median sternotomy and CABG. Aortic atherosclerosis without aneurysm or dissection. No pulmonary emboli. Mediastinum/Nodes: No mediastinal or hilar mass or lymphadenopathy. Small mediastinal nodes are stable. Lungs/Pleura: Background emphysema and pulmonary scarring. A previously seen right upper lobe density anteriorly has not enlarged since a parole and areas that were solid-appearing previously now show some aeration. These findings suggest that this is benign bronchial disease and pulmonary scarring. Overall maximal dimension remains approximately 1.5 cm. Additional continued follow-up would be suggested. Elsewhere, previously seen right middle lobe collapse has resolved, with mild residual patchy density that could be scarring or minimal right middle lobe pneumonia presently. Fairly extensive patchy bronchopneumonia previously seen in the right lower lobe has improved. There is some persistent patchy density in that region with bronchial opacification that could be due to scarring, residual or recurrent right lower lobe bronchopneumonia. Follow-up to clearing would be recommended. No pleural effusion on either side. Upper Abdomen: No abdominal finding. Musculoskeletal: No spinal or rib fracture. IMPRESSION: 1. No acute traumatic finding. 2. Previous median sternotomy and CABG. Aortic atherosclerosis without aneurysm or dissection. 3. Background emphysema and pulmonary scarring. 4. Previously seen right middle lobe collapse has resolved, with mild residual patchy density that could be scarring or  minimal right middle lobe pneumonia presently. 5. Fairly extensive patchy bronchopneumonia previously seen in the right lower lobe has improved. There is some persistent patchy density in that region with bronchial opacification that could be due to scarring, residual or recurrent right lower lobe bronchopneumonia. Follow-up to clearing would be recommended. 6. A previously seen right upper lobe density anteriorly has not enlarged and areas that were solid-appearing previously show some aeration. These findings suggest that this is benign bronchial disease and pulmonary scarring. Overall maximal dimension remains approximately 1.5 cm. Additional/continued follow-up would be suggested. Aortic Atherosclerosis (ICD10-I70.0) and Emphysema (ICD10-J43.9). Electronically Signed   By: Nelson Chimes M.D.   On: 06/10/2022 09:15   DG Pelvis Portable  Result Date: 06/10/2022 CLINICAL DATA:  88 year old male status post fall. EXAM: PORTABLE PELVIS 1-2 VIEWS COMPARISON:  CT Abdomen and Pelvis 11/05/2017. FINDINGS: Portable AP semi upright view at 0835 hours. Femoral heads remain normally located. Left lateral iliac wing not completely included, but visible pelvis appears stable and intact. Grossly intact proximal femurs. Bulky iliofemoral calcified atherosclerosis. Negative visible bowel gas. IMPRESSION: 1. No acute fracture or dislocation identified about the pelvis. 2. Calcified atherosclerosis. Electronically Signed   By: Genevie Ann M.D.   On: 06/10/2022 08:50   DG CHEST PORT 1 VIEW  Result Date: 06/10/2022 CLINICAL DATA:  Fall EXAM: PORTABLE CHEST 1 VIEW COMPARISON:  02/21/2022 IMPRESSION: Hyperinflated lungs with pulmonary vascular congestion. Electronically Signed   By: Sammie Bench M.D.   On: 06/10/2022 08:49    Microbiology: Recent Results (from the past 240 hour(s))  Culture, blood (Routine X 2) w Reflex to ID Panel     Status: None   Collection Time: 06/23/22  9:00 AM   Specimen: BLOOD  Result Value Ref  Range Status   Specimen Description BLOOD BLOOD LEFT HAND  Final   Special Requests   Final    BOTTLES DRAWN AEROBIC AND ANAEROBIC Blood Culture adequate volume   Culture   Final    NO GROWTH 5 DAYS Performed at [facility], [address] [place] [address], [place], [place] [address]    Report Status 06/28/2022 FINAL  Final  Culture, blood (Routine X 2) w Reflex to ID Panel     Status: None   Collection Time: 06/23/22  9:02 AM   Specimen: BLOOD  Result Value Ref Range Status   Specimen Description BLOOD LFOA  Final   Special Requests   Final    BOTTLES DRAWN AEROBIC AND ANAEROBIC Blood Culture adequate volume   Culture   Final    NO GROWTH 5 DAYS Performed at [facility], [address], [place], [place] [address]    Report Status 06/28/2022 FINAL  Final  MRSA Next Gen by PCR, Nasal     Status: None   Collection Time: 06/25/22 12:29 PM   Specimen: Nasal Mucosa; Nasal Swab  Result Value Ref Range Status   MRSA by PCR Next Gen NOT DETECTED NOT DETECTED Final    Comment: (NOTE) The GeneXpert MRSA Assay (FDA approved for NASAL specimens only), is one component of a comprehensive MRSA colonization surveillance program. It is not intended to diagnose MRSA infection nor to guide or monitor treatment for MRSA infections. Test performance is not FDA approved in patients less than 88 years old. Performed at [facility], [address] [place], [place] [address]      Labs: Basic Metabolic Panel: Recent Labs  Lab 06/23/22 0554 06/24/22 0508 06/25/22 0603 06/27/22 0555 06/28/22 0724  NA 138 138  --  138 137  K 4.1 3.8  --  4.8 4.7  CL 99 100  --  103 105  CO2 28 28  --  20* 21*  GLUCOSE 232* 104*  --  173* 137*  BUN 59* 53*  --  49* 51*  CREATININE 1.55* 1.22 1.24 1.50* 1.33*  CALCIUM 8.3* 8.2*  --  8.5* 8.4*   Liver Function Tests: Recent Labs  Lab 06/24/22 0508  AST 18  ALT 15  ALKPHOS 57  BILITOT 0.9  PROT 6.9  ALBUMIN 2.5*   No results for  input(s): "LIPASE", "AMYLASE" in the last 168 hours. No results for input(s): "AMMONIA" in the last 168 hours. CBC: Recent Labs  Lab 06/23/22 0554  06/24/22 0508 06/27/22 0555 06/28/22 0724  WBC 22.2* 22.6* 14.9* 13.2*  HGB 11.3* 11.7* 11.2* 11.7*  HCT 34.1* 36.2* 35.4* 36.8*  MCV 86.5 87.4 89.8 88.9  PLT 403* 409* 370 368   Cardiac Enzymes: No results for input(s): "CKTOTAL", "CKMB", "CKMBINDEX", "TROPONINI" in the last 168 hours. BNP: BNP (last 3 results) Recent Labs    06/17/22 1043 06/20/22 0211 06/21/22 0847  BNP 172.2* 741.0* 210.6*   ProBNP (last 3 results) No results for input(s): "PROBNP" in the last 8760 hours.  CBG: Recent Labs  Lab 06/28/22 2054 06/29/22 0014 06/29/22 0058 06/29/22 0341 06/29/22 0749  GLUCAP 147* 56* 101* 116* 117*   Time spent: > 30 minutes were spent in preparing this discharge including medication reconciliation, counseling, and coordination of care.  Signed:  Tremar Wickens Marry Guan, MD  [facility] 06/29/2022, 9:58 AM

## 2022-06-29 NOTE — Plan of Care (Signed)
  Problem: Education: Goal: Knowledge of disease or condition will improve Outcome: Not Progressing Goal: Knowledge of the prescribed therapeutic regimen will improve Outcome: Not Progressing Goal: Individualized Educational Video(s) Outcome: Not Progressing   Problem: Activity: Goal: Ability to tolerate increased activity will improve Outcome: Not Progressing Goal: Will verbalize the importance of balancing activity with adequate rest periods Outcome: Not Progressing   Problem: Respiratory: Goal: Ability to maintain a clear airway will improve Outcome: Not Progressing Goal: Levels of oxygenation will improve Outcome: Not Progressing Goal: Ability to maintain adequate ventilation will improve Outcome: Not Progressing   Problem: Activity: Goal: Ability to tolerate increased activity will improve Outcome: Not Progressing   Problem: Clinical Measurements: Goal: Ability to maintain a body temperature in the normal range will improve Outcome: Not Progressing   Problem: Respiratory: Goal: Ability to maintain adequate ventilation will improve Outcome: Not Progressing Goal: Ability to maintain a clear airway will improve Outcome: Not Progressing   Problem: Education: Goal: Ability to describe self-care measures that may prevent or decrease complications (Diabetes Survival Skills Education) will improve Outcome: Not Progressing Goal: Individualized Educational Video(s) Outcome: Not Progressing   Problem: Coping: Goal: Ability to adjust to condition or change in health will improve Outcome: Not Progressing   Problem: Fluid Volume: Goal: Ability to maintain a balanced intake and output will improve Outcome: Not Progressing   Problem: Health Behavior/Discharge Planning: Goal: Ability to identify and utilize available resources and services will improve Outcome: Not Progressing Goal: Ability to manage health-related needs will improve Outcome: Not Progressing   Problem:  Metabolic: Goal: Ability to maintain appropriate glucose levels will improve Outcome: Not Progressing   Problem: Nutritional: Goal: Maintenance of adequate nutrition will improve Outcome: Not Progressing Goal: Progress toward achieving an optimal weight will improve Outcome: Not Progressing   Problem: Skin Integrity: Goal: Risk for impaired skin integrity will decrease Outcome: Not Progressing   Problem: Tissue Perfusion: Goal: Adequacy of tissue perfusion will improve Outcome: Not Progressing   Problem: Education: Goal: Knowledge of General Education information will improve Description: Including pain rating scale, medication(s)/side effects and non-pharmacologic comfort measures Outcome: Not Progressing   Problem: Health Behavior/Discharge Planning: Goal: Ability to manage health-related needs will improve Outcome: Not Progressing   Problem: Clinical Measurements: Goal: Ability to maintain clinical measurements within normal limits will improve Outcome: Not Progressing Goal: Will remain free from infection Outcome: Not Progressing Goal: Diagnostic test results will improve Outcome: Not Progressing Goal: Respiratory complications will improve Outcome: Not Progressing Goal: Cardiovascular complication will be avoided Outcome: Not Progressing   Problem: Activity: Goal: Risk for activity intolerance will decrease Outcome: Not Progressing   Problem: Nutrition: Goal: Adequate nutrition will be maintained Outcome: Not Progressing   Problem: Coping: Goal: Level of anxiety will decrease Outcome: Not Progressing   Problem: Elimination: Goal: Will not experience complications related to bowel motility Outcome: Not Progressing Goal: Will not experience complications related to urinary retention Outcome: Not Progressing   Problem: Pain Managment: Goal: General experience of comfort will improve Outcome: Not Progressing   Problem: Safety: Goal: Ability to remain  free from injury will improve Outcome: Not Progressing   Problem: Skin Integrity: Goal: Risk for impaired skin integrity will decrease Outcome: Not Progressing

## 2022-06-29 NOTE — Plan of Care (Signed)
Problem: Education: Goal: Knowledge of disease or condition will improve 06/29/2022 1100 by Shauna Hugh, RN Outcome: Adequate for Discharge 06/29/2022 0800 by Shauna Hugh, RN Outcome: Not Progressing Goal: Knowledge of the prescribed therapeutic regimen will improve 06/29/2022 1100 by Shauna Hugh, RN Outcome: Adequate for Discharge 06/29/2022 0800 by Shauna Hugh, RN Outcome: Not Progressing Goal: Individualized Educational Video(s) 06/29/2022 1100 by Shauna Hugh, RN Outcome: Adequate for Discharge 06/29/2022 0800 by Shauna Hugh, RN Outcome: Not Progressing   Problem: Activity: Goal: Ability to tolerate increased activity will improve 06/29/2022 1100 by Shauna Hugh, RN Outcome: Adequate for Discharge 06/29/2022 0800 by Shauna Hugh, RN Outcome: Not Progressing Goal: Will verbalize the importance of balancing activity with adequate rest periods 06/29/2022 1100 by Shauna Hugh, RN Outcome: Adequate for Discharge 06/29/2022 0800 by Shauna Hugh, RN Outcome: Not Progressing   Problem: Respiratory: Goal: Ability to maintain a clear airway will improve 06/29/2022 1100 by Shauna Hugh, RN Outcome: Adequate for Discharge 06/29/2022 0800 by Shauna Hugh, RN Outcome: Not Progressing Goal: Levels of oxygenation will improve 06/29/2022 1100 by Shauna Hugh, RN Outcome: Adequate for Discharge 06/29/2022 0800 by Shauna Hugh, RN Outcome: Not Progressing Goal: Ability to maintain adequate ventilation will improve 06/29/2022 1100 by Shauna Hugh, RN Outcome: Adequate for Discharge 06/29/2022 0800 by Shauna Hugh, RN Outcome: Not Progressing   Problem: Activity: Goal: Ability to tolerate increased activity will improve 06/29/2022 1100 by Shauna Hugh, RN Outcome: Adequate for Discharge 06/29/2022 0800 by Shauna Hugh, RN Outcome: Not Progressing   Problem: Clinical Measurements: Goal: Ability to maintain a body temperature in the normal range will improve 06/29/2022 1100 by Shauna Hugh,  RN Outcome: Adequate for Discharge 06/29/2022 0800 by Shauna Hugh, RN Outcome: Not Progressing   Problem: Respiratory: Goal: Ability to maintain adequate ventilation will improve 06/29/2022 1100 by Shauna Hugh, RN Outcome: Adequate for Discharge 06/29/2022 0800 by Shauna Hugh, RN Outcome: Not Progressing Goal: Ability to maintain a clear airway will improve 06/29/2022 1100 by Shauna Hugh, RN Outcome: Adequate for Discharge 06/29/2022 0800 by Shauna Hugh, RN Outcome: Not Progressing   Problem: Education: Goal: Ability to describe self-care measures that may prevent or decrease complications (Diabetes Survival Skills Education) will improve 06/29/2022 1100 by Shauna Hugh, RN Outcome: Adequate for Discharge 06/29/2022 0800 by Shauna Hugh, RN Outcome: Not Progressing Goal: Individualized Educational Video(s) 06/29/2022 1100 by Shauna Hugh, RN Outcome: Adequate for Discharge 06/29/2022 0800 by Shauna Hugh, RN Outcome: Not Progressing   Problem: Coping: Goal: Ability to adjust to condition or change in health will improve 06/29/2022 1100 by Shauna Hugh, RN Outcome: Adequate for Discharge 06/29/2022 0800 by Shauna Hugh, RN Outcome: Not Progressing   Problem: Fluid Volume: Goal: Ability to maintain a balanced intake and output will improve 06/29/2022 1100 by Shauna Hugh, RN Outcome: Adequate for Discharge 06/29/2022 0800 by Shauna Hugh, RN Outcome: Not Progressing   Problem: Health Behavior/Discharge Planning: Goal: Ability to identify and utilize available resources and services will improve 06/29/2022 1100 by Shauna Hugh, RN Outcome: Adequate for Discharge 06/29/2022 0800 by Shauna Hugh, RN Outcome: Not Progressing Goal: Ability to manage health-related needs will improve 06/29/2022 1100 by Shauna Hugh, RN Outcome: Adequate for Discharge 06/29/2022 0800 by Shauna Hugh, RN Outcome: Not Progressing   Problem: Metabolic: Goal: Ability to maintain appropriate glucose  levels will improve 06/29/2022 1100 by Shauna Hugh, RN Outcome: Adequate for Discharge 06/29/2022 0800 by Shauna Hugh, RN Outcome: Not Progressing   Problem: Nutritional: Goal: Maintenance of adequate nutrition will improve 06/29/2022 1100 by Juleen China,  Garreth Burnsworth, RN Outcome: Adequate for Discharge 06/29/2022 0800 by Shauna Hugh, RN Outcome: Not Progressing Goal: Progress toward achieving an optimal weight will improve 06/29/2022 1100 by Shauna Hugh, RN Outcome: Adequate for Discharge 06/29/2022 0800 by Shauna Hugh, RN Outcome: Not Progressing   Problem: Skin Integrity: Goal: Risk for impaired skin integrity will decrease 06/29/2022 1100 by Shauna Hugh, RN Outcome: Adequate for Discharge 06/29/2022 0800 by Shauna Hugh, RN Outcome: Not Progressing   Problem: Tissue Perfusion: Goal: Adequacy of tissue perfusion will improve 06/29/2022 1100 by Shauna Hugh, RN Outcome: Adequate for Discharge 06/29/2022 0800 by Shauna Hugh, RN Outcome: Not Progressing   Problem: Education: Goal: Knowledge of General Education information will improve Description: Including pain rating scale, medication(s)/side effects and non-pharmacologic comfort measures 06/29/2022 1100 by Shauna Hugh, RN Outcome: Adequate for Discharge 06/29/2022 0800 by Shauna Hugh, RN Outcome: Not Progressing   Problem: Health Behavior/Discharge Planning: Goal: Ability to manage health-related needs will improve 06/29/2022 1100 by Shauna Hugh, RN Outcome: Adequate for Discharge 06/29/2022 0800 by Shauna Hugh, RN Outcome: Not Progressing   Problem: Clinical Measurements: Goal: Ability to maintain clinical measurements within normal limits will improve 06/29/2022 1100 by Shauna Hugh, RN Outcome: Adequate for Discharge 06/29/2022 0800 by Shauna Hugh, RN Outcome: Not Progressing Goal: Will remain free from infection 06/29/2022 1100 by Shauna Hugh, RN Outcome: Adequate for Discharge 06/29/2022 0800 by Shauna Hugh,  RN Outcome: Not Progressing Goal: Diagnostic test results will improve 06/29/2022 1100 by Shauna Hugh, RN Outcome: Adequate for Discharge 06/29/2022 0800 by Shauna Hugh, RN Outcome: Not Progressing Goal: Respiratory complications will improve 06/29/2022 1100 by Shauna Hugh, RN Outcome: Adequate for Discharge 06/29/2022 0800 by Shauna Hugh, RN Outcome: Not Progressing Goal: Cardiovascular complication will be avoided 06/29/2022 1100 by Shauna Hugh, RN Outcome: Adequate for Discharge 06/29/2022 0800 by Shauna Hugh, RN Outcome: Not Progressing   Problem: Activity: Goal: Risk for activity intolerance will decrease 06/29/2022 1100 by Shauna Hugh, RN Outcome: Adequate for Discharge 06/29/2022 0800 by Shauna Hugh, RN Outcome: Not Progressing   Problem: Nutrition: Goal: Adequate nutrition will be maintained 06/29/2022 1100 by Shauna Hugh, RN Outcome: Adequate for Discharge 06/29/2022 0800 by Shauna Hugh, RN Outcome: Not Progressing   Problem: Coping: Goal: Level of anxiety will decrease 06/29/2022 1100 by Shauna Hugh, RN Outcome: Adequate for Discharge 06/29/2022 0800 by Shauna Hugh, RN Outcome: Not Progressing   Problem: Elimination: Goal: Will not experience complications related to bowel motility 06/29/2022 1100 by Shauna Hugh, RN Outcome: Adequate for Discharge 06/29/2022 0800 by Shauna Hugh, RN Outcome: Not Progressing Goal: Will not experience complications related to urinary retention 06/29/2022 1100 by Shauna Hugh, RN Outcome: Adequate for Discharge 06/29/2022 0800 by Shauna Hugh, RN Outcome: Not Progressing   Problem: Pain Managment: Goal: General experience of comfort will improve 06/29/2022 1100 by Shauna Hugh, RN Outcome: Adequate for Discharge 06/29/2022 0800 by Shauna Hugh, RN Outcome: Not Progressing   Problem: Safety: Goal: Ability to remain free from injury will improve 06/29/2022 1100 by Shauna Hugh, RN Outcome: Adequate for Discharge 06/29/2022  0800 by Shauna Hugh, RN Outcome: Not Progressing   Problem: Skin Integrity: Goal: Risk for impaired skin integrity will decrease 06/29/2022 1100 by Shauna Hugh, RN Outcome: Adequate for Discharge 06/29/2022 0800 by Shauna Hugh, RN Outcome: Not Progressing

## 2022-06-29 NOTE — TOC Transition Note (Signed)
Transition of Care [facility]) - CM/SW Discharge Note   Patient Details  Name: [name] MRN: [id number] Date of Birth: 1935/06/05  Transition of Care [facility] CM/SW Contact:  Izola Price, RN Phone Number: 06/29/2022, 9:49 AM   Clinical Narrative: 05/29/22: Patient to be discharged today to home with hospice via [place]. EMS forms excluding DNR form printed to unit and liaison will call for [facility] transport when everything in place for discharge. Corrected destination address placed on EMS forms with grandaughter listed as contact. Patient currently on 2L/[place].  Simmie Davies RN CM     Final next level of care: Home w Hospice Care Barriers to Discharge: Barriers Resolved   Patient Goals and CMS Choice      Discharge Placement                      Patient and family notified of of transfer: 06/29/22 (Per Hospice liaison from [facility]  Discharge Plan and Services Additional resources added to the After Visit Summary for                  DME Arranged: N/A (Hospice at home will arrange any additional DME. Requested a Bedside Table in notes.) DME Agency: NA       HH Arranged: NA HH Agency: [facility] (Home with Hospice via [place].)        Social Determinants of Health (SDOH) Interventions SDOH Screenings   Food Insecurity: No Food Insecurity (06/19/2022)  Housing: Low Risk  (06/19/2022)  Transportation Needs: No Transportation Needs (06/19/2022)  Utilities: Not At Risk (06/19/2022)  Alcohol Screen: Low Risk  (08/31/2021)  Depression (PHQ2-9): Low Risk  (02/21/2022)  Financial Resource Strain: Low Risk  (08/31/2021)  Physical Activity: Insufficiently Active (08/31/2021)  Social Connections: Moderately Isolated (08/31/2021)  Stress: No Stress Concern Present (08/31/2021)  Tobacco Use: High Risk (06/19/2022)     Readmission Risk Interventions    10/08/2021   12:04 PM  Readmission Risk Prevention Plan  Post Dischage Appt Complete   Medication Screening Complete  Transportation Screening Complete

## 2022-07-25 DEATH — deceased

## 2022-07-29 ENCOUNTER — Telehealth: Payer: Self-pay | Admitting: Cardiology

## 2022-07-29 NOTE — Telephone Encounter (Signed)
Will route to MD/RN.

## 2022-07-29 NOTE — Telephone Encounter (Signed)
Granddaughter stated patient passed away Jul 24, 2022.

## 2022-07-29 NOTE — Telephone Encounter (Signed)
Spoke with pt granddaughter, condolences given.

## 2022-08-26 ENCOUNTER — Ambulatory Visit: Payer: Medicare HMO | Admitting: Cardiology
